# Patient Record
Sex: Female | Born: 1980
Health system: Southern US, Community
[De-identification: ages and names within clinical notes are randomized; demographics above are authoritative.]

## PROBLEM LIST (undated history)

## (undated) DIAGNOSIS — T7840XA Allergy, unspecified, initial encounter: Secondary | ICD-10-CM

## (undated) DIAGNOSIS — M4800 Spinal stenosis, site unspecified: Secondary | ICD-10-CM

## (undated) DIAGNOSIS — K2970 Gastritis, unspecified, without bleeding: Secondary | ICD-10-CM

## (undated) DIAGNOSIS — E039 Hypothyroidism, unspecified: Secondary | ICD-10-CM

## (undated) DIAGNOSIS — M109 Gout, unspecified: Secondary | ICD-10-CM

## (undated) DIAGNOSIS — E049 Nontoxic goiter, unspecified: Secondary | ICD-10-CM

## (undated) DIAGNOSIS — F32A Depression, unspecified: Secondary | ICD-10-CM

## (undated) DIAGNOSIS — G43909 Migraine, unspecified, not intractable, without status migrainosus: Secondary | ICD-10-CM

## (undated) DIAGNOSIS — F329 Major depressive disorder, single episode, unspecified: Secondary | ICD-10-CM

## (undated) DIAGNOSIS — F324 Major depressive disorder, single episode, in partial remission: Secondary | ICD-10-CM

## (undated) DIAGNOSIS — F419 Anxiety disorder, unspecified: Secondary | ICD-10-CM

## (undated) DIAGNOSIS — E66811 Obesity, class 1: Secondary | ICD-10-CM

## (undated) DIAGNOSIS — M797 Fibromyalgia: Secondary | ICD-10-CM

## (undated) DIAGNOSIS — E669 Obesity, unspecified: Secondary | ICD-10-CM

## (undated) DIAGNOSIS — M1990 Unspecified osteoarthritis, unspecified site: Secondary | ICD-10-CM

## (undated) DIAGNOSIS — K219 Gastro-esophageal reflux disease without esophagitis: Secondary | ICD-10-CM

## (undated) DIAGNOSIS — I10 Essential (primary) hypertension: Secondary | ICD-10-CM

## (undated) HISTORY — DX: Obesity, class 1: E66.811

## (undated) HISTORY — DX: Obesity, unspecified: E66.9

## (undated) HISTORY — DX: Spinal stenosis, site unspecified: M48.00

## (undated) HISTORY — DX: Depression, unspecified: F32.A

## (undated) HISTORY — DX: Essential (primary) hypertension: I10

## (undated) HISTORY — DX: Gastritis, unspecified, without bleeding: K29.70

## (undated) HISTORY — PX: INCISION AND DRAINAGE ABSCESS: SHX5864

## (undated) HISTORY — DX: Anxiety disorder, unspecified: F41.9

## (undated) HISTORY — DX: Migraine, unspecified, not intractable, without status migrainosus: G43.909

## (undated) HISTORY — DX: Nontoxic goiter, unspecified: E04.9

## (undated) HISTORY — DX: Allergy, unspecified, initial encounter: T78.40XA

## (undated) HISTORY — DX: Major depressive disorder, single episode, unspecified: F32.9

---
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
## 2008-10-16 ENCOUNTER — Telehealth: Payer: Self-pay | Admitting: Family Medicine

## 2008-10-18 ENCOUNTER — Ambulatory Visit: Payer: Self-pay | Admitting: Family Medicine

## 2008-10-18 DIAGNOSIS — F324 Major depressive disorder, single episode, in partial remission: Secondary | ICD-10-CM | POA: Insufficient documentation

## 2008-10-18 HISTORY — DX: Major depressive disorder, single episode, in partial remission: F32.4

## 2008-10-18 LAB — CONVERTED CEMR LAB
Beta hcg, urine, semiquantitative: NEGATIVE
CO2: 22 meq/L (ref 19–32)
Calcium: 9.8 mg/dL (ref 8.4–10.5)
Chloride: 105 meq/L (ref 96–112)
Creatinine, Ser: 0.66 mg/dL (ref 0.40–1.20)
HDL: 53 mg/dL (ref >39)
LDL Cholesterol: 72 mg/dL (ref 0–99)
Lymphs Abs: 2.3 10*3/uL (ref 0.7–4.0)
Monocytes Relative: 8 % (ref 3–12)
Neutro Abs: 3.3 10*3/uL (ref 1.7–7.7)
Neutrophils Relative %: 53 % (ref 43–77)
Platelets: 454 10*3/uL — ABNORMAL HIGH (ref 150–400)
RBC: 4.76 M/uL (ref 3.87–5.11)
Sodium: 141 meq/L (ref 135–145)
TSH: 0.617 microintl units/mL (ref 0.350–4.500)
Total CHOL/HDL Ratio: 2.6
Triglycerides: 77 mg/dL (ref <150)
WBC: 6.1 10*3/uL (ref 4.0–10.5)

## 2008-10-20 ENCOUNTER — Ambulatory Visit (HOSPITAL_COMMUNITY): Admission: RE | Admit: 2008-10-20 | Discharge: 2008-10-20 | Payer: Self-pay | Admitting: Family Medicine

## 2009-01-21 ENCOUNTER — Emergency Department (HOSPITAL_COMMUNITY): Admission: EM | Admit: 2009-01-21 | Discharge: 2009-01-21 | Payer: Self-pay | Admitting: Emergency Medicine

## 2009-03-07 ENCOUNTER — Encounter: Payer: Self-pay | Admitting: Family Medicine

## 2009-03-07 ENCOUNTER — Ambulatory Visit: Payer: Self-pay | Admitting: Family Medicine

## 2009-03-07 ENCOUNTER — Other Ambulatory Visit: Admission: RE | Admit: 2009-03-07 | Discharge: 2009-03-07 | Payer: Self-pay | Admitting: Family Medicine

## 2009-03-07 DIAGNOSIS — R5381 Other malaise: Secondary | ICD-10-CM | POA: Insufficient documentation

## 2009-03-07 DIAGNOSIS — M25569 Pain in unspecified knee: Secondary | ICD-10-CM

## 2009-03-07 DIAGNOSIS — R5383 Other fatigue: Secondary | ICD-10-CM

## 2009-03-07 LAB — CONVERTED CEMR LAB: Chlamydia, DNA Probe: NEGATIVE

## 2009-03-07 LAB — HM PAP SMEAR

## 2009-03-08 ENCOUNTER — Encounter: Payer: Self-pay | Admitting: Family Medicine

## 2009-03-08 ENCOUNTER — Telehealth: Payer: Self-pay | Admitting: Family Medicine

## 2009-03-08 DIAGNOSIS — F17200 Nicotine dependence, unspecified, uncomplicated: Secondary | ICD-10-CM | POA: Insufficient documentation

## 2009-03-08 LAB — CONVERTED CEMR LAB: Gardnerella vaginalis: POSITIVE — AB

## 2009-03-09 ENCOUNTER — Encounter: Payer: Self-pay | Admitting: Family Medicine

## 2009-03-21 ENCOUNTER — Ambulatory Visit (HOSPITAL_COMMUNITY): Payer: Self-pay | Admitting: Psychiatry

## 2009-03-30 ENCOUNTER — Ambulatory Visit: Payer: Self-pay | Admitting: Family Medicine

## 2009-03-30 DIAGNOSIS — T887XXA Unspecified adverse effect of drug or medicament, initial encounter: Secondary | ICD-10-CM | POA: Insufficient documentation

## 2009-04-05 ENCOUNTER — Telehealth: Payer: Self-pay | Admitting: Family Medicine

## 2009-04-23 ENCOUNTER — Ambulatory Visit: Payer: Self-pay | Admitting: Orthopedic Surgery

## 2009-04-23 DIAGNOSIS — M24469 Recurrent dislocation, unspecified knee: Secondary | ICD-10-CM

## 2009-04-25 ENCOUNTER — Telehealth: Payer: Self-pay | Admitting: Orthopedic Surgery

## 2009-04-30 ENCOUNTER — Encounter: Payer: Self-pay | Admitting: Orthopedic Surgery

## 2009-04-30 ENCOUNTER — Encounter (HOSPITAL_COMMUNITY): Admission: RE | Admit: 2009-04-30 | Discharge: 2009-05-30 | Payer: Self-pay | Admitting: Orthopedic Surgery

## 2009-06-01 ENCOUNTER — Encounter (HOSPITAL_COMMUNITY): Admission: RE | Admit: 2009-06-01 | Discharge: 2009-07-01 | Payer: Self-pay | Admitting: Orthopedic Surgery

## 2009-06-07 ENCOUNTER — Ambulatory Visit: Payer: Self-pay | Admitting: Family Medicine

## 2009-06-16 DIAGNOSIS — J309 Allergic rhinitis, unspecified: Secondary | ICD-10-CM | POA: Insufficient documentation

## 2009-07-28 HISTORY — PX: UPPER GI ENDOSCOPY: SHX6162

## 2009-07-31 ENCOUNTER — Ambulatory Visit: Payer: Self-pay | Admitting: Family Medicine

## 2009-08-01 ENCOUNTER — Encounter: Payer: Self-pay | Admitting: Orthopedic Surgery

## 2009-08-16 ENCOUNTER — Telehealth: Payer: Self-pay | Admitting: Family Medicine

## 2009-09-12 ENCOUNTER — Ambulatory Visit: Payer: Self-pay | Admitting: Family Medicine

## 2009-09-12 ENCOUNTER — Telehealth: Payer: Self-pay | Admitting: Family Medicine

## 2009-09-12 DIAGNOSIS — K5900 Constipation, unspecified: Secondary | ICD-10-CM | POA: Insufficient documentation

## 2009-09-23 ENCOUNTER — Emergency Department (HOSPITAL_COMMUNITY): Admission: EM | Admit: 2009-09-23 | Discharge: 2009-09-23 | Payer: Self-pay | Admitting: Emergency Medicine

## 2009-10-19 ENCOUNTER — Telehealth: Payer: Self-pay | Admitting: Family Medicine

## 2009-10-22 ENCOUNTER — Ambulatory Visit: Payer: Self-pay | Admitting: Family Medicine

## 2009-10-22 ENCOUNTER — Encounter (INDEPENDENT_AMBULATORY_CARE_PROVIDER_SITE_OTHER): Payer: Self-pay

## 2009-10-22 DIAGNOSIS — R519 Headache, unspecified: Secondary | ICD-10-CM | POA: Insufficient documentation

## 2009-10-22 DIAGNOSIS — R51 Headache: Secondary | ICD-10-CM

## 2009-10-29 ENCOUNTER — Telehealth: Payer: Self-pay | Admitting: Family Medicine

## 2009-10-30 ENCOUNTER — Encounter (INDEPENDENT_AMBULATORY_CARE_PROVIDER_SITE_OTHER): Payer: Self-pay

## 2009-10-30 ENCOUNTER — Ambulatory Visit: Payer: Self-pay | Admitting: Family Medicine

## 2009-10-31 ENCOUNTER — Telehealth: Payer: Self-pay | Admitting: Family Medicine

## 2009-10-31 ENCOUNTER — Ambulatory Visit: Payer: Self-pay | Admitting: Family Medicine

## 2009-10-31 LAB — CONVERTED CEMR LAB: Rapid Strep: NEGATIVE

## 2009-11-07 ENCOUNTER — Telehealth: Payer: Self-pay | Admitting: Family Medicine

## 2009-11-14 ENCOUNTER — Telehealth: Payer: Self-pay | Admitting: Family Medicine

## 2009-11-14 ENCOUNTER — Encounter (INDEPENDENT_AMBULATORY_CARE_PROVIDER_SITE_OTHER): Payer: Self-pay

## 2009-11-14 ENCOUNTER — Ambulatory Visit: Payer: Self-pay | Admitting: Family Medicine

## 2009-11-15 ENCOUNTER — Encounter: Payer: Self-pay | Admitting: Family Medicine

## 2009-11-20 ENCOUNTER — Encounter: Payer: Self-pay | Admitting: Family Medicine

## 2009-11-21 ENCOUNTER — Encounter: Payer: Self-pay | Admitting: Orthopedic Surgery

## 2009-11-23 ENCOUNTER — Telehealth (INDEPENDENT_AMBULATORY_CARE_PROVIDER_SITE_OTHER): Payer: Self-pay | Admitting: *Deleted

## 2009-11-26 ENCOUNTER — Telehealth: Payer: Self-pay | Admitting: Family Medicine

## 2009-12-03 ENCOUNTER — Ambulatory Visit: Payer: Self-pay | Admitting: Family Medicine

## 2009-12-12 ENCOUNTER — Ambulatory Visit: Payer: Self-pay | Admitting: Family Medicine

## 2009-12-13 ENCOUNTER — Encounter: Payer: Self-pay | Admitting: Family Medicine

## 2009-12-14 LAB — CONVERTED CEMR LAB
CO2: 21 meq/L (ref 19–32)
Chloride: 107 meq/L (ref 96–112)
Eosinophils Absolute: 0.1 10*3/uL (ref 0.0–0.7)
HDL: 53 mg/dL (ref >39)
LDL Cholesterol: 101 mg/dL — ABNORMAL HIGH (ref 0–99)
Lymphocytes Relative: 48 % — ABNORMAL HIGH (ref 12–46)
Lymphs Abs: 3 10*3/uL (ref 0.7–4.0)
MCV: 90.7 fL (ref 78.0–100.0)
Monocytes Relative: 4 % (ref 3–12)
Neutrophils Relative %: 47 % (ref 43–77)
Platelets: 367 10*3/uL (ref 150–400)
Potassium: 3.9 meq/L (ref 3.5–5.3)
RBC: 4.53 M/uL (ref 3.87–5.11)
Sodium: 140 meq/L (ref 135–145)
Total CHOL/HDL Ratio: 3.1
Triglycerides: 49 mg/dL (ref <150)
VLDL: 10 mg/dL (ref 0–40)
Vit D, 25-Hydroxy: 19 ng/mL — ABNORMAL LOW (ref 30–89)
WBC: 6.3 10*3/uL (ref 4.0–10.5)

## 2009-12-17 ENCOUNTER — Ambulatory Visit (HOSPITAL_COMMUNITY): Admission: RE | Admit: 2009-12-17 | Discharge: 2009-12-17 | Payer: Self-pay | Admitting: Family Medicine

## 2010-01-09 ENCOUNTER — Encounter: Payer: Self-pay | Admitting: Family Medicine

## 2010-01-24 ENCOUNTER — Telehealth: Payer: Self-pay | Admitting: Family Medicine

## 2010-01-25 ENCOUNTER — Telehealth: Payer: Self-pay | Admitting: Family Medicine

## 2010-02-22 ENCOUNTER — Telehealth: Payer: Self-pay | Admitting: Family Medicine

## 2010-03-04 ENCOUNTER — Ambulatory Visit: Payer: Self-pay | Admitting: Family Medicine

## 2010-03-28 ENCOUNTER — Other Ambulatory Visit: Admission: RE | Admit: 2010-03-28 | Discharge: 2010-03-28 | Payer: Self-pay | Admitting: Family Medicine

## 2010-03-28 ENCOUNTER — Ambulatory Visit: Payer: Self-pay | Admitting: Family Medicine

## 2010-03-28 DIAGNOSIS — K589 Irritable bowel syndrome without diarrhea: Secondary | ICD-10-CM

## 2010-03-29 ENCOUNTER — Encounter: Payer: Self-pay | Admitting: Family Medicine

## 2010-03-29 LAB — CONVERTED CEMR LAB: Gardnerella vaginalis: NEGATIVE

## 2010-04-02 ENCOUNTER — Ambulatory Visit: Payer: Self-pay | Admitting: Family Medicine

## 2010-04-02 DIAGNOSIS — G43909 Migraine, unspecified, not intractable, without status migrainosus: Secondary | ICD-10-CM

## 2010-04-03 ENCOUNTER — Encounter: Payer: Self-pay | Admitting: Family Medicine

## 2010-04-03 LAB — CONVERTED CEMR LAB: Pap Smear: NEGATIVE

## 2010-04-05 LAB — CONVERTED CEMR LAB: Helicobacter Pylori Antibody-IgG: <0.4

## 2010-04-10 ENCOUNTER — Telehealth (INDEPENDENT_AMBULATORY_CARE_PROVIDER_SITE_OTHER): Payer: Self-pay | Admitting: *Deleted

## 2010-04-12 ENCOUNTER — Encounter: Payer: Self-pay | Admitting: Family Medicine

## 2010-04-16 ENCOUNTER — Ambulatory Visit (HOSPITAL_COMMUNITY): Admission: RE | Admit: 2010-04-16 | Discharge: 2010-04-16 | Payer: Self-pay | Admitting: Family Medicine

## 2010-04-16 ENCOUNTER — Telehealth: Payer: Self-pay | Admitting: Family Medicine

## 2010-05-01 ENCOUNTER — Telehealth: Payer: Self-pay | Admitting: Family Medicine

## 2010-05-02 ENCOUNTER — Ambulatory Visit (HOSPITAL_COMMUNITY): Admission: RE | Admit: 2010-05-02 | Discharge: 2010-05-02 | Payer: Self-pay | Admitting: Family Medicine

## 2010-05-02 ENCOUNTER — Telehealth: Payer: Self-pay | Admitting: Family Medicine

## 2010-05-16 ENCOUNTER — Ambulatory Visit: Payer: Self-pay | Admitting: Gastroenterology

## 2010-05-27 ENCOUNTER — Ambulatory Visit: Payer: Self-pay | Admitting: Family Medicine

## 2010-05-28 DIAGNOSIS — K2970 Gastritis, unspecified, without bleeding: Secondary | ICD-10-CM

## 2010-05-28 HISTORY — DX: Gastritis, unspecified, without bleeding: K29.70

## 2010-05-29 ENCOUNTER — Telehealth: Payer: Self-pay | Admitting: Family Medicine

## 2010-06-03 ENCOUNTER — Ambulatory Visit (HOSPITAL_COMMUNITY): Admission: RE | Admit: 2010-06-03 | Discharge: 2010-06-03 | Payer: Self-pay | Admitting: Gastroenterology

## 2010-06-26 ENCOUNTER — Encounter: Payer: Self-pay | Admitting: Family Medicine

## 2010-06-28 ENCOUNTER — Ambulatory Visit: Payer: Self-pay | Admitting: Family Medicine

## 2010-06-28 DIAGNOSIS — B369 Superficial mycosis, unspecified: Secondary | ICD-10-CM | POA: Insufficient documentation

## 2010-07-01 ENCOUNTER — Encounter: Payer: Self-pay | Admitting: Family Medicine

## 2010-07-02 ENCOUNTER — Ambulatory Visit: Payer: Self-pay | Admitting: Family Medicine

## 2010-07-02 DIAGNOSIS — J029 Acute pharyngitis, unspecified: Secondary | ICD-10-CM | POA: Insufficient documentation

## 2010-07-16 ENCOUNTER — Encounter: Payer: Self-pay | Admitting: Family Medicine

## 2010-07-17 ENCOUNTER — Ambulatory Visit: Payer: Self-pay | Admitting: Family Medicine

## 2010-07-31 ENCOUNTER — Encounter (INDEPENDENT_AMBULATORY_CARE_PROVIDER_SITE_OTHER): Payer: Self-pay | Admitting: *Deleted

## 2010-08-12 ENCOUNTER — Ambulatory Visit
Admission: AD | Admit: 2010-08-12 | Discharge: 2010-08-12 | Payer: Self-pay | Source: Home / Self Care | Attending: Neurology | Admitting: Neurology

## 2010-08-18 ENCOUNTER — Encounter: Payer: Self-pay | Admitting: Family Medicine

## 2010-08-19 ENCOUNTER — Telehealth: Payer: Self-pay | Admitting: Family Medicine

## 2010-08-19 ENCOUNTER — Ambulatory Visit
Admission: RE | Admit: 2010-08-19 | Discharge: 2010-08-19 | Payer: Self-pay | Source: Home / Self Care | Attending: Family Medicine | Admitting: Family Medicine

## 2010-08-20 ENCOUNTER — Encounter: Payer: Self-pay | Admitting: Family Medicine

## 2010-08-22 ENCOUNTER — Ambulatory Visit
Admission: RE | Admit: 2010-08-22 | Discharge: 2010-08-22 | Payer: Self-pay | Source: Home / Self Care | Attending: Internal Medicine | Admitting: Internal Medicine

## 2010-08-22 DIAGNOSIS — K2990 Gastroduodenitis, unspecified, without bleeding: Secondary | ICD-10-CM

## 2010-08-22 DIAGNOSIS — K2970 Gastritis, unspecified, without bleeding: Secondary | ICD-10-CM | POA: Insufficient documentation

## 2010-08-27 NOTE — Progress Notes (Signed)
Summary: headache  Phone Note Call from Patient   Summary of Call: PATIENT called in and states she has a really bad headache wanted to know if something could be called in for it, [facility] patient on phone.  Initial call taken by: Curtis Sites,  February 22, 2010 11:00 AM  Follow-up for Phone Call        states she is having a migraine and she is at work and is outof her meds. Her vision is blurry and that happens when she gets a bad one. I sent in one fill of her meds and told her to make app for next week  Follow-up by: Everitt Amber LPN,  February 22, 2010 11:02 AM

## 2010-08-27 NOTE — Assessment & Plan Note (Signed)
Summary: depo  Nurse Visit   Allergies: 1)  ! Codeine 2)  ! * Soy  Medication Administration  Injection # 1:    Medication: Depo-Provera 150mg     Diagnosis: CONTRACEPTIVE MANAGEMENT (ICD-V25.09)    Route: IM    Site: LUOQ gluteus    Exp Date: 01/14    Lot #: [id number]    Mfr: GREENSTONE    Patient tolerated injection without complications    Given by: Adella Hare LPN (May 27, 2010 11:18 AM)  Orders Added: 1)  Depo-Provera 150mg  [J1055] 2)  Admin of Therapeutic Inj  intramuscular or subcutaneous [96372]   Medication Administration  Injection # 1:    Medication: Depo-Provera 150mg     Diagnosis: CONTRACEPTIVE MANAGEMENT (ICD-V25.09)    Route: IM    Site: LUOQ gluteus    Exp Date: 01/14    Lot #: [id number]    Mfr: GREENSTONE    Patient tolerated injection without complications    Given by: Adella Hare LPN (May 27, 2010 11:18 AM)  Orders Added: 1)  Depo-Provera 150mg  [J1055] 2)  Admin of Therapeutic Inj  intramuscular or subcutaneous [96372]  Appended Document: depo pt received depo with no complications

## 2010-08-27 NOTE — Letter (Signed)
Summary: Out of Work  [facility]  [address]   [place], [place] [address]   Phone: [phone]  Fax: [phone]    October 22, 2009   Employee:  AKIRE RENNERT AHMAD    To Whom It May Concern:   For Medical reasons, please excuse the above named employee from work for the following dates:  Start:   10/22/2009  End:   10/23/2009 Without Restrictions  If you need additional information, please feel free to contact our office.         Sincerely,   Milus Mallick. Lodema Hong, M.D.

## 2010-08-27 NOTE — Letter (Signed)
Summary: comprehensive clinical assessment  comprehensive clinical assessment   Imported By: Lind Guest 04/09/2010 08:49:38  _____________________________________________________________________  External Attachment:    Type:   Image     Comment:   External Document

## 2010-08-27 NOTE — Letter (Signed)
Summary: Medical record request Disability Determin  Medical record request Disability Determin   Imported By: Cammie Sickle 11/22/2009 19:08:53  _____________________________________________________________________  External Attachment:    Type:   Image     Comment:   External Document

## 2010-08-27 NOTE — Progress Notes (Signed)
Summary: [facility]  [facility]   Imported By: Lind Guest 11/02/2009 15:54:20  _____________________________________________________________________  External Attachment:    Type:   Image     Comment:   External Document

## 2010-08-27 NOTE — Progress Notes (Signed)
  Phone Note Call from Patient   Summary of Call: Butalbital/Comp-Codeine #3 not covered. Want to change or PA? [facility] Initial call taken by: Everitt Amber LPN,  January 25, 4165 4:15 PM  Follow-up for Phone Call        pls pa Follow-up by: Syliva Overman MD,  January 29, 2010 8:46 AM  Additional Follow-up for Phone Call Additional follow up Details #1::        medication was filled and picked up  Additional Follow-up by: Adella Hare LPN,  February 06, 2010 11:55 AM

## 2010-08-27 NOTE — Progress Notes (Signed)
Summary: headache  Phone Note Call from Patient   Summary of Call: needs her medicine for migraine headache sent to [facility]  on [address] in [place]   also [facility] med call back on cell [phone] has appt with [place] next  tuesday Initial call taken by: Lind Guest,  May 29, 2010 10:59 AM  Follow-up for Phone Call        pls refill any med we have been giving her for heqdache x 1 only Follow-up by: Syliva Overman MD,  May 29, 2010 7:53 PM    New/Updated Medications: FIORINAL/CODEINE #3 50-325-40-30 MG CAPS (BUTALBITAL-ASA-CAFF-CODEINE) one tab for severe migraine only max 2 caps per episode Prescriptions: FIORINAL/CODEINE #3 50-325-40-30 MG CAPS (BUTALBITAL-ASA-CAFF-CODEINE) one tab for severe migraine only max 2 caps per episode  #15 x 0   Entered by:   Adella Hare LPN   Authorized by:   Syliva Overman MD   Signed by:   Adella Hare LPN on 45/40/9811   Method used:   Printed then faxed to ...       [facility] (retail)       [address]       [place], [place]  [address]       Ph: [phone]       Fax: [phone]   RxID:   [id number]  med sent and left patient detailed message Adella Hare LPN  May 30, 2010 3:40 PM

## 2010-08-27 NOTE — Assessment & Plan Note (Signed)
Summary: f up   Vital Signs:  Patient profile:   30 year old female Menstrual status:  on Depo Height:      62 inches Weight:      148.50 pounds BMI:     27.26 O2 Sat:      98 % on Room air Pulse rate:   63 / minute Pulse rhythm:   regular Resp:     16 per minute BP sitting:   130 / 90  (left arm)  Vitals Entered By: Adella Hare LPN (June 28, 2010 10:19 AM)  Nutrition Counseling: Patient's BMI is greater than 25 and therefore counseled on weight management options.  O2 Flow:  Room air CC: follow-up visit Is Patient Diabetic? No Pain Assessment Patient in pain? no      Comments did not bring meds but did review med list   Primary Care Provider:  Lodema Hong, M.D.   CC:  follow-up visit.  History of Present Illness:  Reports  that she is doing fairly well. She married since her last visit, and reports excellent support from her spouse. She still has alot of fatigue and uncontrolled symptoms of depression however. Denies recent fever or chills. Denies sinus pressure, nasal congestion , ear pain or sore throat. Denies chest congestion, or cough productive of sputum. Denies chest pain, palpitations, PND, orthopnea or leg swelling. Denies abdominal pain, nausea, vomitting, diarrhea or constipation. Denies change in bowel movements or bloody stool. Denies dysuria , frequency, incontinence or hesitancy. Denies  joint pain, swelling, or reduced mobility. Denies headaches, vertigo, seizures.   Deshanna is still smoking 1 pPD and is unwilling to commiting to cutting back at this time     Current Medications (verified): 1)  Metoprolol Succinate 50 Mg Xr24h-Tab (Metoprolol Succinate) .... Take 1 Tablet By Mouth Once A Day 2)  Celexa 20 Mg Tabs (Citalopram Hydrobromide) .... Once Daily 3)  Ativan 0.5 Mg Tabs (Lorazepam) .... As Needed 4)  Prilosec 20 Mg Cpdr (Omeprazole) .Marland Kitchen.. 1 By Mouth 30 Minutes Prior To First Meal. 5)  Fiorinal/codeine #3 50-325-40-30 Mg Caps  (Butalbital-Asa-Caff-Codeine) .... One Tab For Severe Migraine Only Max 2 Caps Per Episode  Allergies (verified): 1)  ! Codeine 2)  ! * Soy  Past History:  Social history (including risk factors) reviewed for relevance to current acute and chronic problems.  Social History: Reviewed history from 05/16/2010 and no changes required. Disability: PENDING, WAS WORKING CUSTOMER SERVICE. Pt is currently on Medicaid.  From [place] and came to [facility] in mid 2010. Single, ONE CHILD: age 70 Current Smoker: < 1pk/day Drug use-no Regular exercise-yes Alcohol use-rare: 1x/mo Soft drinks: 2 sodas/week Sees [facility] once a month for past 6 mos. Pt has prior "trauma". Married  Review of Systems      See HPI General:  Complains of fatigue. Eyes:  Denies blurring and discharge. Derm:  Complains of itching, lesion(s), and rash; c/o itching lesion and rash on the sole of her right foot. Psych:  Complains of anxiety, depression, and mental problems; denies suicidal thoughts/plans, thoughts of violence, and unusual visions or sounds; has been treated by telepsych, has upcoming appt with therapist, which she is looking forward to. Endo:  Denies cold intolerance, excessive hunger, excessive thirst, excessive urination, and heat intolerance. Heme:  Denies abnormal bruising, bleeding, enlarge lymph nodes, and pallor. Allergy:  Denies hives or rash, itching eyes, persistent infections, and seasonal allergies.  Physical Exam  General:  Well-developed,obese,in no acute distress; alert,appropriate and cooperative throughout examination HEENT:  No facial asymmetry,  EOMI, No sinus tenderness, TM's Clear, oropharynx  pink and moist.   Chest: Clear to auscultation bilaterally.  CVS: S1, S2, No murmurs, No S3.   Abd: Soft, Nontender.  MS: Adequate ROM spine, hips, shoulders and knees.  Ext: No edema.   CNS: CN 2-12 intact, power tone and sensation normal throughout.   Skin: Iscaling of sole of right foot  Psych: Good eye  contact, normal affect.  Memory intact, not anxious or depressed appearing.    Impression & Recommendations:  Problem # 1:  DERMATOMYCOSIS (ICD-111.9) Assessment Comment Only  Orders: Dermatology Referral (Derma)  Problem # 2:  MIGRAINE WITH AURA (ICD-346.00) Assessment: Improved  Her updated medication list for this problem includes:    Metoprolol Succinate 50 Mg Xr24h-tab (Metoprolol succinate) .Marland Kitchen... Take 1 tablet by mouth once a day    Fiorinal/codeine #3 50-325-40-30 Mg Caps (Butalbital-asa-caff-codeine) ..... One tab for severe migraine only max 2 caps per episode currently seeing neurology  Problem # 3:  PHYSICAL EXAMINATION (ICD-V70.0) Assessment: Comment Only being followed by gI , has recently started a probiotic  Problem # 4:  DEPRESSION, SEVERE (ICD-311) Assessment: Improved  Her updated medication list for this problem includes:    Celexa 20 Mg Tabs (Citalopram hydrobromide) ..... Once daily    Ativan 0.5 Mg Tabs (Lorazepam) .Marland Kitchen... As needed  Problem # 5:  NICOTINE ADDICTION (ICD-305.1) Assessment: Unchanged  Encouraged smoking cessation and discussed different methods for smoking cessation.   Complete Medication List: 1)  Metoprolol Succinate 50 Mg Xr24h-tab (Metoprolol succinate) .... Take 1 tablet by mouth once a day 2)  Celexa 20 Mg Tabs (Citalopram hydrobromide) .... Once daily 3)  Ativan 0.5 Mg Tabs (Lorazepam) .... As needed 4)  Prilosec 20 Mg Cpdr (Omeprazole) .Marland Kitchen.. 1 by mouth 30 minutes prior to first meal. 5)  Fiorinal/codeine #3 50-325-40-30 Mg Caps (Butalbital-asa-caff-codeine) .... One tab for severe migraine only max 2 caps per episode  Patient Instructions: 1)  Please schedule a follow-up appointment in 3 months. 2)  It is important that you exercise regularly at least 30 minutes 5 times a week. If you develop chest pain, have severe difficulty breathing, or feel very tired , stop exercising immediately and seek medical attention. 3)  You need to  lose weight. Consider a lower calorie diet and regular exercise.  4)  you will be referred to dermatology 5)  Pls continue the meds you are on  Prescriptions: FIORINAL/CODEINE #3 50-325-40-30 MG CAPS (BUTALBITAL-ASA-CAFF-CODEINE) one tab for severe migraine only max 2 caps per episode  #15 x 0   Entered by:   Adella Hare LPN   Authorized by:   Syliva Overman MD   Signed by:   Adella Hare LPN on 09/81/1914   Method used:   Printed then faxed to ...       [facility] [address] (retail)       [address]       [place], [place]  [address]       Ph: [phone]       Fax: [phone]   RxID:   [id number] PRILOSEC 20 MG CPDR (OMEPRAZOLE) 1 by mouth 30 minutes prior to first meal.  #30 x 3   Entered by:   Adella Hare LPN   Authorized by:   Syliva Overman MD   Signed by:   Adella Hare LPN on 40/34/7425   Method used:   Electronically to  [facility]  [address] [address] (retail)       [address]       [place], [place]  [address]       Ph: [phone]       Fax: [phone]   RxID:   [id number] CELEXA 20 MG TABS (CITALOPRAM HYDROBROMIDE) once daily  #30 x 3   Entered by:   Adella Hare LPN   Authorized by:   Syliva Overman MD   Signed by:   Adella Hare LPN on 84/13/2440   Method used:   Electronically to        [facility] (retail)       [address]       [place], [place]  [address]       Ph: [phone]       Fax: [phone]   RxID:   [id number] METOPROLOL SUCCINATE 50 MG XR24H-TAB (METOPROLOL SUCCINATE) Take 1 tablet by mouth once a day  #30 x 3   Entered by:   Adella Hare LPN   Authorized by:   Syliva Overman MD   Signed by:   Adella Hare LPN on 30/16/0109   Method used:   Electronically to        [facility] (retail)       [address]       [place], [place]  [address]       Ph: [phone]       Fax: [phone]   RxID:    [id number]    Orders Added: 1)  Est. Patient Level IV [48546] 2)  Dermatology Referral [Derma]

## 2010-08-27 NOTE — Letter (Signed)
Summary: medical release  medical release   Imported By: Lind Guest 07/05/2010 10:31:49  _____________________________________________________________________  External Attachment:    Type:   Image     Comment:   External Document

## 2010-08-27 NOTE — Progress Notes (Signed)
Summary: faxed ins. papers  Phone Note Other Incoming   Caller: dr simpson Summary of Call: pls fill in #2 and 9 of  the form, then fax both sheets off to [employer] and scan in for permanene record, I have requested an appt for psych again today which i think you completed for [facility] in familiesthis info inc date and time needs to be inserted in 9, plss also enter in herchart. any ques pls ask  Initial call taken by: Syliva Overman MD,  November 14, 2009 5:43 PM  Follow-up for Phone Call        her papers where filled out and  faxed and sent to ins. company. also her appt is for 11/28/2009 3:00.  Follow-up by: Rudene Anda,  November 15, 2009 8:55 AM

## 2010-08-27 NOTE — Assessment & Plan Note (Signed)
Summary: INJECTIONS  Nurse Visit   Vital Signs:  Patient profile:   30 year old female Menstrual status:  on Depo Height:      62 inches Weight:      145.50 pounds BMI:     26.71 O2 Sat:      98 % Pulse rate:   77 / minute Resp:     16 per minute  Vitals Entered By: Everitt Amber LPN (October 30, 1608 11:14 AM) CC: Patine tin for injections for migraine pain and nausea. Sallie to refer her to [facility]   Allergies: 1)  ! Codeine  Medication Administration  Injection # 1:    Medication: Depo- Medrol 80mg     Diagnosis: MIGRAINE, CLASSICAL (ICD-346.00)    Route: IM    Site: R deltoid    Exp Date: 05/2010    Lot #: [id number]    Mfr: Pharmacia    Comments: 80mg  given     Patient tolerated injection without complications    Given by: Everitt Amber LPN (October 31, 9602 12:51 PM)  Injection # 2:    Medication: Ketorolac-Toradol 15mg     Diagnosis: MIGRAINE, CLASSICAL (ICD-346.00)    Route: IM    Site: LUOQ gluteus    Exp Date: 02/2011    Lot #: [id number]    Mfr: [email or web address]    Comments: 60mg  given     Patient tolerated injection without complications    Given by: Everitt Amber LPN (October 31, 5407 12:53 PM)  Injection # 3:    Medication: Zofran 1mg . injection    Diagnosis: NAUSEA (ICD-787.02)    Route: IM    Site: LUOQ gluteus    Exp Date: 08/2010    Lot #: [id number]    Mfr: [email or web address]    Comments: 4mg  given     Patient tolerated injection without complications    Given by: Everitt Amber LPN (October 31, 7827 12:55 PM)  Orders Added: 1)  Depo- Medrol 80mg  [J1040] 2)  Ketorolac-Toradol 15mg  [J1885] 3)  Zofran 1mg . injection [J2405] 4)  Admin of Therapeutic Inj  intramuscular or subcutaneous [96372]   Medication Administration  Injection # 1:    Medication: Depo- Medrol 80mg     Diagnosis: MIGRAINE, CLASSICAL (ICD-346.00)    Route: IM    Site: R deltoid    Exp Date: 05/2010    Lot #: [id number]    Mfr: Pharmacia    Comments: 80mg  given     Patient tolerated injection without  complications    Given by: Everitt Amber LPN (October 31, 5619 12:51 PM)  Injection # 2:    Medication: Ketorolac-Toradol 15mg     Diagnosis: MIGRAINE, CLASSICAL (ICD-346.00)    Route: IM    Site: LUOQ gluteus    Exp Date: 02/2011    Lot #: [id number]    Mfr: novaplus    Comments: 60mg  given     Patient tolerated injection without complications    Given by: Everitt Amber LPN (October 30, 3084 12:53 PM)  Injection # 3:    Medication: Zofran 1mg . injection    Diagnosis: NAUSEA (ICD-787.02)    Route: IM    Site: LUOQ gluteus    Exp Date: 08/2010    Lot #: [id number]    Mfr: novaplus    Comments: 4mg  given     Patient tolerated injection without complications    Given by: Everitt Amber LPN (October 30, 6293 12:55 PM)  Orders Added: 1)  Depo- Medrol 80mg  [J1040] 2)  Ketorolac-Toradol 15mg  [J1885] 3)  Zofran 1mg . injection [J2405] 4)  Admin of Therapeutic Inj  intramuscular or subcutaneous [[address]]

## 2010-08-27 NOTE — Assessment & Plan Note (Signed)
Summary: left thigh pain- room 3   Vital Signs:  Patient profile:   30 year old female Menstrual status:  on Depo Height:      62 inches Weight:      146.75 pounds BMI:     26.94 O2 Sat:      98 % on Room air Pulse rate:   119 / minute Resp:     16 per minute BP sitting:   142 / 83  (left arm)  Vitals Entered By: Adella Hare LPN (September 12, 2009 3:51 PM)  O2 Flow:  Room air CC: pain and bruising on left thigh Is Patient Diabetic? No Pain Assessment Patient in pain? yes     Location: left thigh Intensity: 9 Type: aching Onset of pain  Intermittent   Primary Care Provider:  Syliva Overman MD  CC:  pain and bruising on left thigh.  History of Present Illness: Pt reports sore thrt x 2-3 days.  "Doesn't feel like strep."  Also some nasal drainage - clear phlegm.  Little cough but due to PND.  Chest does not feel congested.  Has felt feverish off & on. Used an otc cold medication today with little help.  Also noticed a bruise on her Lt thigh yesterday.  Does not remember any trauma to the area.  Today when sitting noticed sharp pains above the bruise & towards her buttock.  No prev hx of unexplained bruising.  Pt also requests another Rx of Fiorinal with Codeine.  She received a Rx 09/07/09 but states that she has been using/needing more of this due to an increase in her migraine frequency.  Also c/o abd bloating x approx 1 mos.  Hx of constipation probs.   Current Medications (verified): 1)  Metoprolol Succinate 50 Mg Xr24h-Tab (Metoprolol Succinate) .... Take 1 Tablet By Mouth Once A Day 2)  Fiorinal/codeine #3 50-325-40-30 Mg Caps (Butalbital-Asa-Caff-Codeine) .... Take 1 Tablet For Seevre Migraine Headache Onl;y, Max 2 Tabs Per Episode 3)  Seroquel Xr 50 Mg Xr24h-Tab (Quetiapine Fumarate) .... Take 1 Tablet By Mouth Once A Day  Allergies (verified): 1)  ! Codeine PMH-FH-SH reviewed for relevance  Review of Systems General:  Complains of chills; denies  fever. ENT:  Complains of nasal congestion, postnasal drainage, and sore throat; denies earache and sinus pressure. CV:  Denies chest pain or discomfort and palpitations. Resp:  Complains of cough; denies shortness of breath and sputum productive. GI:  Complains of constipation; denies abdominal pain and bloody stools. GU:  Denies abnormal vaginal bleeding. MS:  Denies joint pain, joint redness, joint swelling, low back pain, cramps, muscle weakness, and stiffness. Neuro:  Complains of headaches; denies numbness and tingling. Heme:  Complains of enlarge lymph nodes; noticed 1 swollen lymph node in Lt neck. Allergy:  Denies hives or rash, itching eyes, persistent infections, seasonal allergies, and sneezing.  Physical Exam  General:  Well-developed,well-nourished,in no acute distress; alert,appropriate and cooperative throughout examination Head:  Normocephalic and atraumatic without obvious abnormalities. No apparent alopecia or balding. Ears:  External ear exam shows no significant lesions or deformities.  Otoscopic examination reveals clear canals, tympanic membranes are intact bilaterally without bulging, retraction, inflammation or discharge. Hearing is grossly normal bilaterally. Nose:  External nasal examination shows no deformity or inflammation. Nasal mucosa are pink and moist without lesions or exudates. Mouth:  good dentition, no postnasal drip, no aphthous ulcers, no tongue abnormalities, no petechiae, and pharyngeal erythema.  Tonsils 1+ with pustules Neck:  No deformities, masses, or tenderness  noted. Lungs:  Normal respiratory effort, chest expands symmetrically. Lungs are clear to auscultation, no crackles or wheezes. Heart:  Normal rate and regular rhythm. S1 and S2 normal without gallop, murmur, click, rub or other extra sounds. Pulses:  L femoral normal, L posterior tibial normal, and L dorsalis pedis normal.   Extremities:  No clubbing, cyanosis, edema, or deformity noted  with normal full range of motion of all joints.   Neurologic:  sensation intact to light touch and gait normal.   Skin:  Lt lateral mid thigh eccymosis noted approx 2 cm diameter, +TTP  Cervical Nodes:  1 Lt anterior cervical node noted, smooth & mobile. + TTP Psych:  Cognition and judgment appear intact. Alert and cooperative with normal attention span and concentration. No apparent delusions, illusions, hallucinations   Impression & Recommendations:  Problem # 1:  ACUTE TONSILLITIS (ICD-463)  Orders: Rapid Strep (25366)  Problem # 2:  CONTUSION, LEG (ICD-924.5) Reassurance not a blood clot.  Use heat to area.  May use ibuprofen as needed for discomfort.   Problem # 3:  MIGRAINE, CLASSICAL (ICD-346.00)  Her updated medication list for this problem includes:    Metoprolol Succinate 50 Mg Xr24h-tab (Metoprolol succinate) .Marland Kitchen... Take 1 tablet by mouth once a day    Fiorinal/codeine #3 50-325-40-30 Mg Caps (Butalbital-asa-caff-codeine) .Marland Kitchen... Take 1 tablet for severe migraine headache onl;y, max 2 tabs per episode   Discussed with pt since she just received a Rx refill of the Fiorinal with Codeine I would not be able to refill this for her today. Also since she is using more than the allowed amount of 4/mos, she will need to discuss her increased use & future refills with Dr. Lodema Hong.   Problem # 4:  CONSTIPATION (ICD-564.00)  Discussed dietary fiber measures and increased water intake.   Complete Medication List: 1)  Metoprolol Succinate 50 Mg Xr24h-tab (Metoprolol succinate) .... Take 1 tablet by mouth once a day 2)  Fiorinal/codeine #3 50-325-40-30 Mg Caps (Butalbital-asa-caff-codeine) .... Take 1 tablet for severe migraine headache onl;y, max 2 tabs per episode 3)  Seroquel Xr 50 Mg Xr24h-tab (Quetiapine fumarate) .... Take 1 tablet by mouth once a day 4)  Amoxicillin 875 Mg Tabs (Amoxicillin) .Marland Kitchen.. 1 bid  Other Orders: Urine Pregnancy Test  (44034) Depo-Provera 150mg   (J1055) Admin of Therapeutic Inj  intramuscular or subcutaneous (74259)  Patient Instructions: 1)  Please schedule a follow-up appointment as needed. 2)  Get plenty of rest, drink lots of clear liquids, and use Tylenol or Ibuprofen for fever and comfort. Return in 7-10 days if you're not better:sooner if you're feeling worse. 3)  Take 400-600mg  of Ibuprofen (Advil, Motrin) with food every 4-6 hours as needed for relief of pain or comfort of fever. 4)  You will need to discuss your Fiorinal usage with Dr Lodema Hong in the future.   5)  Use warm compresses to the bruised area on your thigh.  This should gradually improve over time.  Prescriptions: AMOXICILLIN 875 MG TABS (AMOXICILLIN) 1 bid  #20 x 0   Entered and Authorized by:   Esperanza Sheets PA   Signed by:   Adella Hare LPN on 56/38/7564   Method used:   Electronically to        [facility] (retail)       [address]       [place], [place]  [address]       Ph: [phone]  Fax: [phone]   RxID:   [id number]    Medication Administration  Injection # 1:    Medication: Depo-Provera 150mg     Diagnosis: CONTRACEPTIVE MANAGEMENT (ICD-V25.09)    Route: IM    Site: RUOQ gluteus    Exp Date: 8/13    Lot #: [id number]    Mfr: [email or web address]    Patient tolerated injection without complications    Given by: Adella Hare LPN (September 12, 2009 4:40 PM)  Orders Added: 1)  Rapid Strep [30865] 2)  Urine Pregnancy Test  [81025] 3)  Est. Patient Level IV [78469] 4)  Depo-Provera 150mg  [J1055] 5)  Admin of Therapeutic Inj  intramuscular or subcutaneous [62952]

## 2010-08-27 NOTE — Progress Notes (Signed)
Summary: waive copay  Phone Note Call from Patient   Summary of Call: wants to see if doc can get her copay waived [phone] Initial call taken by: Rudene Anda,  November 23, 2009 8:40 AM  Follow-up for Phone Call        pls lety her know the office policy on copays  Follow-up by: Syliva Overman MD,  Nov 25, 2009 7:37 PM

## 2010-08-27 NOTE — Assessment & Plan Note (Signed)
Summary: HEADACHES   Vital Signs:  Patient profile:   30 year old female Menstrual status:  on Depo Height:      62 inches Weight:      145.25 pounds BMI:     26.66 O2 Sat:      97 % Pulse rate:   91 / minute Pulse rhythm:   regular Resp:     16 per minute BP sitting:   122 / 80  (left arm) Cuff size:   regular  Vitals Entered By: Everitt Amber LPN (October 22, 2009 11:51 AM)  Nutrition Counseling: Patient's BMI is greater than 25 and therefore counseled on weight management options. CC: increase in frequency of headaches, having a slight one today    Primary Care Provider:  Syliva Overman MD  CC:  increase in frequency of headaches and having a slight one today .  History of Present Illness: pt has noted increased headache frequency since end January, she has recently started working in January. She states the job is stressful , she has to drive 1 hr to wk and also to take her baby  to her mOm, she is also in classes.She deniesvision disturbance, localised weakness or numbness. She deneis uncontrolledallergysymptoms, head or chest congestion. She denies dysuria or frequency. She reports uncontrolled depression and wantsto see therapist. She is not suicidal orhomicidal.She notes inceased forgwetfullness. She ios still trying to quit smoking.  Current Medications (verified): 1)  Metoprolol Succinate 50 Mg Xr24h-Tab (Metoprolol Succinate) .... Take 1 Tablet By Mouth Once A Day 2)  Fiorinal/codeine #3 50-325-40-30 Mg Caps (Butalbital-Asa-Caff-Codeine) .... Take 1 Tablet For Severe Migraine Headache Onl;y, Max 2 Tabs Per Episode  Allergies (verified): 1)  ! Codeine  Physical Exam  General:  Well-developed,well-nourished,in no acute distress; alert,appropriate and cooperative throughout examinationPt in pain HEENT: No facial asymmetry,  EOMI, No sinus tenderness, TM's Clear, oropharynx  pink and moist.   Chest: Clear to auscultation bilaterally.  CVS: S1, S2, No murmurs, No  S3.   Abd: Soft, Nontender.  MS: Adequate ROM spine, hips, shoulders and knees.  Ext: No edema.   CNS: CN 2-12 intact, power tone and sensation normal throughout.   Skin: Intact, no visible lesions or rashes.  Psych: Good eye contact, normal affect.  Memory intact, not anxious or depressed appearing.    Impression & Recommendations:  Problem # 1:  HEADACHE (ICD-784.0) Assessment Deteriorated  Her updated medication list for this problem includes:    Metoprolol Succinate 50 Mg Xr24h-tab (Metoprolol succinate) .Marland Kitchen... Take 1 tablet by mouth once a day    Fiorinal/codeine #3 50-325-40-30 Mg Caps (Butalbital-asa-caff-codeine) .Marland Kitchen... Take 1 tablet for severe migraine headache onl;y, max 2 tabs per episode  Orders: Depo- Medrol 80mg  (J1040) Ketorolac-Toradol 15mg  (V5643) Admin of Therapeutic Inj  intramuscular or subcutaneous (32951) Neurology Referral (Neuro)  Problem # 2:  NICOTINE ADDICTION (ICD-305.1) Assessment: Unchanged  Encouraged smoking cessation and discussed different methods for smoking cessation.   Problem # 3:  DEPRESSION, SEVERE (ICD-311) Assessment: Unchanged  Her updated medication list for this problem includes:    Citalopram Hydrobromide 20 Mg Tabs (Citalopram hydrobromide) .Marland Kitchen... Take 1 tablet by mouth once a day  Orders: Psychiatric Referral (Psych)  Complete Medication List: 1)  Metoprolol Succinate 50 Mg Xr24h-tab (Metoprolol succinate) .... Take 1 tablet by mouth once a day 2)  Fiorinal/codeine #3 50-325-40-30 Mg Caps (Butalbital-asa-caff-codeine) .... Take 1 tablet for severe migraine headache onl;y, max 2 tabs per episode 3)  Citalopram Hydrobromide 20 Mg Tabs (Citalopram  hydrobromide) .... Take 1 tablet by mouth once a day  Patient Instructions: 1)  f/u in 4 to 6 weeks. 2)  You are getting injections for headache and a prednisone dose pack is sent in. 3)  pls consider ways to deal with stress relief.  Prescriptions: CITALOPRAM HYDROBROMIDE 20 MG TABS  (CITALOPRAM HYDROBROMIDE) Take 1 tablet by mouth once a day  #30 x 2   Entered and Authorized by:   Syliva Overman MD   Signed by:   Syliva Overman MD on 10/22/2009   Method used:   Electronically to        [facility] (retail)       [address]       [place], [place]  [address]       Ph: [phone]       Fax: [phone]   RxID:   [id number] PREDNISONE (PAK) 5 MG TABS (PREDNISONE) Use as directed  #21 x 0   Entered and Authorized by:   Syliva Overman MD   Signed by:   Syliva Overman MD on 10/22/2009   Method used:   Electronically to        [facility] (retail)       [address]       [place], [place]  [address]       Ph: [phone]       Fax: [phone]   RxID:   [id number]    Medication Administration  Injection # 1:    Medication: Depo- Medrol 80mg     Diagnosis: HEADACHE (ICD-784.0)    Route: IM    Site: RUOQ gluteus    Exp Date: 05/2010    Lot #: [id number]    Mfr: Pharmacia    Comments: 80mg  given     Patient tolerated injection without complications    Given by: Everitt Amber LPN (October 22, 2009 1:33 PM)  Injection # 2:    Medication: Ketorolac-Toradol 15mg     Diagnosis: HEADACHE (ICD-784.0)    Route: IM    Site: LUOQ gluteus    Exp Date: 02/2011    Lot #: [id number]    Mfr: [email or web address]    Comments: 60mg  given     Patient tolerated injection without complications    Given by: Everitt Amber LPN (October 22, 2009 1:34 PM)  Orders Added: 1)  Est. Patient Level IV [[address]] 2)  Depo- Medrol 80mg  [J1040] 3)  Ketorolac-Toradol 15mg  [J1885] 4)  Admin of Therapeutic Inj  intramuscular or subcutaneous [96372] 5)  Neurology Referral [Neuro] 6)  Psychiatric Referral [Psych]

## 2010-08-27 NOTE — Progress Notes (Signed)
Summary: refills  Phone Note Call from Patient   Summary of Call: needs a refill on mirgraine meds. [phone]   [facility] [place]  Initial call taken by: Rudene Anda,  January 24, 2010 8:11 AM  Follow-up for Phone Call        pls refill meds if due Follow-up by: Syliva Overman MD,  January 24, 2010 11:48 AM    Prescriptions: FIORINAL/CODEINE #3 50-325-40-30 MG CAPS (BUTALBITAL-ASA-CAFF-CODEINE) take 1 tablet for severe migraine headache onl;y, max 2 tabs per episode  #15 x 1   Entered by:   Adella Hare LPN   Authorized by:   Syliva Overman MD   Signed by:   Adella Hare LPN on 55/73/2202   Method used:   Printed then faxed to ...       [facility] (retail)       [address]       [place], [place]  [address]       Ph: [phone]       Fax: [phone]   RxID:   [id number]

## 2010-08-27 NOTE — Progress Notes (Signed)
Summary: TONSILS  Phone Note Call from Patient   Summary of Call: SHE TALKED TO DR. LAST NIGHT AND JUST REALIZED THAT SHE FORGOT TO TELL HER THAT HER TONSILS HAVE BEEN INFLAMMED FOR 6 WEEKS AND HAVE GOTTEN [facility] CALL BACK AT [phone] Initial call taken by: Lind Guest,  October 31, 2009 8:08 AM  Follow-up for Phone Call        appt today at 10 with dawn Follow-up by: Adella Hare LPN,  October 31, 2009 8:48 AM

## 2010-08-27 NOTE — Assessment & Plan Note (Signed)
Summary: office visit   Vital Signs:  Patient profile:   30 year old female Menstrual status:  on Depo Height:      62 inches Weight:      141.75 pounds BMI:     26.02 O2 Sat:      97 % Pulse rate:   73 / minute Pulse rhythm:   regular Resp:     16 per minute BP sitting:   120 / 84  (left arm) Cuff size:   regular  Vitals Entered By: Everitt Amber LPN (Dec 12, 2009 9:43 AM)  Nutrition Counseling: Patient's BMI is greater than 25 and therefore counseled on weight management options. CC: Follow up visit and having sinus pressure around eyes, right gland in neck swollen, some low grade fever   Primary Care Provider:  Syliva Overman MD  CC:  Follow up visit and having sinus pressure around eyes, right gland in neck swollen, and some low grade fever.  History of Present Illness: Pt has a 3 day h/o maxillary sinus pressure, sore neck glands and fever, otc meds not helping and symptoms have worsened. She reports continued headache sand depression. She is still unemployerde, and tryiong to get medicaid, unfortunately though she recently attempted to [facility] I think that her mental health precludes this. She is not quite as depressed as before ,a nd her niece no longerlives with her, family instability is still a big problem  Her smoking has worsened.   Preventive Screening-Counseling & Management  Alcohol-Tobacco     Smoking Cessation Counseling: yes  Current Medications (verified): 1)  Metoprolol Succinate 50 Mg Xr24h-Tab (Metoprolol Succinate) .... Take 1 Tablet By Mouth Once A Day 2)  Fiorinal/codeine #3 50-325-40-30 Mg Caps (Butalbital-Asa-Caff-Codeine) .... Take 1 Tablet For Severe Migraine Headache Onl;y, Max 2 Tabs Per Episode  Allergies (verified): 1)  ! Codeine  Review of Systems      See HPI General:  Complains of chills, fatigue, fever, loss of appetite, and malaise. Eyes:  Denies blurring and discharge. ENT:  Complains of postnasal drainage and sinus pressure;  denies sore throat. CV:  Denies chest pain or discomfort, palpitations, and swelling of feet. Resp:  Denies cough and sputum productive. GI:  Denies abdominal pain, constipation, diarrhea, nausea, and vomiting. GU:  Denies dysuria and urinary frequency. MS:  Denies joint pain and stiffness. Neuro:  Complains of headaches; denies seizures and sensation of room spinning. Psych:  Complains of anxiety, depression, easily tearful, and mental problems; denies suicidal thoughts/plans, thoughts of violence, and unusual visions or sounds. Endo:  Denies cold intolerance, excessive thirst, excessive urination, and heat intolerance. Heme:  Denies abnormal bruising and bleeding. Allergy:  Complains of seasonal allergies.  Physical Exam  General:  alert and well-nourished.  , in no C/p distress. HEENT: No facial asymmetry,  EOMI,frontal and left maxillary sinus tenderness, TM's Clear, oropharynx  pink and moist. goiter  Chest: Clear to auscultation bilaterally.  CVS: S1, S2, No murmurs, No S3.   Abd: Soft, Nontender.  MS: Adequate ROM spine, hips, shoulders and knees.  Ext: No edema.   CNS: CN 2-12 intact, power tone and sensation normal throughout.   Skin: Intact, no visible lesions or rashes.  Psych: good eye contact , not depressed appearing butanxious   Impression & Recommendations:  Problem # 1:  NICOTINE ADDICTION (ICD-305.1) Assessment Unchanged  Encouraged smoking cessation and discussed different methods for smoking cessation.   Problem # 2:  OTHER ACUTE SINUSITIS (ICD-461.8) Assessment: Comment Only  Her updated  medication list for this problem includes:    Penicillin V Potassium 500 Mg Tabs (Penicillin v potassium) .Marland Kitchen... Take 1 tablet by mouth three times a day  Problem # 3:  HEADACHE (ICD-784.0) Assessment: Unchanged  Her updated medication list for this problem includes:    Metoprolol Succinate 50 Mg Xr24h-tab (Metoprolol succinate) .Marland Kitchen... Take 1 tablet by mouth once a day     Fiorinal/codeine #3 50-325-40-30 Mg Caps (Butalbital-asa-caff-codeine) .Marland Kitchen... Take 1 tablet for severe migraine headache onl;y, max 2 tabs per episode  Problem # 4:  DEPRESSION, SEVERE (ICD-311) Assessment: Unchanged  The following medications were removed from the medication list:    Citalopram Hydrobromide 20 Mg Tabs (Citalopram hydrobromide) .Marland Kitchen... Take 1 tablet by mouth once a day  Problem # 5:  GOITER, UNSPECIFIED (ICD-240.9) Assessment: Comment Only  Orders: Radiology Referral (Radiology)  Complete Medication List: 1)  Metoprolol Succinate 50 Mg Xr24h-tab (Metoprolol succinate) .... Take 1 tablet by mouth once a day 2)  Fiorinal/codeine #3 50-325-40-30 Mg Caps (Butalbital-asa-caff-codeine) .... Take 1 tablet for severe migraine headache onl;y, max 2 tabs per episode 3)  Penicillin V Potassium 500 Mg Tabs (Penicillin v potassium) .... Take 1 tablet by mouth three times a day 4)  Fluconazole 150 Mg Tabs (Fluconazole) .... Take 1 tablet by mouth once a day 5)  Vitamin D (ergocalciferol) 50000 Unit Caps (Ergocalciferol) .... One cap by mouth every week  Other Orders: T-Basic Metabolic Panel [id number]) T-Lipid Profile [id number]) T-CBC w/Diff [id number]) T-Vitamin D (25-Hydroxy) [phone])  Patient Instructions: 1)  F/U in 3.5 months 2)  You are being  treated for sinusitis, meds are beng sent. 3)  BMP prior to visit, ICD-9: 4)  Lipid Panel prior to visit, ICD-9: 5)  CBC w/ Diff prior to visit, ICD-9:   fasting today 6)  Vit d 7)  Tobacco is very bad for your health and your loved ones! You Should stop smoking!. 8)  Stop Smoking Tips: Choose a Quit date. Cut down before the Quit date. decide what you will do as a substitute when you feel the urge to smoke(gum,toothpick,exercise). 9)  It is important that you exercise regularly at least 20 minutes 5 times a week. If you develop chest pain, have severe difficulty breathing, or feel very tired , stop exercising immediately  and seek medical attention. 10)  You need to lose weight. Consider a lower calorie diet and regular exercise.  11)  Pls call [facility] about local counselling Prescriptions: VITAMIN D (ERGOCALCIFEROL) 50000 UNIT CAPS (ERGOCALCIFEROL) one cap by mouth every week  #4 x 4   Entered by:   Adella Hare LPN   Authorized by:   Syliva Overman MD   Signed by:   Adella Hare LPN on 01/60/1093   Method used:   Electronically to        [facility] (retail)       [address]       [place], [place]  [address]       Ph: [phone]       Fax: [phone]   RxID:   [id number] FLUCONAZOLE 150 MG TABS (FLUCONAZOLE) Take 1 tablet by mouth once a day  #1 x 0   Entered by:   Everitt Amber LPN   Authorized by:   Syliva Overman MD   Signed by:   Everitt Amber LPN on 85/46/2703   Method used:   Electronically to  [facility] (retail)       [address]       [place], [place]  [address]       Ph: [phone]       Fax: [phone]   RxID:   [id number] PENICILLIN V POTASSIUM 500 MG TABS (PENICILLIN V POTASSIUM) Take 1 tablet by mouth three times a day  #30 x 0   Entered and Authorized by:   Syliva Overman MD   Signed by:   Syliva Overman MD on 12/12/2009   Method used:   Electronically to        [facility] (retail)       [address]       [place], [place]  [address]       Ph: [phone]       Fax: [phone]   RxID:   [id number]

## 2010-08-27 NOTE — Letter (Signed)
Summary: Letter  Letter   Imported By: Lind Guest 04/09/2010 08:49:58  _____________________________________________________________________  External Attachment:    Type:   Image     Comment:   External Document

## 2010-08-27 NOTE — Progress Notes (Signed)
Summary: refill on migraine  Phone Note Call from Patient   Summary of Call: would like to get [facility]. [phone]   [phone] Initial call taken by: Rudene Anda,  May 01, 2010 3:03 PM  Follow-up for Phone Call        can she have refill of this med? Follow-up by: Everitt Amber LPN,  May 01, 2010 3:12 PM  Additional Follow-up for Phone Call Additional follow up Details #1::        pls refill x 1 , she should be seeing a neurologist about her migraine, let her know neurology will erx for migraine meds moving fwd, ensure she has/is aware of appt Additional Follow-up by: Syliva Overman MD,  May 01, 2010 3:59 PM    Additional Follow-up for Phone Call Additional follow up Details #2::    She is seeing neurology but she owes a small fee with him and can't go back until its paid off but she is working on it. Aware of refill sent in Follow-up by: Everitt Amber LPN,  May 01, 2010 4:18 PM  Prescriptions: FIORINAL/CODEINE #3 50-325-40-30 MG CAPS (BUTALBITAL-ASA-CAFF-CODEINE) take 1 tablet for severe migraine headache onl;y, max 2 tabs per episode  #15 x 0   Entered by:   Everitt Amber LPN   Authorized by:   Syliva Overman MD   Signed by:   Everitt Amber LPN on 29/56/2130   Method used:   Printed then faxed to ...       [facility] (retail)       [address]       [place], [place]  [address]       Ph: [phone]       Fax: [phone]   RxID:   [id number]

## 2010-08-27 NOTE — Miscellaneous (Signed)
Summary: PT Clinical evaluation  PT Clinical evaluation   Imported By: Jacklynn Ganong 08/08/2009 09:25:47  _____________________________________________________________________  External Attachment:    Type:   Image     Comment:   External Document

## 2010-08-27 NOTE — Progress Notes (Signed)
Summary: speak with nurse  Phone Note Call from Patient   Summary of Call: having pain in her buttocks and down, has a bruise on the left leg. its really big,. but doesn't remember running into anything.  [phone] Initial call taken by: Rudene Anda,  September 12, 2009 2:39 PM  Follow-up for Phone Call        advised patient to come in today at 4 pm Follow-up by: Adella Hare LPN,  September 12, 2009 2:54 PM

## 2010-08-27 NOTE — Assessment & Plan Note (Signed)
Summary: swollen tonsils- room 1   Vital Signs:  Patient profile:   30 year old female Menstrual status:  on Depo Height:      62 inches Weight:      144.25 pounds BMI:     26.48 O2 Sat:      98 % on Room air Pulse rate:   80 / minute Resp:     16 per minute BP sitting:   110 / 80  (left arm)  Vitals Entered By: Adella Hare LPN (October 31, 1189 10:15 AM) CC: swollen tonsils Is Patient Diabetic? No Pain Assessment Patient in pain? no        Referring Provider:  Dr. Syliva Overman Primary Provider:  Syliva Overman MD  CC:  swollen tonsils.  History of Present Illness: Pt presents today with c/o another infection in her tonsils. She states that the redness & pain improved after the last course of antibiotics but the swelling never went away. She noticed increased pain again last night & saw white pus pockets on her tonsils. Her Lt lymph node feels swollen too. No fever. Little nasal congestion.  Pt recently had a migraine.  Came in yesterday for injections for this.  States her migraine is better today. Is feeling a little run-down/tired today.  Vision seems not quite right.  Like she has to work to focus.  Not blurred, dble or flashing lights.    Current Medications (verified): 1)  Metoprolol Succinate 50 Mg Xr24h-Tab (Metoprolol Succinate) .... Take 1 Tablet By Mouth Once A Day 2)  Fiorinal/codeine #3 50-325-40-30 Mg Caps (Butalbital-Asa-Caff-Codeine) .... Take 1 Tablet For Severe Migraine Headache Onl;y, Max 2 Tabs Per Episode 3)  Citalopram Hydrobromide 20 Mg Tabs (Citalopram Hydrobromide) .... Take 1 Tablet By Mouth Once A Day  Allergies (verified): 1)  ! Codeine  Past History:  Past medical history reviewed for relevance to current acute and chronic problems.  Past Medical History: Reviewed history from 04/23/2009 and no changes required. Hypertension x 6 years Anxiety disorder x 6 years Depression Migrainesx 6 years thyroid/goiter  Review of  Systems General:  Denies chills and fever. ENT:  Complains of nasal congestion and sore throat; denies earache, postnasal drainage, and sinus pressure. Resp:  Denies cough. Heme:  Complains of enlarge lymph nodes.  Physical Exam  General:  Well-developed,well-nourished,in no acute distress; alert,appropriate and cooperative throughout examination Head:  Normocephalic and atraumatic without obvious abnormalities. No apparent alopecia or balding. Ears:  External ear exam shows no significant lesions or deformities.  Otoscopic examination reveals clear canals, tympanic membranes are intact bilaterally without bulging, retraction, inflammation or discharge. Hearing is grossly normal bilaterally. Nose:  External nasal examination shows no deformity or inflammation. Nasal mucosa are pink and moist without lesions or exudates. Mouth:  good dentition, no posterior lymphoid hypertrophy, no postnasal drip, no leukoplakia, and tonsil hypertropied 1+ bilat, with erythema. Neck:  No deformities, masses, or tenderness noted. Lungs:  Normal respiratory effort, chest expands symmetrically. Lungs are clear to auscultation, no crackles or wheezes. Heart:  Normal rate and regular rhythm. S1 and S2 normal without gallop, murmur, click, rub or other extra sounds. Cervical Nodes:  one 1+ tender Lt tonsilar node. remainder of cervical nodes neg Psych:  Cognition and judgment appear intact. Alert and cooperative with normal attention span and concentration. No apparent delusions, illusions, hallucinations   Impression & Recommendations:  Problem # 1:  ACUTE TONSILLITIS (ICD-463) Assessment New  Orders: Rapid Strep (478[address]) ENT Referral (ENT)  Problem #  2:  HEADACHE (ICD-784.0) Assessment: Improved  Her updated medication list for this problem includes:    Metoprolol Succinate 50 Mg Xr24h-tab (Metoprolol succinate) .Marland Kitchen... Take 1 tablet by mouth once a day    Fiorinal/codeine #3 50-325-40-30 Mg Caps  (Butalbital-asa-caff-codeine) .Marland Kitchen... Take 1 tablet for severe migraine headache onl;y, max 2 tabs per episode  Complete Medication List: 1)  Metoprolol Succinate 50 Mg Xr24h-tab (Metoprolol succinate) .... Take 1 tablet by mouth once a day 2)  Fiorinal/codeine #3 50-325-40-30 Mg Caps (Butalbital-asa-caff-codeine) .... Take 1 tablet for severe migraine headache onl;y, max 2 tabs per episode 3)  Citalopram Hydrobromide 20 Mg Tabs (Citalopram hydrobromide) .... Take 1 tablet by mouth once a day 4)  Cefprozil 250 Mg Tabs (Cefprozil) .... Take 1 two times a day x 10 days  Patient Instructions: 1)  Please schedule a follow-up appointment as needed. 2)  Use Tylenol or ibuprofen as needed for discomfort. 3)  I have prescribed an antibiotics for you to take twice a day. 4)  I have referred you to an ENT Dr for your tonsils. Prescriptions: CEFPROZIL 250 MG TABS (CEFPROZIL) take 1 two times a day x 10 days  #20 x 0   Entered and Authorized by:   Esperanza Sheets PA   Signed by:   Esperanza Sheets PA on 10/31/2009   Method used:   Electronically to        [facility] (retail)       [address]       [place], [place]  [address]       Ph: [phone]       Fax: [phone]   RxID:   [id number]   Laboratory Results    Other Tests  Rapid Strep: negative

## 2010-08-27 NOTE — Assessment & Plan Note (Signed)
Summary: depo  Nurse Visit   Allergies: 1)  ! Codeine  Medication Administration  Injection # 1:    Medication: Depo-Provera 150mg     Diagnosis: CONTRACEPTIVE MANAGEMENT (ICD-V25.09)    Route: IM    Site: LUOQ gluteus    Exp Date: 8/13    Lot #: [id number]    Mfr: [email or web address]    Patient tolerated injection without complications    Given by: Adella Hare LPN (Dec 03, 1608 11:24 AM)  Orders Added: 1)  Depo-Provera 150mg  [J1055] 2)  Admin of Therapeutic Inj  intramuscular or subcutaneous [96372]   Medication Administration  Injection # 1:    Medication: Depo-Provera 150mg     Diagnosis: CONTRACEPTIVE MANAGEMENT (ICD-V25.09)    Route: IM    Site: LUOQ gluteus    Exp Date: 8/13    Lot #: [id number]    Mfr: [email or web address]    Patient tolerated injection without complications    Given by: Adella Hare LPN (Dec 04, 9602 11:24 AM)  Orders Added: 1)  Depo-Provera 150mg  [J1055] 2)  Admin of Therapeutic Inj  intramuscular or subcutaneous [54098]

## 2010-08-27 NOTE — Progress Notes (Signed)
Summary: MEDICINE  Phone Note Call from Patient   Summary of Call: DOES NOT SEE THE PHY. UNTIL THE 26 OF JAN. AND NEEDS SAMPLES OF SERQ. OR A RX SENT TO [facility]  CALL LET HER KNOW AT [phone] Initial call taken by: Lind Guest,  August 16, 2009 1:59 PM  Follow-up for Phone Call        rx sent, called patient, left message Follow-up by: Worthy Keeler LPN,  August 16, 2009 2:09 PM  Additional Follow-up for Phone Call Additional follow up Details #1::        patient aware Additional Follow-up by: Worthy Keeler LPN,  August 16, 2009 2:13 PM    Prescriptions: SEROQUEL XR 50 MG XR24H-TAB (QUETIAPINE FUMARATE) Take 1 tablet by mouth once a day  #30 x 2   Entered by:   Worthy Keeler LPN   Authorized by:   Syliva Overman MD   Signed by:   Worthy Keeler LPN on 16/04/9603   Method used:   Electronically to        [facility] (retail)       [address]       [place], [place]  [address]       Ph: [phone]       Fax: [phone]   RxID:   [id number]

## 2010-08-27 NOTE — Assessment & Plan Note (Signed)
Summary: physical   Vital Signs:  Patient profile:   30 year old female Menstrual status:  on Depo Height:      62 inches Weight:      145.75 pounds BMI:     26.75 O2 Sat:      96 % Pulse rate:   66 / minute Pulse rhythm:   regular Resp:     16 per minute BP sitting:   122 / 90 Cuff size:   regular  Vitals Entered By: Everitt Amber LPN  Nutrition Counseling: Patient's BMI is greater than 25 and therefore counseled on weight management options. CC: CPE, headaches are not as frequent but when they come they are just as bad  Vision Screening:Left eye w/o correction: 20 / 20 Right Eye w/o correction: 20 / 20 Both eyes w/o correction:  20/ 20  Color vision testing: normal      Vision Entered By: Syliva Overman MD (March 28, 2010 10:19 AM)   Primary Care Provider:  Syliva Overman MD  CC:  CPE and headaches are not as frequent but when they come they are just as bad.  History of Present Illness: Reports  that she has been doing fairly well. Denies recent fever or chills. Denies sinus pressure, nasal congestion , ear pain or sore throat. Denies chest congestion, or cough productive of sputum. Denies chest pain, palpitations, PND, orthopnea or leg swelling. Denies  nausea, vomitting,  Denies change in bowel movements or bloody stool. Denies dysuria , frequency, incontinence or hesitancy. Denies  joint pain, swelling, or reduced mobility.   Denies  rash, lesions, or itch. Pt is still smoking , but is trying to cut back with a plan to quit     Current Medications (verified): 1)  Metoprolol Succinate 50 Mg Xr24h-Tab (Metoprolol Succinate) .... Take 1 Tablet By Mouth Once A Day 2)  Fiorinal/codeine #3 50-325-40-30 Mg Caps (Butalbital-Asa-Caff-Codeine) .... Take 1 Tablet For Severe Migraine Headache Onl;y, Max 2 Tabs Per Episode  Allergies (verified): 1)  ! Codeine  Review of Systems      See HPI Eyes:  Complains of blurring and light sensitivity; pooor vision   with blurring in sunlightesp. GI:  Complains of abdominal pain, constipation, diarrhea, and nausea; longstanding h/o constipation alternating with  loose stool with mucus, allso belching bloating and excessive nausea at times. GU:  Complains of discharge; pelvic pain and vag d/c. Neuro:  Complains of headaches; denies seizures; less frequent headaches. Psych:  Complains of anxiety and depression; denies panic attacks, suicidal thoughts/plans, thoughts of violence, and unusual visions or sounds; imoproved depression, seeing therapist. Endo:  Denies cold intolerance, excessive thirst, excessive urination, and polyuria. Heme:  Denies abnormal bruising and bleeding. Allergy:  Complains of seasonal allergies.  Physical Exam  General:  Well-developed,well-nourished,in no acute distress; alert,appropriate and cooperative throughout examination Head:  Normocephalic and atraumatic without obvious abnormalities. No apparent alopecia or balding. Eyes:  No corneal or conjunctival inflammation noted. EOMI. Perrla. Funduscopic exam benign, without hemorrhages, exudates or papilledema. Vision grossly normal. Ears:  External ear exam shows no significant lesions or deformities.  Otoscopic examination reveals clear canals, tympanic membranes are intact bilaterally without bulging, retraction, inflammation or discharge. Hearing is grossly normal bilaterally. Nose:  External nasal examination shows no deformity or inflammation. Nasal mucosa are pink and moist without lesions or exudates. Mouth:  Oral mucosa and oropharynx without lesions or exudates.  Teeth in good repair. Neck:  No deformities, masses, or tenderness noted. Chest Wall:  No  deformities, masses, or tenderness noted. Breasts:  No mass, nodules, thickening, tenderness, bulging, retraction, inflamation, nipple discharge or skin changes noted.   Lungs:  Normal respiratory effort, chest expands symmetrically. Lungs are clear to auscultation, no crackles  or wheezes. Heart:  Normal rate and regular rhythm. S1 and S2 normal without gallop, murmur, click, rub or other extra sounds. Abdomen:  Bowel sounds positive,abdomen soft and non-tender without masses, organomegaly or hernias noted. Genitalia:  Normal introitus for age, no external lesions, no vaginal discharge, mucosa pink and moist,white vaginal or cervical lesions, no vaginal atrophy, no friaility or hemorrhage, normal uterus size and position, no adnexal masses or tenderness Msk:  No deformity or scoliosis noted of thoracic or lumbar spine.   Pulses:  R and L carotid,radial,femoral,dorsalis pedis and posterior tibial pulses are full and equal bilaterally Extremities:  No clubbing, cyanosis, edema, or deformity noted with normal full range of motion of all joints.   Neurologic:  No cranial nerve deficits noted. Station and gait are normal. Plantar reflexes are down-going bilaterally. DTRs are symmetrical throughout. Sensory, motor and coordinative functions appear intact.   Impression & Recommendations:  Problem # 1:  VAGINITIS (ICD-616.10) Assessment Comment Only  The following medications were removed from the medication list:    Penicillin V Potassium 500 Mg Tabs (Penicillin v potassium) .Marland Kitchen... Take 1 tablet by mouth three times a day  Orders: T-Wet Prep by Molecular Probe [phone]) T-Chlamydia & GC Probe, Genital ([id number])  Problem # 2:  SCREENING FOR MALIGNANT NEOPLASM OF THE CERVIX (ICD-V76.2) Assessment: Comment Only  Orders: Pap Smear (09811)  Problem # 3:  EXAMINATION OF EYES AND VISION (ICD-V72.0) Assessment: Comment Only  Orders: Ophthalmology Referral (Ophthalmology), reports blurry vision  Problem # 4:  IBS (ICD-564.1) Assessment: Deteriorated  Orders: Gastroenterology Referral (GI)  Problem # 5:  HEADACHE (ICD-784.0) Assessment: Unchanged  Her updated medication list for this problem includes:    Metoprolol Succinate 50 Mg Xr24h-tab  (Metoprolol succinate) .Marland Kitchen... Take 1 tablet by mouth once a day    Fiorinal/codeine #3 50-325-40-30 Mg Caps (Butalbital-asa-caff-codeine) .Marland Kitchen... Take 1 tablet for severe migraine headache onl;y, max 2 tabs per episode pt to call and sched neurologu appt , she has already been referred in the past  Problem # 6:  NICOTINE ADDICTION (ICD-305.1) Assessment: Unchanged  Encouraged smoking cessation and discussed different methods for smoking cessation.   Problem # 7:  DEPRESSION, SEVERE (ICD-311) Assessment: Improved pt to copntinue with mental health services  Complete Medication List: 1)  Metoprolol Succinate 50 Mg Xr24h-tab (Metoprolol succinate) .... Take 1 tablet by mouth once a day 2)  Fiorinal/codeine #3 50-325-40-30 Mg Caps (Butalbital-asa-caff-codeine) .... Take 1 tablet for severe migraine headache onl;y, max 2 tabs per episode  Other Orders: Radiology Referral (Radiology) TLB-H. Pylori Abs(Helicobacter Pylori) ([address]-[id number]) T-TSH ([id number]) Influenza Vaccine NON MCR (13086)  Patient Instructions: 1)  Please schedule a follow-up appointment in32.5 months. 2)  you will be r referred top [facility] , opthalmology, and you need to call the neurologist for an appt , we have already referred you there. 3)  you will be referred for an [place] of your gallbladder. 4)  labs today tSH and h pylori 5)  Flu vac today     Influenza Vaccine    Vaccine Type: Fluvax Non-MCR    Site: right deltoid    Mfr: novartis    Dose: 0.5 ml    Route: IM    Given by: Everitt Amber LPN    Exp. Date: 11/2010  Lot #: [id number]

## 2010-08-27 NOTE — Letter (Signed)
Summary: Out of Work  [facility]  [address]   [place], [place] [address]   Phone: [phone]  Fax: [phone]    October 30, 2009   Employee:  Martha Montgomery    To Whom It May Concern:   For Medical reasons, please excuse the above named employee from work for the following dates:  Start:   10/29/2009  End:   10/31/2009 Without restrictions  If you need additional information, please feel free to contact our office.         Sincerely,    Milus Mallick. Lodema Hong, M.D.

## 2010-08-27 NOTE — Assessment & Plan Note (Signed)
Summary: office visit   Vital Signs:  Patient profile:   30 year old female Menstrual status:  on Depo Height:      62 inches Weight:      136.75 pounds BMI:     25.10 O2 Sat:      97 % Pulse rate:   70 / minute Pulse rhythm:   regular Resp:     16 per minute BP sitting:   130 / 90 Cuff size:   regular  Vitals Entered By: Everitt Amber (July 31, 2009 10:23 AM)  Nutrition Counseling: Patient's BMI is greater than 25 and therefore counseled on weight management options. CC: Follow up, had a migraine 2 days ago and was nauseated. Not nauseated today but would like a script for it   Primary Care Provider:  Syliva Overman MD  CC:  Follow up and had a migraine 2 days ago and was nauseated. Not nauseated today but would like a script for it.  History of Present Illness: pt reportas that she is having increased mood instability and depression. She moved out of her parent's home in the fall and is living with a gentleman 12 yrs her senior . she states her relationship with hermother has improved since she moved out. She is still unemployed. She would like to go for professional counselling for her mental health needs, but states tha tnow the co-pay has increaseed and it is not possible at this time. She will call around to other facilities to seeif she can get in at a lower rate. She is not suicidal or homicidal and denies hallucinations, but she does report functioning at a substandard level and increased forgetfullness and mood instability. She is exercising on avg 3 days per week, and she is also trying to quit smking, she recently invested in nicotine free cigarettes. Her headaches are essentially unchanged, and respond to the regular medication which she uses.  Preventive Screening-Counseling & Management  Alcohol-Tobacco     Smoking Status: current  Comments: 2 a day  Current Medications (verified): 1)  Metoprolol Succinate 50 Mg Xr24h-Tab (Metoprolol Succinate) .... Take 1  Tablet By Mouth Once A Day 2)  Fiorinal/codeine #3 50-325-40-30 Mg Caps (Butalbital-Asa-Caff-Codeine) .... Take 1 Tablet For Seevre Migraine Headache Onl;y, Max 2 Tabs Per Episode  Allergies (verified): 1)  ! Codeine  Review of Systems      See HPI Eyes:  Denies blurring, discharge, and red eye. Neuro:  Complains of headaches; denies seizures and sensation of room spinning. Psych:  Complains of anxiety, depression, easily angered, easily tearful, irritability, mental problems, and panic attacks; denies suicidal thoughts/plans, thoughts of violence, unusual visions or sounds, and thoughts /plans of harming others; poor concentration and memory loss. Endo:  Denies cold intolerance, excessive hunger, excessive thirst, excessive urination, heat intolerance, polyuria, and weight change. Heme:  Denies abnormal bruising and bleeding. Allergy:  Complains of seasonal allergies; denies hives or rash.  Physical Exam  General:  Well-developed,well-nourished,in no acute distress; alert,appropriate and cooperative throughout examination HEENT: No facial asymmetry,  EOMI, No sinus tenderness, TM's Clear, oropharynx  pink and moist.   Chest: Clear to auscultation bilaterally.  CVS: S1, S2, No murmurs, No S3.   Abd: Soft, Nontender.  MS: Adequate ROM spine, hips, shoulders and knees.  Ext: No edema.   CNS: CN 2-12 intact, power tone and sensation normal throughout.   Skin: Intact, no visible lesions or rashes.  Psych: Good eye contact,at times , her eye contact is poor, espescially  when discussing her new living arrangements. normal affect.  Memory intact, not anxious or depressed appearing.    Impression & Recommendations:  Problem # 1:  ALLERGIC RHINITIS CAUSE UNSPECIFIED (ICD-477.9) Assessment Improved  The following medications were removed from the medication list:    Flonase 50 Mcg/act Susp (Fluticasone propionate) .Marland Kitchen... 2 puffs in each nostril daily  Problem # 2:  HX, FAMILY, ASTHMA  (ICD-V17.5) Assessment: Unchanged pt has had ortho eval for the problem  Problem # 3:  NICOTINE ADDICTION (ICD-305.1) Assessment: Improved  The following medications were removed from the medication list:    Chantix Starting Month Pak 0.5 Mg X 11 & 1 Mg X 42 Tabs (Varenicline tartrate) ..... Use as directed    Chantix 1 Mg Tabs (Varenicline tartrate) .Marland Kitchen... Take 1 tablet by mouth two times a day  Encouraged smoking cessation and discussed different methods for smoking cessation.   Problem # 4:  MIGRAINE, CLASSICAL (ICD-346.00) Assessment: Unchanged  The following medications were removed from the medication list:    Naprosyn 500 Mg Tabs (Naproxen) ..... One b.i.d. Her updated medication list for this problem includes:    Metoprolol Succinate 50 Mg Xr24h-tab (Metoprolol succinate) .Marland Kitchen... Take 1 tablet by mouth once a day    Fiorinal/codeine #3 50-325-40-30 Mg Caps (Butalbital-asa-caff-codeine) .Marland Kitchen... Take 1 tablet for seevre migraine headache onl;y, max 2 tabs per episode  Problem # 5:  DEPRESSION, SEVERE (ICD-311) Assessment: Deteriorated seroquel samples were given during the office visit to start 50mg  around 7 pm  Complete Medication List: 1)  Metoprolol Succinate 50 Mg Xr24h-tab (Metoprolol succinate) .... Take 1 tablet by mouth once a day 2)  Fiorinal/codeine #3 50-325-40-30 Mg Caps (Butalbital-asa-caff-codeine) .... Take 1 tablet for seevre migraine headache onl;y, max 2 tabs per episode 3)  Seroquel Xr 50 Mg Xr24h-tab (Quetiapine fumarate) .... Take 1 tablet by mouth once a day  Patient Instructions: 1)  F/U in 6 weeks 2)  Pls call around to see where you can get psych treatment at the most affordable cost. 3)  It is important that you exercise regularly at least 40 minutes 6 times a week. If you develop chest pain, have severe difficulty breathing, or feel very tired , stop exercising immediately and seek medical attention. 4)  You need to lose weight. Consider a lower calorie  diet and regular exercise.  5)  Congrats on your decision to stop smoking , quit date is April 1 6)  New med  for depression, take one every evening   around 8pm   Prescriptions: FIORINAL/CODEINE #3 50-325-40-30 MG CAPS (BUTALBITAL-ASA-CAFF-CODEINE) take 1 tablet for seevre migraine headache onl;y, max 2 tabs per episode  #15 x 0   Entered by:   Worthy Keeler LPN   Authorized by:   Syliva Overman MD   Signed by:   Worthy Keeler LPN on 11/91/4782   Method used:   Printed then faxed to ...       [facility] (retail)       [address]       [place], [place]  [address]       Ph: [phone]       Fax: [phone]   RxID:   [id number] METOPROLOL SUCCINATE 50 MG XR24H-TAB (METOPROLOL SUCCINATE) Take 1 tablet by mouth once a day  #30 x 4   Entered by:   Worthy Keeler LPN   Authorized by:   Syliva Overman MD   Signed  by:   Worthy Keeler LPN on 81/19/1478   Method used:   Printed then faxed to ...       [facility]       [address]       [place], [place]  [address]       Ph: [phone]       Fax: [phone]   RxID:   [id number] SEROQUEL XR 50 MG XR24H-TAB (QUETIAPINE FUMARATE) Take 1 tablet by mouth once a day  #16 x 0   Entered and Authorized by:   Syliva Overman MD   Signed by:   Syliva Overman MD on 07/31/2009   Method used:   Samples Given   RxID:   [id number]

## 2010-08-27 NOTE — Letter (Signed)
Summary: disability claim  disability claim   Imported By: Rudene Anda 11/15/2009 08:40:14  _____________________________________________________________________  External Attachment:    Type:   Image     Comment:   External Document

## 2010-08-27 NOTE — Assessment & Plan Note (Signed)
Summary: shot  Nurse Visit   Allergies: 1)  ! Codeine Laboratory Results   Urine Tests  Date/Time Received: March 04, 2010 11:22 AM  Date/Time Reported: March 04, 2010 11:22 AM     Urine HCG: negative    Medication Administration  Injection # 1:    Medication: Depo-Provera 150mg     Diagnosis: CONTRACEPTIVE MANAGEMENT (ICD-V25.09)    Route: IM    Site: RUOQ gluteus    Exp Date: 1/14    Lot #: [id number]    Mfr: [email or web address]    Patient tolerated injection without complications    Given by: Adella Hare LPN (March 04, 2010 11:09 AM)  Orders Added: 1)  Urine Pregnancy Test  [81025] 2)  Depo-Provera 150mg  [J1055] 3)  Admin of Therapeutic Inj  intramuscular or subcutaneous [96372]   Medication Administration  Injection # 1:    Medication: Depo-Provera 150mg     Diagnosis: CONTRACEPTIVE MANAGEMENT (ICD-V25.09)    Route: IM    Site: RUOQ gluteus    Exp Date: 1/14    Lot #: [id number] Pao    Mfr: [email or web address]    Patient tolerated injection without complications    Given by: Adella Hare LPN (March 04, 2010 11:09 AM)  Orders Added: 1)  Urine Pregnancy Test  [81025] 2)  Depo-Provera 150mg  [J1055] 3)  Admin of Therapeutic Inj  intramuscular or subcutaneous [96372]  Laboratory Results   Urine Tests      Urine HCG: negative

## 2010-08-27 NOTE — Progress Notes (Signed)
Summary: MIGRAINE AND NAU  Phone Note Call from Patient   Summary of Call: NAUSEA AND MIGRAINE  [facility]  [place]  CALL BACK AT [phone] TO LET HER IF YOU CAN  CALL SOMETHING IN OR WHAT TO DO Initial call taken by: Lind Guest,  October 29, 2009 11:24 AM  Follow-up for Phone Call        pls erx and advise phenergan 25mg  every 6 to 8 hrs as needed for nausea.  Nurse visit on Tuesday if she wants for toradol 60mg  iM , depomedrol 80mg  IM and zofran 4mg  iM. \par offer work excuse for 04/ and 04/05 if she comes in, I also recommend neurology eval for uncontrolled migraine, if she agrees  appt with Dr Gerilyn Pilgrim asap pl. I will fw referral appt to scheduling Follow-up by: Syliva Overman MD,  October 29, 2009 11:52 PM  Additional Follow-up for Phone Call Additional follow up Details #1::        pls refer pt to dr Gerilyn Pilgrim uncontrolled migraones asap Additional Follow-up by: Syliva Overman MD,  October 29, 2009 11:53 PM    Additional Follow-up for Phone Call Additional follow up Details #2::    sent over information to dr. Harriett Sine office. they will call pt with appt. pt notified  Follow-up by: Rudene Anda,  October 30, 2009 3:12 PM

## 2010-08-27 NOTE — Assessment & Plan Note (Signed)
Summary: ov   Vital Signs:  Patient profile:   30 year old female Menstrual status:  on Depo Height:      62 inches Weight:      145.50 pounds BMI:     26.71 O2 Sat:      97 % Pulse rate:   85 / minute Pulse rhythm:   regular Resp:     16 per minute BP sitting:   120 / 90  (left arm) Cuff size:   regular  Vitals Entered By: Everitt Amber LPN (November 14, 2009 10:52 AM)  Nutrition Counseling: Patient's BMI is greater than 25 and therefore counseled on weight management options. CC: having depression and anxiety, wants to request a temporary leave from work   Primary Care Provider:  Syliva Overman MD  CC:  having depression and anxiety and wants to request a temporary leave from work.  History of Present Illness: Pt comes in today stating that her stress is worse, she is currently unable to work and requires 1 month at least to improve. Crying all the time, inc anxiety and panic attacks, poor memoy and concentration, overwhelmed, less pleasure in life, denies being suicidal or homicida lor hallucination . Cries even in her sleep and feels sad all the time. States her partener is very supportive. the job stress is worsening, keep changing job description, now new family strss,she is currently hosting a cousin who has been put out by her Mom and [place], . She ahs not worked since 11/12/2009, and is wanting to be involved with mental health counselling and care asap  Preventive Screening-Counseling & Management  Alcohol-Tobacco     Smoking Cessation Counseling: yes  Current Medications (verified): 1)  Metoprolol Succinate 50 Mg Xr24h-Tab (Metoprolol Succinate) .... Take 1 Tablet By Mouth Once A Day 2)  Fiorinal/codeine #3 50-325-40-30 Mg Caps (Butalbital-Asa-Caff-Codeine) .... Take 1 Tablet For Severe Migraine Headache Onl;y, Max 2 Tabs Per Episode 3)  Citalopram Hydrobromide 20 Mg Tabs (Citalopram Hydrobromide) .... Take 1 Tablet By Mouth Once A Day 4)  Fluconazole 150 Mg Tabs  (Fluconazole) .... One Tab By Mouth  Allergies (verified): 1)  ! Codeine  Review of Systems      See HPI General:  Complains of fatigue, malaise, and sleep disorder; denies chills and fever. Eyes:  Denies blurring and discharge. ENT:  Denies hoarseness, nasal congestion, sinus pressure, and sore throat. CV:  Denies chest pain or discomfort and palpitations. Resp:  Denies cough and sputum productive. GI:  Denies abdominal pain, constipation, diarrhea, nausea, and vomiting. GU:  Denies dysuria and urinary frequency. MS:  Denies joint pain and stiffness. Neuro:  Complains of headaches; improved. Psych:  Complains of anxiety, depression, easily tearful, irritability, and mental problems; denies suicidal thoughts/plans, thoughts of violence, and unusual visions or sounds. Allergy:  Complains of seasonal allergies.  Physical Exam  General:  alert and well-nourished.  Tearful, flat affect, poor eye contact, in no C/p distress. HEENT: No facial asymmetry,  EOMI, No sinus tenderness, TM's Clear, oropharynx  pink and moist.   Chest: Clear to auscultation bilaterally.  CVS: S1, S2, No murmurs, No S3.   Abd: Soft, Nontender.  MS: Adequate ROM spine, hips, shoulders and knees.  Ext: No edema.   CNS: CN 2-12 intact, power tone and sensation normal throughout.   Skin: Intact, no visible lesions or rashes.     Impression & Recommendations:  Problem # 1:  HEADACHE (ICD-784.0) Assessment Unchanged  Her updated medication list for this problem includes:  Metoprolol Succinate 50 Mg Xr24h-tab (Metoprolol succinate) .Marland Kitchen... Take 1 tablet by mouth once a day    Fiorinal/codeine #3 50-325-40-30 Mg Caps (Butalbital-asa-caff-codeine) .Marland Kitchen... Take 1 tablet for severe migraine headache onl;y, max 2 tabs per episode  followed by neurlogy for this  Problem # 2:  DEPRESSION, SEVERE (ICD-311) Assessment: Deteriorated  Her updated medication list for this problem includes:    Citalopram Hydrobromide 20  Mg Tabs (Citalopram hydrobromide) .Marland Kitchen... Take 1 tablet by mouth once a day pt to get appt with mental health prior to leaving office today, she is taken out of work for 1 month due to deterioration in her mental ehalth  Complete Medication List: 1)  Metoprolol Succinate 50 Mg Xr24h-tab (Metoprolol succinate) .... Take 1 tablet by mouth once a day 2)  Fiorinal/codeine #3 50-325-40-30 Mg Caps (Butalbital-asa-caff-codeine) .... Take 1 tablet for severe migraine headache onl;y, max 2 tabs per episode 3)  Citalopram Hydrobromide 20 Mg Tabs (Citalopram hydrobromide) .... Take 1 tablet by mouth once a day 4)  Fluconazole 150 Mg Tabs (Fluconazole) .... One tab by mouth Followed by neurology fopr this  Patient Instructions: 1)  Please schedule a follow-up appointment in 1 month. 2)  Tobacco is very bad for your health and your loved ones! You Should stop smoking!. 3)  Stop Smoking Tips: Choose a Quit date. Cut down before the Quit date. decide what you will do as a substitute when you feel the urge to smoke(gum,toothpick,exercise). 4)  It is important that you exercise regularly at least 20 minutes 5 times a week. If you develop chest pain, have severe difficulty breathing, or feel very tired , stop exercising immediately and seek medical attention. 5)  You need to lose weight. Consider a lower calorie diet and regular exercise.  6)  You will be referred urgently to counselling for depresssion and you will be taken out of work for 4 weeks

## 2010-08-27 NOTE — Letter (Signed)
Summary: Out of Work  [facility]  [address]   [place], [place] [address]   Phone: [phone]  Fax: [phone]    November 14, 2009   Employee:  [employer]ENCE [employer]INTON [employer]HMAD    To Whom It May Concern:   For Medical reasons, please excuse the above named employee from work for the following dates:  Start:   11/12/2009  End:   12/10/2009 Without Restrictions  If you need additional information, please feel free to contact our office.         Sincerely,    Milus Mallick. Lodema Hong, M.D.

## 2010-08-27 NOTE — Progress Notes (Signed)
Summary: RESULTS  Phone Note Call from Patient   Summary of Call: WANTS RESULTS FROM [facility] CALL HER BACK AT  [phone] Initial call taken by: Lind Guest,  May 02, 2010 3:09 PM  Follow-up for Phone Call        results are back but they are still unsigned, will call when doc comments  Follow-up by: Everitt Amber LPN,  May 02, 2010 3:13 PM  Additional Follow-up for Phone Call Additional follow up Details #1::        still having problems with bloating and bad aching in her abdomen. Made app with [facility] for today  Additional Follow-up by: Everitt Amber LPN,  May 03, 2010 1:08 PM     Appended Document: RESULTS actually patient was referred to [facility] and pt never recieved app. Called [facility] and gave her cell number and they were going to call her now

## 2010-08-27 NOTE — Progress Notes (Signed)
Summary: rx  Phone Note Call from Patient   Summary of Call: need a diflocan  she is almost done with  [facility]  call back to let her know [phone] Initial call taken by: Lind Guest,  November 07, 2009 9:11 AM  Follow-up for Phone Call        pls advise and erx fluconazole 150mg  #1 only Follow-up by: Syliva Overman MD,  November 07, 2009 11:15 AM  Additional Follow-up for Phone Call Additional follow up Details #1::        left detailed message Additional Follow-up by: Adella Hare LPN,  November 07, 2009 3:45 PM    New/Updated Medications: FLUCONAZOLE 150 MG TABS (FLUCONAZOLE) one tab by mouth Prescriptions: FLUCONAZOLE 150 MG TABS (FLUCONAZOLE) one tab by mouth  #1 x 0   Entered by:   Adella Hare LPN   Authorized by:   Syliva Overman MD   Signed by:   Adella Hare LPN on 30/86/5784   Method used:   Electronically to        [facility] (retail)       [address]       [place], [place]  [address]       Ph: [phone]       Fax: [phone]   RxID:   [id number]

## 2010-08-27 NOTE — Letter (Signed)
Summary: DISABILITY FORM  DISABILITY FORM   Imported By: Lind Guest 11/20/2009 08:21:11  _____________________________________________________________________  External Attachment:    Type:   Image     Comment:   External Document

## 2010-08-27 NOTE — Progress Notes (Signed)
Summary: [facility] PAPERS  Phone Note Call from Patient   Summary of Call: [facility] CALLED AND WANTS TO KNOW DID YOU RECEIVE THE PAPER ON HER DISABILITY PAPER FROM [facility]  CALL BACK AT [phone]  CLAIM # [id number] Initial call taken by: Lind Guest,  November 14, 2009 1:29 PM  Follow-up for Phone Call        let them know yes , i saw pt today , papers will  be ready next week Follow-up by: Syliva Overman MD,  November 14, 2009 4:30 PM  Additional Follow-up for Phone Call Additional follow up Details #1::        advised Additional Follow-up by: Adella Hare LPN,  November 14, 2009 4:35 PM

## 2010-08-27 NOTE — Letter (Signed)
Summary: Medical record request Disab Determination  Medical record request Disab Determination   Imported By: Cammie Sickle 08/11/2009 21:05:13  _____________________________________________________________________  External Attachment:    Type:   Image     Comment:   External Document

## 2010-08-27 NOTE — Progress Notes (Signed)
Summary: RESULTS  Phone Note Call from Patient   Summary of Call: WANTS RWSULTS  CALL BACK Initial call taken by: Lind Guest,  April 16, 2010 2:47 PM  Follow-up for Phone Call        patient aware of [place] results Follow-up by: Adella Hare LPN,  April 16, 2010 3:11 PM

## 2010-08-27 NOTE — Progress Notes (Signed)
Summary: RESULTS  Phone Note Call from Patient   Summary of Call: RESULTS OF LAB WORK Initial call taken by: Lind Guest,  April 10, 2010 2:40 PM  Follow-up for Phone Call        returned call, left message Follow-up by: Adella Hare LPN,  April 10, 2010 3:22 PM  Additional Follow-up for Phone Call Additional follow up Details #1::        Patient aware labs are normal Additional Follow-up by: Everitt Amber LPN,  April 10, 2010 3:25 PM

## 2010-08-27 NOTE — Letter (Signed)
Summary: MEDICAL RELEASE  MEDICAL RELEASE   Imported By: Lind Guest 04/12/2010 09:01:41  _____________________________________________________________________  External Attachment:    Type:   Image     Comment:   External Document

## 2010-08-27 NOTE — Progress Notes (Signed)
Summary: headache medicine  Phone Note Call from Patient   Summary of Call: having mirgaine headaches and [facility] call back at [phone] Initial call taken by: Lind Guest,  October 19, 2009 1:19 PM  Follow-up for Phone Call        Med was filled the 4th of this month already. It said that patient expects to use a max of 4 per month. I asked her if her headaches had been coming more frequently since she was out of meds already and she said yes. I told her that before we refilled them that you may want to see her for evaluation since her headaches have worsened. Refill or OV? Follow-up by: Everitt Amber LPN,  October 19, 2009 1:21 PM  Additional Follow-up for Phone Call Additional follow up Details #1::        pt needs ov with me next week re headaches, pls call her and schedule one Additional Follow-up by: Syliva Overman MD,  October 19, 2009 4:11 PM    Additional Follow-up for Phone Call Additional follow up Details #2::    COMING IN TODAY Follow-up by: Lind Guest,  October 22, 2009 9:25 AM  Additional Follow-up for Phone Call Additional follow up Details #3:: Details for Additional Follow-up Action Taken: noted Additional Follow-up by: Syliva Overman MD,  October 22, 2009 3:58 PM

## 2010-08-27 NOTE — Progress Notes (Signed)
Summary: MIGRAINE HEADACHE  Phone Note Call from Patient   Summary of Call: SHE HAS MIGRAINE  DR. DOONQUAH HE GAVE HER SOMETHING AND IT DID NOT WORK  THEY TOLD SHE WOULD HAVE TO WAIT ON HER NEXT APPT TO LET HIM KNOW THE MEDICINE DOES NOT WORK  SHE WANTS A REFILL ON SOME MEDICINE SHE LEFT A MESSAGE AND PLEASE CALL HER BACK AT [phone] Initial call taken by: Lind Guest,  Nov 26, 2009 1:47 PM  Follow-up for Phone Call        [place] gave her maxalt and she said it does nothing for her headaches and they told her if it didn't work then she would have to wait until her next OV to let him know and get it changed and she can't wait that long. WANTS REFILL ON FIORCET.  [facility] [place] Follow-up by: Everitt Amber LPN,  Nov 26, 1189 2:14 PM  Additional Follow-up for Phone Call Additional follow up Details #1::        pls refill x1 only Additional Follow-up by: Syliva Overman MD,  Nov 26, 2009 3:53 PM    Additional Follow-up for Phone Call Additional follow up Details #2::    pt aware Follow-up by: Everitt Amber LPN,  Nov 26, 4780 3:58 PM  Prescriptions: FIORINAL/CODEINE #3 50-325-40-30 MG CAPS (BUTALBITAL-ASA-CAFF-CODEINE) take 1 tablet for severe migraine headache onl;y, max 2 tabs per episode  #15 x 0   Entered by:   Everitt Amber LPN   Authorized by:   Syliva Overman MD   Signed by:   Everitt Amber LPN on 95/62/1308   Method used:   Printed then faxed to ...       [facility] (retail)       [address]       [place], [place]  [address]       Ph: [phone]       Fax: [phone]   RxID:   [id number]

## 2010-08-27 NOTE — Assessment & Plan Note (Signed)
Summary: headache- room 1   Vital Signs:  Patient profile:   30 year old female Menstrual status:  on Depo Height:      62 inches Weight:      147 pounds BMI:     26.98 O2 Sat:      97 % on Room air Pulse rate:   78 / minute Resp:     16 per minute BP sitting:   120 / 70  (left arm)  Vitals Entered By: Adella Hare LPN (April 02, 2010 9:46 AM) CC: headache Is Patient Diabetic? No Pain Assessment Patient in pain? yes     Location: head Intensity: 9 Type: aching Onset of pain  Constant   Referring Provider:  Dr. Syliva Overman Primary Provider:  Syliva Overman MD  CC:  headache.  History of Present Illness: Pt states HA started yesterday.  Had appt with Dr Gerilyn Pilgrim this morning  but was unable to be seen because unable to pay previous balance today.  She is making a payment arrangement with his office and will be able to be seen in the future.  Typical HA. Lt frontal, sharp pain.  Blurred vision as aura.  +photo and phonophobia.  +nausea , no vomiting.  No numbness or tingling. Triptans no help, has tried different ones in the past. Tried Topamax for prevention & no help. Gets migraines couple times a mos.      Allergies (verified): 1)  ! Codeine  Past History:  Past medical history reviewed for relevance to current acute and chronic problems.  Past Medical History: Reviewed history from 04/23/2009 and no changes required. Hypertension x 6 years Anxiety disorder x 6 years Depression Migrainesx 6 years thyroid/goiter  Review of Systems General:  Denies chills and fever. Eyes:  Denies double vision. ENT:  Denies earache, nasal congestion, and sore throat. CV:  Denies chest pain or discomfort. Resp:  Denies shortness of breath. GI:  Complains of nausea; denies vomiting. Neuro:  Denies numbness and tingling.  Physical Exam  General:  Well-developed,well-nourished,in no acute distress; alert,appropriate and cooperative throughout examination.  Sitting in darkened exam room. Head:  Normocephalic and atraumatic without obvious abnormalities. No apparent alopecia or balding. Eyes:  pupils equal, pupils round, pupils reactive to light, and no injection.  EOMI Ears:  External ear exam shows no significant lesions or deformities.  Otoscopic examination reveals clear canals, tympanic membranes are intact bilaterally without bulging, retraction, inflammation or discharge. Hearing is grossly normal bilaterally. Nose:  External nasal examination shows no deformity or inflammation. Nasal mucosa are pink and moist without lesions or exudates. Mouth:  Oral mucosa and oropharynx without lesions or exudates.  Teeth in good repair. Neck:  No deformities, masses, or tenderness noted. Lungs:  Normal respiratory effort, chest expands symmetrically. Lungs are clear to auscultation, no crackles or wheezes. Heart:  Normal rate and regular rhythm. S1 and S2 normal without gallop, murmur, click, rub or other extra sounds. Neurologic:  alert & oriented X3, cranial nerves II-XII intact, strength normal in all extremities, gait normal, and DTRs symmetrical and normal.   Cervical Nodes:  No lymphadenopathy noted Psych:  Cognition and judgment appear intact. Alert and cooperative with normal attention span and concentration. No apparent delusions, illusions, hallucinations   Impression & Recommendations:  Problem # 1:  MIGRAINE WITH AURA (ICD-346.00) Assessment Deteriorated  Her updated medication list for this problem includes:    Metoprolol Succinate 50 Mg Xr24h-tab (Metoprolol succinate) .Marland Kitchen... Take 1 tablet by mouth once a day  Fiorinal/codeine #3 50-325-40-30 Mg Caps (Butalbital-asa-caff-codeine) .Marland Kitchen... Take 1 tablet for severe migraine headache onl;y, max 2 tabs per episode  Complete Medication List: 1)  Metoprolol Succinate 50 Mg Xr24h-tab (Metoprolol succinate) .... Take 1 tablet by mouth once a day 2)  Fiorinal/codeine #3 50-325-40-30 Mg Caps  (Butalbital-asa-caff-codeine) .... Take 1 tablet for severe migraine headache onl;y, max 2 tabs per episode  Patient Instructions: 1)  Please schedule a follow-up appointment as needed for headache. 2)  If you do not improve go to the ER for futher pain management. 3)  I have refilled your Fiorinal with Codeine. Prescriptions: FIORINAL/CODEINE #3 50-325-40-30 MG CAPS (BUTALBITAL-ASA-CAFF-CODEINE) take 1 tablet for severe migraine headache onl;y, max 2 tabs per episode  #15 x 0   Entered and Authorized by:   Esperanza Sheets PA   Signed by:   Esperanza Sheets PA on 04/02/2010   Method used:   Printed then faxed to ...       [facility] (retail)       [address]       [place], [place]  [address]       Ph: [phone]       Fax: [phone]   RxID:   [id number]   Appended Document: headache- room 1   Medication Administration  Injection # 1:    Medication: Zofran 1mg . injection    Diagnosis: NAUSEA (ICD-787.02)    Route: IM    Site: RUOQ gluteus    Exp Date: 10/12    Lot #: [id number]    Mfr: [email or web address]    Comments: zofran 4mg  given    Patient tolerated injection without complications    Given by: Adella Hare LPN (April 02, 2010 10:17 AM)  Orders Added: 1)  Zofran 1mg . injection [J2405] 2)  Admin of Therapeutic Inj  intramuscular or subcutaneous [40102]

## 2010-08-27 NOTE — Assessment & Plan Note (Signed)
Summary: DYSPEPSIA, IBS   Visit Type:  Initial Consult Primary Care Provider:  Lodema Hong, M.D.   Chief Complaint:  feels like something in throat.  History of Present Illness: Bloating. Postprandial nausea: spicy foods, heavy BBQ sauces. Nausea: waves of nause if hasn't had anything. Stomach makes noise. 3 mos sounds more active-no pain. Retaining fluid. Constipation for over a week. Dx: ? IBS. GI tract problems since age 30 or 73. May have diarrhea and may have constipation. Nl fomred stool: 1x/week. No problems swallowing but when she is asleep she feels like something is obstructing airway-hard time breathing. Weight loss: no. Appetite: varies. Sleep: at least 9 hours. No BRBPR or black tarry. May have pain in abd-lower, 2-3x/week. Meds tried: Fiber, Dulcolax Tried increasing fiber. Doesn't use anything for diarrhea. Tried probiotics: [employer], took for a couple of weeks. No milk. "Lactose intolerant" but eat a lot of dairy: cheese, ice cream. Reflux 3-4 times a week, Rx: no meds. ON CELEXA FOR AT LEAST 3 MOS. Constipation bothers her more than diarrhea.   Current Medications (verified): 1)  Metoprolol Succinate 50 Mg Xr24h-Tab (Metoprolol Succinate) .... Take 1 Tablet By Mouth Once A Day 2)  Celexa 20 Mg Tabs (Citalopram Hydrobromide) .... Once Daily 3)  Ativan 0.5 Mg Tabs (Lorazepam) .... As Needed  Allergies (verified): 1)  ! Codeine 2)  ! * Soy  Past History:  Past Surgical History: C-section-2008 Abscesses removed from R buttock/L arm  Family History: Mother-living-hypertension, arthritis, migraines Father-unknown 1 brother-healthy 1 sister-healthy Family History of Arthritis Hx, family, asthma MOTHER HAS NON-ULCER DYSPEPSIA. No FH of Colon Cancer OR POLYPS  Social History: Disability: PENDING, WAS WORKING CUSTOMER SERVICE. Pt is currently on Medicaid.  From [place] and came to [facility] in mid 2010. Single, ONE CHILD: age 26 Current Smoker: < 1pk/day Drug use-no Regular  exercise-yes Alcohol use-rare: 1x/mo Soft drinks: 2 sodas/week Sees [facility] once a month for past 6 mos. Pt has prior "trauma".  Vital Signs:  Patient profile:   30 year old female Menstrual status:  on Depo Height:      62 inches Weight:      145 pounds BMI:     26.62 Temp:     98.9 degrees F oral Pulse rate:   72 / minute BP sitting:   128 / 90  (left arm) Cuff size:   regular  Vitals Entered By: Hendricks Limes LPN (May 16, 2010 2:17 PM)  Impression & Recommendations:  Problem # 1:  DYSPEPSIA (ICD-536.8) Assessment Unchanged Likley 2o to non-ulcer dyspepsia +/- uncontrolled GERD. Many psychosocial stressors. EGD WITH BIOPSY OF GASTRIC AND DUODENAL MUCOSA. Take OMP 30 minutes prior to first meal. OPV in 2 mos.  Problem # 2:  IBS (ICD-564.1) Assessment: Unchanged Mixed pattern. Pt seeing mental health and on SSRI. Sx exacerbated by lactose intake. USE LACTASE PILLS WHEN CONSUMING DAIRY. A lactose free diet would be better. SEE HO. TAKE A PROBIOTIC DAILY ([employer] or [facility]). CONTINUE FIBER SUPPLEMENTS. DRINK 6 TO 8 CUPS OF WATER DAILY. Declined Rx for Levsin. Consider max dose of Celexa.  CC: PCP, & Dr. Gerilyn Pilgrim  Patient Instructions: 1)  USE LACTASE PILLS WHEN CONSUMING DAIRY. 2)  A lactose free diet would be better. SEE HO. 3)  TAKE A PROBIOTIC DAILY ([employer] or [facility]). 4)  CONTINUE FIBER SUPPLEMENTS. 5)  DRINK 6 TO 8 CUPS OF WATER DAILY. 6)  Upper endoscopy NOV 7. 7)  Follow up in 2 mos. 8)  The  medication list was reviewed and reconciled.  All changed / newly prescribed medications were explained.  A complete medication list was provided to the patient / caregiver. Prescriptions: PRILOSEC 20 MG CPDR (OMEPRAZOLE) 1 by mouth 30 minutes prior to first meal.  #30 x 5   Entered and Authorized by:   West Bali MD   Signed by:   West Bali MD on 05/16/2010   Method used:   Electronically to        [facility] (retail)       [address]       [place], [place]  [address]       Ph: [phone]       Fax: [phone]   RxID:   [id number]   Appended Document: DYSPEPSIA, IBS REMINDER IS IN THE COMPUTER  Appended Document: Orders Update    Clinical Lists Changes  Orders: Added new Service order of Consultation Level IV [phone]) - Signed

## 2010-08-29 NOTE — Progress Notes (Signed)
  Phone Note Call from Patient   Summary of Call: Has been very fatigued and doesn't have energy at all. Wants to know if she can start getting vitamin b12 injections in the office.  Initial call taken by: Everitt Amber LPN,  August 19, 2010 9:28 AM  Follow-up for Phone Call        she is not anemic , but pls order Vit B level with a dx of fatigue, let herr know getting injections will depend on the result of the blood test pls, also order cbc and tsh Follow-up by: Syliva Overman MD,  August 19, 2010 12:37 PM  Additional Follow-up for Phone Call Additional follow up Details #1::        orders sent to lab. Stepdad will get her to call me back  Additional Follow-up by: Everitt Amber LPN,  August 20, 2010 9:24 AM  New Problems: FATIGUE (ICD-780.79)   Additional Follow-up for Phone Call Additional follow up Details #2::    patient aware Follow-up by: Adella Hare LPN,  August 20, 2010 9:41 AM  New Problems: FATIGUE (ICD-780.79)

## 2010-08-29 NOTE — Assessment & Plan Note (Addendum)
Summary: STOMACH PAINS WITH VOMITING/SS   Visit Type:  Follow-up Visit Primary Care Provider:  Lodema Hong, M.D.   CC:  nausea and vomiting after eating [place].  History of Present Illness: Martha Montgomery is here for f/u. had EGD 05/2010 with Dr. Darrick Penna, secondary to dyspepsia. Showed gastritis. Has hx of IBS as well; this is being controlled pretty well with daily probiotic, fiber, and pt denies any exacerbation at this time.   Suffers from migraine headaches. Feels food may be part of this. one month ago, eggplant parmesan, got migraine +nausea/vomiting. day ago, ate baked ziti and oranges prior to it. +n/v. took prilosec before eating. No abdominal pain. tries to take prilosec first thing in am. doesn't have "solid" appetite. sometimes not too hungry in morning. usually first meal around noon. has tried zantac prior to omeprazole but nothing else. No dysphagia/odynophagia.    Jun 03, 2010: UEA:[id number]: 1. Normal esophagus without evidence of Barrett mass, erosion,     ulceration, or stricture. 2. Multiple linear erosions in the gastric mucosa associated with     erythema and edema.  Biopsies obtained via cold forceps to evaluate     for H. pylori gastritis. 3. Normal-appearing duodenal bulb, ampulla, and second portion of the     duodenum.  Biopsies obtained via cold forceps to evaluate for     celiac sprue as an etiology for intermittent diarrhea.   Current Medications (verified): 1)  Metoprolol Succinate 50 Mg Xr24h-Tab (Metoprolol Succinate) .... Take 1 Tablet By Mouth Once A Day 2)  Celexa 20 Mg Tabs (Citalopram Hydrobromide) .... Once Daily 3)  Ativan 0.5 Mg Tabs (Lorazepam) .... As Needed 4)  Prilosec 20 Mg Cpdr (Omeprazole) .Marland Kitchen.. 1 By Mouth 30 Minutes Prior To First Meal. 5)  Fiorinal/codeine #3 50-325-40-30 Mg Caps (Butalbital-Asa-Caff-Codeine) .... One Tab For Severe Migraine Only Max 2 Caps Per Episode 6)  Ibuprofen 800 Mg Tabs (Ibuprofen) .... As Needed 7)  Topamax 50 Mg Tabs  (Topiramate) .... Two Times A Day  Allergies (verified): 1)  ! Codeine 2)  ! * Soy  Past History:  Past Medical History: Hypertension x 6 years Anxiety disorder x 6 years Depression Migrainesx 6 years thyroid/goiter EGD Nov 2011: gastritis  Review of Systems General:  Denies fever, chills, and anorexia. Eyes:  Denies blurring, irritation, and discharge. ENT:  Denies sore throat, hoarseness, and difficulty swallowing. CV:  Denies chest pains and syncope. Resp:  Denies dyspnea at rest and wheezing. GI:  Complains of indigestion/heartburn; denies difficulty swallowing, pain on swallowing, nausea, constipation, change in bowel habits, bloody BM's, and black BMs. GU:  Denies urinary burning and urinary frequency. MS:  Denies joint pain / LOM, joint swelling, and joint stiffness. Derm:  Denies rash, itching, and dry skin. Neuro:  Denies weakness and syncope. Psych:  Denies depression and anxiety. Endo:  Denies cold intolerance and heat intolerance.  Vital Signs:  Patient profile:   30 year old female Menstrual status:  on Depo Height:      62 inches Weight:      139 pounds BMI:     25.52 Temp:     98.6 degrees F oral Pulse rate:   68 / minute BP sitting:   122 / 84  (left arm) Cuff size:   regular  Vitals Entered By: Hendricks Limes LPN (August 22, 2010 11:26 AM)  Physical Exam  General:  Well developed, well nourished, no acute distress. Ears:  sclera without icterus Lungs:  Clear throughout to auscultation. Heart:  Regular rate and rhythm; no murmurs, rubs,  or bruits. Abdomen:  +BS, non-tender, non-distended, no rebound or guarding. no HSM.  Psych:  Alert and cooperative. Normal mood and affect.  Impression & Recommendations:  Problem # 1:  GASTRITIS (ICD-535.50)  Gastritis on EGD from Nov 2011. Reflux, N/V exacerbated by tomato-based foods. 2 occurrences since EGD performed. On prilosec daily, but doesn't feel this is helping. No dysphagia/odynophagia.   Trial  of Dexilant. Samples for 2 weeks given, as well as savings card.  Call with PR in 10 days. Will fax rx if pt feels this option is better Return in 3 mos  Orders: Est. Patient Level II (60454)  Problem # 2:  IBS (ICD-564.1)  stable at this point. No melena/hematochezia. Taking probiotic, fiber daily.   Continue current plan. F/U in 3 mos.   Orders: Est. Patient Level II (09811)  Appended Document: STOMACH PAINS WITH VOMITING/SS F/U OVP IS IN THE COMPUTER

## 2010-08-29 NOTE — Letter (Signed)
Summary: [facility]   Imported By: Lind Guest 07/10/2010 10:11:44  _____________________________________________________________________  External Attachment:    Type:   Image     Comment:   External Document

## 2010-08-29 NOTE — Assessment & Plan Note (Signed)
Summary: NURSE VISIT/DEPO/SLJ  Nurse Visit   Vital Signs:  Patient profile:   30 year old female Menstrual status:  on Depo Height:      62 inches Weight:      140.50 pounds BMI:     25.79 BP sitting:   120 / 78  (left arm) Cuff size:   regular  Vitals Entered By: Everitt Amber LPN (August 19, 2010 9:25 AM) CC: Nurse visit for Depo provera contraception  Comments Next shot due around April 20    Allergies: 1)  ! Codeine 2)  ! * Soy  Medication Administration  Injection # 1:    Medication: Depo-Provera 150mg     Diagnosis: CONTRACEPTIVE MANAGEMENT (ICD-V25.09)    Route: IM    Site: LUOQ gluteus    Exp Date: 07/2012    Lot #: [id number]    Mfr: [email or web address]     Comments: 150mg  given     Patient tolerated injection without complications    Given by: Everitt Amber LPN (August 19, 2010 9:26 AM)  Orders Added: 1)  Depo-Provera 150mg  [J1055] 2)  Admin of Therapeutic Inj  intramuscular or subcutaneous [96372]   Medication Administration  Injection # 1:    Medication: Depo-Provera 150mg     Diagnosis: CONTRACEPTIVE MANAGEMENT (ICD-V25.09)    Route: IM    Site: LUOQ gluteus    Exp Date: 07/2012    Lot #: [id number]    Mfr: [email or web address]     Comments: 150mg  given     Patient tolerated injection without complications    Given by: Everitt Amber LPN (August 19, 2010 9:26 AM)  Orders Added: 1)  Depo-Provera 150mg  [J1055] 2)  Admin of Therapeutic Inj  intramuscular or subcutaneous [96372] no complications noted

## 2010-08-29 NOTE — Assessment & Plan Note (Signed)
Summary: sinus and knees sev pain   Vital Signs:  Patient profile:   30 year old female Menstrual status:  on Depo Height:      62 inches Weight:      146.75 pounds BMI:     26.94 O2 Sat:      97 % on Room air Pulse rate:   76 / minute Pulse rhythm:   regular Resp:     16 per minute BP sitting:   118 / 80  (left arm)  Vitals Entered By: Adella Hare LPN (July 02, 2010 4:00 PM)  Nutrition Counseling: Patient's BMI is greater than 25 and therefore counseled on weight management options.  O2 Flow:  Room air CC: sinus pressure, colored sputum, sore throat, low back pain, bilateral knee pain Is Patient Diabetic? No   Primary Care Keiron Iodice:  Lodema Hong, M.D.   CC:  sinus pressure, colored sputum, sore throat, low back pain, and bilateral knee pain.  History of Present Illness: 2 day h/o yellow green nasal d/c maxillary pressure, body aches. She also c/o sore throat and tender neck glands, with fever and chills Increased bilateral knee and back pain x2 days.  Current Medications (verified): 1)  Metoprolol Succinate 50 Mg Xr24h-Tab (Metoprolol Succinate) .... Take 1 Tablet By Mouth Once A Day 2)  Celexa 20 Mg Tabs (Citalopram Hydrobromide) .... Once Daily 3)  Ativan 0.5 Mg Tabs (Lorazepam) .... As Needed 4)  Prilosec 20 Mg Cpdr (Omeprazole) .Marland Kitchen.. 1 By Mouth 30 Minutes Prior To First Meal. 5)  Fiorinal/codeine #3 50-325-40-30 Mg Caps (Butalbital-Asa-Caff-Codeine) .... One Tab For Severe Migraine Only Max 2 Caps Per Episode  Allergies (verified): 1)  ! Codeine 2)  ! * Soy  Review of Systems      See HPI General:  Complains of chills and fever. Eyes:  Denies blurring, discharge, eye pain, and red eye. ENT:  Complains of hoarseness, nasal congestion, postnasal drainage, sinus pressure, and sore throat. CV:  Denies chest pain or discomfort, palpitations, and shortness of breath with exertion. Resp:  Denies cough and sputum productive. GI:  Denies abdominal pain, constipation,  diarrhea, and indigestion. GU:  Denies dysuria and urinary frequency. MS:  Denies joint pain, joint swelling, low back pain, mid back pain, and stiffness. Neuro:  Denies brief paralysis, falling down, headaches, poor balance, and seizures. Psych:  Denies anxiety, depression, mental problems, suicidal thoughts/plans, and thoughts of violence. Endo:  Denies cold intolerance, excessive hunger, excessive thirst, and heat intolerance. Heme:  Denies abnormal bruising, bleeding, enlarge lymph nodes, and pallor. Allergy:  Denies hives or rash and itching eyes.  Physical Exam  General:  Well-developed,well-nourished,in no acute distress; alert,appropriate and cooperative throughout examination HEENT: No facial asymmetry,  EOMI,positive sinus tenderness, TM's Clear, oropharynx erythematous with exudate on right tonsil, bilateral anterior cervical adenitis. chest:clear, no crACKLES or wheezes CVS: S1, S2, No murmurs, No S3.   Abd: Soft, Nontender.  MS: Adequate ROM spine, hips, shoulders and reduced in  knees.  Ext: No edema.   CNS: CN 2-12 intact, power tone and sensation normal throughout.   Skin: Intact, no visible lesions or rashes.  Psych: Good eye contact, normal affect.  Memory intact, not anxious or depressed appearing.    Impression & Recommendations:  Problem # 1:  ACUTE PHARYNGITIS (ICD-462) Assessment Comment Only  Her updated medication list for this problem includes:    Penicillin V Potassium 500 Mg Tabs (Penicillin v potassium) .Marland Kitchen... Take 1 tablet by mouth three times a day  Orders: Rapid Strep ([address]) Rocephin  250mg  (U0454) Depo- Medrol 80mg  (J1040) Ketorolac-Toradol 15mg  (U9811) Admin of Therapeutic Inj  intramuscular or subcutaneous (91478)  Problem # 2:  KNEE PAIN, BILATERAL (ICD-719.46) Assessment: Deteriorated toradol and depomedrol administered fo same  Problem # 3:  DEPRESSION, SEVERE (ICD-311) Assessment: Improved  Her updated medication list for this  problem includes:    Celexa 20 Mg Tabs (Citalopram hydrobromide) ..... Once daily    Ativan 0.5 Mg Tabs (Lorazepam) .Marland Kitchen... As needed pt involved with mental health services  Problem # 4:  HEADACHE (ICD-784.0) Assessment: Improved  Her updated medication list for this problem includes:    Metoprolol Succinate 50 Mg Xr24h-tab (Metoprolol succinate) .Marland Kitchen... Take 1 tablet by mouth once a day    Fiorinal/codeine #3 50-325-40-30 Mg Caps (Butalbital-asa-caff-codeine) ..... One tab for severe migraine only max 2 caps per episode neurology assisting with management  Complete Medication List: 1)  Metoprolol Succinate 50 Mg Xr24h-tab (Metoprolol succinate) .... Take 1 tablet by mouth once a day 2)  Celexa 20 Mg Tabs (Citalopram hydrobromide) .... Once daily 3)  Ativan 0.5 Mg Tabs (Lorazepam) .... As needed 4)  Prilosec 20 Mg Cpdr (Omeprazole) .Marland Kitchen.. 1 by mouth 30 minutes prior to first meal. 5)  Fiorinal/codeine #3 50-325-40-30 Mg Caps (Butalbital-asa-caff-codeine) .... One tab for severe migraine only max 2 caps per episode 6)  Penicillin V Potassium 500 Mg Tabs (Penicillin v potassium) .... Take 1 tablet by mouth three times a day 7)  Prednisone (pak) 5 Mg Tabs (Prednisone) .... Use as directed 8)  Tessalon Perles 100 Mg Caps (Benzonatate) .... Take 1 capsule by mouth three times a day 9)  Fluconazole 150 Mg Tabs (Fluconazole) .... Take 1 tablet by mouth once a day as needed for vaginal itch  Patient Instructions: 1)  F/U as before. 2)  You are being treated for sinusitis and pharyngitis, injections in the office and meds are being sentin also.  Prescriptions: FLUCONAZOLE 150 MG TABS (FLUCONAZOLE) Take 1 tablet by mouth once a day as needed for vaginal itch  #3 x 0   Entered and Authorized by:   Syliva Overman MD   Signed by:   Syliva Overman MD on 07/02/2010   Method used:   Electronically to        [facility] (retail)       [address]       [place], [place]  [address]       Ph: [phone]       Fax: [phone]   RxID:   [id number] TESSALON PERLES 100 MG CAPS (BENZONATATE) Take 1 capsule by mouth three times a day  #20 x 0   Entered and Authorized by:   Syliva Overman MD   Signed by:   Syliva Overman MD on 07/02/2010   Method used:   Electronically to        [facility] (retail)       [address]       [place], [place]  [address]       Ph: [phone]       Fax: [phone]   RxID:   [id number] PREDNISONE (PAK) 5 MG TABS (PREDNISONE) Use as directed  #21 x 0   Entered and Authorized by:   Syliva Overman MD   Signed by:   Syliva Overman MD on 07/02/2010   Method used:  Electronically to        [facility] (retail)       [address]       [place], [place]  [address]       Ph: [phone]       Fax: [phone]   RxID:   [id number] PENICILLIN V POTASSIUM 500 MG TABS (PENICILLIN V POTASSIUM) Take 1 tablet by mouth three times a day  #30 x 0   Entered and Authorized by:   Syliva Overman MD   Signed by:   Syliva Overman MD on 07/02/2010   Method used:   Electronically to        [facility] (retail)       [address]       [place], [place]  [address]       Ph: [phone]       Fax: [phone]   RxID:   [id number]    Medication Administration  Injection # 1:    Medication: Rocephin  250mg     Diagnosis: ACUTE PHARYNGITIS (ICD-462)    Route: IM    Site: RUOQ gluteus    Exp Date: 01/14    Lot #: [id number]    Mfr: [email or web address]    Comments: rocephin 500mg  given    Patient tolerated injection without complications    Given by: Adella Hare LPN (July 02, 2010 4:58 PM)  Injection # 2:    Medication: Depo- Medrol 80mg     Diagnosis: ACUTE PHARYNGITIS (ICD-462)    Route: IM    Site: RUOQ gluteus    Exp Date: 06/12    Lot #: [id number]    Mfr: Pharmacia    Patient tolerated injection  without complications    Given by: Adella Hare LPN (July 02, 2010 4:59 PM)  Injection # 3:    Medication: Ketorolac-Toradol 15mg     Diagnosis: ACUTE PHARYNGITIS (ICD-462)    Route: IM    Site: RUOQ gluteus    Exp Date: 05/29/2011    Lot #: [id number]    Mfr: [email or web address]    Comments: toradol 60mg  given    Patient tolerated injection without complications    Given by: Adella Hare LPN (July 02, 2010 4:59 PM)  Orders Added: 1)  Est. Patient Level IV [10932] 2)  Rapid Strep [35573] 3)  Rocephin  250mg  [J0696] 4)  Depo- Medrol 80mg  [J1040] 5)  Ketorolac-Toradol 15mg  [J1885] 6)  Admin of Therapeutic Inj  intramuscular or subcutaneous [96372]    Laboratory Results  Date/Time Received: July 02, 2010 4:58 PM  Date/Time Reported: July 02, 2010 4:58 PM   Other Tests  Rapid Strep: negative     Medication Administration  Injection # 1:    Medication: Rocephin  250mg     Diagnosis: ACUTE PHARYNGITIS (ICD-462)    Route: IM    Site: RUOQ gluteus    Exp Date: 01/14    Lot #: [id number]    Mfr: [email or web address]    Comments: rocephin 500mg  given    Patient tolerated injection without complications    Given by: Adella Hare LPN (July 02, 2010 4:58 PM)  Injection # 2:    Medication: Depo- Medrol 80mg     Diagnosis: ACUTE PHARYNGITIS (ICD-462)    Route: IM    Site: RUOQ gluteus    Exp Date: 06/12    Lot #: [id number]  Mfr: Pharmacia    Patient tolerated injection without complications    Given by: Adella Hare LPN (July 02, 2010 4:59 PM)  Injection # 3:    Medication: Ketorolac-Toradol 15mg     Diagnosis: ACUTE PHARYNGITIS (ICD-462)    Route: IM    Site: RUOQ gluteus    Exp Date: 05/29/2011    Lot #: [id number]    Mfr: [email or web address]    Comments: toradol 60mg  given    Patient tolerated injection without complications    Given by: Adella Hare LPN (July 02, 2010 4:59 PM)  Orders Added: 1)  Est. Patient Level IV [45409] 2)  Rapid Strep [81191] 3)  Rocephin  250mg   [J0696] 4)  Depo- Medrol 80mg  [J1040] 5)  Ketorolac-Toradol 15mg  [J1885] 6)  Admin of Therapeutic Inj  intramuscular or subcutaneous [47829]

## 2010-08-29 NOTE — Letter (Signed)
Summary: dermatology  dermatology   Imported By: Lind Guest 08/07/2010 09:09:34  _____________________________________________________________________  External Attachment:    Type:   Image     Comment:   External Document

## 2010-08-29 NOTE — Letter (Signed)
Summary: Recall Office Visit  [facility]  [address]   [place], [place] [address]   Phone: [phone]  Fax: [phone]      July 31, 2010   Martha Montgomery [address] [place], [place]  [address] 04/27/1981   Dear Ms. Waguespack,   According to our records, it is time for you to schedule a follow-up office visit with [place].   At your convenience, please call [phone] to schedule an office visit. If you have any questions, concerns, or feel that this letter is in error, we would appreciate your call.   Sincerely,    Diana Eves  [facility] Ph: [phone]   Fax: [phone]

## 2010-09-16 ENCOUNTER — Telehealth: Payer: Self-pay | Admitting: Gastroenterology

## 2010-09-18 ENCOUNTER — Emergency Department (HOSPITAL_COMMUNITY)
Admission: EM | Admit: 2010-09-18 | Discharge: 2010-09-18 | Disposition: A | Discharge status: Home / Self Care | Payer: PRIVATE HEALTH INSURANCE | Attending: Emergency Medicine | Admitting: Emergency Medicine

## 2010-09-18 ENCOUNTER — Telehealth: Payer: Self-pay | Admitting: Family Medicine

## 2010-09-18 DIAGNOSIS — K2970 Gastritis, unspecified, without bleeding: Secondary | ICD-10-CM | POA: Insufficient documentation

## 2010-09-18 DIAGNOSIS — M549 Dorsalgia, unspecified: Secondary | ICD-10-CM | POA: Insufficient documentation

## 2010-09-18 LAB — DIFFERENTIAL
Eosinophils Absolute: 0 10*3/uL (ref 0.0–0.7)
Eosinophils Relative: 0 % (ref 0–5)
Lymphs Abs: 2.2 10*3/uL (ref 0.7–4.0)

## 2010-09-18 LAB — CBC
MCV: 89.2 fL (ref 78.0–100.0)
Platelets: 364 10*3/uL (ref 150–400)
RDW: 12.3 % (ref 11.5–15.5)
WBC: 6.2 10*3/uL (ref 4.0–10.5)

## 2010-09-19 ENCOUNTER — Telehealth (INDEPENDENT_AMBULATORY_CARE_PROVIDER_SITE_OTHER): Payer: Self-pay | Admitting: *Deleted

## 2010-09-24 ENCOUNTER — Encounter: Payer: Self-pay | Admitting: Gastroenterology

## 2010-09-24 ENCOUNTER — Ambulatory Visit (INDEPENDENT_AMBULATORY_CARE_PROVIDER_SITE_OTHER): Payer: PRIVATE HEALTH INSURANCE | Admitting: Gastroenterology

## 2010-09-24 DIAGNOSIS — M549 Dorsalgia, unspecified: Secondary | ICD-10-CM | POA: Insufficient documentation

## 2010-09-24 DIAGNOSIS — K589 Irritable bowel syndrome without diarrhea: Secondary | ICD-10-CM

## 2010-09-24 DIAGNOSIS — K2970 Gastritis, unspecified, without bleeding: Secondary | ICD-10-CM

## 2010-09-24 NOTE — Progress Notes (Signed)
----   Converted from flag ---- ---- 09/19/2010 8:53 AM, Diana Eves wrote: Doristine Church, can I bring this patient in Friday and double book your 1030 spot?  ---- 09/18/2010 3:46 PM, R. Roetta Sessions MD, Caleen Essex wrote: pt being seen in er by Dr. Bebe Shaggy. Pt c/o chest /abd pain; "tastes blood" and has seen blood; cbc pending; Ibuprofen recently added to her regimen;will probably be managed as out pt; I promised Dr. Bebe Shaggy we could see between tomorow and first of next week. ------------------------------  Appended Document:  pt is coming Tuesday 09/24/10 @1130  to see AS

## 2010-09-24 NOTE — Progress Notes (Signed)
Summary: ER  Phone Note Call from Patient   Summary of Call: pt called and stated she was spitting up blood. Said she keep doing it yesterday and stated that today when she swollowed she could taste blood and also when she burped she could. I let Dr. Lodema Hong and Merry Proud know and Dr. Lodema Hong said to go to ER. Patient was told to go to ER.  Initial call taken by: Rudene Anda,  September 18, 2010 12:00 PM

## 2010-09-24 NOTE — Progress Notes (Signed)
  Phone Note Call from Patient   Action Taken: Phone Call Completed Summary of Call: pt somewhat anxious. has hx of IBS. reports small pieces of stool over the past few days. feels like she can't completely evacuate. Denies abdominal pain or bloating. no brbpr. tries to eat diet high in fiber, but no fiber supplements. +probiotic. Hasn't tried miralax or anything of that nature. Pt also has a pain in lower back that radiates up to between shoulder blades. Feels achy, "fiery". Denies any  heavy lifting or anything strenuous.   As pt seems to be constipated without productive BM, take one dose of Miralax now. May repeat if needed.  Add supplemental fiber to diet daily Continue probiotic f/u with PCP regarding lower back discomfort. doubt if this is GI related. Pt to call if any abdominal pain, N/V, etc.

## 2010-09-26 ENCOUNTER — Encounter: Payer: Self-pay | Admitting: Gastroenterology

## 2010-09-26 ENCOUNTER — Encounter (INDEPENDENT_AMBULATORY_CARE_PROVIDER_SITE_OTHER): Payer: Self-pay

## 2010-09-26 DIAGNOSIS — R109 Unspecified abdominal pain: Secondary | ICD-10-CM | POA: Insufficient documentation

## 2010-09-27 ENCOUNTER — Ambulatory Visit: Payer: Self-pay | Admitting: Family Medicine

## 2010-09-27 ENCOUNTER — Encounter: Payer: Self-pay | Admitting: Family Medicine

## 2010-10-03 NOTE — Assessment & Plan Note (Signed)
Summary: abd pain,tastes blood,spitting up blood in mucus/RMR wants pt...   Vital Signs:  Patient profile:   30 year old female Menstrual status:  on Depo Height:      62 inches Weight:      131.50 pounds BMI:     24.14 Temp:     98.9 degrees F oral Pulse rate:   72 / minute BP sitting:   120 / 98  (left arm)  Vitals Entered By: Carolan Clines LPN (September 24, 2010 11:56 AM)  Visit Type:  Follow-up Visit Primary Care Provider:  Lodema Hong, M.D.    History of Present Illness: Pt presents today in f/u, was in ED around 2/22 secondary to blood in sputum. Has hx significant for IBS, gastritis documented by EGD in Nov 2011. Pt reports was given Ibuprofen secondary to back pain; took 2 hours prior to bed, and she immediately tasted "fresh blood" in her mouth. States looked at her tonsils, which were red. 15 minutes later burped and "tasted blood". Next morning woke up, 2/22, still tasting blood. Went to ED for evaluation. Denies abdominal pain, N/V. Avoiding NSAIDs now. No BC or Goodys. Given Dexilant at last visit, noticed improvement, would like full rx now.  States tryign wo avoid red sauces, watching diet. Hx of IBS with intermittent loose stool. Immediately has to have BM upon waking in am. Occasional lower abdominal cramping, consistent with IBS. No melena/hematochezia. Just started incorporating fiber, when she "can remember". Taking daily probiotic.  Would like to try levsin now.  c/o lower back pain, extends up to right beneath both scapular areas. Again, denies abd pain  H/H 14.3/40.4  Current Medications (verified): 1)  Metoprolol Succinate 50 Mg Xr24h-Tab (Metoprolol Succinate) .... Take 1 Tablet By Mouth Once A Day 2)  Celexa 20 Mg Tabs (Citalopram Hydrobromide) .... Once Daily 3)  Ativan 0.5 Mg Tabs (Lorazepam) .... As Needed 4)  Prilosec 20 Mg Cpdr (Omeprazole) .Marland Kitchen.. 1 By Mouth 30 Minutes Prior To First Meal. 5)  Fiorinal/codeine #3 50-325-40-30 Mg Caps (Butalbital-Asa-Caff-Codeine)  .... One Tab For Severe Migraine Only Max 2 Caps Per Episode 6)  Topamax 25 Mg Tabs (Topiramate) .... Tid 7)  Wellbutrin Xl 150 Mg Xr24h-Tab (Bupropion Hcl) .... Take One Once Daily 8)  Carafate 1 Gm/30ml Susp (Sucralfate) .... Qid  Allergies: 1)  ! Codeine 2)  ! * Acidic 3)  ! * Soy  Past History:  Past Medical History: Last updated: 08/22/2010 Hypertension x 6 years Anxiety disorder x 6 years Depression Migrainesx 6 years thyroid/goiter EGD Nov 2011: gastritis  Review of Systems General:  Denies fever, chills, and anorexia. Eyes:  Denies blurring, irritation, and discharge. ENT:  Denies sore throat, hoarseness, and difficulty swallowing. CV:  Denies chest pains and syncope. Resp:  Denies dyspnea at rest and wheezing. GI:  See HPI. GU:  Denies urinary burning and urinary frequency. MS:  Denies joint pain / LOM, joint swelling, and joint stiffness. Derm:  Denies rash, itching, and dry skin. Neuro:  Denies weakness and syncope. Psych:  Denies depression and anxiety. Endo:  Denies cold intolerance and heat intolerance. Heme:  Denies bruising and bleeding.  Physical Exam  General:  Well developed, well nourished, no acute distress. Head:  Normocephalic and atraumatic. Eyes:  PERRLA, no icterus. Abdomen:  +BS, soft, non-tender, non-distended. No HSM. no rebound or guarding Msk:  Symmetrical with no gross deformities. Normal posture. Neurologic:  Alert and  oriented x4;  grossly normal neurologically. Skin:  Intact without significant lesions or  rashes. Psych:  Alert and cooperative. Normal mood and affect.   Impression & Recommendations:  Problem # 1:  GASTRITIS (ICD-33.79)  30 year old African American pleasant female with hx of gastritis as noted on EGD in Nov 2011. Doing well on Dexilant. One recent episode of "tasting blood" in mouth. H/H stable, no anemia noted. Doubt UGI bleed at this time.   Continue Dexilant (rx sent to pharmacy) consider ifobt monitor for  any hematemesis f/u in 3 mos  Orders: Est. Patient Level II (72536)  Problem # 2:  IBS (ICD-564.1) hx of IBS, no melena or hematochezia. has been offered levsin in the past; would like to try now. alternating between constipation/loose stools. no abdominal pain.  Levsin rx sent to pharmacy Continue probiotic Fiber daily   Orders: T-Amylase (64403-47425) T-Lipase (95638-75643) T-CBC w/Diff ([id number]) Est. Patient Level II (60630)  Problem # 3:  BACK PAIN (ICD-33.89)  30 year old female with lower lumbar back discomfort, radiates up bilaterally to underneath scapula. No urinary symptoms. ? musckuloskeletal origin. doubt pancreatic or biliary component due to symptoms. However, will obtain baseline labs.    CBC already in chart, add CMP, amylase, lipase. Need UA F/U with PCP  Orders: Est. Patient Level II (16010) Prescriptions: LEVSIN/SL 0.125 MG SUBL (HYOSCYAMINE SULFATE) take 1 sublingual every 4 hours as needed  #120 x 3   Entered and Authorized by:   Gerrit Halls NP   Signed by:   Gerrit Halls NP on 09/24/2010   Method used:   Faxed to ...       [facility]  [address] (retail)       [address]       [place], [place]  [address]       Ph: [phone]       Fax: [phone]   RxID:   [id number] DEXILANT 60 MG CPDR (DEXLANSOPRAZOLE) take 1 by mouth daily  #31 x 3   Entered and Authorized by:   Gerrit Halls NP   Signed by:   Gerrit Halls NP on 09/24/2010   Method used:   Faxed to ...       [facility]  [address] (retail)       [address]       [place], [place]  [address]       Ph: [phone]       Fax: [phone]   RxID:   [id number]    Orders Added: 1)  T-Amylase [[id number]] 2)  T-Lipase [[id number]] 3)  T-CBC w/Diff [[id number]] 4)  Est. Patient Level II [53614]  Appended Document: abd pain,tastes blood,spitting up blood in mucus/RMR wants pt... can we obtain a UA on this nice lady. She said she had  one recently, but I do not see one on the chart. Thanks  Appended Document: abd pain,tastes blood,spitting up blood in mucus/RMR wants pt... lab order sent to pt  Appended Document: abd pain,tastes blood,spitting up blood in mucus/RMR wants pt... 3 MONTH F/U OPV IS IN THE COMPUTER

## 2010-10-03 NOTE — Letter (Signed)
Summary: Recall, Labs Needed  [facility]  [address]   [place], [place] [address]   Phone: [phone]  Fax: [phone]    September 26, 2010  ZIZA HASTINGS [address] [place], [place]  [address]  [place] [birth year]   Dear Ms. Lingard,   Our records indicate it is time to repeat your blood work.  You can take the enclosed form to the lab on or near the date indicated.  Please make note of the new location of the lab:   [address], 2nd floor   [facility]  Our office will call you within a week to ten business days with the results.  If you do not hear from [place] in 10 business days, you should call the office.  If you have any questions regarding this, call the office at [phone], and ask for the nurse.    Sincerely,    Hendricks Limes LPN  [facility] Ph: [phone]   Fax: [phone]

## 2010-10-03 NOTE — Miscellaneous (Signed)
Summary: Orders Update  Clinical Lists Changes  Problems: Added new problem of ABDOMINAL PAIN (ICD-789.00) Orders: Added new Test order of T-Urinalysis ([id number]) - Signed

## 2010-10-08 NOTE — Letter (Signed)
Summary: medical release  medical release   Imported By: Lind Guest 10/04/2010 11:43:18  _____________________________________________________________________  External Attachment:    Type:   Image     Comment:   External Document

## 2010-10-15 ENCOUNTER — Ambulatory Visit: Payer: PRIVATE HEALTH INSURANCE | Admitting: Family Medicine

## 2010-10-15 VITALS — BP 118/70 | Ht 62.0 in | Wt 130.0 lb

## 2010-10-15 DIAGNOSIS — Z309 Encounter for contraceptive management, unspecified: Secondary | ICD-10-CM

## 2010-10-15 MED ORDER — MEDROXYPROGESTERONE ACETATE 150 MG/ML IM SUSP
150.0000 mg | Freq: Once | INTRAMUSCULAR | Status: AC
  Administered 2010-10-15: 150 mg via INTRAMUSCULAR

## 2010-11-15 ENCOUNTER — Ambulatory Visit: Payer: Medicaid Other

## 2010-11-26 HISTORY — PX: COLONOSCOPY: SHX174

## 2010-12-03 ENCOUNTER — Encounter: Payer: Self-pay | Admitting: Gastroenterology

## 2010-12-07 ENCOUNTER — Emergency Department (HOSPITAL_COMMUNITY)
Admission: EM | Admit: 2010-12-07 | Discharge: 2010-12-07 | Disposition: A | Discharge status: Home / Self Care | Payer: PRIVATE HEALTH INSURANCE | Attending: Emergency Medicine | Admitting: Emergency Medicine

## 2010-12-07 DIAGNOSIS — K625 Hemorrhage of anus and rectum: Secondary | ICD-10-CM | POA: Insufficient documentation

## 2010-12-07 LAB — CBC
HCT: 39.6 % (ref 36.0–46.0)
Platelets: 383 10*3/uL (ref 150–400)
RDW: 12.5 % (ref 11.5–15.5)
WBC: 5.7 10*3/uL (ref 4.0–10.5)

## 2010-12-07 LAB — BASIC METABOLIC PANEL
BUN: 12 mg/dL (ref 6–23)
CO2: 24 mEq/L (ref 19–32)
Calcium: 10.2 mg/dL (ref 8.4–10.5)
Glucose, Bld: 101 mg/dL — ABNORMAL HIGH (ref 70–99)
Potassium: 3.6 mEq/L (ref 3.5–5.1)
Sodium: 140 mEq/L (ref 135–145)

## 2010-12-07 LAB — URINALYSIS, ROUTINE W REFLEX MICROSCOPIC
Leukocytes, UA: NEGATIVE
Nitrite: NEGATIVE
Specific Gravity, Urine: 1.02 (ref 1.005–1.030)
Urobilinogen, UA: 2 mg/dL — ABNORMAL HIGH (ref 0.0–1.0)
pH: 8.5 — ABNORMAL HIGH (ref 5.0–8.0)

## 2010-12-07 LAB — DIFFERENTIAL
Basophils Absolute: 0.1 10*3/uL (ref 0.0–0.1)
Basophils Relative: 1 % (ref 0–1)
Eosinophils Absolute: 0 10*3/uL (ref 0.0–0.7)
Eosinophils Relative: 1 % (ref 0–5)
Lymphocytes Relative: 33 % (ref 12–46)

## 2010-12-07 LAB — URINE MICROSCOPIC-ADD ON

## 2010-12-07 LAB — PREGNANCY, URINE: Preg Test, Ur: NEGATIVE

## 2010-12-10 ENCOUNTER — Ambulatory Visit (INDEPENDENT_AMBULATORY_CARE_PROVIDER_SITE_OTHER): Payer: Medicaid Other | Admitting: Urgent Care

## 2010-12-10 ENCOUNTER — Encounter: Payer: Self-pay | Admitting: Urgent Care

## 2010-12-10 VITALS — BP 115/81 | HR 73 | Temp 98.0°F | Ht 62.0 in | Wt 123.6 lb

## 2010-12-10 DIAGNOSIS — K2970 Gastritis, unspecified, without bleeding: Secondary | ICD-10-CM

## 2010-12-10 DIAGNOSIS — K2990 Gastroduodenitis, unspecified, without bleeding: Secondary | ICD-10-CM

## 2010-12-10 DIAGNOSIS — K625 Hemorrhage of anus and rectum: Secondary | ICD-10-CM | POA: Insufficient documentation

## 2010-12-10 DIAGNOSIS — R634 Abnormal weight loss: Secondary | ICD-10-CM | POA: Insufficient documentation

## 2010-12-10 MED ORDER — PEG 3350-KCL-NA BICARB-NACL 420 G PO SOLR: ORAL | Status: AC

## 2010-12-10 NOTE — Progress Notes (Signed)
Cc to PCP 

## 2010-12-10 NOTE — Assessment & Plan Note (Signed)
Martha Montgomery is a 30 y.o. black female w/ an episode of painless hematochezia in the setting of significant unintentional weight loss.  She Montgomery need colonoscopy to further evaluated & r/o colorectal ca, polyps, diverticular bleed or less likely inflammatory bowel disease.  She gives hx of recall during EGD w/ conscious sedation & being awake throughout procedure.  She has hx of multiple psychoactive medications, and many have been recently discontinued for pain, depression & anxiety.  Therefore, the procedure Montgomery need to be done with deep sedation (propofol) in the OR under the direction of anesthesia services.  I have discussed risks & benefits which include, but are not limited to, bleeding, infection, perforation & drug reaction.  The patient agrees with this plan & written consent Montgomery be obtained.

## 2010-12-10 NOTE — Progress Notes (Signed)
Primary Care Physician:  Syliva Overman, MD, MD Primary Gastroenterologist:  Dr. Darrick Penna  Chief Complaint  Patient presents with  . Rectal Bleeding    ER visit on Sat    HPI:  Martha Montgomery is a 30 y.o. female here as an established patient with a new problem of rectal bleeding.  3 days ago she went to ER at [facility] [place] [facility] for rectal bleeding.  Noticed large amt of bright red blood in stool, in commode & on tissue.  No clots.  CBC normal in ER, Met 7 normal & negative U preg.  BM 3 times per week.  Occ hard stools.  Occ straining.  Denies any abd pain.  Denies nausea or vomiting.  Denies fever or chills.  GERD well-controlled on omeprazole 20mg  daily.  Denies dysphagia or odynophagia,  Denies any hx hemorrhoids.  Denies proctalgia.  Appetite ok.  Weight loss 25# in past 5 months documented in medical record.  Taking midrin prn, maybe once/week for Headaches.  Denies any other NSAIDS.   Past Medical History  Diagnosis Date  . Hypertension x 6 years   . Anxiety disorder x6 years   . Depression   . Migraines x6 years   . Thyroid goiter   . Gastritis nov. 2011    EGD by Dr. Darrick Penna, negative h pylori    Past Surgical History  Procedure Date  . Cesarean section   . Abscesses removed from right buttock / left arm     Current Outpatient Prescriptions  Medication Sig Dispense Refill  . buPROPion (WELLBUTRIN SR) 150 MG 12 hr tablet Take 150 mg by mouth 2 (two) times daily.        . ergocalciferol (VITAMIN D2) 50000 UNITS capsule       . isometheptene-acetaminophen-dichloralphenazone (MIDRIN) 65-325-100 MG per capsule Take 1 capsule by mouth 4 (four) times daily as needed.        . metoprolol (TOPROL-XL) 50 MG 24 hr tablet Take 50 mg by mouth daily. Take one tablet by mouth once daily       . omeprazole (PRILOSEC) 40 MG capsule Take 40 mg by mouth daily. 1 by mouth 30 minutes prior to first meal       . topiramate (TOPAMAX) 50 MG tablet Take 50 mg by mouth 2 (two) times daily. Take one tablet by  mouth two times a day       . traMADol (ULTRAM) 50 MG tablet 50 mg as needed.      . polyethylene glycol-electrolytes (TRILYTE) 420 G solution Use as directed Also buy 1 fleet enema & 4 dulcolax tablets to use as directed  4000 mL  0  . DISCONTD: butalbital-aspirin-caffeine-codeine (FIORINAL/CODEINE #3) 50-325-40-30 MG capsule Take 1 capsule by mouth every 4 (four) hours as needed. One tablet for severe migraines only max 2 capsules per episode       . DISCONTD: citalopram (CELEXA) 20 MG tablet Take 20 mg by mouth daily. Once daily         . DISCONTD: dexlansoprazole (DEXILANT) 60 MG capsule Take 60 mg by mouth daily.        Marland Kitchen DISCONTD: hyoscyamine (LEVSIN, ANASPAZ) 0.125 MG tablet Take 0.125 mg by mouth every 4 (four) hours as needed.        Marland Kitchen DISCONTD: ibuprofen (ADVIL,MOTRIN) 800 MG tablet Take 800 mg by mouth. As needed       . DISCONTD: LORazepam (ATIVAN) 0.5 MG tablet Take 0.5 mg by mouth as needed.        Marland Kitchen  DISCONTD: sucralfate (CARAFATE) 1 GM/10ML suspension Take 1 g by mouth 4 (four) times daily.          Allergies as of 12/10/2010 - Review Complete 12/10/2010  Allergen Reaction Noted  . Codeine    . Soybean-containing drug products  12/10/2010    Family History: There is no known family history of colorectal carcinoma , liver disease, or inflammatory bowel disease.  Problem Relation Age of Onset  . Arthritis Mother   . Migraines Mother   . Hypertension Mother   . Asthma      family history     History   Social History  . Marital Status: Married    Spouse Name: N/A    Number of Children: 1  . Years of Education: N/A   Occupational History  . enrollment coordinator [employer]   .     Social History Main Topics  . Smoking status: Former Smoker -- 1.0 packs/day for 5 years    Types: Cigarettes    Quit date: 09/11/2010  . Smokeless tobacco: Not on file  . Alcohol Use: Yes     couple times per yr  . Drug Use: No  . Sexually Active: Yes --  Female partner(s)    Birth Control/ Protection: Other-see comments     Depo  Review of Systems: Gen: Denies any fever, chills, sweats, anorexia, fatigue, weakness, malaise, and sleep disorder CV: Denies chest pain, angina, palpitations, syncope, orthopnea, PND, peripheral edema, and claudication. Resp: Denies dyspnea at rest, dyspnea with exercise, cough, sputum, wheezing, coughing up blood, and pleurisy. GI: Denies vomiting blood, jaundice, and fecal incontinence.   Denies dysphagia or odynophagia. GU : Denies urinary burning, blood in urine, urinary frequency, urinary hesitancy, nocturnal urination, and urinary incontinence. MS: Denies joint pain, limitation of movement, and swelling, stiffness, low back pain, extremity pain. Denies muscle weakness, cramps, atrophy.  Derm: Denies rash, itching, dry skin, hives, moles, warts, or unhealing ulcers.  Psych: Denies depression, anxiety, memory loss, suicidal ideation, hallucinations, paranoia, and confusion. Heme: Denies bruising, bleeding, and enlarged lymph nodes.  Physical Exam: BP 115/81  Pulse 73  Temp(Src) 98 F (36.7 C) (Tympanic)  Ht 5\' 2"  (1.575 m)  Wt 123 lb 9.6 oz (56.065 kg)  BMI 22.61 kg/m2 General:   Alert,  Well-developed, well-nourished, pleasant and cooperative in NAD Head:  Normocephalic and atraumatic. Eyes:  Sclera clear, no icterus.   Conjunctiva pink. Ears:  Normal auditory acuity. Nose:  No deformity, discharge,  or lesions. Mouth:  No deformity or lesions, dentition normal. Neck:  Supple; no masses or thyromegaly. Lungs:  Clear throughout to auscultation.   No wheezes, crackles, or rhonchi. No acute distress. Heart:  Regular rate and rhythm; no murmurs, clicks, rubs,  or gallops. Abdomen:  Multiple straeie.  Soft, nontender and nondistended. No masses, hepatosplenomegaly or hernias noted. Normal bowel sounds, without guarding, and without rebound.   Rectal:  Deferred until time of colonoscopy.   Msk:  Symmetrical  without gross deformities. Normal posture. Pulses:  Normal pulses noted. Extremities:  Without clubbing or edema. Neurologic:  Alert and  oriented x4;  grossly normal neurologically. Skin:  Intact without significant lesions or rashes. Cervical Nodes:  No significant cervical adenopathy. Psych:  Alert and cooperative. Normal mood and affect.  '

## 2010-12-10 NOTE — Assessment & Plan Note (Signed)
25# weight loss in the past 5 months.  She has changed some medications, but otherwise denies any contributing lifestyle factors.  She states recent TSH normal.  Underlying colorectal malignancy will be ruled out.

## 2010-12-10 NOTE — Assessment & Plan Note (Signed)
Non-h pylori, on PPI.  Doing well.

## 2010-12-17 ENCOUNTER — Other Ambulatory Visit: Payer: Self-pay | Admitting: Gastroenterology

## 2010-12-17 ENCOUNTER — Encounter (HOSPITAL_COMMUNITY): Payer: PRIVATE HEALTH INSURANCE

## 2010-12-20 ENCOUNTER — Ambulatory Visit (HOSPITAL_COMMUNITY)
Admission: RE | Admit: 2010-12-20 | Discharge: 2010-12-20 | Disposition: A | Discharge status: Home / Self Care | Payer: PRIVATE HEALTH INSURANCE | Source: Ambulatory Visit | Attending: Gastroenterology | Admitting: Gastroenterology

## 2010-12-20 ENCOUNTER — Encounter: Payer: PRIVATE HEALTH INSURANCE | Admitting: Gastroenterology

## 2010-12-20 DIAGNOSIS — K648 Other hemorrhoids: Secondary | ICD-10-CM | POA: Insufficient documentation

## 2010-12-20 DIAGNOSIS — R197 Diarrhea, unspecified: Secondary | ICD-10-CM | POA: Insufficient documentation

## 2010-12-20 DIAGNOSIS — K625 Hemorrhage of anus and rectum: Secondary | ICD-10-CM

## 2010-12-20 DIAGNOSIS — I10 Essential (primary) hypertension: Secondary | ICD-10-CM | POA: Insufficient documentation

## 2010-12-20 DIAGNOSIS — Z79899 Other long term (current) drug therapy: Secondary | ICD-10-CM | POA: Insufficient documentation

## 2010-12-20 DIAGNOSIS — R634 Abnormal weight loss: Secondary | ICD-10-CM | POA: Insufficient documentation

## 2010-12-31 ENCOUNTER — Telehealth: Payer: Self-pay | Admitting: Family Medicine

## 2010-12-31 NOTE — Telephone Encounter (Signed)
Advised 01/07/11

## 2011-01-07 ENCOUNTER — Ambulatory Visit (INDEPENDENT_AMBULATORY_CARE_PROVIDER_SITE_OTHER): Payer: PRIVATE HEALTH INSURANCE | Admitting: Family Medicine

## 2011-01-07 VITALS — BP 122/82 | Wt 123.4 lb

## 2011-01-07 DIAGNOSIS — Z309 Encounter for contraceptive management, unspecified: Secondary | ICD-10-CM

## 2011-01-07 MED ORDER — MEDROXYPROGESTERONE ACETATE 150 MG/ML IM SUSP
150.0000 mg | Freq: Once | INTRAMUSCULAR | Status: AC
  Administered 2011-01-07: 150 mg via INTRAMUSCULAR

## 2011-01-07 NOTE — Progress Notes (Signed)
Depo provera given with no complications.  Next due around Sept 3rd.

## 2011-01-22 NOTE — Op Note (Signed)
  [name], [name]:  [id number]  MEDICAL RECORD NO.:  [id number]           PATIENT TYPE:  O  LOCATION:  [facility]                          FACILITY:  [facility]  PHYSICIAN:  Jonette Eva, M.D.     DATE OF BIRTH:  08-08-1980  DATE OF PROCEDURE:  12/20/2010 DATE OF DISCHARGE:                              OPERATIVE REPORT   REFERRING PROVIDER:  Milus Mallick. Lodema Hong, MD  PROCEDURE: ILEOCOLONOSCOPY  INDICATION FOR EXAM:  Martha Montgomery is a 30 year old female who has had unintentional weight loss and rectal bleeding.  She was last seen in November 2011 for an upper endoscopy.  She is complaining of dyspepsia and intermittent diarrhea and constipation.  She has significant depression.  FINDINGS: 1. Normal terminal ileum approximately 10 cm visualized. 2. Tortuous colon.  Otherwise normal colon without evidence of polyps,     masses, inflammatory changes, diverticula or AVMs. 3. Small internal hemorrhoids.  Otherwise normal retroflexed view of     the rectum.  DIAGNOSIS:  Rectal bleeding secondary to internal hemorrhoids.  RECOMMENDATIONS: 1. She should follow a high-fiber diet.  She is given handout on high-     fiber diet and hemorrhoids. 2. Follow up with Dr. Lodema Hong regarding her unintentional weight loss     which is most likely secondary due to depression.  No GI cause for     unexplained weight loss identified.  MEDICATIONS:  Propofol provided by Anesthesia.  PROCEDURE TECHNIQUE:  Physical exam was performed.  Informed consent was obtained from the patient after explaining the benefits, risks and alternatives to the procedure.  The patient was connected to the monitor and placed in left lateral position.  Continuous oxygen was provided by nasal cannula and IV medicine administered through an indwelling cannula.  After administration of sedation and rectal exam, the patient's rectum was intubated and scope was advanced under direct visualization to the  distal terminal ileum. The scope was removed slowly by carefully examining the color, texture, anatomy and integrity of the mucosa on the way out.  The patient was recovered in endoscopy and discharged home in satisfactory condition.     Jonette Eva, M.D.     SF/MEDQ  D:  12/20/2010  T:  12/21/2010  Job:  [id number]  cc:   Milus Mallick. Lodema Hong, M.D. Fax: [phone]  Electronically Signed by Jonette Eva M.D. on 01/22/2011 01:52:34 PM

## 2011-04-02 ENCOUNTER — Ambulatory Visit (INDEPENDENT_AMBULATORY_CARE_PROVIDER_SITE_OTHER): Payer: PRIVATE HEALTH INSURANCE | Admitting: Family Medicine

## 2011-04-02 VITALS — BP 100/74 | Wt 130.0 lb

## 2011-04-02 DIAGNOSIS — Z309 Encounter for contraceptive management, unspecified: Secondary | ICD-10-CM

## 2011-04-02 MED ORDER — MEDROXYPROGESTERONE ACETATE 150 MG/ML IM SUSP
150.0000 mg | Freq: Once | INTRAMUSCULAR | Status: AC
  Administered 2011-04-02: 150 mg via INTRAMUSCULAR

## 2011-04-02 NOTE — Progress Notes (Signed)
Patient received her depo provera injection in [facility]. There were no complications

## 2011-04-06 NOTE — Progress Notes (Signed)
  Subjective:    Patient ID: Martha Montgomery, female    DOB: [birth year], 30 y.o.   MRN: [id number]  HPI    Review of Systems     Objective:   Physical Exam        Assessment & Plan:  Depo provera administered on the 12 week cycle, no adverse reactions

## 2011-06-26 ENCOUNTER — Ambulatory Visit (INDEPENDENT_AMBULATORY_CARE_PROVIDER_SITE_OTHER): Payer: PRIVATE HEALTH INSURANCE

## 2011-06-26 VITALS — BP 134/84 | Wt 131.0 lb

## 2011-06-26 DIAGNOSIS — Z309 Encounter for contraceptive management, unspecified: Secondary | ICD-10-CM

## 2011-06-27 DIAGNOSIS — Z309 Encounter for contraceptive management, unspecified: Secondary | ICD-10-CM

## 2011-06-27 MED ORDER — MEDROXYPROGESTERONE ACETATE 150 MG/ML IM SUSP
150.0000 mg | Freq: Once | INTRAMUSCULAR | Status: AC
  Administered 2011-06-27: 150 mg via INTRAMUSCULAR

## 2011-08-18 DIAGNOSIS — G43719 Chronic migraine without aura, intractable, without status migrainosus: Secondary | ICD-10-CM | POA: Diagnosis not present

## 2011-08-18 DIAGNOSIS — I10 Essential (primary) hypertension: Secondary | ICD-10-CM | POA: Diagnosis not present

## 2011-08-18 DIAGNOSIS — Z79899 Other long term (current) drug therapy: Secondary | ICD-10-CM | POA: Diagnosis not present

## 2011-08-28 ENCOUNTER — Encounter: Payer: Self-pay | Admitting: Family Medicine

## 2011-08-29 ENCOUNTER — Ambulatory Visit (INDEPENDENT_AMBULATORY_CARE_PROVIDER_SITE_OTHER): Payer: Medicare Other | Admitting: Family Medicine

## 2011-08-29 ENCOUNTER — Encounter: Payer: Self-pay | Admitting: Family Medicine

## 2011-08-29 VITALS — BP 138/84 | HR 96 | Resp 16 | Ht 62.0 in | Wt 130.4 lb

## 2011-08-29 DIAGNOSIS — F17200 Nicotine dependence, unspecified, uncomplicated: Secondary | ICD-10-CM

## 2011-08-29 DIAGNOSIS — Z0000 Encounter for general adult medical examination without abnormal findings: Secondary | ICD-10-CM

## 2011-08-29 DIAGNOSIS — Z139 Encounter for screening, unspecified: Secondary | ICD-10-CM

## 2011-08-29 DIAGNOSIS — Z124 Encounter for screening for malignant neoplasm of cervix: Secondary | ICD-10-CM

## 2011-08-29 DIAGNOSIS — K219 Gastro-esophageal reflux disease without esophagitis: Secondary | ICD-10-CM

## 2011-08-29 DIAGNOSIS — K589 Irritable bowel syndrome without diarrhea: Secondary | ICD-10-CM | POA: Insufficient documentation

## 2011-08-29 MED ORDER — METOPROLOL SUCCINATE ER 50 MG PO TB24: 50.0000 mg | ORAL_TABLET | Freq: Every day | ORAL | Status: DC

## 2011-08-29 MED ORDER — DEXLANSOPRAZOLE 30 MG PO CPDR: 30.0000 mg | DELAYED_RELEASE_CAPSULE | Freq: Every day | ORAL | Status: AC

## 2011-08-29 MED ORDER — LUBIPROSTONE 8 MCG PO CAPS: 8.0000 ug | ORAL_CAPSULE | Freq: Two times a day (BID) | ORAL | Status: AC

## 2011-08-29 NOTE — Assessment & Plan Note (Signed)
Still using electric nicotine, hopes to quit by mid year

## 2011-08-29 NOTE — Patient Instructions (Signed)
F/u in 2 month  Please start for your constipation , amitiza, one daily , if this does not work increase to twice daily  New med for reflux, dexilant, one daily  Vitamin D lab today.   pls set a quit date for nicotine.  I recommend you get a flu vaccine at the pharmacy

## 2011-08-30 NOTE — Assessment & Plan Note (Signed)
Worsening symptoms of pain, bloating and constipation, trial of amitiza

## 2011-08-30 NOTE — Assessment & Plan Note (Signed)
Uncontrolled symptoms on omeprazole, which pt has discontinued, trial of dexilant, caffeine use is minimal

## 2011-08-30 NOTE — Progress Notes (Signed)
  Subjective:    Patient ID: Martha Montgomery, female    DOB: Mar 01, 1981, 31 y.o.   MRN: [id number]  HPI The PT is here for annual exam  and re-evaluation of chronic medical conditions, medication management and review of any available recent lab and radiology data.  Preventive health is updated, specifically  Cancer screening and Immunization.   Questions or concerns regarding consultations or procedures which the PT has had in the interim are  addressed. The PT denies any adverse reactions to current medications since the last visit. States that omeprazole provides no relief for her GERD symptoms, also c/o significant constipation and bloating, she does have IBS Needs to re establish with psych, not suicidal or homicidal, but c/o uncontrolled anxiety and panic attack in the past 5 months when EMS had to be called       Review of Systems See HPI Denies recent fever or chills. Denies sinus pressure, nasal congestion, ear pain or sore throat. Denies chest congestion, productive cough or wheezing. Denies chest pains, palpitations and leg swelling    Denies dysuria, frequency, hesitancy or incontinence. Denies joint pain, swelling and limitation in mobility. Denies headaches, seizures, numbness, or tingling.  Denies skin break down or rash.        Objective:   Physical Exam Pleasant well nourished female, alert and oriented x 3, in no cardio-pulmonary distress. Afebrile. HEENT No facial trauma or asymetry. Sinuses non tender.  EOMI, PERTL, fundoscopic exam is normal, no hemorhage or exudate.  External ears normal, tympanic membranes clear. Oropharynx moist, no exudate, good dentition. Neck: supple, no adenopathy,JVD or thyromegaly.No bruits.  Chest: Clear to ascultation bilaterally.No crackles or wheezes. Non tender to palpation  Breast: No asymetry,no masses. No nipple discharge or inversion. No axillary or supraclavicular adenopathy  Cardiovascular system; Heart sounds  normal,  S1 and  S2 ,no S3.  No murmur, or thrill. Apical beat not displaced Peripheral pulses normal.  Abdomen: Soft, mild epigastric tenderness, no organomegaly or masses. No bruits. Bowel sounds normal. No guarding, tenderness or rebound.  GU: External genitalia normal. No lesions. Vaginal canal normal.No discharge. Uterus normal size, no adnexal masses, no cervical motion or adnexal tenderness.  Musculoskeletal exam: Full ROM of spine, hips , shoulders and knees. No deformity ,swelling or crepitus noted. No muscle wasting or atrophy.   Neurologic: Cranial nerves 2 to 12 intact. Power, tone ,sensation and reflexes normal throughout. No disturbance in gait. No tremor.  Skin: Intact, no ulceration, erythema , scaling or rash noted. Pigmentation normal throughout  Psych; Normal mood and affect. Judgement and concentration normal        Assessment & Plan:

## 2011-09-12 ENCOUNTER — Ambulatory Visit (INDEPENDENT_AMBULATORY_CARE_PROVIDER_SITE_OTHER): Payer: Medicare Other

## 2011-09-12 VITALS — BP 122/80 | Wt 131.0 lb

## 2011-09-12 DIAGNOSIS — Z309 Encounter for contraceptive management, unspecified: Secondary | ICD-10-CM

## 2011-09-12 MED ORDER — MEDROXYPROGESTERONE ACETATE 150 MG/ML IM SUSP
150.0000 mg | Freq: Once | INTRAMUSCULAR | Status: AC
  Administered 2011-09-12: 150 mg via INTRAMUSCULAR

## 2011-10-01 DIAGNOSIS — F331 Major depressive disorder, recurrent, moderate: Secondary | ICD-10-CM | POA: Diagnosis not present

## 2011-10-28 ENCOUNTER — Encounter: Payer: Self-pay | Admitting: Family Medicine

## 2011-10-28 ENCOUNTER — Ambulatory Visit (INDEPENDENT_AMBULATORY_CARE_PROVIDER_SITE_OTHER): Payer: Medicare Other | Admitting: Family Medicine

## 2011-10-28 VITALS — BP 122/80 | HR 98 | Resp 18 | Ht 62.0 in | Wt 131.1 lb

## 2011-10-28 DIAGNOSIS — G43109 Migraine with aura, not intractable, without status migrainosus: Secondary | ICD-10-CM

## 2011-10-28 DIAGNOSIS — R5383 Other fatigue: Secondary | ICD-10-CM

## 2011-10-28 DIAGNOSIS — F329 Major depressive disorder, single episode, unspecified: Secondary | ICD-10-CM

## 2011-10-28 DIAGNOSIS — M949 Disorder of cartilage, unspecified: Secondary | ICD-10-CM

## 2011-10-28 DIAGNOSIS — Z13220 Encounter for screening for lipoid disorders: Secondary | ICD-10-CM

## 2011-10-28 DIAGNOSIS — R5381 Other malaise: Secondary | ICD-10-CM | POA: Diagnosis not present

## 2011-10-28 DIAGNOSIS — Z79899 Other long term (current) drug therapy: Secondary | ICD-10-CM

## 2011-10-28 DIAGNOSIS — F17200 Nicotine dependence, unspecified, uncomplicated: Secondary | ICD-10-CM

## 2011-10-28 DIAGNOSIS — K219 Gastro-esophageal reflux disease without esophagitis: Secondary | ICD-10-CM | POA: Diagnosis not present

## 2011-10-28 DIAGNOSIS — M899 Disorder of bone, unspecified: Secondary | ICD-10-CM

## 2011-10-28 NOTE — Patient Instructions (Signed)
F/u in 4 month  Fasting CBc, chem 7, lipid, TSH, vit D in 4 month  Please continue therapy as you are currently doing, You will continue to improve.   It is important that you exercise regularly at least 30 minutes 5 times a week. If you develop chest pain, have severe difficulty breathing, or feel very tired, stop exercising immediately and seek medical attention
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
## 2011-11-05 NOTE — Assessment & Plan Note (Signed)
Improved on topamax , continue same

## 2011-11-05 NOTE — Assessment & Plan Note (Signed)
Improving with therapy and med management through psych

## 2011-11-05 NOTE — Assessment & Plan Note (Signed)
Unchanged cessation counseling done 

## 2011-11-13 DIAGNOSIS — Z79899 Other long term (current) drug therapy: Secondary | ICD-10-CM | POA: Diagnosis not present

## 2011-11-13 DIAGNOSIS — I10 Essential (primary) hypertension: Secondary | ICD-10-CM | POA: Diagnosis not present

## 2011-11-13 DIAGNOSIS — G43719 Chronic migraine without aura, intractable, without status migrainosus: Secondary | ICD-10-CM | POA: Diagnosis not present

## 2011-11-20 DIAGNOSIS — F331 Major depressive disorder, recurrent, moderate: Secondary | ICD-10-CM | POA: Diagnosis not present

## 2011-11-26 ENCOUNTER — Ambulatory Visit (INDEPENDENT_AMBULATORY_CARE_PROVIDER_SITE_OTHER): Payer: Medicare Other

## 2011-11-26 VITALS — BP 124/80 | Wt 133.0 lb

## 2011-11-26 DIAGNOSIS — Z309 Encounter for contraceptive management, unspecified: Secondary | ICD-10-CM

## 2011-11-28 DIAGNOSIS — Z309 Encounter for contraceptive management, unspecified: Secondary | ICD-10-CM | POA: Diagnosis not present

## 2011-11-28 MED ORDER — MEDROXYPROGESTERONE ACETATE 150 MG/ML IM SUSP
150.0000 mg | Freq: Once | INTRAMUSCULAR | Status: AC
  Administered 2011-11-28: 150 mg via INTRAMUSCULAR

## 2011-12-18 DIAGNOSIS — F331 Major depressive disorder, recurrent, moderate: Secondary | ICD-10-CM | POA: Diagnosis not present

## 2011-12-18 DIAGNOSIS — F3189 Other bipolar disorder: Secondary | ICD-10-CM | POA: Diagnosis not present

## 2011-12-18 DIAGNOSIS — F410 Panic disorder [episodic paroxysmal anxiety] without agoraphobia: Secondary | ICD-10-CM | POA: Diagnosis not present

## 2012-02-03 ENCOUNTER — Encounter (HOSPITAL_COMMUNITY): Payer: Self-pay | Admitting: *Deleted

## 2012-02-03 ENCOUNTER — Emergency Department (HOSPITAL_COMMUNITY): Payer: Medicare Other

## 2012-02-03 ENCOUNTER — Emergency Department (HOSPITAL_COMMUNITY)
Admission: EM | Admit: 2012-02-03 | Discharge: 2012-02-03 | Disposition: A | Discharge status: Home / Self Care | Payer: Medicare Other | Attending: Emergency Medicine | Admitting: Emergency Medicine

## 2012-02-03 DIAGNOSIS — I10 Essential (primary) hypertension: Secondary | ICD-10-CM | POA: Insufficient documentation

## 2012-02-03 DIAGNOSIS — Z79899 Other long term (current) drug therapy: Secondary | ICD-10-CM | POA: Insufficient documentation

## 2012-02-03 DIAGNOSIS — F329 Major depressive disorder, single episode, unspecified: Secondary | ICD-10-CM | POA: Insufficient documentation

## 2012-02-03 DIAGNOSIS — M25569 Pain in unspecified knee: Secondary | ICD-10-CM | POA: Insufficient documentation

## 2012-02-03 DIAGNOSIS — T148XXA Other injury of unspecified body region, initial encounter: Secondary | ICD-10-CM | POA: Diagnosis not present

## 2012-02-03 DIAGNOSIS — F3289 Other specified depressive episodes: Secondary | ICD-10-CM | POA: Insufficient documentation

## 2012-02-03 DIAGNOSIS — F411 Generalized anxiety disorder: Secondary | ICD-10-CM | POA: Diagnosis not present

## 2012-02-03 DIAGNOSIS — S99919A Unspecified injury of unspecified ankle, initial encounter: Secondary | ICD-10-CM | POA: Diagnosis not present

## 2012-02-03 MED ORDER — HYDROCODONE-ACETAMINOPHEN 5-325 MG PO TABS: ORAL_TABLET | ORAL | Status: DC

## 2012-02-03 MED ORDER — HYDROCODONE-ACETAMINOPHEN 5-325 MG PO TABS
2.0000 | ORAL_TABLET | Freq: Once | ORAL | Status: AC
  Administered 2012-02-03: 2 via ORAL
  Filled 2012-02-03: qty 2

## 2012-02-03 MED ORDER — DICLOFENAC SODIUM 1 % TD GEL: TRANSDERMAL | Status: DC

## 2012-02-03 MED ORDER — IBUPROFEN 800 MG PO TABS: 800.0000 mg | ORAL_TABLET | Freq: Once | ORAL | Status: DC

## 2012-02-03 MED ORDER — PROMETHAZINE HCL 12.5 MG PO TABS
12.5000 mg | ORAL_TABLET | Freq: Once | ORAL | Status: AC
  Administered 2012-02-03: 12.5 mg via ORAL
  Filled 2012-02-03: qty 1

## 2012-02-03 NOTE — ED Provider Notes (Signed)
History     CSN: [id number]  Arrival date & time 02/03/12  1514   First MD Initiated Contact with Patient 02/03/12 1802      Chief Complaint  Patient presents with  . Knee Pain    (Consider location/radiation/quality/duration/timing/severity/associated sxs/prior treatment) Patient is a 31 y.o. female presenting with knee pain. The history is provided by the patient.  Knee Pain This is a recurrent problem. The current episode started today. The problem has been gradually worsening. Associated symptoms include arthralgias and headaches. Pertinent negatives include no abdominal pain, chest pain, coughing or neck pain. The symptoms are aggravated by standing and walking. She has tried nothing for the symptoms. The treatment provided no relief.    Past Medical History  Diagnosis Date  . Hypertension x 6 years   . Anxiety disorder x6 years   . Depression   . Migraines x6 years   . Thyroid goiter   . Gastritis nov. 2011    EGD by Dr. Darrick Penna, negative h pylori    Past Surgical History  Procedure Date  . Cesarean section   . Abscesses removed from right buttock / left arm     Family History  Problem Relation Age of Onset  . Arthritis Mother   . Migraines Mother   . Hypertension Mother   . Asthma      family history     History  Substance Use Topics  . Smoking status: Former Smoker -- 1.0 packs/day for 5 years    Types: Cigarettes    Quit date: 09/11/2010  . Smokeless tobacco: Not on file  . Alcohol Use: Yes     couple times per yr    OB History    Grav Para Term Preterm Abortions TAB SAB Ect Mult Living                  Review of Systems  Constitutional: Negative for activity change.       All ROS Neg except as noted in HPI  HENT: Negative for nosebleeds and neck pain.   Eyes: Negative for photophobia and discharge.  Respiratory: Negative for cough, shortness of breath and wheezing.   Cardiovascular: Negative for chest pain and palpitations.    Gastrointestinal: Negative for abdominal pain and blood in stool.  Genitourinary: Negative for dysuria, frequency and hematuria.  Musculoskeletal: Positive for arthralgias. Negative for back pain.  Skin: Negative.   Neurological: Positive for headaches. Negative for dizziness, seizures and speech difficulty.  Psychiatric/Behavioral: Negative for hallucinations and confusion. The patient is nervous/anxious.     Allergies  Codeine and Soybean-containing drug products  Home Medications   Current Outpatient Rx  Name Route Sig Dispense Refill  . BUPROPION HCL ER (SR) 150 MG PO TB12 Oral Take 150 mg by mouth 2 (two) times daily.      Marland Kitchen METOPROLOL SUCCINATE ER 50 MG PO TB24 Oral Take 1 tablet (50 mg total) by mouth daily. Take one tablet by mouth once daily 30 tablet 4  . OXYMETAZOLINE HCL 0.05 % NA SOLN Nasal Place 2 sprays into the nose daily as needed.    . TOPIRAMATE 50 MG PO TABS Oral Take 50 mg by mouth 2 (two) times daily. Take one tablet by mouth two times a day     . TRAMADOL HCL 50 MG PO TABS Oral Take 50 mg by mouth daily as needed.       BP 135/77  Pulse 74  Temp 98.6 F (37 C)  Resp 20  Ht 5\' 3"  (1.6 m)  Wt 125 lb (56.7 kg)  BMI 22.14 kg/m2  SpO2 100%  Physical Exam  Nursing note and vitals reviewed. Constitutional: She is oriented to person, place, and time. She appears well-developed and well-nourished.  Non-toxic appearance.  HENT:  Head: Normocephalic.  Right Ear: Tympanic membrane and external ear normal.  Left Ear: Tympanic membrane and external ear normal.  Eyes: EOM and lids are normal. Pupils are equal, round, and reactive to light.  Neck: Normal range of motion. Neck supple. Carotid bruit is not present.  Cardiovascular: Normal rate, regular rhythm, normal heart sounds, intact distal pulses and normal pulses.   Pulmonary/Chest: Breath sounds normal. No respiratory distress.  Abdominal: Soft. Bowel sounds are normal. There is no tenderness. There is no  guarding.  Musculoskeletal: Normal range of motion.       Minimal effusion noted of the left knee. Crepitus noted on attempted movement. No deformity of the quad or tibial tuberosity.  Pain with movement. Distal pulse wnl.   Lymphadenopathy:       Head (right side): No submandibular adenopathy present.       Head (left side): No submandibular adenopathy present.    She has no cervical adenopathy.  Neurological: She is alert and oriented to person, place, and time. She has normal strength. No cranial nerve deficit or sensory deficit.  Skin: Skin is warm and dry.  Psychiatric: She has a normal mood and affect. Her speech is normal.    ED Course  Procedures (including critical care time)  Labs Reviewed - No data to display Dg Knee Complete 4 Views Left  02/03/2012  *RADIOLOGY REPORT*  Clinical Data: Kneecap popped, pain.  LEFT KNEE - COMPLETE 4+ VIEW  Comparison:  None.  Findings:  There is no evidence of fracture, dislocation, or joint effusion.  There is no evidence of arthropathy or other focal bone abnormality.  Soft tissues are unremarkable.  IMPRESSION: Negative.  Original Report Authenticated By: Elsie Stain, M.D.     No diagnosis found.    MDM  I have reviewed nursing notes, vital signs, and all appropriate lab and imaging results for this patient. Pt has hx of knee problems. She is seen by Dr Romeo Apple in the past. No acute changes on xray. Pt fitted with immobilizer and crutches. Rx for voltaren and norco given to the patient.       Kathie Dike, [place] 02/03/12 [phone]

## 2012-02-03 NOTE — ED Notes (Signed)
Pt states that she came out of the bathroom, went to take another step and her left knee "popped " out of place, has had problems with the tendons and ligaments, has been seeing Dr. Romeo Apple in the past. Pt was able to "place" left knee back into place, pt continues to have pain to left knee, pt has splint in place from EMS transport, cms intact distal.

## 2012-02-04 ENCOUNTER — Telehealth: Payer: Self-pay | Admitting: Family Medicine

## 2012-02-04 ENCOUNTER — Other Ambulatory Visit: Payer: Self-pay | Admitting: Family Medicine

## 2012-02-04 DIAGNOSIS — M25569 Pain in unspecified knee: Secondary | ICD-10-CM

## 2012-02-04 NOTE — Telephone Encounter (Signed)
Refer based on complaint,and she has seen him before, i will put in referral, pls

## 2012-02-04 NOTE — ED Provider Notes (Signed)
Medical screening examination/treatment/procedure(s) were performed by non-physician practitioner and as supervising physician I was immediately available for consultation/collaboration.  Orvis Stann, MD 02/04/12 0857 

## 2012-02-09 DIAGNOSIS — Z79899 Other long term (current) drug therapy: Secondary | ICD-10-CM | POA: Diagnosis not present

## 2012-02-09 DIAGNOSIS — M25569 Pain in unspecified knee: Secondary | ICD-10-CM | POA: Diagnosis not present

## 2012-02-09 DIAGNOSIS — G43719 Chronic migraine without aura, intractable, without status migrainosus: Secondary | ICD-10-CM | POA: Diagnosis not present

## 2012-02-16 ENCOUNTER — Ambulatory Visit (INDEPENDENT_AMBULATORY_CARE_PROVIDER_SITE_OTHER): Payer: Medicare Other

## 2012-02-16 VITALS — BP 116/74 | Wt 127.1 lb

## 2012-02-16 DIAGNOSIS — Z309 Encounter for contraceptive management, unspecified: Secondary | ICD-10-CM

## 2012-02-16 MED ORDER — MEDROXYPROGESTERONE ACETATE 150 MG/ML IM SUSP
150.0000 mg | Freq: Once | INTRAMUSCULAR | Status: AC
  Administered 2012-02-16: 150 mg via INTRAMUSCULAR

## 2012-02-16 NOTE — Progress Notes (Signed)
Pt in to receive depo provera 150mg  im.  Injection given in right gluteal.  No signs or symptoms of adverse reaction.  No voiced complaints.  Pt due to return on October 14th for next injection

## 2012-03-01 ENCOUNTER — Ambulatory Visit: Payer: Medicare Other | Admitting: Family Medicine

## 2012-03-09 ENCOUNTER — Other Ambulatory Visit: Payer: Self-pay | Admitting: Orthopedic Surgery

## 2012-03-09 ENCOUNTER — Ambulatory Visit (INDEPENDENT_AMBULATORY_CARE_PROVIDER_SITE_OTHER): Payer: Medicare Other | Admitting: Orthopedic Surgery

## 2012-03-09 ENCOUNTER — Encounter: Payer: Self-pay | Admitting: Orthopedic Surgery

## 2012-03-09 VITALS — BP 122/80 | Ht 63.0 in | Wt 120.0 lb

## 2012-03-09 DIAGNOSIS — M2390 Unspecified internal derangement of unspecified knee: Secondary | ICD-10-CM

## 2012-03-09 MED ORDER — TRAMADOL-ACETAMINOPHEN 37.5-325 MG PO TABS: 1.0000 | ORAL_TABLET | ORAL | Status: DC | PRN

## 2012-03-09 NOTE — Progress Notes (Signed)
  Subjective:    Patient ID: Martha Montgomery, female    DOB: [birth year], 31 y.o.   MRN: [id number]  HPI Comments: Patient presents with:   Knee Pain - left knee pain, last seen for this in 2010  This patient is now 31 years old I saw her in 2010 for anterior knee pain syndrome and subluxation of the patella with hypermobility she presents back with giving way episodes of the knee a recent patellar subluxation manually reduced. She has continued difficulties with sitting continue difficulties with the knee giving way despite wearing her brace. She last had physical therapy 2 years ago.  On system review she is noted to have a history of weight loss no weight gain no chest pain or shortness of breath no nausea or vomiting denies kidney complaints history of migraines history of goiter history of depression mood swings anxiety and panic attacks history of seasonal allergies denies vision problems eczema or lymph node disease    Knee Pain       Review of Systems     Objective:   Physical Exam  Constitutional: She is oriented to person, place, and time. She appears well-developed and well-nourished.  Cardiovascular: Intact distal pulses.   Lymphadenopathy:    She has no cervical adenopathy.  Neurological: She is alert and oriented to person, place, and time. She has normal reflexes.  Skin: Skin is warm and dry.  Psychiatric: She has a normal mood and affect. Her behavior is normal. Judgment and thought content normal.  Right Knee Exam   Tenderness  The patient is experiencing tenderness in the medial retinaculum and patella.  Range of Motion  The patient has normal right knee ROM. Extension: 15 (15 hyperextension,)  Flexion: normal   Muscle Strength   The patient has normal right knee strength.  Tests  McMurray:  Medial - negative Lateral - negative Lachman:  Anterior - positive    Posterior - positive Drawer:       Anterior - negative     Varus: negative Valgus:  negative Pivot Shift: negative Patellar Apprehension: positive  Other  Erythema: absent Scars: absent Sensation: normal Swelling: none   Left Knee Exam   Tenderness  The patient is experiencing tenderness in the medial retinaculum and patella.  Range of Motion  The patient has normal left knee ROM. Extension: 15 (15 hyperextension)  Flexion: normal   Muscle Strength   The patient has normal left knee strength.  Tests  McMurray:  Medial - negative Lateral - negative Lachman:  Anterior - positive    Posterior - positive Drawer:       Anterior - negative      Varus: negative  Pivot Shift: negative Patellar Apprehension: positive  Other  Erythema: absent Scars: absent Sensation: normal Swelling: none            Assessment & Plan:  Radiographs were taken at the hospital 4 views of the knee show no abnormality  She does say has hyperlipidemia laxity she has patella femoral subluxation. She actually has bilateral disease the left is symptomatic the right has not as symptomatic.  Recommend CT scan to assess for patella tilt and femoral rotation  Her Q angle was 22 bilaterally her sulcus angle is approximately 12  The question is whether states tibial tubercle transfer and allograft medial reefing/reconstruction  Start Ultracet for pain continue bracing

## 2012-03-09 NOTE — Patient Instructions (Addendum)
CT SCAN

## 2012-03-10 ENCOUNTER — Ambulatory Visit (HOSPITAL_COMMUNITY): Payer: Medicare Other

## 2012-03-10 ENCOUNTER — Ambulatory Visit: Payer: Medicare Other

## 2012-03-10 ENCOUNTER — Ambulatory Visit (HOSPITAL_COMMUNITY)
Admission: RE | Admit: 2012-03-10 | Discharge: 2012-03-10 | Disposition: A | Discharge status: Home / Self Care | Payer: Medicare Other | Source: Ambulatory Visit | Attending: Orthopedic Surgery | Admitting: Orthopedic Surgery

## 2012-03-10 DIAGNOSIS — M2390 Unspecified internal derangement of unspecified knee: Secondary | ICD-10-CM | POA: Diagnosis not present

## 2012-03-10 DIAGNOSIS — S83006A Unspecified dislocation of unspecified patella, initial encounter: Secondary | ICD-10-CM | POA: Insufficient documentation

## 2012-03-10 DIAGNOSIS — X58XXXA Exposure to other specified factors, initial encounter: Secondary | ICD-10-CM | POA: Insufficient documentation

## 2012-03-17 ENCOUNTER — Ambulatory Visit (INDEPENDENT_AMBULATORY_CARE_PROVIDER_SITE_OTHER): Payer: Medicare Other | Admitting: Orthopedic Surgery

## 2012-03-17 ENCOUNTER — Encounter: Payer: Self-pay | Admitting: Orthopedic Surgery

## 2012-03-17 VITALS — BP 148/90 | Ht 63.0 in | Wt 120.0 lb

## 2012-03-17 DIAGNOSIS — M24469 Recurrent dislocation, unspecified knee: Secondary | ICD-10-CM

## 2012-03-17 NOTE — Patient Instructions (Addendum)
You have been scheduled for OUTPATIENT surgery  Please Go to your preoperative appointment and bring the folder that was given to you today  Please stop all blood thinners ibuprofen Naprosyn aspirin Plavix Coumadin  You have been scheduled for surgery.  All surgeries carry some risk.  Remember you always have the option of continued nonsurgical treatment. However in this situation the risks vs. the benefits favor surgery as the best treatment option. The risks of the surgery includes the following but is not limited to bleeding, infection, pulmonary embolus, death from anesthesia, nerve injury vascular injury or need for further surgery, continued pain.  Specific to this procedure the following risks and complications are rare but possible Stiffness Pain  infection Instability

## 2012-03-17 NOTE — Progress Notes (Signed)
Patient ID: Martha Montgomery, female   DOB: 09/16/1980, 31 y.o.   MRN: [id number] Chief Complaint  Patient presents with  . Results    review bilateral knee ct scan    The patient's CT scan shows tilt and subluxation of the patella bilaterally, LEFT worse than RIGHT.  We have discussed this and she has decided to proceed with reconstructive surgery with arthroscopy of the LEFT knee and proximal realignment with lateral release and medial reefing With. Open technique.  See further details of the history and physical, which is incorporate our reference

## 2012-03-17 NOTE — Addendum Note (Signed)
Addended by: Vickki Hearing on: 03/17/2012 05:32 PM   Modules accepted: Orders

## 2012-03-19 ENCOUNTER — Encounter (HOSPITAL_COMMUNITY): Payer: Self-pay | Admitting: Pharmacy Technician

## 2012-03-23 NOTE — Patient Instructions (Signed)
20 Martha Montgomery  03/23/2012   Your procedure is scheduled on:  04/02/12  Report to [facility] AM.  Call this number if you have problems the morning of surgery: [phone]   Remember:   Do not eat food:After Midnight.  May have clear liquids:until Midnight .  Clear liquids include soda, tea, black coffee, apple or grape juice, broth.  Take these medicines the morning of surgery with A SIP OF WATER: wellbutrin, metoprolol, topamax   Do not wear jewelry, make-up or nail polish.  Do not wear lotions, powders, or perfumes. You may wear deodorant.  Do not shave 48 hours prior to surgery. Men may shave face and neck.  Do not bring valuables to the hospital.  Contacts, dentures or bridgework may not be worn into surgery.  Leave suitcase in the car. After surgery it may be brought to your room.  For patients admitted to the hospital, checkout time is 11:00 AM the day of discharge.   Patients discharged the day of surgery will not be allowed to drive home.  Name and phone number of your driver: family  Special Instructions: CHG Shower Use Special Wash: 1/2 bottle night before surgery and 1/2 bottle morning of surgery.   Please read over the following fact sheets that you were given: Pain Booklet, MRSA Information, Surgical Site Infection Prevention, Anesthesia Post-op Instructions and Care and Recovery After Surgery   PATIENT INSTRUCTIONS POST-ANESTHESIA  IMMEDIATELY FOLLOWING SURGERY:  Do not drive or operate machinery for the first twenty four hours after surgery.  Do not make any important decisions for twenty four hours after surgery or while taking narcotic pain medications or sedatives.  If you develop intractable nausea and vomiting or a severe headache please notify your doctor immediately.  FOLLOW-UP:  Please make an appointment with your surgeon as instructed. You do not need to follow up with anesthesia unless specifically instructed to do so.  WOUND CARE INSTRUCTIONS (if  applicable):  Keep a dry clean dressing on the anesthesia/puncture wound site if there is drainage.  Once the wound has quit draining you may leave it open to air.  Generally you should leave the bandage intact for twenty four hours unless there is drainage.  If the epidural site drains for more than 36-48 hours please call the anesthesia department.  QUESTIONS?:  Please feel free to call your physician or the hospital operator if you have any questions, and they will be happy to assist you.      Arthroscopic Procedure, Knee An arthroscopic procedure can find what is wrong with your knee. PROCEDURE Arthroscopy is a surgical technique that allows your orthopedic surgeon to diagnose and treat your knee injury with accuracy. They will look into your knee through a small instrument. This is almost like a small (pencil sized) telescope. Because arthroscopy affects your knee less than open knee surgery, you can anticipate a more rapid recovery. Taking an active role by following your caregiver's instructions will help with rapid and complete recovery. Use crutches, rest, elevation, ice, and knee exercises as instructed. The length of recovery depends on various factors including type of injury, age, physical condition, medical conditions, and your rehabilitation. Your knee is the joint between the large bones (femur and tibia) in your leg. Cartilage covers these bone ends which are smooth and slippery and allow your knee to bend and move smoothly. Two menisci, thick, semi-lunar shaped pads of cartilage which form a rim inside the joint, help absorb shock and  stabilize your knee. Ligaments bind the bones together and support your knee joint. Muscles move the joint, help support your knee, and take stress off the joint itself. Because of this all programs and physical therapy to rehabilitate an injured or repaired knee require rebuilding and strengthening your muscles. AFTER THE PROCEDURE  After the procedure,  you will be moved to a recovery area until most of the effects of the medication have worn off. Your caregiver will discuss the test results with you.   Only take over-the-counter or prescription medicines for pain, discomfort, or fever as directed by your caregiver.  SEEK MEDICAL CARE IF:   You have increased bleeding from your wounds.   You see redness, swelling, or have increasing pain in your wounds.   You have pus coming from your wound.   You have an oral temperature above 102 F (38.9 C).   You notice a bad smell coming from the wound or dressing.   You have severe pain with any motion of your knee.  SEEK IMMEDIATE MEDICAL CARE IF:   You develop a rash.   You have difficulty breathing.   You have any allergic problems.  Document Released: 07/11/2000 Document Revised: 07/03/2011 Document Reviewed: 02/02/2008 [facility] Patient Information 2012 [place], [place].

## 2012-03-24 ENCOUNTER — Other Ambulatory Visit: Payer: Self-pay

## 2012-03-24 ENCOUNTER — Encounter (HOSPITAL_COMMUNITY)
Admission: RE | Admit: 2012-03-24 | Discharge: 2012-03-24 | Disposition: A | Discharge status: Home / Self Care | Payer: Medicare Other | Source: Ambulatory Visit | Attending: Orthopedic Surgery | Admitting: Orthopedic Surgery

## 2012-03-24 ENCOUNTER — Encounter (HOSPITAL_COMMUNITY): Payer: Self-pay

## 2012-03-24 DIAGNOSIS — M2240 Chondromalacia patellae, unspecified knee: Secondary | ICD-10-CM | POA: Diagnosis not present

## 2012-03-24 DIAGNOSIS — I10 Essential (primary) hypertension: Secondary | ICD-10-CM | POA: Diagnosis not present

## 2012-03-24 DIAGNOSIS — Z01812 Encounter for preprocedural laboratory examination: Secondary | ICD-10-CM | POA: Diagnosis not present

## 2012-03-24 DIAGNOSIS — Z01810 Encounter for preprocedural cardiovascular examination: Secondary | ICD-10-CM | POA: Diagnosis not present

## 2012-03-24 DIAGNOSIS — M238X9 Other internal derangements of unspecified knee: Secondary | ICD-10-CM | POA: Diagnosis not present

## 2012-03-24 LAB — BASIC METABOLIC PANEL
BUN: 12 mg/dL (ref 6–23)
Creatinine, Ser: 0.81 mg/dL (ref 0.50–1.10)
GFR calc Af Amer: >90 mL/min (ref >90)
GFR calc non Af Amer: >90 mL/min (ref >90)

## 2012-03-24 LAB — HEMOGLOBIN AND HEMATOCRIT, BLOOD: Hemoglobin: 13.2 g/dL (ref 12.0–15.0)

## 2012-04-01 NOTE — H&P (Signed)
Subjective:   Patient ID: Martha Montgomery, female DOB: [birth year], 31 y.o. MRN: [id number]  HPI Comments: Patient presents with:  Knee Pain - left knee pain, last seen for this in 2010  This patient is now 31 years old I saw her in 2010 for anterior knee pain syndrome and subluxation of the patella with hypermobility she presents back with giving way episodes of the knee a recent patellar subluxation manually reduced. She has continued difficulties with sitting continue difficulties with the knee giving way despite wearing her brace. She last had physical therapy 2 years ago.  On system review she is noted to have a history of weight loss no weight gain no chest pain or shortness of breath no nausea or vomiting denies kidney complaints history of migraines history of goiter history of depression mood swings anxiety and panic attacks history of seasonal allergies denies vision problems eczema or lymph node disease   Past Medical History  Diagnosis Date  . Hypertension x 6 years   . Anxiety disorder x6 years   . Depression   . Migraines x6 years   . Thyroid goiter   . Gastritis nov. 2011    EGD by Dr. Darrick Penna, negative h pylori    Past Surgical History  Procedure Date  . Cesarean section   . Abscesses removed from right buttock / left arm     History   Social History  . Marital Status: Married    Spouse Name: N/A    Number of Children: 1  . Years of Education: N/A   Occupational History  . enrollment coordinator [employer]   .     Social History Main Topics  . Smoking status: Former Smoker -- 1.0 packs/day for 5 years    Types: Cigarettes    Quit date: 09/11/2010  . Smokeless tobacco: Not on file  . Alcohol Use: Yes     couple times per yr  . Drug Use: No  . Sexually Active: Yes -- Female partner(s)    Birth Control/ Protection: Other-see comments     Depo   Other Topics Concern  . Not on file   Social History Narrative   Pt is currently on medicaid.  From [place] and came to [place] in mid 2010    family history includes Arthritis in her mother; Asthma in an unspecified family member; Hypertension in her mother; and Migraines in her mother.  Knee Pain  Review of Systems  Objective:   Physical Exam  Constitutional: She is oriented to person, place, and time. She appears well-developed and well-nourished.  Cardiovascular: Intact distal pulses.  Lymphadenopathy:  She has no cervical adenopathy.  Neurological: She is alert and oriented to person, place, and time. She has normal reflexes.  Skin: Skin is warm and dry.  Psychiatric: She has a normal mood and affect. Her behavior is normal. Judgment and thought content normal.  Right Knee Exam  Tenderness  The patient is experiencing tenderness in the medial retinaculum and patella.  Range of Motion  The patient has normal right knee ROM.  Extension: 15 (15 hyperextension,)  Flexion: normal  Muscle Strength  The patient has normal right knee strength.  Tests  McMurray: Medial - negative Lateral - negative  Lachman: Anterior - positive Posterior - positive  Drawer: Anterior - negative  Varus: negative  Valgus: negative  Pivot Shift: negative  Patellar Apprehension: positive  Other  Erythema: absent  Scars: absent  Sensation: normal  Swelling: none  Left  Knee Exam  Tenderness  The patient is experiencing tenderness in the medial retinaculum and patella.  Range of Motion  The patient has normal left knee ROM.  Extension: 15 (15 hyperextension)  Flexion: normal  Muscle Strength  The patient has normal left knee strength.  Tests  McMurray: Medial - negative Lateral - negative  Lachman: Anterior - positive Posterior - positive  Drawer: Anterior - negative  Varus: negative  Pivot Shift: negative  Patellar Apprehension: positive  Other  Erythema: absent  Scars: absent  Sensation: normal  Swelling: none  Assessment & Plan:   Radiographs were taken at the hospital 4 views of the  knee show no abnormality  She does hyper laxity she has patella femoral subluxation. She actually has bilateral disease the left is symptomatic the right has not as symptomatic.  CT scan show dysplasia and subluxation  SALK PROXIMAL REALIGNMENT

## 2012-04-02 ENCOUNTER — Encounter (HOSPITAL_COMMUNITY)
Admission: RE | Disposition: A | Discharge status: Home / Self Care | Payer: Self-pay | Source: Ambulatory Visit | Attending: Orthopedic Surgery

## 2012-04-02 ENCOUNTER — Encounter (HOSPITAL_COMMUNITY): Payer: Self-pay | Admitting: Anesthesiology

## 2012-04-02 ENCOUNTER — Ambulatory Visit (HOSPITAL_COMMUNITY): Payer: Medicare Other | Admitting: Anesthesiology

## 2012-04-02 ENCOUNTER — Encounter (HOSPITAL_COMMUNITY): Payer: Self-pay

## 2012-04-02 ENCOUNTER — Ambulatory Visit (HOSPITAL_COMMUNITY)
Admission: RE | Admit: 2012-04-02 | Discharge: 2012-04-02 | Disposition: A | Discharge status: Home / Self Care | Payer: Medicare Other | Source: Ambulatory Visit | Attending: Orthopedic Surgery | Admitting: Orthopedic Surgery

## 2012-04-02 DIAGNOSIS — Z01812 Encounter for preprocedural laboratory examination: Secondary | ICD-10-CM | POA: Diagnosis not present

## 2012-04-02 DIAGNOSIS — M238X9 Other internal derangements of unspecified knee: Secondary | ICD-10-CM | POA: Diagnosis not present

## 2012-04-02 DIAGNOSIS — M2390 Unspecified internal derangement of unspecified knee: Secondary | ICD-10-CM

## 2012-04-02 DIAGNOSIS — I10 Essential (primary) hypertension: Secondary | ICD-10-CM | POA: Insufficient documentation

## 2012-04-02 DIAGNOSIS — M2240 Chondromalacia patellae, unspecified knee: Secondary | ICD-10-CM | POA: Diagnosis not present

## 2012-04-02 DIAGNOSIS — S83006A Unspecified dislocation of unspecified patella, initial encounter: Secondary | ICD-10-CM | POA: Diagnosis not present

## 2012-04-02 DIAGNOSIS — Z01810 Encounter for preprocedural cardiovascular examination: Secondary | ICD-10-CM | POA: Insufficient documentation

## 2012-04-02 HISTORY — PX: CHONDROPLASTY: SHX5177

## 2012-04-02 SURGERY — REPAIR, TENDON, PATELLAR, ARTHROSCOPIC
Anesthesia: General | Site: Knee | Laterality: Left | Wound class: Clean

## 2012-04-02 MED ORDER — ONDANSETRON HCL 4 MG/2ML IJ SOLN
INTRAMUSCULAR | Status: AC
  Filled 2012-04-02: qty 2

## 2012-04-02 MED ORDER — EPINEPHRINE HCL 1 MG/ML IJ SOLN
INTRAMUSCULAR | Status: AC
  Filled 2012-04-02: qty 5

## 2012-04-02 MED ORDER — ONDANSETRON HCL 4 MG/2ML IJ SOLN: 4.0000 mg | Freq: Once | INTRAMUSCULAR | Status: DC

## 2012-04-02 MED ORDER — FENTANYL CITRATE 0.05 MG/ML IJ SOLN
INTRAMUSCULAR | Status: AC
  Filled 2012-04-02: qty 2

## 2012-04-02 MED ORDER — GLYCOPYRROLATE 0.2 MG/ML IJ SOLN
INTRAMUSCULAR | Status: AC
  Filled 2012-04-02: qty 1

## 2012-04-02 MED ORDER — SODIUM CHLORIDE 0.9 % IR SOLN
Status: DC | PRN
  Administered 2012-04-02: 1000 mL

## 2012-04-02 MED ORDER — DIPHENHYDRAMINE HCL 50 MG/ML IJ SOLN
12.5000 mg | Freq: Once | INTRAMUSCULAR | Status: AC
  Administered 2012-04-02: 12.5 mg via INTRAVENOUS

## 2012-04-02 MED ORDER — TRAMADOL HCL 50 MG PO TABS
50.0000 mg | ORAL_TABLET | Freq: Once | ORAL | Status: AC
  Administered 2012-04-02: 50 mg via ORAL

## 2012-04-02 MED ORDER — DIPHENHYDRAMINE HCL 50 MG/ML IJ SOLN
INTRAMUSCULAR | Status: AC
  Filled 2012-04-02: qty 1

## 2012-04-02 MED ORDER — KETOROLAC TROMETHAMINE 30 MG/ML IJ SOLN
30.0000 mg | Freq: Once | INTRAMUSCULAR | Status: AC
  Administered 2012-04-02: 30 mg via INTRAVENOUS

## 2012-04-02 MED ORDER — GLYCOPYRROLATE 0.2 MG/ML IJ SOLN
0.2000 mg | Freq: Once | INTRAMUSCULAR | Status: AC
  Administered 2012-04-02: 0.2 mg via INTRAVENOUS

## 2012-04-02 MED ORDER — FENTANYL CITRATE 0.05 MG/ML IJ SOLN
25.0000 ug | INTRAMUSCULAR | Status: DC | PRN
  Administered 2012-04-02 (×4): 50 ug via INTRAVENOUS

## 2012-04-02 MED ORDER — ACETAMINOPHEN 10 MG/ML IV SOLN
1000.0000 mg | Freq: Once | INTRAVENOUS | Status: AC
  Administered 2012-04-02: 1000 mg via INTRAVENOUS

## 2012-04-02 MED ORDER — HYDROMORPHONE HCL 4 MG PO TABS: 4.0000 mg | ORAL_TABLET | ORAL | Status: AC | PRN

## 2012-04-02 MED ORDER — LIDOCAINE HCL (CARDIAC) 10 MG/ML IV SOLN
INTRAVENOUS | Status: DC | PRN
  Administered 2012-04-02: 10 mg via INTRAVENOUS

## 2012-04-02 MED ORDER — SODIUM CHLORIDE 0.9 % IR SOLN
Status: DC | PRN
  Administered 2012-04-02 (×3)

## 2012-04-02 MED ORDER — BUPIVACAINE-EPINEPHRINE 0.5% -1:200000 IJ SOLN
INTRAMUSCULAR | Status: DC | PRN
  Administered 2012-04-02: 60 mL

## 2012-04-02 MED ORDER — ACETAMINOPHEN 10 MG/ML IV SOLN
INTRAVENOUS | Status: AC
  Filled 2012-04-02: qty 100

## 2012-04-02 MED ORDER — MIDAZOLAM HCL 2 MG/2ML IJ SOLN
1.0000 mg | INTRAMUSCULAR | Status: DC | PRN
  Administered 2012-04-02: 2 mg via INTRAVENOUS

## 2012-04-02 MED ORDER — TRAMADOL HCL 50 MG PO TABS
ORAL_TABLET | ORAL | Status: AC
  Filled 2012-04-02: qty 1

## 2012-04-02 MED ORDER — PROPOFOL 10 MG/ML IV BOLUS
INTRAVENOUS | Status: DC | PRN
  Administered 2012-04-02: 150 mg via INTRAVENOUS
  Administered 2012-04-02: 30 mg via INTRAVENOUS

## 2012-04-02 MED ORDER — BUPIVACAINE-EPINEPHRINE PF 0.5-1:200000 % IJ SOLN
INTRAMUSCULAR | Status: AC
  Filled 2012-04-02: qty 20

## 2012-04-02 MED ORDER — MIDAZOLAM HCL 2 MG/2ML IJ SOLN
INTRAMUSCULAR | Status: AC
  Filled 2012-04-02: qty 2

## 2012-04-02 MED ORDER — LACTATED RINGERS IV SOLN
INTRAVENOUS | Status: DC
  Administered 2012-04-02: 08:00:00 via INTRAVENOUS

## 2012-04-02 MED ORDER — ONDANSETRON HCL 4 MG/2ML IJ SOLN
4.0000 mg | Freq: Once | INTRAMUSCULAR | Status: AC | PRN
  Administered 2012-04-02: 4 mg via INTRAVENOUS

## 2012-04-02 MED ORDER — ONDANSETRON HCL 4 MG/2ML IJ SOLN
4.0000 mg | Freq: Once | INTRAMUSCULAR | Status: AC
  Administered 2012-04-02: 4 mg via INTRAVENOUS

## 2012-04-02 MED ORDER — PROMETHAZINE HCL 12.5 MG PO TABS: 12.5000 mg | ORAL_TABLET | Freq: Four times a day (QID) | ORAL | Status: DC | PRN

## 2012-04-02 MED ORDER — CEFAZOLIN SODIUM-DEXTROSE 2-3 GM-% IV SOLR
INTRAVENOUS | Status: AC
  Filled 2012-04-02: qty 50

## 2012-04-02 MED ORDER — CHLORHEXIDINE GLUCONATE 4 % EX LIQD: 60.0000 mL | Freq: Once | CUTANEOUS | Status: DC

## 2012-04-02 MED ORDER — FENTANYL CITRATE 0.05 MG/ML IJ SOLN
INTRAMUSCULAR | Status: DC | PRN
  Administered 2012-04-02: 25 ug via INTRAVENOUS
  Administered 2012-04-02: 100 ug via INTRAVENOUS
  Administered 2012-04-02: 50 ug via INTRAVENOUS
  Administered 2012-04-02: 25 ug via INTRAVENOUS

## 2012-04-02 MED ORDER — CEFAZOLIN SODIUM-DEXTROSE 2-3 GM-% IV SOLR
2.0000 g | INTRAVENOUS | Status: AC
  Administered 2012-04-02: 2 g via INTRAVENOUS

## 2012-04-02 MED ORDER — KETOROLAC TROMETHAMINE 30 MG/ML IJ SOLN
INTRAMUSCULAR | Status: AC
  Filled 2012-04-02: qty 1

## 2012-04-02 SURGICAL SUPPLY — 94 items
ADH SKN CLS APL DERMABOND .7 (GAUZE/BANDAGES/DRESSINGS) ×1
BANDAGE ELASTIC 6 VELCRO NS (GAUZE/BANDAGES/DRESSINGS) IMPLANT
BANDAGE ESMARK 6X9 LF (GAUZE/BANDAGES/DRESSINGS) ×1 IMPLANT
BIT DRILL 2.4X128 (BIT) IMPLANT
BIT DRILL 2.8X128 (BIT) ×1 IMPLANT
BLADE AGGRESSIVE PLUS 4.0 (BLADE) ×1 IMPLANT
BLADE OSC/SAGITTAL MD 9X18.5 (BLADE) ×1 IMPLANT
BLADE SURG SZ10 CARB STEEL (BLADE) ×4 IMPLANT
BLADE SURG SZ11 CARB STEEL (BLADE) ×2 IMPLANT
BNDG CMPR 9X6 STRL LF SNTH (GAUZE/BANDAGES/DRESSINGS) ×1
BNDG ESMARK 6X9 LF (GAUZE/BANDAGES/DRESSINGS) ×2
BRACE T-SCOPE KNEE POSTOP (MISCELLANEOUS) ×1 IMPLANT
BUR 5.0 BARRELL (BURR) IMPLANT
BUR AGGRESSIVE PLUS 5.0 (BURR) ×1 IMPLANT
BUR BARRELL 4.0 (BURR) IMPLANT
BUR ROUND 5.0 (BURR) IMPLANT
CATH KIT ON Q 2.5IN SLV (PAIN MANAGEMENT) ×1 IMPLANT
CHLORAPREP W/TINT 26ML (MISCELLANEOUS) ×3 IMPLANT
CLOTH BEACON ORANGE TIMEOUT ST (SAFETY) ×2 IMPLANT
COOLER CRYO CUFF IC AND MOTOR (MISCELLANEOUS) ×2 IMPLANT
COVER LIGHT HANDLE STERIS (MISCELLANEOUS) ×4 IMPLANT
COVER PROBE W GEL 5X96 (DRAPES) ×1 IMPLANT
CUFF CRYO KNEE LG 20X31 COOLER (ORTHOPEDIC SUPPLIES) ×1 IMPLANT
CUFF CRYO KNEE18X23 MED (MISCELLANEOUS) ×2 IMPLANT
CUFF TOURNIQUET SINGLE 24IN (TOURNIQUET CUFF) ×1 IMPLANT
CUFF TOURNIQUET SINGLE 34IN LL (TOURNIQUET CUFF) ×1 IMPLANT
DECANTER SPIKE VIAL GLASS SM (MISCELLANEOUS) ×2 IMPLANT
DERMABOND ADVANCED (GAUZE/BANDAGES/DRESSINGS) ×1
DERMABOND ADVANCED .7 DNX12 (GAUZE/BANDAGES/DRESSINGS) IMPLANT
ELECT REM PT RETURN 9FT ADLT (ELECTROSURGICAL) ×2
ELECTRODE REM PT RTRN 9FT ADLT (ELECTROSURGICAL) ×1 IMPLANT
FLOOR PAD 36X40 (MISCELLANEOUS)
GAUZE SPONGE 4X4 16PLY XRAY LF (GAUZE/BANDAGES/DRESSINGS) ×2 IMPLANT
GAUZE XEROFORM 5X9 LF (GAUZE/BANDAGES/DRESSINGS) ×2 IMPLANT
GLOVE BIO SURGEON STRL SZ 6.5 (GLOVE) ×1 IMPLANT
GLOVE BIOGEL PI IND STRL 7.0 (GLOVE) IMPLANT
GLOVE BIOGEL PI INDICATOR 7.0 (GLOVE) ×3
GLOVE ECLIPSE 6.5 STRL STRAW (GLOVE) ×1 IMPLANT
GLOVE EXAM NITRILE MD LF STRL (GLOVE) ×1 IMPLANT
GLOVE SKINSENSE NS SZ8.0 LF (GLOVE) ×1
GLOVE SKINSENSE STRL SZ8.0 LF (GLOVE) ×1 IMPLANT
GLOVE SS N UNI LF 8.5 STRL (GLOVE) ×2 IMPLANT
GOWN STRL REIN XL XLG (GOWN DISPOSABLE) ×7 IMPLANT
HLDR LEG FOAM (MISCELLANEOUS) ×1 IMPLANT
IMMOBILIZER KNEE 19 UNV (ORTHOPEDIC SUPPLIES) ×1 IMPLANT
INST SET MINOR BONE (KITS) ×2 IMPLANT
IV NS IRRIG 3000ML ARTHROMATIC (IV SOLUTION) ×10 IMPLANT
KIT BLADEGUARD II DBL (SET/KITS/TRAYS/PACK) ×2 IMPLANT
KIT ROOM TURNOVER AP CYSTO (KITS) ×2 IMPLANT
KIT TRANSTIBIAL (DISPOSABLE) ×2 IMPLANT
LEG HOLDER FOAM (MISCELLANEOUS)
MANIFOLD NEPTUNE II (INSTRUMENTS) ×2 IMPLANT
MARKER SKIN DUAL TIP RULER LAB (MISCELLANEOUS) ×2 IMPLANT
NDL HYPO 18GX1.5 BLUNT FILL (NEEDLE) IMPLANT
NDL HYPO 21X1.5 SAFETY (NEEDLE) ×1 IMPLANT
NDL MAYO 6 CRC TAPER PT (NEEDLE) IMPLANT
NDL SPNL 18GX3.5 QUINCKE PK (NEEDLE) ×1 IMPLANT
NEEDLE HYPO 18GX1.5 BLUNT FILL (NEEDLE) ×4 IMPLANT
NEEDLE HYPO 21X1.5 SAFETY (NEEDLE) ×2 IMPLANT
NEEDLE MAYO 6 CRC TAPER PT (NEEDLE) ×2 IMPLANT
NEEDLE SPNL 18GX3.5 QUINCKE PK (NEEDLE) ×2 IMPLANT
NS IRRIG 1000ML POUR BTL (IV SOLUTION) ×2 IMPLANT
PACK ARTHRO LIMB DRAPE STRL (MISCELLANEOUS) ×2 IMPLANT
PACK BASIC III (CUSTOM PROCEDURE TRAY) ×2
PACK SRG BSC III STRL LF ECLPS (CUSTOM PROCEDURE TRAY) ×1 IMPLANT
PAD ABD 5X9 TENDERSORB (GAUZE/BANDAGES/DRESSINGS) ×2 IMPLANT
PAD ARMBOARD 7.5X6 YLW CONV (MISCELLANEOUS) ×2 IMPLANT
PAD FLOOR 36X40 (MISCELLANEOUS) ×1 IMPLANT
PADDING CAST COTTON 6X4 STRL (CAST SUPPLIES) ×1 IMPLANT
PENCIL HANDSWITCHING (ELECTRODE) ×2 IMPLANT
SET ARTHROSCOPY INST (INSTRUMENTS) ×2 IMPLANT
SET ARTHROSCOPY PUMP TUBE (IRRIGATION / IRRIGATOR) ×2 IMPLANT
SET BASIN LINEN APH (SET/KITS/TRAYS/PACK) ×2 IMPLANT
SPONGE GAUZE 4X4 12PLY (GAUZE/BANDAGES/DRESSINGS) ×1 IMPLANT
SPONGE LAP 18X18 X RAY DECT (DISPOSABLE) ×2 IMPLANT
STAPLER VISISTAT 35W (STAPLE) ×1 IMPLANT
STRIP CLOSURE SKIN 1/2X4 (GAUZE/BANDAGES/DRESSINGS) ×2 IMPLANT
SUT ETHIBOND 5 LR DA (SUTURE) ×2 IMPLANT
SUT ETHIBOND NAB OS 4 #2 30IN (SUTURE) ×4 IMPLANT
SUT ETHILON 3 0 FSL (SUTURE) ×1 IMPLANT
SUT MON AB 0 CT1 (SUTURE) ×1 IMPLANT
SUT MON AB 2-0 CT1 36 (SUTURE) ×2 IMPLANT
SUT PROLENE 3 0 PS 2 (SUTURE) IMPLANT
SUT TICRON COATED BLUE 2 0 30 (SUTURE) IMPLANT
SUT VIC AB 1 CT1 27 (SUTURE)
SUT VIC AB 1 CT1 27XBRD ANTBC (SUTURE) ×1 IMPLANT
SYR 30ML LL (SYRINGE) ×2 IMPLANT
SYR BULB IRRIGATION 50ML (SYRINGE) ×4 IMPLANT
SYRINGE 10CC LL (SYRINGE) ×1 IMPLANT
TOWEL OR 17X26 4PK STRL BLUE (TOWEL DISPOSABLE) ×2 IMPLANT
WAND 50 DEG COVAC W/CORD (SURGICAL WAND) IMPLANT
WAND 90 DEG TURBOVAC W/CORD (SURGICAL WAND) IMPLANT
YANKAUER SUCT 12FT TUBE ARGYLE (SUCTIONS) ×2 IMPLANT
YANKAUER SUCT BULB TIP 10FT TU (MISCELLANEOUS) ×6 IMPLANT

## 2012-04-02 NOTE — Progress Notes (Signed)
Awake. C/O itching. No rash. Dr Tollie Eth consulted. Order given. Med as noted.

## 2012-04-02 NOTE — Anesthesia Postprocedure Evaluation (Signed)
Anesthesia Post Note  Patient: Martha Montgomery  Procedure(s) Performed: Procedure(s) (LRB): KNEE ARTHROSCOPY WITH MEDIAL PATELLAR FEMORAL LIGAMENT RECONSTRUCTION (Left) CHONDROPLASTY (Left)  Anesthesia type: General  Patient location: PACU  Post pain: Pain level controlled  Post assessment: Post-op Vital signs reviewed, Patient's Cardiovascular Status Stable, Respiratory Function Stable, Patent Airway, No signs of Nausea or vomiting and Pain level controlled  Last Vitals:  Filed Vitals:   04/02/12 1104  BP: 124/82  Pulse: 84  Temp: 36.9 C  Resp: 20    Post vital signs: Reviewed and stable  Level of consciousness: awake and alert   Complications: No apparent anesthesia complications

## 2012-04-02 NOTE — Interval H&P Note (Signed)
History and Physical Interval Note:  04/02/2012 8:57 AM  Martha Montgomery  has presented today for surgery, with the diagnosis of patellofemoral instability left  The various methods of treatment have been discussed with the patient and family. After consideration of risks, benefits and other options for treatment, the patient has consented to  Procedure(s) (LRB) with comments: KNEE ARTHROSCOPY WITH MEDIAL PATELLAR FEMORAL LIGAMENT RECONSTRUCTION (Left) as a surgical intervention .  The patient's history has been reviewed, patient examined, no change in status, stable for surgery.  I have reviewed the patient's chart and labs.  Questions were answered to the patient's satisfaction.     Fuller Canada

## 2012-04-02 NOTE — Interval H&P Note (Signed)
History and Physical Interval Note:  04/02/2012 9:00 AM  Martha Montgomery  has presented today for surgery, with the diagnosis of patellofemoral instability left  The various methods of treatment have been discussed with the patient and family. After consideration of risks, benefits and other options for treatment, the patient has consented to  Procedure(s) (LRB) with comments: KNEE ARTHROSCOPY WITH MEDIAL PATELLAR FEMORAL LIGAMENT RECONSTRUCTION (Left) as a surgical intervention .  The patient's history has been reviewed, patient examined, no change in status, stable for surgery.  I have reviewed the patient's chart and labs.  Questions were answered to the patient's satisfaction.     Fuller Canada

## 2012-04-02 NOTE — Anesthesia Preprocedure Evaluation (Signed)
Anesthesia Evaluation  Patient identified by MRN, date of birth, ID band Patient awake    Reviewed: Allergy & Precautions, H&P , NPO status , Patient's Chart, lab work & pertinent test results, reviewed documented beta blocker date and time   Airway Mallampati: I TM Distance: >3 FB Neck ROM: Full    Dental No notable dental hx. (+) Teeth Intact   Pulmonary    Pulmonary exam normal       Cardiovascular hypertension, Pt. on medications and Pt. on home beta blockers Rhythm:Regular Rate:Normal     Neuro/Psych  Headaches, PSYCHIATRIC DISORDERS Depression    GI/Hepatic Neg liver ROS, GERD-  Medicated and Controlled,  Endo/Other  negative endocrine ROS  Renal/GU negative Renal ROS     Musculoskeletal negative musculoskeletal ROS (+)   Abdominal Normal abdominal exam  (+)   Peds  Hematology negative hematology ROS (+)   Anesthesia Other Findings   Reproductive/Obstetrics                           Anesthesia Physical Anesthesia Plan  ASA: II  Anesthesia Plan: General   Post-op Pain Management:    Induction: Intravenous  Airway Management Planned: LMA  Additional Equipment:   Intra-op Plan:   Post-operative Plan:   Informed Consent: I have reviewed the patients History and Physical, chart, labs and discussed the procedure including the risks, benefits and alternatives for the proposed anesthesia with the patient or authorized representative who has indicated his/her understanding and acceptance.     Plan Discussed with: CRNA  Anesthesia Plan Comments:         Anesthesia Quick Evaluation

## 2012-04-02 NOTE — Op Note (Signed)
Operative report  Date of surgery 04/02/2012  Preop diagnosis patellar instability left knee  Postop diagnosis same, chondromalacia patella  Procedure arthroscopy left knee chondroplasty of the patella and medial patellofemoral ligament reconstruction  Surgeon Romeo Apple  Assisted by [place] Nation  Operative findings grade 2 chondral lesion medial facet patella, subluxation of the patella laterally. Gross laxity of the patella under anesthesia.  Details of procedure: The patient was identified in the preop area and the surgical site was confirmed and marked. The chart was reviewed.  The patient was taken to the operating room given weight appropriate dose of Ancef and had general anesthesia. Exam under anesthesia was then performed. The patella was subluxated double and dislocatable out of the trochlear groove and extension as well as 30 of flexion. The knee was otherwise stable with Genu recurvatum; severe.  The timeout procedure was performed the surgical site was confirmed along with the patient identification. The antibiotics were confirmed as given within one hour skin incision and the equipment for the procedure was present.  Lateral portal was established a diagnostic arthroscopy was performed. A medial portal was then established and a chondroplasty was done of the medial facet. The joint surfaces were normal other than the medial patellar facet and a slight chondral fissure at the lateral edge of the trochlea.  The knee was taken to arrange of motion in the patella never centered into the trochlear groove.  We then exsanguinated the knee with a six-inch Esmarch the tourniquet was inflated to 300 mm of mercury. A medial incision was made between the medial epicondyle and the lateral border of the patella this was taken down to the fascial layer. Meticulous dissection was then performed layer by layer preserving the capsule and the medial patellofemoral ligament was identified. Several  sutures #2 Ethibond were placed in the medial patellofemoral ligament and a reefing was performed. With the sutures held firm the knee was taken through range of motion and and the patella was tested for subluxation. The patella was centered in the trochlear groove at 30 of flexion and was not dislocatable or subluxation.  The sutures are then tied with the knee at 40 flexion.  The scope was placed back into the joint. The patella centered in the trochlear groove with knee flexion. And it was not dislocatable.   The wound is irrigated and then closed with 2 layers of 2-0 Monocryl.  Steri-Strips were applied to the wound  The lateral portal was closed with 3-0 nylon interrupted suture.  The joint was injected with 60 cc of Marcaine.  Sterile dressing was applied. Tourniquet was released. The Cryo/Cuff was activated and started.  The patient was extubated and taken to recovery room in stable condition  The plan is for weightbearing as tolerated 0 to 90 of knee flexion for 6 weeks. Quadriceps isometric exercises can be started next week

## 2012-04-02 NOTE — Transfer of Care (Signed)
Immediate Anesthesia Transfer of Care Note  Patient: Martha Montgomery  Procedure(s) Performed: Procedure(s) (LRB): KNEE ARTHROSCOPY WITH MEDIAL PATELLAR FEMORAL LIGAMENT RECONSTRUCTION (Left) CHONDROPLASTY (Left)  Patient Location: PACU  Anesthesia Type: General  Level of Consciousness: awake  Airway & Oxygen Therapy: Patient Spontanous Breathing and non-rebreather face mask  Post-op Assessment: Report given to PACU RN, Post -op Vital signs reviewed and stable and Patient moving all extremities  Post vital signs: Reviewed and stable  Complications: No apparent anesthesia complications

## 2012-04-02 NOTE — Brief Op Note (Signed)
04/02/2012  11:01 AM  PATIENT:  Martha Montgomery  31 y.o. female  PRE-OPERATIVE DIAGNOSIS:  patellofemoral instability left  POST-OPERATIVE DIAGNOSIS:  patellofemoral instability left, chondromalacia patella  PROCEDURE:  Procedure(s) (LRB) with comments: KNEE ARTHROSCOPY WITH MEDIAL PATELLAR FEMORAL LIGAMENT RECONSTRUCTION (Left) CHONDROPLASTY (Left) - of left patella  SURGEON:  Surgeon(s) and Role:    * Vickki Hearing, MD - Primary  PHYSICIAN ASSISTANT:   ASSISTANTS: BETTY ASHLEY   ANESTHESIA:   general  EBL:  Total I/O In: 900 [I.V.:900] Out: 0   BLOOD ADMINISTERED:none  DRAINS: none   LOCAL MEDICATIONS USED:  MARCAINE   , Amount: 60 ml and OTHER EPI  SPECIMEN:  No Specimen  DISPOSITION OF SPECIMEN:  N/A  COUNTS:  YES  TOURNIQUET:  * Missing tourniquet times found for documented tourniquets in log:  [id number] *  DICTATION: .Dragon Dictation  PLAN OF CARE: Discharge to home after PACU  PATIENT DISPOSITION:  PACU - hemodynamically stable.   Delay start of Pharmacological VTE agent (>24hrs) due to surgical blood loss or risk of bleeding: not applicable

## 2012-04-02 NOTE — Anesthesia Procedure Notes (Signed)
Procedure Name: LMA Insertion Date/Time: 04/02/2012 9:35 AM Performed by: Franco Nones Pre-anesthesia Checklist: Patient identified, Patient being monitored, Emergency Drugs available, Timeout performed and Suction available Patient Re-evaluated:Patient Re-evaluated prior to inductionOxygen Delivery Method: Circle System Utilized Preoxygenation: Pre-oxygenation with 100% oxygen Intubation Type: IV induction Ventilation: Mask ventilation without difficulty LMA: LMA inserted LMA Size: 4.0 Number of attempts: 1 Placement Confirmation: positive ETCO2 and breath sounds checked- equal and bilateral

## 2012-04-05 ENCOUNTER — Ambulatory Visit (INDEPENDENT_AMBULATORY_CARE_PROVIDER_SITE_OTHER): Payer: Medicare Other | Admitting: Orthopedic Surgery

## 2012-04-05 ENCOUNTER — Encounter: Payer: Self-pay | Admitting: Orthopedic Surgery

## 2012-04-05 VITALS — BP 120/82 | Ht 63.0 in | Wt 120.0 lb

## 2012-04-05 DIAGNOSIS — Z9889 Other specified postprocedural states: Secondary | ICD-10-CM

## 2012-04-05 DIAGNOSIS — S83006A Unspecified dislocation of unspecified patella, initial encounter: Secondary | ICD-10-CM

## 2012-04-05 NOTE — Progress Notes (Signed)
Patient ID: STARKEISHA VANWINKLE, female   DOB: [birth year], 31 y.o.   MRN: [id number] Chief Complaint  Patient presents with  . Follow-up    Post op #1, left knee.    Operative report  Date of surgery 04/02/2012  Preop diagnosis patellar instability left knee  Postop diagnosis same, chondromalacia patella  Procedure arthroscopy left knee chondroplasty of the patella and medial patellofemoral ligament reconstruction  Surgeon Romeo Apple  Assisted by [place] Nation  Operative findings grade 2 chondral lesion medial facet patella, subluxation of the patella laterally. Gross laxity of the patella under anesthesia.    one suture was removed. Dressing was changed. Brace reapplied. Physical therapy orders.  Follow up 18th and 19th and the ends of the suture which is absorbable

## 2012-04-05 NOTE — Patient Instructions (Addendum)
WEAR BRACE WHEN WALKING   START PT THIS WEEK

## 2012-04-06 ENCOUNTER — Encounter (HOSPITAL_COMMUNITY): Payer: Self-pay | Admitting: Orthopedic Surgery

## 2012-04-08 ENCOUNTER — Telehealth: Payer: Self-pay | Admitting: Family Medicine

## 2012-04-08 NOTE — Telephone Encounter (Signed)
Would this patient qualify? Is she really homebound?

## 2012-04-09 NOTE — Telephone Encounter (Signed)
TRY AND SEE IF THEY WILL DO IT

## 2012-04-12 ENCOUNTER — Other Ambulatory Visit: Payer: Self-pay | Admitting: *Deleted

## 2012-04-12 ENCOUNTER — Ambulatory Visit (HOSPITAL_COMMUNITY): Payer: Medicare Other | Admitting: Physical Therapy

## 2012-04-12 DIAGNOSIS — S83006A Unspecified dislocation of unspecified patella, initial encounter: Secondary | ICD-10-CM

## 2012-04-12 DIAGNOSIS — Z9889 Other specified postprocedural states: Secondary | ICD-10-CM

## 2012-04-12 NOTE — Telephone Encounter (Signed)
[facility]  

## 2012-04-13 DIAGNOSIS — R269 Unspecified abnormalities of gait and mobility: Secondary | ICD-10-CM | POA: Diagnosis not present

## 2012-04-13 DIAGNOSIS — M6281 Muscle weakness (generalized): Secondary | ICD-10-CM | POA: Diagnosis not present

## 2012-04-13 DIAGNOSIS — Z4789 Encounter for other orthopedic aftercare: Secondary | ICD-10-CM | POA: Diagnosis not present

## 2012-04-13 DIAGNOSIS — IMO0001 Reserved for inherently not codable concepts without codable children: Secondary | ICD-10-CM | POA: Diagnosis not present

## 2012-04-15 ENCOUNTER — Ambulatory Visit (INDEPENDENT_AMBULATORY_CARE_PROVIDER_SITE_OTHER): Payer: Medicare Other | Admitting: Orthopedic Surgery

## 2012-04-15 ENCOUNTER — Encounter: Payer: Self-pay | Admitting: Orthopedic Surgery

## 2012-04-15 VITALS — BP 120/80 | Ht 63.0 in | Wt 120.0 lb

## 2012-04-15 DIAGNOSIS — Z4789 Encounter for other orthopedic aftercare: Secondary | ICD-10-CM | POA: Diagnosis not present

## 2012-04-15 DIAGNOSIS — IMO0001 Reserved for inherently not codable concepts without codable children: Secondary | ICD-10-CM | POA: Diagnosis not present

## 2012-04-15 DIAGNOSIS — M24469 Recurrent dislocation, unspecified knee: Secondary | ICD-10-CM

## 2012-04-15 DIAGNOSIS — M6281 Muscle weakness (generalized): Secondary | ICD-10-CM | POA: Diagnosis not present

## 2012-04-15 DIAGNOSIS — M2390 Unspecified internal derangement of unspecified knee: Secondary | ICD-10-CM

## 2012-04-15 DIAGNOSIS — S83006A Unspecified dislocation of unspecified patella, initial encounter: Secondary | ICD-10-CM

## 2012-04-15 DIAGNOSIS — R269 Unspecified abnormalities of gait and mobility: Secondary | ICD-10-CM | POA: Diagnosis not present

## 2012-04-15 NOTE — Progress Notes (Signed)
Patient ID: Martha Montgomery, female   DOB: [birth year], 31 y.o.   MRN: [id number] Chief Complaint  Patient presents with  . Routine Post Op    post op 2, wound check Left knee, DOS 04/02/12    Left patellofemoral ligament reefing and arthroscopy with chondroplasty left patella  Incision clean. Pain under control. Continue knee immobilizer times one week then come back for J. brace. Continue therapy at home

## 2012-04-15 NOTE — Patient Instructions (Signed)
Brace times one week

## 2012-04-16 DIAGNOSIS — IMO0001 Reserved for inherently not codable concepts without codable children: Secondary | ICD-10-CM | POA: Diagnosis not present

## 2012-04-16 DIAGNOSIS — Z4789 Encounter for other orthopedic aftercare: Secondary | ICD-10-CM | POA: Diagnosis not present

## 2012-04-16 DIAGNOSIS — M6281 Muscle weakness (generalized): Secondary | ICD-10-CM | POA: Diagnosis not present

## 2012-04-16 DIAGNOSIS — R269 Unspecified abnormalities of gait and mobility: Secondary | ICD-10-CM | POA: Diagnosis not present

## 2012-04-17 DIAGNOSIS — R51 Headache: Secondary | ICD-10-CM | POA: Diagnosis not present

## 2012-04-17 DIAGNOSIS — R110 Nausea: Secondary | ICD-10-CM | POA: Diagnosis not present

## 2012-04-19 DIAGNOSIS — IMO0001 Reserved for inherently not codable concepts without codable children: Secondary | ICD-10-CM | POA: Diagnosis not present

## 2012-04-19 DIAGNOSIS — Z4789 Encounter for other orthopedic aftercare: Secondary | ICD-10-CM | POA: Diagnosis not present

## 2012-04-19 DIAGNOSIS — M6281 Muscle weakness (generalized): Secondary | ICD-10-CM | POA: Diagnosis not present

## 2012-04-19 DIAGNOSIS — R269 Unspecified abnormalities of gait and mobility: Secondary | ICD-10-CM | POA: Diagnosis not present

## 2012-04-20 ENCOUNTER — Telehealth: Payer: Self-pay

## 2012-04-20 NOTE — Telephone Encounter (Signed)
States she had surgery on her left knee Sept 6 and in her left foot she now has a bruise and it is puffy looking and red. Advised her to call the surgeon which was Dr Romeo Apple and to call back here if she was unable to reach anyone

## 2012-04-21 ENCOUNTER — Ambulatory Visit (INDEPENDENT_AMBULATORY_CARE_PROVIDER_SITE_OTHER): Payer: Medicare Other | Admitting: Orthopedic Surgery

## 2012-04-21 ENCOUNTER — Encounter: Payer: Self-pay | Admitting: Orthopedic Surgery

## 2012-04-21 VITALS — BP 124/80 | Ht 63.0 in | Wt 120.0 lb

## 2012-04-21 DIAGNOSIS — Z9889 Other specified postprocedural states: Secondary | ICD-10-CM

## 2012-04-21 DIAGNOSIS — R269 Unspecified abnormalities of gait and mobility: Secondary | ICD-10-CM | POA: Diagnosis not present

## 2012-04-21 DIAGNOSIS — M6281 Muscle weakness (generalized): Secondary | ICD-10-CM | POA: Diagnosis not present

## 2012-04-21 DIAGNOSIS — Z4789 Encounter for other orthopedic aftercare: Secondary | ICD-10-CM | POA: Diagnosis not present

## 2012-04-21 DIAGNOSIS — IMO0001 Reserved for inherently not codable concepts without codable children: Secondary | ICD-10-CM | POA: Diagnosis not present

## 2012-04-21 DIAGNOSIS — S83006A Unspecified dislocation of unspecified patella, initial encounter: Secondary | ICD-10-CM

## 2012-04-21 NOTE — Patient Instructions (Signed)
Continue exercises with therapy

## 2012-04-21 NOTE — Progress Notes (Signed)
Patient ID: Martha Montgomery, female   DOB: 10-24-1980, 31 y.o.   MRN: [id number] Chief Complaint  Patient presents with  . Follow-up    1 week recheck on left knee.    C/o ankle swelling   No calf tenderness Negative homans   Temp brace   Return 2 weeks

## 2012-04-22 ENCOUNTER — Other Ambulatory Visit: Payer: Self-pay | Admitting: Orthopedic Surgery

## 2012-04-22 ENCOUNTER — Telehealth: Payer: Self-pay | Admitting: Orthopedic Surgery

## 2012-04-22 ENCOUNTER — Ambulatory Visit: Payer: Medicare Other | Admitting: Orthopedic Surgery

## 2012-04-22 DIAGNOSIS — S83003A Unspecified subluxation of unspecified patella, initial encounter: Secondary | ICD-10-CM

## 2012-04-22 DIAGNOSIS — Z9889 Other specified postprocedural states: Secondary | ICD-10-CM

## 2012-04-22 NOTE — Telephone Encounter (Signed)
OK 

## 2012-04-22 NOTE — Telephone Encounter (Signed)
Advised Jennifer/[facility],  therapy order written,just need to find out where patient wants to have her therapy

## 2012-04-22 NOTE — Telephone Encounter (Signed)
[facility] said Martha Montgomery told her that when she saw you yesterday, you told her it is OK to drive.  If she can drive you will need to order outpatient therapy

## 2012-04-23 DIAGNOSIS — Z4789 Encounter for other orthopedic aftercare: Secondary | ICD-10-CM | POA: Diagnosis not present

## 2012-04-23 DIAGNOSIS — R269 Unspecified abnormalities of gait and mobility: Secondary | ICD-10-CM | POA: Diagnosis not present

## 2012-04-23 DIAGNOSIS — M6281 Muscle weakness (generalized): Secondary | ICD-10-CM | POA: Diagnosis not present

## 2012-04-23 DIAGNOSIS — IMO0001 Reserved for inherently not codable concepts without codable children: Secondary | ICD-10-CM | POA: Diagnosis not present

## 2012-04-29 ENCOUNTER — Telehealth: Payer: Self-pay | Admitting: Family Medicine

## 2012-04-29 NOTE — Telephone Encounter (Signed)
Spoke with patient and she is due to return on October 14th

## 2012-05-05 ENCOUNTER — Ambulatory Visit: Payer: Medicare Other | Admitting: Orthopedic Surgery

## 2012-05-05 ENCOUNTER — Ambulatory Visit (INDEPENDENT_AMBULATORY_CARE_PROVIDER_SITE_OTHER): Payer: Medicare Other | Admitting: Orthopedic Surgery

## 2012-05-05 ENCOUNTER — Ambulatory Visit (HOSPITAL_COMMUNITY): Payer: Medicare Other | Admitting: Physical Therapy

## 2012-05-05 ENCOUNTER — Encounter: Payer: Self-pay | Admitting: Orthopedic Surgery

## 2012-05-05 VITALS — BP 120/84 | Ht 63.0 in | Wt 120.0 lb

## 2012-05-05 DIAGNOSIS — M25569 Pain in unspecified knee: Secondary | ICD-10-CM

## 2012-05-05 MED ORDER — TRAMADOL-ACETAMINOPHEN 37.5-325 MG PO TABS: 1.0000 | ORAL_TABLET | ORAL | Status: DC | PRN

## 2012-05-05 NOTE — Patient Instructions (Addendum)
Therapy

## 2012-05-05 NOTE — Progress Notes (Signed)
Patient ID: Martha Montgomery, female   DOB: [birth year], 31 y.o.   MRN: [id number] Chief Complaint  Patient presents with  . Follow-up    2 week recheck on left knee. 9..6.2013    Doing well waiting for patellar stabilizing brace.  Swelling is gone now. Full range of motion has been restored. She has full extension and good extension, power  Return 4 weeks continue therapy. outpatient

## 2012-05-06 ENCOUNTER — Ambulatory Visit (HOSPITAL_COMMUNITY): Payer: Medicare Other | Admitting: Physical Therapy

## 2012-05-10 ENCOUNTER — Ambulatory Visit (INDEPENDENT_AMBULATORY_CARE_PROVIDER_SITE_OTHER): Payer: Medicare Other

## 2012-05-10 VITALS — BP 112/70 | Wt 131.0 lb

## 2012-05-10 DIAGNOSIS — Z79899 Other long term (current) drug therapy: Secondary | ICD-10-CM | POA: Diagnosis not present

## 2012-05-10 DIAGNOSIS — G479 Sleep disorder, unspecified: Secondary | ICD-10-CM | POA: Diagnosis not present

## 2012-05-10 DIAGNOSIS — Z309 Encounter for contraceptive management, unspecified: Secondary | ICD-10-CM

## 2012-05-10 DIAGNOSIS — G43719 Chronic migraine without aura, intractable, without status migrainosus: Secondary | ICD-10-CM | POA: Diagnosis not present

## 2012-05-10 MED ORDER — MEDROXYPROGESTERONE ACETATE 150 MG/ML IM SUSP
150.0000 mg | Freq: Once | INTRAMUSCULAR | Status: AC
  Administered 2012-05-10: 150 mg via INTRAMUSCULAR

## 2012-05-10 NOTE — Progress Notes (Signed)
Patient in for contraception management.  Depo-provera injection 150mg  given IM in left deltoid.  No voiced complaints.  No sign or symptom of adverse reaction.  Patient aware that she should return on 08-02-2012 for next injection.

## 2012-06-02 ENCOUNTER — Encounter: Payer: Self-pay | Admitting: Orthopedic Surgery

## 2012-06-02 ENCOUNTER — Ambulatory Visit (INDEPENDENT_AMBULATORY_CARE_PROVIDER_SITE_OTHER): Payer: Medicare Other | Admitting: Orthopedic Surgery

## 2012-06-02 VITALS — Ht 63.0 in | Wt 120.0 lb

## 2012-06-02 DIAGNOSIS — S83006A Unspecified dislocation of unspecified patella, initial encounter: Secondary | ICD-10-CM

## 2012-06-02 DIAGNOSIS — Z9889 Other specified postprocedural states: Secondary | ICD-10-CM

## 2012-06-02 DIAGNOSIS — M25569 Pain in unspecified knee: Secondary | ICD-10-CM | POA: Insufficient documentation

## 2012-06-02 MED ORDER — TRAMADOL-ACETAMINOPHEN 37.5-325 MG PO TABS: 1.0000 | ORAL_TABLET | ORAL | Status: DC | PRN

## 2012-06-02 NOTE — Patient Instructions (Addendum)
Continue therapy at [facility] 

## 2012-06-02 NOTE — Progress Notes (Signed)
Patient ID: Martha Montgomery, female   DOB: Jun 28, 1981, 31 y.o.   MRN: [id number] Chief Complaint  Patient presents with  . Follow-up    4 week recheck on left knee.   Current Outpatient Prescriptions on File Prior to Visit  Medication Sig Dispense Refill  . buPROPion (WELLBUTRIN XL) 300 MG 24 hr tablet Take 300 mg by mouth daily.      . medroxyPROGESTERone (DEPO-PROVERA) 150 MG/ML injection Inject 150 mg into the muscle every 3 (three) months.      . metoprolol (LOPRESSOR) 50 MG tablet Take 50 mg by mouth daily.      . metoprolol succinate (TOPROL-XL) 50 MG 24 hr tablet       . omeprazole (PRILOSEC) 20 MG capsule Take 20 mg by mouth daily as needed. Acid Reflux      . oxymetazoline (NASAL SPRAY 12 HOUR) 0.05 % nasal spray Place 2 sprays into the nose daily as needed. Congestion      . promethazine (PHENERGAN) 12.5 MG tablet Take 12.5 mg by mouth every 6 (six) hours as needed.      . topiramate (TOPAMAX) 100 MG tablet Take 100 mg by mouth daily.      . traMADol (ULTRAM) 50 MG tablet Take 50 mg by mouth daily as needed. Pain      . traMADol-acetaminophen (ULTRACET) 37.5-325 MG per tablet Take 1 tablet by mouth every 4 (four) hours as needed for pain.  60 tablet  2  . VOLTAREN 1 % GEL        1. S/P left knee arthroscopy  Ambulatory referral to Physical Therapy  2. Knee pain  traMADol-acetaminophen (ULTRACET) 37.5-325 MG per tablet, Ambulatory referral to Physical Therapy  3. Patellar dislocation  Ambulatory referral to Physical Therapy   She's doing well slight pain slight aching with weather change  Ultracet for pain  Knee stable has not had therapy secondary to difficulty scheduling with physical therapy  Recommend start physical therapy at the hospital  Followup one month continue bracing

## 2012-06-08 ENCOUNTER — Ambulatory Visit (HOSPITAL_COMMUNITY): Payer: Medicare Other | Admitting: Physical Therapy

## 2012-06-09 ENCOUNTER — Telehealth: Payer: Self-pay | Admitting: Orthopedic Surgery

## 2012-06-09 NOTE — Telephone Encounter (Signed)
Called patient and relayed per Dr. Romeo Apple.

## 2012-06-09 NOTE — Telephone Encounter (Signed)
Yes we wrote it  Please call azziza and tell her not to fill Dr Harriett Sine and my prescritptions at the sam time

## 2012-06-09 NOTE — Telephone Encounter (Signed)
Received call from pharmacist, Noralee Space, [place]; said verification needed for Ultracet, prescription dated 06/02/12, as she relates that patient received Tramadol, prescribed by  Dr. Gerilyn Pilgrim on 05/31/12.  Please advise.  Pharmacy ph# is [phone].

## 2012-06-10 DIAGNOSIS — K219 Gastro-esophageal reflux disease without esophagitis: Secondary | ICD-10-CM | POA: Diagnosis not present

## 2012-06-10 DIAGNOSIS — G43719 Chronic migraine without aura, intractable, without status migrainosus: Secondary | ICD-10-CM | POA: Diagnosis not present

## 2012-06-10 DIAGNOSIS — M25569 Pain in unspecified knee: Secondary | ICD-10-CM | POA: Diagnosis not present

## 2012-06-10 DIAGNOSIS — Z79899 Other long term (current) drug therapy: Secondary | ICD-10-CM | POA: Diagnosis not present

## 2012-06-22 ENCOUNTER — Telehealth: Payer: Self-pay | Admitting: Orthopedic Surgery

## 2012-06-22 NOTE — Telephone Encounter (Signed)
No   Sorry we are using that to avoid narcotic

## 2012-06-22 NOTE — Telephone Encounter (Signed)
Richetta called this afternoon asking if you can prescribe another medication other than Ultracet, that will be just as effective.  She said there has been a huge mix-up at the pharmacies about the Ultracet prescriptions    (no fault of either our office or Dr. Ronal Fear),  And she just wants something other than Ultracet.  She said she now uses [facility] in [place], phone # [phone]

## 2012-06-22 NOTE — Telephone Encounter (Signed)
Patient advised of doctor's reply °

## 2012-06-23 ENCOUNTER — Telehealth: Payer: Self-pay | Admitting: Family Medicine

## 2012-06-23 MED ORDER — METOPROLOL TARTRATE 50 MG PO TABS: 50.0000 mg | ORAL_TABLET | Freq: Every day | ORAL | Status: DC

## 2012-06-23 NOTE — Telephone Encounter (Signed)
Med sent in.

## 2012-06-24 ENCOUNTER — Telehealth: Payer: Self-pay | Admitting: Family Medicine

## 2012-06-24 NOTE — Telephone Encounter (Signed)
Pt called her blood pressure has been elevated with headache 150/100 has history of migraines. She has been out of toprol for the past 2 days, some mix up with insurance company but able to get it, asked for home remeidies until she could get her medicine today. No N/V, CP,SOB She also was using ultram for migraine given by neurology but she was flagged in pharmacy system after trying to get it filled at two different places because she received script for [facility] and her orthopedic surgeon, now she is not able to get either??/  Advised we will discuss that in office next week  Okay to stay at home for BP until pharmacy opens in 2 hours Drink water, avoid salt Tylenol prn HA

## 2012-06-28 ENCOUNTER — Ambulatory Visit: Payer: Medicare Other | Admitting: Family Medicine

## 2012-06-30 ENCOUNTER — Ambulatory Visit: Payer: Medicare Other | Admitting: Family Medicine

## 2012-06-30 ENCOUNTER — Encounter: Payer: Self-pay | Admitting: Family Medicine

## 2012-06-30 ENCOUNTER — Ambulatory Visit (INDEPENDENT_AMBULATORY_CARE_PROVIDER_SITE_OTHER): Payer: Medicare Other | Admitting: Family Medicine

## 2012-06-30 VITALS — BP 150/100 | HR 103 | Resp 16 | Ht 63.0 in | Wt 132.4 lb

## 2012-06-30 DIAGNOSIS — I10 Essential (primary) hypertension: Secondary | ICD-10-CM | POA: Insufficient documentation

## 2012-06-30 DIAGNOSIS — J209 Acute bronchitis, unspecified: Secondary | ICD-10-CM | POA: Diagnosis not present

## 2012-06-30 DIAGNOSIS — F329 Major depressive disorder, single episode, unspecified: Secondary | ICD-10-CM

## 2012-06-30 DIAGNOSIS — K219 Gastro-esophageal reflux disease without esophagitis: Secondary | ICD-10-CM

## 2012-06-30 MED ORDER — BENZONATATE 100 MG PO CAPS: 100.0000 mg | ORAL_CAPSULE | Freq: Three times a day (TID) | ORAL | Status: DC | PRN

## 2012-06-30 MED ORDER — HYDROCHLOROTHIAZIDE 12.5 MG PO CAPS: 12.5000 mg | ORAL_CAPSULE | Freq: Every day | ORAL | Status: DC

## 2012-06-30 MED ORDER — POTASSIUM CHLORIDE CRYS ER 20 MEQ PO TBCR: EXTENDED_RELEASE_TABLET | ORAL | Status: DC

## 2012-06-30 MED ORDER — METOPROLOL TARTRATE 50 MG PO TABS: 50.0000 mg | ORAL_TABLET | Freq: Two times a day (BID) | ORAL | Status: DC

## 2012-06-30 MED ORDER — FLUCONAZOLE 150 MG PO TABS: ORAL_TABLET | ORAL | Status: DC

## 2012-06-30 MED ORDER — SULFAMETHOXAZOLE-TRIMETHOPRIM 800-160 MG PO TABS: 1.0000 | ORAL_TABLET | Freq: Two times a day (BID) | ORAL | Status: AC

## 2012-06-30 NOTE — Progress Notes (Signed)
  Subjective:    Patient ID: Martha Montgomery, female    DOB: 1981/07/10, 31 y.o.   MRN: [id number]  HPI Pt states she originally called, last week, due to"mix up in pain med" Martha Montgomery operated on left knee o 04/02/2012, he prescribed dilaudid, she states she only took a few since too strong for her. States she was taking it for about 1 week only , she never finished the tablets . States she had inpt physical therapy and had weekly f/u with Martha Montgomery, up until [place]. states , Martha, Romeo Montgomery started prescribing ultracet approx 1 week after the operation States she did not realize it would be a problem even though Martha Montgomery was prescribing tramadol States she has been filling [facility], and all the other meds at [facility], states in October she transferred "all other scripts " [facility]  [facility], topamax, metoprolol, fioricet, and "all her meds" States that when transfer occurred, ultracet was transferred from Martha Montgomery office it was called in as tramadol instead of ultracet  states she was "treated badly" by [facility]ist, and was mad e to feel as though she was trying have 2 Docs  Prescribe the same pain med . Martha Montgomery has been prescribing medication for her for years for headache management  States her main concern is about her character in her files, no trust by providers since this occured States her attorney, advised her to file a complaint, initial reason was about [facility], and states she was advised  To involve her treating docs.though she does not want to as she states they have cared well for her. States the problem has been going on since October. She does also report increased family stress since her sibling has moved in  to live with her spouse and son. Initially spouse was supportive but in recent times discontent has set in Recently was made aware that her blood pressure was elevated, her head was hurting and when she checked the BP this was high, wants this  addressed 2 day h/o cough and chest congestion with green sputum, intermittent chills , no documented fever   Review of Systems See HPI Denies recent fever or chills. Denies sinus pressure, nasal congestion, ear pain or sore throat.  Denies chest pains, palpitations and leg swelling Denies abdominal pain, nausea, vomiting,diarrhea or constipation.   Denies dysuria, frequency, hesitancy or incontinence.  Denies  seizures, numbness, or tingling. Increased  depression, anxiety or insomnia.feels as though her character is in question and she finds this particularly stressful. Not suicidal or homicidal, but excessive crying, hurt and anger primarily toward the pharmacist  Denies skin break down or rash.        Objective:   Physical Exam  Patient alert and oriented and in no cardiopulmonary distress.Ill appearing, tearful  HEENT: No facial asymmetry, EOMI, no sinus tenderness,  oropharynx pink and moist.  Neck supple no adenopathy.  Chest: adequate air entry scattered crackles, few wheezes  CVS: S1, S2 no murmurs, no S3.  ABD: Soft non tender. Bowel sounds normal.  Ext: No edema  MS: Adequate ROM spine, shoulders, hips and knees.  Skin: Intact, no ulcerations or rash noted.  Psych: Good eye contact,  Memory intact   depressed appearing.  CNS: CN 2-12 intact, power, tone and sensation normal throughout.       Assessment & Plan:

## 2012-06-30 NOTE — Patient Instructions (Addendum)
F/u in 3.5 to 4 weeks, call if you need me before  You a re being treated for acute bronchitis, medications are sent to your pharmacy. Please schedule an appointment with [facility] since you are stressed.  I will discuss concerns re your pain medication as we discussed  Blood pressure is high, increase metoprolol to twice daily, and also start hCTZ and potassium

## 2012-07-01 ENCOUNTER — Encounter: Payer: Self-pay | Admitting: Orthopedic Surgery

## 2012-07-01 ENCOUNTER — Ambulatory Visit: Payer: Medicare Other | Admitting: Orthopedic Surgery

## 2012-07-07 ENCOUNTER — Ambulatory Visit (INDEPENDENT_AMBULATORY_CARE_PROVIDER_SITE_OTHER): Payer: Medicare Other | Admitting: Orthopedic Surgery

## 2012-07-07 ENCOUNTER — Encounter: Payer: Self-pay | Admitting: Orthopedic Surgery

## 2012-07-07 VITALS — BP 118/76 | Ht 63.0 in | Wt 120.0 lb

## 2012-07-07 DIAGNOSIS — M24469 Recurrent dislocation, unspecified knee: Secondary | ICD-10-CM

## 2012-07-07 NOTE — Patient Instructions (Addendum)
Wear brace full time except for sleeping and bathing;  call us when you can do therapy

## 2012-07-07 NOTE — Progress Notes (Signed)
Patient ID: Martha Montgomery, female   DOB: Jun 19, 1981, 31 y.o.   MRN: [id number] Chief Complaint  Patient presents with  . Follow-up    4 week follow up  left knee, 04/02/2012    Patient patellar medial ligament reefing. Did not go to therapy. Several reasons why she didn't go to therapy once she didn't get a phone call she says from the therapist. Now her son has been sick.  She's also had some unexplained difficulties getting her pain medications I do not think that she is pain medication seeking for tramadol. I think is just a mixup between the pharmacist and her.  In terms of her left knee she has some pain with flexion after 90.  Her passive range of motion is 130. She has weakness of her quadricep muscle. Her incision is healed is no tenderness around it no swelling in the knee joint her knee tracks normal in terms of patellofemoral joint. She is a good stable medial patellofemoral ligament  Neurovascular exam is intact  She's walking with a brace on her leg and no pain or crutches  Followup by phone call when she can actually go to therapy and all see her after she's had another 6-8 weeks of therapy.

## 2012-07-18 NOTE — Assessment & Plan Note (Signed)
Antibiotics and decongestants prescribed 

## 2012-07-18 NOTE — Assessment & Plan Note (Signed)
unontrolled symptoms, may need to be re evaluated by GI. avoidance as well as med compliance discussed. Her current increased emotional stress is also a factor in increased flare currently

## 2012-07-18 NOTE — Assessment & Plan Note (Signed)
Uncontrolled , dose increase in medication DASH diet and commitment to daily physical activity for a minimum of 30 minutes discussed and encouraged, as a part of hypertension management. The importance of attaining a healthy weight is also discussed.  

## 2012-07-18 NOTE — Assessment & Plan Note (Signed)
Uncontrolled , though not suicidal or homicidal . Increased personal and family stress. Will re establish with psychiatry

## 2012-08-02 ENCOUNTER — Ambulatory Visit: Payer: Medicare Other

## 2012-08-02 ENCOUNTER — Ambulatory Visit: Payer: Medicare Other | Admitting: Family Medicine

## 2012-08-04 ENCOUNTER — Ambulatory Visit (INDEPENDENT_AMBULATORY_CARE_PROVIDER_SITE_OTHER): Payer: Medicare Other | Admitting: Family Medicine

## 2012-08-04 ENCOUNTER — Ambulatory Visit: Payer: Medicare Other | Admitting: Family Medicine

## 2012-08-04 ENCOUNTER — Encounter: Payer: Self-pay | Admitting: Family Medicine

## 2012-08-04 VITALS — BP 124/82 | HR 92 | Resp 16 | Ht 63.0 in | Wt 136.0 lb

## 2012-08-04 DIAGNOSIS — G43109 Migraine with aura, not intractable, without status migrainosus: Secondary | ICD-10-CM

## 2012-08-04 DIAGNOSIS — F17200 Nicotine dependence, unspecified, uncomplicated: Secondary | ICD-10-CM | POA: Diagnosis not present

## 2012-08-04 DIAGNOSIS — R5381 Other malaise: Secondary | ICD-10-CM

## 2012-08-04 DIAGNOSIS — R51 Headache: Secondary | ICD-10-CM | POA: Diagnosis not present

## 2012-08-04 DIAGNOSIS — R5383 Other fatigue: Secondary | ICD-10-CM | POA: Diagnosis not present

## 2012-08-04 DIAGNOSIS — I10 Essential (primary) hypertension: Secondary | ICD-10-CM

## 2012-08-04 DIAGNOSIS — Z79899 Other long term (current) drug therapy: Secondary | ICD-10-CM | POA: Diagnosis not present

## 2012-08-04 DIAGNOSIS — Z309 Encounter for contraceptive management, unspecified: Secondary | ICD-10-CM | POA: Diagnosis not present

## 2012-08-04 DIAGNOSIS — F329 Major depressive disorder, single episode, unspecified: Secondary | ICD-10-CM

## 2012-08-04 MED ORDER — MEDROXYPROGESTERONE ACETATE 150 MG/ML IM SUSP
150.0000 mg | Freq: Once | INTRAMUSCULAR | Status: AC
  Administered 2012-08-04: 150 mg via INTRAMUSCULAR

## 2012-08-04 MED ORDER — KETOROLAC TROMETHAMINE 60 MG/2ML IJ SOLN
60.0000 mg | Freq: Once | INTRAMUSCULAR | Status: AC
  Administered 2012-08-04: 60 mg via INTRAMUSCULAR

## 2012-08-04 NOTE — Assessment & Plan Note (Addendum)
Needs to re establish with psychiatry, remains extremely anxious and depressed. Not suicidal or homicidal

## 2012-08-04 NOTE — Progress Notes (Signed)
  Subjective:    Patient ID: Martha Montgomery, female    DOB: 11/20/1980, 32 y.o.   MRN: [id number]  HPI Pt in for re evaluation of uncontrolled  hypertension and acute bronchitis as well as chronic illness. She denies adverse reaction to meds and her bronchitis is cleared C/o increased and uncontrolled stress and anxiety due to behavioral problems with her 64 year old son Still needs to make an apt with psych not suicidal or homicidal, has been over eatingTrying to quit using the electric cigarette, no quit date set C/o frontal pounding headache x 1 day, no blurred vision, weakness or numbness    Review of Systems See HPI Denies recent fever or chills. Denies sinus pressure, nasal congestion, ear pain or sore throat. Denies chest congestion, productive cough or wheezing. Denies chest pains, palpitations and leg swelling Denies abdominal pain, nausea, vomiting,diarrhea or constipation.   Denies dysuria, frequency, hesitancy or incontinence.  Denies skin break down or rash.        Objective:   Physical Exam Patient alert and oriented and in no cardiopulmonary distress.  HEENT: No facial asymmetry, EOMI, no sinus tenderness,  oropharynx pink and moist.  Neck supple no adenopathy.  Chest: Clear to auscultation bilaterally.  CVS: S1, S2 no murmurs, no S3.  ABD: Soft non tender. Bowel sounds normal.  Ext: No edema  MS: Adequate ROM spine, shoulders, hips and knees.  Skin: Intact, no ulcerations or rash noted.  Psych: Good eye contact, normal affect. Memory intact  anxious and  depressed appearing.  CNS: CN 2-12 intact, power, tone and sensation normal throughout.        Assessment & Plan:

## 2012-08-04 NOTE — Assessment & Plan Note (Signed)
Uncontrolled, no focal deficits , toradol administered at visit

## 2012-08-04 NOTE — Assessment & Plan Note (Signed)
Controlled, no change in medication DASH diet and commitment to daily physical activity for a minimum of 30 minutes discussed and encouraged, as a part of hypertension management. The importance of attaining a healthy weight is also discussed.  

## 2012-08-04 NOTE — Patient Instructions (Addendum)
Pelvic and breast in 12 weeks, also depo provera  Fasting lipid, chem 7, TSh , and CBC in 12 weeks before visit.  Blood pressure is excellent, continue current meds  Please set a quit date by the time you get back to stop all nicotine including the electronic cigarette , you need to quit. Nicotine increases the risk of all forms of cancer, stroke and heart disease. Congrats on the progress you are making in this regard.  Please schedule and start counseling for yourself, you will benefit.  It is important that you exercise regularly at least 30 minutes 5 times a week. If you develop chest pain, have severe difficulty breathing, or feel very tired, stop exercising immediately and seek medical attention   A healthy diet is rich in fruit, vegetables and whole grains. Poultry fish, nuts and beans are a healthy choice for protein rather then red meat. A low sodium diet and drinking 64 ounces of water daily is generally recommended. Oils and sweet should be limited. Carbohydrates especially for those who are diabetic or overweight, should be limited to 30-45 gram per meal. It is important to eat on a regular schedule, at least 3 times daily. Snacks should be primarily fruits, vegetables or nuts.  Toradol 60mg  iM in office today for headache  Depo provera in office today

## 2012-08-04 NOTE — Assessment & Plan Note (Signed)
Using electric cigarette Needs to quit Patient counseled for approximately 5 minutes regarding the health risks of ongoing nicotine use, specifically all types of cancer, heart disease, stroke and respiratory failure. The options available for help with cessation ,the behavioral changes to assist the process, and the option to either gradully reduce usage  Or abruptly stop.is also discussed. Pt is also encouraged to set specific goals in number of cigarettes used daily, as well as to set a quit date.

## 2012-08-04 NOTE — Assessment & Plan Note (Deleted)
Headache today, prt under excessive stress, having behavioral probs with her 32 y/o, still has not started therapy for herself which she admits to rneeding a lot Toradol administered

## 2012-08-07 ENCOUNTER — Telehealth: Payer: Self-pay | Admitting: Family Medicine

## 2012-08-07 NOTE — Telephone Encounter (Signed)
Pt called on 08/06/2012 stating she needed refills prescribed by her neurologist for wellbutrin and ultracet, she had called his office earlier with no success. I explained she should contact him for the necessary refills, she has recently had problems with confusion as far as duplication of the ultram/ultracet between her neurologist and orthopedic office.I stated I would not refill the ultracet, but if she was unable to contact the neurologist re the welbutrin I would prescribe 1 month supply.Advised her to call back the next day if unable.  I called and had to leave a voice message later the evening of January 10 to see if she was successful in obtaining the refill , and agaain on the morning of 08/07/2012. Both times messages were left , no response/call back made to date and time of note entry

## 2012-08-10 DIAGNOSIS — R0609 Other forms of dyspnea: Secondary | ICD-10-CM | POA: Diagnosis not present

## 2012-08-10 DIAGNOSIS — Z79899 Other long term (current) drug therapy: Secondary | ICD-10-CM | POA: Diagnosis not present

## 2012-08-10 DIAGNOSIS — G43719 Chronic migraine without aura, intractable, without status migrainosus: Secondary | ICD-10-CM | POA: Diagnosis not present

## 2012-09-08 DIAGNOSIS — R0989 Other specified symptoms and signs involving the circulatory and respiratory systems: Secondary | ICD-10-CM | POA: Diagnosis not present

## 2012-09-08 DIAGNOSIS — G43719 Chronic migraine without aura, intractable, without status migrainosus: Secondary | ICD-10-CM | POA: Diagnosis not present

## 2012-09-08 DIAGNOSIS — Z79899 Other long term (current) drug therapy: Secondary | ICD-10-CM | POA: Diagnosis not present

## 2012-09-08 DIAGNOSIS — R0609 Other forms of dyspnea: Secondary | ICD-10-CM | POA: Diagnosis not present

## 2012-09-16 ENCOUNTER — Other Ambulatory Visit (HOSPITAL_COMMUNITY): Payer: Self-pay

## 2012-10-14 ENCOUNTER — Other Ambulatory Visit: Payer: Self-pay | Admitting: Orthopedic Surgery

## 2012-10-14 ENCOUNTER — Telehealth: Payer: Self-pay | Admitting: Radiology

## 2012-10-14 DIAGNOSIS — M24469 Recurrent dislocation, unspecified knee: Secondary | ICD-10-CM

## 2012-10-14 NOTE — Telephone Encounter (Signed)
Ive ordered the therepy have her call to set up appt   Fax order to place of therapy

## 2012-10-14 NOTE — Telephone Encounter (Signed)
She is stating she is now able to go to therapy for her left knee that she had surgery on, she has never been to therapy yet for her knee. She lives in [place]. Please advise. Her surgery was 04-02-12.

## 2012-10-14 NOTE — Telephone Encounter (Signed)
Called patient and she stated she wanted to go to [facility]. I advised her to call them and set it up. I faxed the order to [facility].

## 2012-10-27 ENCOUNTER — Ambulatory Visit (HOSPITAL_COMMUNITY)
Admission: RE | Admit: 2012-10-27 | Discharge: 2012-10-27 | Disposition: A | Discharge status: Home / Self Care | Payer: Medicare Other | Source: Ambulatory Visit | Attending: Orthopedic Surgery | Admitting: Orthopedic Surgery

## 2012-10-27 DIAGNOSIS — M6281 Muscle weakness (generalized): Secondary | ICD-10-CM | POA: Diagnosis not present

## 2012-10-27 DIAGNOSIS — M25569 Pain in unspecified knee: Secondary | ICD-10-CM | POA: Diagnosis not present

## 2012-10-27 DIAGNOSIS — M25669 Stiffness of unspecified knee, not elsewhere classified: Secondary | ICD-10-CM | POA: Diagnosis not present

## 2012-10-27 DIAGNOSIS — IMO0001 Reserved for inherently not codable concepts without codable children: Secondary | ICD-10-CM | POA: Insufficient documentation

## 2012-10-27 DIAGNOSIS — Z9889 Other specified postprocedural states: Secondary | ICD-10-CM

## 2012-10-27 DIAGNOSIS — I10 Essential (primary) hypertension: Secondary | ICD-10-CM | POA: Insufficient documentation

## 2012-10-27 DIAGNOSIS — S83006A Unspecified dislocation of unspecified patella, initial encounter: Secondary | ICD-10-CM

## 2012-10-27 NOTE — Evaluation (Signed)
Physical Therapy Evaluation  Patient Details  Name: Martha Montgomery MRN: [id number] Date of Birth: 05-28-1981  Today's Date: 10/27/2012 Time: 4098-1191 PT Time Calculation (min): 45 min Charges: 1 evaluation, 15' TE             Visit#: 1 of 12  Re-eval: 11/26/12 Assessment Diagnosis: Knee pain Surgical Date: 04/02/13 Next MD Visit: Dr. Romeo Apple  Prior Therapy: 3 years ago  Authorization: medicare    Authorization Time Period:    Authorization Visit#: 1 of 10   Past Medical History:  Past Medical History  Diagnosis Date  . Hypertension x 6 years   . Anxiety disorder x6 years   . Depression   . Migraines x6 years   . Thyroid goiter   . Gastritis nov. 2011    EGD by Dr. Darrick Penna, negative h pylori   Past Surgical History:  Past Surgical History  Procedure Laterality Date  . Cesarean section    . Abscesses removed from right buttock / left arm    . Chondroplasty  04/02/2012    Procedure: CHONDROPLASTY;  Surgeon: Vickki Hearing, MD;  Location: [facility];  Service: Orthopedics;  Laterality: Left;  of left patella    Subjective Symptoms/Limitations Pertinent History: Pt is referred to PT for Lt patellar subluxations since 2010 and was referred to Dr. Romeo Apple who performed  left knee chondroplasty of the patella and medial patellofemoral ligament reconstruction to fix lt patellar subluxations on 04/02/13.  She currently continues to have pain and tenderness to both knees, has not had a reoccurence to of patella subluxations. She has greatest pain in the evening after standing or sitting for long peroids of time.  Also has increased pain with the weather.  How long can you sit comfortably?: 30 minutes How long can you stand comfortably?: 30 minutes How long can you walk comfortably?: 20 minutes Patient Stated Goals: Pt wants to be able to strengthen her knee and have greater flexibility and maintain as much usage of her knee.  Pain Assessment Currently in Pain?: Yes Pain Score:    2 Pain Location: Knee (Pain Range: 2-8/10. ) Pain Orientation: Right;Left Pain Type: Acute pain;Chronic pain Pain Relieving Factors: ultraset, heat (no ice) Effect of Pain on Daily Activities: cooking, cleaning, showering and getting into and out of the bath.   Precautions/Restrictions  Precautions Precaution Comments: knee brace when running Restrictions Weight Bearing Restrictions: No  Balance Screening Balance Screen Has the patient fallen in the past 6 months: No Has the patient had a decrease in activity level because of a fear of falling? : Yes (pain) Is the patient reluctant to leave their home because of a fear of falling? 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   Cognition/Observation Observation/Other Assessments Observations: increased atrophy to bilateral quadriceps  Sensation/Coordination/Flexibility/Functional Tests Functional Tests Functional Tests: Lower Extremity Functional Scale (LEFS): 31/80  Assessment RLE AROM (degrees) Right Knee Extension: 0 Right Knee Flexion: 130 RLE Strength Right Hip Flexion: 3+/5 Right Hip Extension: 4/5 Right Hip ABduction: 4/5 Right Hip ADduction: 4/5 Right Knee Flexion: 3+/5 Right Knee Extension: 3+/5 LLE AROM (degrees) Left Knee Extension: 0 Left Knee Flexion: 130 LLE Strength Left Hip Flexion: 3+/5 Left Hip Extension: 4/5 Left Hip ABduction: 4/5 Left Hip ADduction: 4/5 Left Knee Flexion: 3+/5 Left Knee Extension: 3+/5 Palpation  Palpation: significant pain and tenderness to left medial knee and joint space.  tenderness throughout rt knee  Mobility/Balance  Ambulation/Gait Ambulation/Gait: Yes Gait Pattern: Antalgic Static Standing Balance Static Standing - Comment/# of Minutes: impaired hip and ankle straegy   Single Leg Stance - Right Leg: 5 Single Leg Stance - Left Leg: 5 Tandem Stance - Right Leg: 10 Tandem Stance - Left Leg: 10 Rhomberg - Eyes Opened: 10 Rhomberg - Eyes Closed: 10   Exercise/Treatments Standing Heel Raises: 10 reps;Limitations Heel Raises Limitations: Toe Raises 10 reps  Functional Squat: 10 reps Supine Quad Sets: Both;5 reps Short Arc Quad Sets: Both;5 reps;Limitations Short Arc Quad Sets Limitations: 5 sec holds Bridges: 5 reps;Limitations Bridges Limitations: 5 sec holds Straight Leg Raises: Both;5 reps Sidelying Hip ABduction: Both;5 reps Hip ADduction: Both;5 reps Prone  Hip Extension: Both;5 reps  Physical Therapy Assessment and Plan PT Assessment and Plan Clinical Impression Statement: Pt is a 32 year old female referred to PT s/p left knee chondroplasty of the patella and medial patellofemoral ligament reconstruction to fix lt patellar subluxations on 04/02/13 with following impairments listed below.   Pt will benefit from skilled therapeutic intervention in order to improve on the following deficits: Decreased strength;Pain;Improper body mechanics;Difficulty walking PT Frequency: Min 3X/week PT Duration: 6 weeks PT Treatment/Interventions: Gait training;Stair training;Functional mobility training;Therapeutic activities;Therapeutic exercise;Balance training PT Plan: start with open chain activities including 4 way SLR, bridging,     Goals Home Exercise Program Pt will Perform Home Exercise Program: Independently PT Goal: Perform Home Exercise Program - Progress: Goal set today PT Short Term Goals Time to Complete Short Term Goals: 3 weeks PT Short Term Goal 1: Pt will improve her functional knee strength by 1 muscle grade in order to progress towards walking without knee braces.  PT Short Term Goal 2: Pt will report pain less than 3/10 for 75% of her day.  PT Short Term Goal 3: Pt will improve her knee stability in order to decrease risk of patellar  dislocations.  PT Long Term Goals Time to Complete Long Term Goals:  (6 weeks) PT Long Term Goal 1: Pt will improve her BLE strength in order to squat with proper mechanics to decrease risk of secondary injury.  PT Long Term Goal 2: Pt will improve her LEFS to 60/80 for improved percieved functional abilty.   Problem List Patient Active Problem List  Diagnosis  . DERMATOMYCOSIS  . DEPRESSION, SEVERE  . MIGRAINE WITH AURA  . ALLERGIC RHINITIS CAUSE UNSPECIFIED  . CONSTIPATION  . IBS  . SUBLUXATION PATELLAR (MALALIGNMENT)  . KNEE PAIN, BILATERAL  . FATIGUE  . HEADACHE  . ADVERSE DRUG REACTION  . GASTRITIS  . BACK PAIN  . ABDOMINAL PAIN  . Rectal bleed  . Unintentional weight loss  . IBS (irritable bowel syndrome)  . GERD (gastroesophageal reflux disease)  . Patellar malalignment syndrome  . S/P left knee arthroscopy  . Patellar dislocation  . Knee pain  . HTN (hypertension), benign  . Nicotine dependence  . Stiffness of joint, not elsewhere classified, lower leg    PT Plan of Care PT Home Exercise Plan: see scanned report PT Patient Instructions: importance of HEP, answered questions on diagnosis and POC Consulted and Agree with Plan of Care: Patient  GP Functional Assessment Tool Used: LEFS: 31/80 Functional Limitation: Mobility: Walking and moving around Mobility: Walking and Moving Around Current Status ([id number]): At least 60 percent but less than 80 percent impaired, limited or restricted Mobility: Walking and  Moving Around Goal Status [phone]): At least 1 percent but less than 20 percent impaired, limited or restricted  Nazifa Trinka, PT 10/27/2012, 5:21 PM  Physician Documentation Your signature is required to indicate approval of the treatment plan as stated above.  Please sign and either send electronically or make a copy of this report for your files and return this physician signed original.   Please mark one 1.__approve of plan  2. ___approve of plan with the  following conditions.   ______________________________                                                          _____________________ Physician Signature                                                                                                             Date

## 2012-11-02 ENCOUNTER — Ambulatory Visit (HOSPITAL_COMMUNITY)
Admission: RE | Admit: 2012-11-02 | Discharge: 2012-11-02 | Disposition: A | Discharge status: Home / Self Care | Payer: Medicare Other | Source: Ambulatory Visit | Attending: Orthopedic Surgery | Admitting: Orthopedic Surgery

## 2012-11-02 ENCOUNTER — Encounter: Payer: Medicare Other | Admitting: Family Medicine

## 2012-11-02 DIAGNOSIS — IMO0001 Reserved for inherently not codable concepts without codable children: Secondary | ICD-10-CM | POA: Diagnosis not present

## 2012-11-02 DIAGNOSIS — M6281 Muscle weakness (generalized): Secondary | ICD-10-CM | POA: Diagnosis not present

## 2012-11-02 DIAGNOSIS — M25669 Stiffness of unspecified knee, not elsewhere classified: Secondary | ICD-10-CM | POA: Diagnosis not present

## 2012-11-02 DIAGNOSIS — I10 Essential (primary) hypertension: Secondary | ICD-10-CM | POA: Diagnosis not present

## 2012-11-02 DIAGNOSIS — M25569 Pain in unspecified knee: Secondary | ICD-10-CM | POA: Diagnosis not present

## 2012-11-02 NOTE — Progress Notes (Signed)
Physical Therapy Treatment Patient Details  Name: SHAIRA SOVA MRN: [id number] Date of Birth: 1981-06-07  Today's Date: 11/02/2012 Time: 1520-1555 PT Time Calculation (min): 35 min Charge: therex 35'   Visit#: 2 of 12  Re-eval: 11/26/12    Authorization: medicare  Authorization Time Period:    Authorization Visit#: 2 of 10   Subjective: Symptoms/Limitations Symptoms: Pt reported compliance with HEP, stated no pain reported pain medication taken 4 hours ago Pain Assessment Currently in Pain?: No/denies  Objective:   Exercise/Treatments Standing Heel Raises: 10 reps;Limitations Heel Raises Limitations: Toe Raises 10 reps  Functional Squat: 10 reps Supine Quad Sets: 10 reps;Both Short Arc [employer]: 10 reps;Both Short Arc [employer] Limitations: 5 second holds Terminal Knee Extension: Both;10 reps Bridges: 10 reps;Limitations Bridges Limitations: 5 sec holds Straight Leg Raises: Both;10 reps Sidelying Hip ABduction: Both;10 reps Hip ADduction: Both;10 reps Prone  Hamstring Curl: 10 reps;Limitations Hamstring Curl Limitations: Bil LE  Hip Extension: Both;10 reps      Physical Therapy Assessment and Plan PT Assessment and Plan Clinical Impression Statement: Began POC per PT for LE strengthening, pt able to demonstrate exercises correctly with min cueing required for proper form with functional squats and SAQ/TKE.  Noted visible musculature fatigue with activities.  Pt reported no real increase in pain with activities. PT Plan: Continue with current POC for functional strengthening.     Goals    Problem List Patient Active Problem List  Diagnosis  . DERMATOMYCOSIS  . DEPRESSION, SEVERE  . MIGRAINE WITH AURA  . ALLERGIC RHINITIS CAUSE UNSPECIFIED  . CONSTIPATION  . IBS  . SUBLUXATION PATELLAR (MALALIGNMENT)  . KNEE PAIN, BILATERAL  . FATIGUE  . HEADACHE  . ADVERSE DRUG REACTION  . GASTRITIS  . BACK PAIN  . ABDOMINAL PAIN  . Rectal bleed  .  Unintentional weight loss  . IBS (irritable bowel syndrome)  . GERD (gastroesophageal reflux disease)  . Patellar malalignment syndrome  . S/P left knee arthroscopy  . Patellar dislocation  . Knee pain  . HTN (hypertension), benign  . Nicotine dependence  . Stiffness of joint, not elsewhere classified, lower leg    PT - End of Session Activity Tolerance: Patient tolerated treatment well General Behavior During Session: [facility] for tasks performed Cognition: [facility] for tasks performed  GP    Juel Burrow 11/02/2012, 3:58 PM

## 2012-11-03 ENCOUNTER — Ambulatory Visit (INDEPENDENT_AMBULATORY_CARE_PROVIDER_SITE_OTHER): Payer: Medicare Other

## 2012-11-03 ENCOUNTER — Ambulatory Visit (HOSPITAL_COMMUNITY): Payer: Medicare Other | Admitting: *Deleted

## 2012-11-03 VITALS — BP 122/70 | Wt 141.0 lb

## 2012-11-03 DIAGNOSIS — Z309 Encounter for contraceptive management, unspecified: Secondary | ICD-10-CM | POA: Diagnosis not present

## 2012-11-04 DIAGNOSIS — Z309 Encounter for contraceptive management, unspecified: Secondary | ICD-10-CM | POA: Diagnosis not present

## 2012-11-04 MED ORDER — MEDROXYPROGESTERONE ACETATE 150 MG/ML IM SUSP
150.0000 mg | Freq: Once | INTRAMUSCULAR | Status: AC
  Administered 2012-11-04: 150 mg via INTRAMUSCULAR

## 2012-11-04 NOTE — Progress Notes (Signed)
Patient in for contraception management.  Given depo provera IM in right upper quadrant of gluteal.  No sign or symptom of adverse reaction.  Next injection due 01/26/2013

## 2012-11-05 ENCOUNTER — Ambulatory Visit (HOSPITAL_COMMUNITY)
Admission: RE | Admit: 2012-11-05 | Discharge: 2012-11-05 | Disposition: A | Discharge status: Home / Self Care | Payer: Medicare Other | Source: Ambulatory Visit | Attending: Orthopedic Surgery | Admitting: Orthopedic Surgery

## 2012-11-05 DIAGNOSIS — M25569 Pain in unspecified knee: Secondary | ICD-10-CM | POA: Diagnosis not present

## 2012-11-05 DIAGNOSIS — I10 Essential (primary) hypertension: Secondary | ICD-10-CM | POA: Diagnosis not present

## 2012-11-05 DIAGNOSIS — M25669 Stiffness of unspecified knee, not elsewhere classified: Secondary | ICD-10-CM | POA: Diagnosis not present

## 2012-11-05 DIAGNOSIS — IMO0001 Reserved for inherently not codable concepts without codable children: Secondary | ICD-10-CM | POA: Diagnosis not present

## 2012-11-05 DIAGNOSIS — M6281 Muscle weakness (generalized): Secondary | ICD-10-CM | POA: Diagnosis not present

## 2012-11-05 NOTE — Progress Notes (Signed)
Physical Therapy Treatment Patient Details  Name: ALEJA YEARWOOD MRN: [id number] Date of Birth: 04/27/1981  Today's Date: 11/05/2012 Time: 1191-4782 PT Time Calculation (min): 43 min Charge: therex 56'  Visit#: 3 of 12  Re-eval: 11/26/12    Authorization: medicare  Authorization Time Period:    Authorization Visit#: 3 of 10   Subjective: Symptoms/Limitations Symptoms: Pt reported she was a little sore following last session, no report of pain today. Pain Assessment Currently in Pain?: No/denies  Objective:   Exercise/Treatments Aerobic Stationary Bike: NuStep 10 minutes resistance L2 Standing Heel Raises: 15 reps;Limitations Heel Raises Limitations: Toe Raises 15 reps  Terminal Knee Extension: 10 reps;Theraband Theraband Level (Terminal Knee Extension): Level 4 (Blue) Functional Squat: 15 reps Rocker Board: 2 minutes;Limitations Rocker Board Limitations: R/L  Supine Quad Sets: 15 reps;Limitations Quad Sets Limitations: 5" holds Short Arc [employer] Sets: 15 reps;Limitations Short Arc Quad Sets Limitations: 5 second holds Terminal Knee Extension: Both;15 reps Straight Leg Raises: Both;15 reps Sidelying Hip ABduction: Both;15 reps Hip ADduction: Both;15 reps Prone  Hamstring Curl: 15 reps;Limitations Hamstring Curl Limitations: Bil LE  Hip Extension: Both;15 reps      Physical Therapy Assessment and Plan PT Assessment and Plan Clinical Impression Statement: Began rockerboard to improve weight distribution with gait mechanics, pt required manual assistance to complete new activity due to weakness.  Min cueing required for proper form with mat activities, pt able to complete without difficulty noted visible musculature fatigue.  Added NuStep for strengthening and activity tolerancee. PT Plan: Continue with current POC for functional strengthening.     Goals    Problem List Patient Active Problem List  Diagnosis  . DERMATOMYCOSIS  . DEPRESSION, SEVERE  . MIGRAINE  WITH AURA  . ALLERGIC RHINITIS CAUSE UNSPECIFIED  . CONSTIPATION  . IBS  . SUBLUXATION PATELLAR (MALALIGNMENT)  . KNEE PAIN, BILATERAL  . FATIGUE  . HEADACHE  . ADVERSE DRUG REACTION  . GASTRITIS  . BACK PAIN  . ABDOMINAL PAIN  . Rectal bleed  . Unintentional weight loss  . IBS (irritable bowel syndrome)  . GERD (gastroesophageal reflux disease)  . Patellar malalignment syndrome  . S/P left knee arthroscopy  . Patellar dislocation  . Knee pain  . HTN (hypertension), benign  . Nicotine dependence  . Stiffness of joint, not elsewhere classified, lower leg    PT - End of Session Activity Tolerance: Patient tolerated treatment well General Behavior During Session: [facility] for tasks performed Cognition: [facility] for tasks performed  GP    Juel Burrow 11/05/2012, 5:11 PM

## 2012-11-08 ENCOUNTER — Ambulatory Visit (HOSPITAL_COMMUNITY)
Admission: RE | Admit: 2012-11-08 | Discharge: 2012-11-08 | Disposition: A | Discharge status: Home / Self Care | Payer: Medicare Other | Source: Ambulatory Visit | Attending: Orthopedic Surgery | Admitting: Orthopedic Surgery

## 2012-11-08 DIAGNOSIS — IMO0001 Reserved for inherently not codable concepts without codable children: Secondary | ICD-10-CM | POA: Diagnosis not present

## 2012-11-08 DIAGNOSIS — I10 Essential (primary) hypertension: Secondary | ICD-10-CM | POA: Diagnosis not present

## 2012-11-08 DIAGNOSIS — M25669 Stiffness of unspecified knee, not elsewhere classified: Secondary | ICD-10-CM | POA: Diagnosis not present

## 2012-11-08 DIAGNOSIS — M25569 Pain in unspecified knee: Secondary | ICD-10-CM | POA: Diagnosis not present

## 2012-11-08 DIAGNOSIS — M6281 Muscle weakness (generalized): Secondary | ICD-10-CM | POA: Diagnosis not present

## 2012-11-08 NOTE — Progress Notes (Signed)
Physical Therapy Treatment Patient Details  Name: Martha Montgomery MRN: [id number] Date of Birth: May 09, 1981  Today's Date: 11/08/2012 Time: 2130-8657 PT Time Calculation (min): 38 min Charges: 70' TE Visit#: 4 of 12  Re-eval: 11/26/12    Authorization: medicare  Authorization Time Period:    Authorization Visit#: 4 of 10   Subjective: Symptoms/Limitations Symptoms: "HEP is going well"  Reports knee pain about 4/10.  Pain Assessment Currently in Pain?: Yes Pain Score:   4 Pain Location: Knee Pain Orientation: Right;Left Pain Type: Acute pain;Chronic pain  Precautions/Restrictions     Exercise/Treatments Aerobic Stationary Bike: Bike 7 minutes resistance 3 for ROM.  Machines for Strengthening Cybex Knee Extension: 1 PL 10 reps BLE Cybex Knee Flexion: 2 PL BLE x10 reps Standing Lateral Step Up: Both;Hand Hold: 1;10 reps;Step Height: 4" Forward Step Up: Both;10 reps;Hand Hold: 1;Step Height: 4" Step Down: Both;10 reps;Hand Hold: 1;Step Height: 4" Wall Squat: 10 reps;5 seconds Rocker Board: 2 minutes;Limitations Rocker Board Limitations: R/L  Supine Straight Leg Raises: Both;15 reps Straight Leg Raise with External Rotation: Both;10 reps  Physical Therapy Assessment and Plan PT Assessment and Plan Clinical Impression Statement: Continued with exercises to improve LE strength.  Continues to demonstrate significant weakness with SLR and wall squats and has significant difficulty with SLR w/hip ER due to LE weakness.  Able to complete all exercises without increased pain.  PT Plan: Continue with current POC for functional strengthening.     Goals    Problem List Patient Active Problem List  Diagnosis  . DERMATOMYCOSIS  . DEPRESSION, SEVERE  . MIGRAINE WITH AURA  . ALLERGIC RHINITIS CAUSE UNSPECIFIED  . CONSTIPATION  . IBS  . SUBLUXATION PATELLAR (MALALIGNMENT)  . KNEE PAIN, BILATERAL  . FATIGUE  . HEADACHE  . ADVERSE DRUG REACTION  . GASTRITIS  . BACK PAIN   . ABDOMINAL PAIN  . Rectal bleed  . Unintentional weight loss  . IBS (irritable bowel syndrome)  . GERD (gastroesophageal reflux disease)  . Patellar malalignment syndrome  . S/P left knee arthroscopy  . Patellar dislocation  . Knee pain  . HTN (hypertension), benign  . Nicotine dependence  . Stiffness of joint, not elsewhere classified, lower leg    PT - End of Session Activity Tolerance: Patient tolerated treatment well General Behavior During Session: [facility] for tasks performed Cognition: [facility] for tasks performed  GP    Javae Braaten 11/08/2012, 4:25 PM

## 2012-11-10 ENCOUNTER — Inpatient Hospital Stay (HOSPITAL_COMMUNITY): Admission: RE | Admit: 2012-11-10 | Payer: Medicare Other | Source: Ambulatory Visit

## 2012-11-12 ENCOUNTER — Ambulatory Visit (HOSPITAL_COMMUNITY)
Admission: RE | Admit: 2012-11-12 | Discharge: 2012-11-12 | Disposition: A | Discharge status: Home / Self Care | Payer: Medicare Other | Source: Ambulatory Visit | Attending: Family Medicine | Admitting: Family Medicine

## 2012-11-12 DIAGNOSIS — I10 Essential (primary) hypertension: Secondary | ICD-10-CM | POA: Diagnosis not present

## 2012-11-12 DIAGNOSIS — M6281 Muscle weakness (generalized): Secondary | ICD-10-CM | POA: Diagnosis not present

## 2012-11-12 DIAGNOSIS — M25569 Pain in unspecified knee: Secondary | ICD-10-CM | POA: Diagnosis not present

## 2012-11-12 DIAGNOSIS — IMO0001 Reserved for inherently not codable concepts without codable children: Secondary | ICD-10-CM | POA: Diagnosis not present

## 2012-11-12 DIAGNOSIS — M25669 Stiffness of unspecified knee, not elsewhere classified: Secondary | ICD-10-CM | POA: Diagnosis not present

## 2012-11-12 NOTE — Progress Notes (Signed)
Physical Therapy Treatment Patient Details  Name: Martha Montgomery MRN: [id number] Date of Birth: 1980/11/15  Today's Date: 11/12/2012 Time: 1191-4782 PT Time Calculation (min): 50 min Charge: Therex 38', ice x 10'  Visit#: 5 of 12  Re-eval: 11/26/12    Authorization: medicare  Authorization Time Period:    Authorization Visit#: 5 of 10   Subjective: Symptoms/Limitations Symptoms: Soreness today, pain scale 4/10 Pain Assessment Currently in Pain?: Yes Pain Score:   4 Pain Location: Knee Pain Orientation: Left;Right  Objective:   Exercise/Treatments Aerobic Stationary Bike: Bike 8 minutes resistance 3 for ROM seat  Machines for Strengthening Cybex Knee Extension: 2PL 15x Bil LE Cybex Knee Flexion: 3 PL 15x Bil LE Standing Lateral Step Up: Both;15 reps;Step Height: 4";Hand Hold: 1 Forward Step Up: Both;15 reps;Hand Hold: 1;Step Height: 4" Step Down: Both;15 reps;Hand Hold: 1;Step Height: 4" Wall Squat: 15 reps;5 seconds Rocker Board: 2 minutes;Limitations Rocker Board Limitations: R/L  SLS with Vectors: 5x 5" with intermittent HHA Supine Straight Leg Raises: Both;15 reps;Limitations Straight Leg Raises Limitations: 3# Straight Leg Raise with External Rotation: 15 reps;Limitations Straight Leg Raise with External Rotation Limitations: 3#  Physical Therapy Assessment and Plan PT Assessment and Plan Clinical Impression Statement: Added vector stance to improve hip strengthening and balance, noted visible musculature fatigue.  Able to increase reps with therex without difficulty.  Ice applied end of session for pain control. PT Plan: Continue with current POC for functional strengthening.     Goals    Problem List Patient Active Problem List  Diagnosis  . DERMATOMYCOSIS  . DEPRESSION, SEVERE  . MIGRAINE WITH AURA  . ALLERGIC RHINITIS CAUSE UNSPECIFIED  . CONSTIPATION  . IBS  . SUBLUXATION PATELLAR (MALALIGNMENT)  . KNEE PAIN, BILATERAL  . FATIGUE  . HEADACHE   . ADVERSE DRUG REACTION  . GASTRITIS  . BACK PAIN  . ABDOMINAL PAIN  . Rectal bleed  . Unintentional weight loss  . IBS (irritable bowel syndrome)  . GERD (gastroesophageal reflux disease)  . Patellar malalignment syndrome  . S/P left knee arthroscopy  . Patellar dislocation  . Knee pain  . HTN (hypertension), benign  . Nicotine dependence  . Stiffness of joint, not elsewhere classified, lower leg    PT - End of Session Activity Tolerance: Patient tolerated treatment well General Behavior During Therapy: [facility] for tasks assessed/performed Cognition: [facility] for tasks performed  GP    Juel Burrow 11/12/2012, 4:50 PM

## 2012-11-15 ENCOUNTER — Ambulatory Visit (HOSPITAL_COMMUNITY): Payer: Medicare Other | Admitting: Physical Therapy

## 2012-11-17 ENCOUNTER — Ambulatory Visit (HOSPITAL_COMMUNITY)
Admission: RE | Admit: 2012-11-17 | Discharge: 2012-11-17 | Disposition: A | Discharge status: Home / Self Care | Payer: Medicare Other | Source: Ambulatory Visit | Attending: Orthopedic Surgery | Admitting: Orthopedic Surgery

## 2012-11-17 DIAGNOSIS — IMO0001 Reserved for inherently not codable concepts without codable children: Secondary | ICD-10-CM | POA: Diagnosis not present

## 2012-11-17 DIAGNOSIS — M25569 Pain in unspecified knee: Secondary | ICD-10-CM | POA: Diagnosis not present

## 2012-11-17 DIAGNOSIS — M6281 Muscle weakness (generalized): Secondary | ICD-10-CM | POA: Diagnosis not present

## 2012-11-17 DIAGNOSIS — I10 Essential (primary) hypertension: Secondary | ICD-10-CM | POA: Diagnosis not present

## 2012-11-17 DIAGNOSIS — M25669 Stiffness of unspecified knee, not elsewhere classified: Secondary | ICD-10-CM | POA: Diagnosis not present

## 2012-11-17 NOTE — Progress Notes (Signed)
Physical Therapy Treatment Patient Details  Name: Martha Montgomery MRN: [id number] Date of Birth: 1980/11/09  Today's Date: 11/17/2012 Time: 4098-1191 PT Time Calculation (min): 46 min Charge: Therex 36', Ice x 10'  Visit#: 6 of 12  Re-eval: 11/26/12 Assessment Diagnosis: Knee pain Surgical Date: 04/02/13 Next MD Visit: Dr. Romeo Apple  Prior Therapy: 3 years ago  Authorization: medicare  Authorization Time Period:    Authorization Visit#: 6 of 10   Subjective: Symptoms/Limitations Symptoms: Bil knee pain 4/10 Lt >Rt pain Pain Assessment Currently in Pain?: Yes Pain Score:   4 Pain Location: Knee Pain Orientation: Left;Right  Objective:  Exercise/Treatments Aerobic Stationary Bike: Bike 8 minutes resistance 3 for ROM seat 6 Machines for Strengthening Cybex Knee Extension: 2PL 2x 15 Bil LE Cybex Knee Flexion: 3.5  PL 2x15 Bil LE Standing Lateral Step Up: Both;15 reps;Hand Hold: 1;Step Height: 6" Step Down: Left;15 reps;Hand Hold: 1;Step Height: 6";Right;Step Height: 4" Functional Squat: 5 reps;Limitations Functional Squat Limitations: chair pose 5x 15" holds SLS with Vectors: Bil LE 5x 5" with intermittent HHA   Modalities Modalities: Cryotherapy Cryotherapy Number Minutes Cryotherapy: 10 Minutes Cryotherapy Location: Knee Type of Cryotherapy: Ice pack  Physical Therapy Assessment and Plan PT Assessment and Plan Clinical Impression Statement: Functional strengthening improving, able to increase step height with stair training with minimal difficulty.  Added chair pose for quad strengthening with visible musculature fatigue noted.  Ice applied end of session for pain control. PT Plan: Continue with current POC for functional strengthening. Begin aerobic on nustep or elliptical next session for strengthening and increased activity tolerance.    Goals    Problem List Patient Active Problem List  Diagnosis  . DERMATOMYCOSIS  . DEPRESSION, SEVERE  . MIGRAINE WITH  AURA  . ALLERGIC RHINITIS CAUSE UNSPECIFIED  . CONSTIPATION  . IBS  . SUBLUXATION PATELLAR (MALALIGNMENT)  . KNEE PAIN, BILATERAL  . FATIGUE  . HEADACHE  . ADVERSE DRUG REACTION  . GASTRITIS  . BACK PAIN  . ABDOMINAL PAIN  . Rectal bleed  . Unintentional weight loss  . IBS (irritable bowel syndrome)  . GERD (gastroesophageal reflux disease)  . Patellar malalignment syndrome  . S/P left knee arthroscopy  . Patellar dislocation  . Knee pain  . HTN (hypertension), benign  . Nicotine dependence  . Stiffness of joint, not elsewhere classified, lower leg    PT - End of Session Activity Tolerance: Patient tolerated treatment well;Patient limited by fatigue General Behavior During Therapy: [facility] for tasks assessed/performed Cognition: WFL for tasks performed  GP    Juel Burrow 11/17/2012, 5:51 PM

## 2012-11-19 ENCOUNTER — Ambulatory Visit (HOSPITAL_COMMUNITY)
Admission: RE | Admit: 2012-11-19 | Discharge: 2012-11-19 | Disposition: A | Discharge status: Home / Self Care | Payer: Medicare Other | Source: Ambulatory Visit | Attending: Orthopedic Surgery | Admitting: Orthopedic Surgery

## 2012-11-19 DIAGNOSIS — I10 Essential (primary) hypertension: Secondary | ICD-10-CM | POA: Diagnosis not present

## 2012-11-19 DIAGNOSIS — M25669 Stiffness of unspecified knee, not elsewhere classified: Secondary | ICD-10-CM | POA: Diagnosis not present

## 2012-11-19 DIAGNOSIS — M25569 Pain in unspecified knee: Secondary | ICD-10-CM | POA: Diagnosis not present

## 2012-11-19 DIAGNOSIS — IMO0001 Reserved for inherently not codable concepts without codable children: Secondary | ICD-10-CM | POA: Diagnosis not present

## 2012-11-19 DIAGNOSIS — M6281 Muscle weakness (generalized): Secondary | ICD-10-CM | POA: Diagnosis not present

## 2012-11-19 NOTE — Progress Notes (Signed)
Physical Therapy Treatment Patient Details  Name: Martha Montgomery MRN: [id number] Date of Birth: 06/28/1981  Today's Date: 11/19/2012 Time: 4098-1191 PT Time Calculation (min): 46 min Charge; Therex 36', Ice x 10'  Visit#: 7 of 12  Re-eval: 11/26/12    Authorization: medicare  Authorization Time Period:    Authorization Visit#: 7 of 10   Subjective: Symptoms/Limitations Symptoms: Bil knee pain 5/10 today Pain Assessment Currently in Pain?: Yes Pain Score:   5 Pain Location: Knee Pain Orientation: Right;Left  Objective:  Exercise/Treatments Aerobic Stationary Bike: NuStep x 10 min hill #2 resistance 2 Machines for Strengthening Cybex Knee Extension: 2PL 2x 15 Bil LE Cybex Knee Flexion: 3.5  PL 2x15 Bil LE Standing Forward Lunges: Both;10 reps Lateral Step Up: Both;15 reps;Hand Hold: 0;Step Height: 6" Forward Step Up: Both;15 reps;Hand Hold: 0;Step Height: 6"     Physical Therapy Assessment and Plan PT Assessment and Plan Clinical Impression Statement: Added forward lunges for functional strengthening, pt able to demonstrate appropriate technique following cues for proper form.  Noted visible musculature fatigue with new activity  Ice applied end of session for pain control. PT Plan: Continue with current POC for functional strengthening.     Goals    Problem List Patient Active Problem List  Diagnosis  . DERMATOMYCOSIS  . DEPRESSION, SEVERE  . MIGRAINE WITH AURA  . ALLERGIC RHINITIS CAUSE UNSPECIFIED  . CONSTIPATION  . IBS  . SUBLUXATION PATELLAR (MALALIGNMENT)  . KNEE PAIN, BILATERAL  . FATIGUE  . HEADACHE  . ADVERSE DRUG REACTION  . GASTRITIS  . BACK PAIN  . ABDOMINAL PAIN  . Rectal bleed  . Unintentional weight loss  . IBS (irritable bowel syndrome)  . GERD (gastroesophageal reflux disease)  . Patellar malalignment syndrome  . S/P left knee arthroscopy  . Patellar dislocation  . Knee pain  . HTN (hypertension), benign  . Nicotine dependence   . Stiffness of joint, not elsewhere classified, lower leg    PT - End of Session Activity Tolerance: Patient tolerated treatment well;Patient limited by fatigue General Behavior During Therapy: [facility] for tasks assessed/performed Cognition: WFL for tasks performed  GP    Juel Burrow 11/19/2012, 3:21 PM

## 2012-11-22 ENCOUNTER — Ambulatory Visit (HOSPITAL_COMMUNITY): Payer: Medicare Other | Admitting: Physical Therapy

## 2012-11-24 ENCOUNTER — Inpatient Hospital Stay (HOSPITAL_COMMUNITY): Admission: RE | Admit: 2012-11-24 | Payer: Medicare Other | Source: Ambulatory Visit

## 2013-01-12 DIAGNOSIS — J309 Allergic rhinitis, unspecified: Secondary | ICD-10-CM | POA: Diagnosis not present

## 2013-01-12 DIAGNOSIS — I10 Essential (primary) hypertension: Secondary | ICD-10-CM | POA: Diagnosis not present

## 2013-01-12 DIAGNOSIS — G43719 Chronic migraine without aura, intractable, without status migrainosus: Secondary | ICD-10-CM | POA: Diagnosis not present

## 2013-01-12 DIAGNOSIS — Z79899 Other long term (current) drug therapy: Secondary | ICD-10-CM | POA: Diagnosis not present

## 2013-01-26 ENCOUNTER — Ambulatory Visit: Payer: Medicare Other

## 2013-02-04 ENCOUNTER — Emergency Department (HOSPITAL_COMMUNITY)
Admission: EM | Admit: 2013-02-04 | Discharge: 2013-02-04 | Disposition: A | Discharge status: Home / Self Care | Payer: Medicare Other | Attending: Emergency Medicine | Admitting: Emergency Medicine

## 2013-02-04 ENCOUNTER — Encounter (HOSPITAL_COMMUNITY): Payer: Self-pay

## 2013-02-04 DIAGNOSIS — I10 Essential (primary) hypertension: Secondary | ICD-10-CM | POA: Diagnosis not present

## 2013-02-04 DIAGNOSIS — F329 Major depressive disorder, single episode, unspecified: Secondary | ICD-10-CM | POA: Diagnosis not present

## 2013-02-04 DIAGNOSIS — Z79899 Other long term (current) drug therapy: Secondary | ICD-10-CM | POA: Diagnosis not present

## 2013-02-04 DIAGNOSIS — F17200 Nicotine dependence, unspecified, uncomplicated: Secondary | ICD-10-CM | POA: Diagnosis not present

## 2013-02-04 DIAGNOSIS — Z862 Personal history of diseases of the blood and blood-forming organs and certain disorders involving the immune mechanism: Secondary | ICD-10-CM | POA: Diagnosis not present

## 2013-02-04 DIAGNOSIS — Z8639 Personal history of other endocrine, nutritional and metabolic disease: Secondary | ICD-10-CM | POA: Insufficient documentation

## 2013-02-04 DIAGNOSIS — R6889 Other general symptoms and signs: Secondary | ICD-10-CM | POA: Diagnosis not present

## 2013-02-04 DIAGNOSIS — F3289 Other specified depressive episodes: Secondary | ICD-10-CM | POA: Insufficient documentation

## 2013-02-04 DIAGNOSIS — Z8719 Personal history of other diseases of the digestive system: Secondary | ICD-10-CM | POA: Diagnosis not present

## 2013-02-04 DIAGNOSIS — F411 Generalized anxiety disorder: Secondary | ICD-10-CM | POA: Insufficient documentation

## 2013-02-04 DIAGNOSIS — R51 Headache: Secondary | ICD-10-CM | POA: Insufficient documentation

## 2013-02-04 DIAGNOSIS — R221 Localized swelling, mass and lump, neck: Secondary | ICD-10-CM

## 2013-02-04 DIAGNOSIS — R059 Cough, unspecified: Secondary | ICD-10-CM | POA: Insufficient documentation

## 2013-02-04 DIAGNOSIS — G43909 Migraine, unspecified, not intractable, without status migrainosus: Secondary | ICD-10-CM | POA: Insufficient documentation

## 2013-02-04 DIAGNOSIS — R05 Cough: Secondary | ICD-10-CM | POA: Insufficient documentation

## 2013-02-04 DIAGNOSIS — M5382 Other specified dorsopathies, cervical region: Secondary | ICD-10-CM | POA: Diagnosis not present

## 2013-02-04 NOTE — ED Notes (Signed)
Pt says she feels her thyroid is "inflammed". Had an U/S done last year .  Feel that her swallowing is bothered by this , NAD

## 2013-02-04 NOTE — ED Provider Notes (Signed)
History    CSN: [id number] Arrival date & time 02/04/13  1918  First MD Initiated Contact with Patient 02/04/13 2133     Chief Complaint  Patient presents with  . Thyroid Problem   (Consider location/radiation/quality/duration/timing/severity/associated sxs/prior Treatment) HPI Comments: Patient states that she has been diagnosed with" thyroid issues" for about a year. Patient states that it has been 6 months or more since her last evaluation concerning her thyroid. The patient states that she felt as though her neck  was" inflamed" or swollen on yesterday. Today the swelling sensation has improved, but not resolved. Pt states she can tell her air way is open, but questions if the thyroid swelling may be causing a different sensation. No chest pain. Mild cough related to sinus drip. No sore throat. No difficulty swallowing. She does feel that at times her food gets stuck.  She has not taken any medications for this sensation.  The history is provided by the patient.   Past Medical History  Diagnosis Date  . Hypertension x 6 years   . Anxiety disorder x6 years   . Depression   . Migraines x6 years   . Thyroid goiter   . Gastritis nov. 2011    EGD by Dr. Darrick Penna, negative h pylori   Past Surgical History  Procedure Laterality Date  . Cesarean section    . Abscesses removed from right buttock / left arm    . Chondroplasty  04/02/2012    Procedure: CHONDROPLASTY;  Surgeon: Vickki Hearing, MD;  Location: [facility];  Service: Orthopedics;  Laterality: Left;  of left patella   Family History  Problem Relation Age of Onset  . Arthritis Mother   . Migraines Mother   . Hypertension Mother   . Asthma      family history    History  Substance Use Topics  . Smoking status: Current Every Day Smoker -- 1.00 packs/day for 5 years    Types: Cigarettes    Last Attempt to Quit: 09/11/2010  . Smokeless tobacco: Not on file  . Alcohol Use: Yes     Comment: couple times per yr   OB  History   Grav Para Term Preterm Abortions TAB SAB Ect Mult Living                 Review of Systems  Constitutional: Negative for activity change.       All ROS Neg except as noted in HPI  HENT: Negative for nosebleeds and neck pain.   Eyes: Negative for photophobia and discharge.  Respiratory: Negative for cough, shortness of breath and wheezing.   Cardiovascular: Negative for chest pain and palpitations.  Gastrointestinal: Negative for abdominal pain and blood in stool.  Genitourinary: Negative for dysuria, frequency and hematuria.  Musculoskeletal: Negative for back pain and arthralgias.  Skin: Negative.   Neurological: Positive for headaches. Negative for dizziness, seizures and speech difficulty.  Psychiatric/Behavioral: Negative for hallucinations and confusion. The patient is nervous/anxious.        Depression    Allergies  Codeine and Soybean-containing drug products  Home Medications   Current Outpatient Rx  Name  Route  Sig  Dispense  Refill  . buPROPion (WELLBUTRIN XL) 300 MG 24 hr tablet   Oral   Take 300 mg by mouth daily.         . hydrochlorothiazide (MICROZIDE) 12.5 MG capsule   Oral   Take 1 capsule (12.5 mg total) by mouth daily.   30 capsule  3   . metoprolol (LOPRESSOR) 50 MG tablet   Oral   Take 1 tablet (50 mg total) by mouth 2 (two) times daily.   60 tablet   3     Dose increase effective 06/30/2012   . omeprazole (PRILOSEC) 20 MG capsule   Oral   Take 20 mg by mouth daily as needed. Acid Reflux         . oxymetazoline (NASAL SPRAY 12 HOUR) 0.05 % nasal spray   Nasal   Place 2 sprays into the nose daily as needed. Congestion         . topiramate (TOPAMAX) 100 MG tablet   Oral   Take 100 mg by mouth daily.         . traMADol-acetaminophen (ULTRACET) 37.5-325 MG per tablet   Oral   Take 1 tablet by mouth every 4 (four) hours as needed for pain.   90 tablet   5   . medroxyPROGESTERone (DEPO-PROVERA) 150 MG/ML injection    Intramuscular   Inject 150 mg into the muscle every 3 (three) months.          BP 140/91  Pulse 86  Temp(Src) 99.4 F (37.4 C) (Oral)  Resp 18  Ht 5\' 3"  (1.6 m)  Wt 128 lb (58.06 kg)  BMI 22.68 kg/m2  SpO2 100% Physical Exam  Nursing note and vitals reviewed. Constitutional: She is oriented to person, place, and time. She appears well-developed and well-nourished.  Non-toxic appearance.  HENT:  Head: Normocephalic.  Right Ear: Tympanic membrane and external ear normal.  Left Ear: Tympanic membrane and external ear normal.  Eyes: EOM and lids are normal. Pupils are equal, round, and reactive to light.  Neck: Normal range of motion. Neck supple. Carotid bruit is not present.  Patient is a few small palpable lymph nodes in the submental area and one or 2 of the upper neck. There is some mild prominence of the bile of the thyroid. The trachea is in the midline. There is no hot areas about the thyroid or the neck. The patient has full range of motion of the neck. There no carotid bruits appreciated. There is no stridor on auscultation.  Cardiovascular: Normal rate, regular rhythm, normal heart sounds, intact distal pulses and normal pulses.   Pulmonary/Chest: Breath sounds normal. No respiratory distress.  Abdominal: Soft. Bowel sounds are normal. There is no tenderness. There is no guarding.  Musculoskeletal: Normal range of motion.  Lymphadenopathy:       Head (right side): No submandibular adenopathy present.       Head (left side): No submandibular adenopathy present.    She has no cervical adenopathy.  Neurological: She is alert and oriented to person, place, and time. She has normal strength. No cranial nerve deficit or sensory deficit.  Skin: Skin is warm and dry.  Psychiatric: She has a normal mood and affect. Her speech is normal.    ED Course  Procedures (including critical care time) Labs Reviewed - No data to display No results found. No diagnosis found.  MDM  I  have reviewed nursing notes, vital signs, and all appropriate lab and imaging results for this patient. Temp 99.4, slightly elevated, blood pressure 140/91, slightly elevated; otherwise vital signs stable. Pulse oximetry 100% on room air, within normal limits by my interpretation.  On examination tonight there is no stridor. There is no palpable mass involving the neck. The trachea is in the midline. The patient was reassured with these findings as well  as the pulse oximetry 100%. I have strongly suggested to the patient that she see her primary physician who is more familiar with how her neck feels as well as thyroid related swelling or enlargement. Patient is in agreement with this plan. Patient is encouraged to return to the emergency department if any changes, problems, or concerns.  Kathie Dike, PA-C 02/04/13 2206

## 2013-02-04 NOTE — ED Notes (Signed)
I have had issues with my thyroid being hyper. I noticed that my neck was inflamed yesterday per pt.

## 2013-02-05 NOTE — ED Provider Notes (Signed)
Medical screening examination/treatment/procedure(s) were performed by non-physician practitioner and as supervising physician I was immediately available for consultation/collaboration.  Garmon Dehn, MD 02/05/13 1540 

## 2013-02-07 ENCOUNTER — Other Ambulatory Visit: Payer: Self-pay | Admitting: Family Medicine

## 2013-02-14 ENCOUNTER — Encounter: Payer: Self-pay | Admitting: Family Medicine

## 2013-02-14 ENCOUNTER — Encounter: Payer: Medicare Other | Admitting: Family Medicine

## 2013-02-23 ENCOUNTER — Encounter: Payer: Self-pay | Admitting: Women's Health

## 2013-02-23 ENCOUNTER — Encounter: Payer: Self-pay | Admitting: *Deleted

## 2013-02-24 ENCOUNTER — Other Ambulatory Visit (HOSPITAL_COMMUNITY)
Admission: RE | Admit: 2013-02-24 | Discharge: 2013-02-24 | Disposition: A | Discharge status: Home / Self Care | Payer: Medicare Other | Source: Ambulatory Visit | Attending: Family Medicine | Admitting: Family Medicine

## 2013-02-24 ENCOUNTER — Encounter: Payer: Self-pay | Admitting: Family Medicine

## 2013-02-24 ENCOUNTER — Ambulatory Visit (INDEPENDENT_AMBULATORY_CARE_PROVIDER_SITE_OTHER): Payer: Medicare Other | Admitting: Family Medicine

## 2013-02-24 VITALS — BP 122/80 | HR 75 | Resp 16 | Wt 137.0 lb

## 2013-02-24 DIAGNOSIS — G43109 Migraine with aura, not intractable, without status migrainosus: Secondary | ICD-10-CM

## 2013-02-24 DIAGNOSIS — R5383 Other fatigue: Secondary | ICD-10-CM | POA: Diagnosis not present

## 2013-02-24 DIAGNOSIS — Z113 Encounter for screening for infections with a predominantly sexual mode of transmission: Secondary | ICD-10-CM | POA: Insufficient documentation

## 2013-02-24 DIAGNOSIS — Z79899 Other long term (current) drug therapy: Secondary | ICD-10-CM

## 2013-02-24 DIAGNOSIS — I10 Essential (primary) hypertension: Secondary | ICD-10-CM

## 2013-02-24 DIAGNOSIS — R5381 Other malaise: Secondary | ICD-10-CM | POA: Diagnosis not present

## 2013-02-24 DIAGNOSIS — N760 Acute vaginitis: Secondary | ICD-10-CM

## 2013-02-24 DIAGNOSIS — F3289 Other specified depressive episodes: Secondary | ICD-10-CM

## 2013-02-24 DIAGNOSIS — F329 Major depressive disorder, single episode, unspecified: Secondary | ICD-10-CM

## 2013-02-24 NOTE — Patient Instructions (Addendum)
Annual wellness  in 3.5 month, call if you need me before  Call for flu vaccine in October  Cultures are sent you will get results on "My chart"  Fasting CBC, lipid, chem 7 and HSV 2 titer as soon as possible  Stool is slightly yellow/green currently, likely related to food eaten recently, no cause for concern on exam at thsi time

## 2013-02-24 NOTE — Progress Notes (Signed)
  Subjective:    Patient ID: Martha Montgomery, female    DOB: October 29, 1980, 32 y.o.   MRN: [id number]  HPI  States that last night after a regular BM, she had a green mucoid appearing smelly discharge from her anus, first time ever , no fever or chills. Generally BM's are regular, denies abdominal pain or nausea Has vaginal itching and burning x 2 weeks Still in therapy for depression, she has temporarily seaparated from her spouse, uses her spiritual growth as a reason for the separation. Challenged by behavioral/mental health problems in her son and nephew. Medication for depression is through psychiatry also. Has new joint issues and plans to contact ortho about this. Headaches are controlled, and medication is through neurology   Review of Systems See HPI Denies recent fever or chills. Denies sinus pressure, nasal congestion, ear pain or sore throat. Denies chest congestion, productive cough or wheezing. Denies chest pains, palpitations and leg swelling Denies abdominal pain, nausea, vomiting,diarrhea or constipation.   Denies dysuria, frequency, hesitancy or incontinence. Denies joint pain, swelling and limitation in mobility.  Denies skin break down or rash.        Objective:   Physical Exam Patient alert and oriented and in no cardiopulmonary distress.  HEENT: No facial asymmetry, EOMI, no sinus tenderness,  oropharynx pink and moist.  Neck supple no adenopathy.  Chest: Clear to auscultation bilaterally.  CVS: S1, S2 no murmurs, no S3.  ABD: Soft non tender. Bowel sounds normal. Rectal:no tear, no blood or mucus, stool is yellow green and soft and heme negative Ext: No edema Pelvic: white physiologic discharge, no cervical motion or adnexal tenderness MS: Adequate ROM spine, shoulders, hips and knees.  Skin: Intact, no ulcerations or rash noted.  Psych: Good eye contact, blunted affect. Memory intact not anxious or depressed appearing.  CNS: CN 2-12 intact, power, tone  and sensation normal throughout.        Assessment & Plan:

## 2013-02-25 LAB — BASIC METABOLIC PANEL
CO2: 26 mEq/L (ref 19–32)
Calcium: 9.3 mg/dL (ref 8.4–10.5)
Chloride: 109 mEq/L (ref 96–112)
Creat: 0.74 mg/dL (ref 0.50–1.10)
Sodium: 140 mEq/L (ref 135–145)

## 2013-02-25 LAB — CBC WITH DIFFERENTIAL/PLATELET
Basophils Absolute: 0 10*3/uL (ref 0.0–0.1)
Basophils Relative: 1 % (ref 0–1)
Eosinophils Relative: 1 % (ref 0–5)
HCT: 37 % (ref 36.0–46.0)
Hemoglobin: 12.4 g/dL (ref 12.0–15.0)
MCH: 30.5 pg (ref 26.0–34.0)
MCHC: 33.5 g/dL (ref 30.0–36.0)
MCV: 91.1 fL (ref 78.0–100.0)
Monocytes Absolute: 0.4 10*3/uL (ref 0.1–1.0)
Monocytes Relative: 6 % (ref 3–12)
RDW: 13.7 % (ref 11.5–15.5)

## 2013-02-25 LAB — LIPID PANEL
LDL Cholesterol: 106 mg/dL — ABNORMAL HIGH (ref 0–99)
VLDL: 15 mg/dL (ref 0–40)

## 2013-02-26 DIAGNOSIS — N760 Acute vaginitis: Secondary | ICD-10-CM | POA: Insufficient documentation

## 2013-02-26 NOTE — Assessment & Plan Note (Signed)
Improved, treat by psychiatry and in therapy regularly

## 2013-02-26 NOTE — Assessment & Plan Note (Signed)
Swabs sent, treatment will be based on result

## 2013-02-26 NOTE — Assessment & Plan Note (Signed)
Improved control, treated by neurology

## 2013-02-26 NOTE — Assessment & Plan Note (Signed)
Controlled, no change in medication DASH diet and commitment to daily physical activity for a minimum of 30 minutes discussed and encouraged, as a part of hypertension management. The importance of attaining a healthy weight is also discussed.  

## 2013-03-13 ENCOUNTER — Other Ambulatory Visit: Payer: Self-pay | Admitting: Family Medicine

## 2013-03-14 ENCOUNTER — Encounter: Payer: Self-pay | Admitting: Women's Health

## 2013-03-14 ENCOUNTER — Ambulatory Visit (INDEPENDENT_AMBULATORY_CARE_PROVIDER_SITE_OTHER): Payer: Medicare Other | Admitting: Women's Health

## 2013-03-14 VITALS — BP 120/80 | Ht 63.0 in | Wt 135.5 lb

## 2013-03-14 DIAGNOSIS — Z309 Encounter for contraceptive management, unspecified: Secondary | ICD-10-CM

## 2013-03-14 DIAGNOSIS — Z3009 Encounter for other general counseling and advice on contraception: Secondary | ICD-10-CM

## 2013-03-14 DIAGNOSIS — Z3202 Encounter for pregnancy test, result negative: Secondary | ICD-10-CM | POA: Diagnosis not present

## 2013-03-14 DIAGNOSIS — Z3049 Encounter for surveillance of other contraceptives: Secondary | ICD-10-CM | POA: Diagnosis not present

## 2013-03-14 MED ORDER — MEDROXYPROGESTERONE ACETATE 150 MG/ML IM SUSP: 150.0000 mg | INTRAMUSCULAR | Status: DC

## 2013-03-14 NOTE — Progress Notes (Signed)
Patient ID: Martha Montgomery, female   DOB: 1981/04/25, 32 y.o.   MRN: [id number] Martha Montgomery is a 32 y.o. who was referred to our office from Dr. Lodema Hong. States she has been getting Depo injections at Dr. Anthony Sar for years, and when went for last injection at beginning of July, was told her insurance (Medicare) no longer covers contraception and they would not be able to give it to her. Discussed that it would be no different here, that it was an issue w/ insurance. Talked about option of paying out of pocket. Pt states she would like rx and will see how much it would be out of pocket. Not currently sexually active, married but separated at this time.  Has done well on depo in past, no problems.  Just had pap 2wks ago  Past Medical History  Diagnosis Date  . Hypertension x 6 years   . Anxiety disorder x6 years   . Depression   . Migraines x6 years   . Thyroid goiter   . Gastritis nov. 2011    EGD by Dr. Darrick Penna, negative h pylori    O: BP 120/80  Ht 5\' 3"  (1.6 m)  Wt 135 lb 8 oz (61.462 kg)  BMI 24.01 kg/m2   A: Contraception management discussion  P: Depo rx e-scribed to pharmacy, pt to check on OOP price     Option given to apply for [facility] medicaid      Call office to schedule injection if decides to pay OOP or approved for FP medicaid     Back up form of contraception if sexually active     Cheral Marker, CNM, [facility] 03/14/2013 3:17 PM

## 2013-03-14 NOTE — Patient Instructions (Addendum)

## 2013-03-16 ENCOUNTER — Other Ambulatory Visit: Payer: Medicare Other

## 2013-03-16 ENCOUNTER — Ambulatory Visit: Payer: Medicare Other

## 2013-03-17 ENCOUNTER — Encounter: Payer: Self-pay | Admitting: Obstetrics & Gynecology

## 2013-03-17 ENCOUNTER — Other Ambulatory Visit: Payer: Medicare Other

## 2013-03-17 ENCOUNTER — Ambulatory Visit (INDEPENDENT_AMBULATORY_CARE_PROVIDER_SITE_OTHER): Payer: Medicare Other | Admitting: Obstetrics & Gynecology

## 2013-03-17 ENCOUNTER — Other Ambulatory Visit: Payer: Self-pay | Admitting: Obstetrics & Gynecology

## 2013-03-17 VITALS — BP 110/74 | Ht 63.0 in | Wt 139.0 lb

## 2013-03-17 DIAGNOSIS — Z3049 Encounter for surveillance of other contraceptives: Secondary | ICD-10-CM | POA: Diagnosis not present

## 2013-03-17 DIAGNOSIS — Z3200 Encounter for pregnancy test, result unknown: Secondary | ICD-10-CM | POA: Diagnosis not present

## 2013-03-17 DIAGNOSIS — Z309 Encounter for contraceptive management, unspecified: Secondary | ICD-10-CM

## 2013-03-17 MED ORDER — MEDROXYPROGESTERONE ACETATE 150 MG/ML IM SUSP
150.0000 mg | Freq: Once | INTRAMUSCULAR | Status: AC
  Administered 2013-03-17: 150 mg via INTRAMUSCULAR

## 2013-03-17 NOTE — Progress Notes (Signed)
Patient ID: Martha Montgomery, female   DOB: 01/11/1981, 32 y.o.   MRN: [id number] Pt here for DEPO injection. Pt had QHCG done this morning which was negative.

## 2013-04-06 DIAGNOSIS — Z79899 Other long term (current) drug therapy: Secondary | ICD-10-CM | POA: Diagnosis not present

## 2013-04-06 DIAGNOSIS — G43909 Migraine, unspecified, not intractable, without status migrainosus: Secondary | ICD-10-CM | POA: Diagnosis not present

## 2013-04-06 DIAGNOSIS — G894 Chronic pain syndrome: Secondary | ICD-10-CM | POA: Diagnosis not present

## 2013-04-06 DIAGNOSIS — I10 Essential (primary) hypertension: Secondary | ICD-10-CM | POA: Diagnosis not present

## 2013-05-31 ENCOUNTER — Encounter: Payer: Medicare Other | Admitting: Family Medicine

## 2013-06-02 ENCOUNTER — Other Ambulatory Visit: Payer: Self-pay

## 2013-06-07 ENCOUNTER — Telehealth: Payer: Self-pay | Admitting: Family Medicine

## 2013-06-07 NOTE — Telephone Encounter (Signed)
If pt willing to switch to oCP the erx orthotri cyclen x 1 cycle refill x 3

## 2013-06-07 NOTE — Telephone Encounter (Signed)
Depo injection

## 2013-06-07 NOTE — Telephone Encounter (Signed)
Patient not willing to switch to [facility] pills at this time. Will try to call the health dept to see if they will administer the shot. Will call back if she changes her mind.

## 2013-06-07 NOTE — Telephone Encounter (Signed)
I don't know what else to do except switch to birth control pills?

## 2013-06-09 ENCOUNTER — Ambulatory Visit: Payer: Medicare Other

## 2013-07-04 DIAGNOSIS — R51 Headache: Secondary | ICD-10-CM | POA: Diagnosis not present

## 2013-07-04 DIAGNOSIS — G894 Chronic pain syndrome: Secondary | ICD-10-CM | POA: Diagnosis not present

## 2013-07-04 DIAGNOSIS — Z79899 Other long term (current) drug therapy: Secondary | ICD-10-CM | POA: Diagnosis not present

## 2013-07-12 ENCOUNTER — Ambulatory Visit: Payer: Medicare Other | Admitting: Gastroenterology

## 2013-07-12 ENCOUNTER — Encounter: Payer: Self-pay | Admitting: Gastroenterology

## 2013-07-27 ENCOUNTER — Ambulatory Visit: Payer: Medicare Other | Admitting: Family Medicine

## 2013-08-02 ENCOUNTER — Telehealth: Payer: Self-pay | Admitting: Family Medicine

## 2013-08-02 NOTE — Telephone Encounter (Signed)
I spoke directly with Martha Montgomery on 08/01/2013 concerning her repeated N/S and my concern about her healthj and welfare. She states she "just forgets" appts. I explained it is important to keep f/u so she can be cared for and that reptd ongoing n/s would greatly jepoardize her position to continue to be a pt in this practice as I am unable to care for her if she remains non compliant. She said she understood, and would do better. Went  On to ask about a problem she wanted addressed in the office and I directed her to call back and spk with an appt clerk , schedule an appt and keep it. I let her know date and time of her CPE  In Feb which she wrote down during the conversation

## 2013-08-05 ENCOUNTER — Ambulatory Visit (INDEPENDENT_AMBULATORY_CARE_PROVIDER_SITE_OTHER): Payer: Medicare Other | Admitting: Family Medicine

## 2013-08-05 ENCOUNTER — Encounter: Payer: Self-pay | Admitting: Family Medicine

## 2013-08-05 VITALS — BP 118/82 | HR 99 | Resp 16 | Ht 63.0 in | Wt 136.8 lb

## 2013-08-05 DIAGNOSIS — G43109 Migraine with aura, not intractable, without status migrainosus: Secondary | ICD-10-CM

## 2013-08-05 DIAGNOSIS — E049 Nontoxic goiter, unspecified: Secondary | ICD-10-CM | POA: Diagnosis not present

## 2013-08-05 DIAGNOSIS — R5381 Other malaise: Secondary | ICD-10-CM

## 2013-08-05 DIAGNOSIS — F3289 Other specified depressive episodes: Secondary | ICD-10-CM

## 2013-08-05 DIAGNOSIS — K219 Gastro-esophageal reflux disease without esophagitis: Secondary | ICD-10-CM

## 2013-08-05 DIAGNOSIS — F329 Major depressive disorder, single episode, unspecified: Secondary | ICD-10-CM

## 2013-08-05 DIAGNOSIS — I10 Essential (primary) hypertension: Secondary | ICD-10-CM

## 2013-08-05 DIAGNOSIS — J309 Allergic rhinitis, unspecified: Secondary | ICD-10-CM

## 2013-08-05 DIAGNOSIS — R5383 Other fatigue: Secondary | ICD-10-CM

## 2013-08-05 DIAGNOSIS — R51 Headache: Secondary | ICD-10-CM

## 2013-08-05 LAB — TSH: TSH: 0.997 u[IU]/mL (ref 0.350–4.500)

## 2013-08-05 LAB — BASIC METABOLIC PANEL
BUN: 11 mg/dL (ref 6–23)
CALCIUM: 9.1 mg/dL (ref 8.4–10.5)
CO2: 23 mEq/L (ref 19–32)
Chloride: 105 mEq/L (ref 96–112)
Creat: 0.82 mg/dL (ref 0.50–1.10)
GLUCOSE: 57 mg/dL — AB (ref 70–99)
POTASSIUM: 4.3 meq/L (ref 3.5–5.3)
SODIUM: 136 meq/L (ref 135–145)

## 2013-08-05 NOTE — Patient Instructions (Addendum)
CPE as before  TSH, chem 7 and rheumatoid factor  You are referred  fior an [place] of your thyroid gland

## 2013-08-06 ENCOUNTER — Encounter: Payer: Self-pay | Admitting: Family Medicine

## 2013-08-08 ENCOUNTER — Ambulatory Visit (HOSPITAL_COMMUNITY)
Admission: RE | Admit: 2013-08-08 | Discharge: 2013-08-08 | Disposition: A | Discharge status: Home / Self Care | Payer: Medicare Other | Source: Ambulatory Visit | Attending: Family Medicine | Admitting: Family Medicine

## 2013-08-08 ENCOUNTER — Other Ambulatory Visit: Payer: Self-pay | Admitting: Family Medicine

## 2013-08-08 DIAGNOSIS — E042 Nontoxic multinodular goiter: Secondary | ICD-10-CM

## 2013-08-08 DIAGNOSIS — E041 Nontoxic single thyroid nodule: Secondary | ICD-10-CM | POA: Insufficient documentation

## 2013-08-08 DIAGNOSIS — E049 Nontoxic goiter, unspecified: Secondary | ICD-10-CM

## 2013-08-08 NOTE — Assessment & Plan Note (Signed)
Controlled, no change in medication  

## 2013-08-08 NOTE — Assessment & Plan Note (Signed)
Improved and controlled on current regime , treated by neurology

## 2013-08-08 NOTE — Assessment & Plan Note (Signed)
Needs to re establish with psychiatry. Followed by [facility]. Not suicidal or homicidal

## 2013-08-08 NOTE — Assessment & Plan Note (Signed)
rept imaging study needed to assess size and presence of any nodules

## 2013-08-08 NOTE — Assessment & Plan Note (Signed)
Improved and controlled on current regime Managed by neurology

## 2013-08-08 NOTE — Progress Notes (Signed)
   Subjective:    Patient ID: Martha Montgomery, female    DOB: 1980/08/27, 33 y.o.   MRN: [id number]  HPI Pt in with main stated concern about her "lump in the middle of her neck" which changes in size and is at times tender. Has had concerns re thyroid structure and function for years. Also has her phone with pictures she was taking of rash over the area of concern, difficult to verify presence of a rash from pics on phone ands she states no rash present on day of visit. Anxiety and stress continue, son is mentally/emotionally unhealthy, dx of autism is being looked into, behavioral issues are major. C/o challenges with her marriage and also her relationship with her mother has deteriorated further in past year, also concerned about her grandmother's poor health Not suicidal or homicidal, but overwhelmed and stressed. Poor appetite with weight loss    Review of Systems See HPI Denies recent fever or chills. Denies sinus pressure, nasal congestion, ear pain or sore throat. Denies chest congestion, productive cough or wheezing. Denies chest pains, palpitations and leg swelling .   Denies dysuria, frequency, hesitancy or incontinence. Denies joint pain, swelling and limitation in mobility. Denies uncontrolled  headaches, seizures, numbness, or tingling.        Objective:   Physical Exam  Patient alert and oriented and in no cardiopulmonary distress.Anxious and at times tearful  HEENT: No facial asymmetry, EOMI, no sinus tenderness,  oropharynx pink and moist.  Neck adequate ROM, goiter, non tender, no palpable nodule,no JVD, no adenopathy.  Chest: Clear to auscultation bilaterally.  CVS: S1, S2 no murmurs, no S3.  ABD: Soft non tender. Bowel sounds normal.  Ext: No edema  MS: Adequate ROM spine, shoulders, hips and knees.  Skin: Intact, no ulcerations or rash noted.  Psych: Good eye contact, normal affect. Memory intact   CNS: CN 2-12 intact, power, tone and sensation normal  throughout.       Assessment & Plan:

## 2013-08-29 ENCOUNTER — Encounter: Payer: Medicare Other | Admitting: Family Medicine

## 2013-09-08 ENCOUNTER — Ambulatory Visit (INDEPENDENT_AMBULATORY_CARE_PROVIDER_SITE_OTHER): Payer: Medicare Other | Admitting: Otolaryngology

## 2013-09-08 DIAGNOSIS — D449 Neoplasm of uncertain behavior of unspecified endocrine gland: Secondary | ICD-10-CM | POA: Diagnosis not present

## 2013-09-08 DIAGNOSIS — R1312 Dysphagia, oropharyngeal phase: Secondary | ICD-10-CM | POA: Diagnosis not present

## 2013-09-12 ENCOUNTER — Other Ambulatory Visit: Payer: Self-pay | Admitting: Otolaryngology

## 2013-09-14 ENCOUNTER — Encounter (HOSPITAL_COMMUNITY): Payer: Self-pay | Admitting: Pharmacy Technician

## 2013-09-19 ENCOUNTER — Other Ambulatory Visit: Payer: Self-pay | Admitting: Otolaryngology

## 2013-09-21 DIAGNOSIS — Z79899 Other long term (current) drug therapy: Secondary | ICD-10-CM | POA: Diagnosis not present

## 2013-09-21 DIAGNOSIS — G894 Chronic pain syndrome: Secondary | ICD-10-CM | POA: Diagnosis not present

## 2013-09-21 DIAGNOSIS — M175 Other unilateral secondary osteoarthritis of knee: Secondary | ICD-10-CM | POA: Diagnosis not present

## 2013-09-21 DIAGNOSIS — R51 Headache: Secondary | ICD-10-CM | POA: Diagnosis not present

## 2013-09-22 ENCOUNTER — Encounter (HOSPITAL_COMMUNITY): Payer: Self-pay

## 2013-09-22 ENCOUNTER — Encounter (HOSPITAL_COMMUNITY)
Admission: RE | Admit: 2013-09-22 | Discharge: 2013-09-22 | Disposition: A | Discharge status: Home / Self Care | Payer: Medicare Other | Source: Ambulatory Visit | Attending: Anesthesiology | Admitting: Anesthesiology

## 2013-09-22 ENCOUNTER — Encounter (HOSPITAL_COMMUNITY)
Admission: RE | Admit: 2013-09-22 | Discharge: 2013-09-22 | Disposition: A | Discharge status: Home / Self Care | Payer: Medicare Other | Source: Ambulatory Visit | Attending: Otolaryngology | Admitting: Otolaryngology

## 2013-09-22 DIAGNOSIS — Z01818 Encounter for other preprocedural examination: Secondary | ICD-10-CM | POA: Insufficient documentation

## 2013-09-22 DIAGNOSIS — Z01810 Encounter for preprocedural cardiovascular examination: Secondary | ICD-10-CM | POA: Diagnosis not present

## 2013-09-22 DIAGNOSIS — Z01812 Encounter for preprocedural laboratory examination: Secondary | ICD-10-CM | POA: Insufficient documentation

## 2013-09-22 DIAGNOSIS — I10 Essential (primary) hypertension: Secondary | ICD-10-CM | POA: Diagnosis not present

## 2013-09-22 HISTORY — DX: Gastro-esophageal reflux disease without esophagitis: K21.9

## 2013-09-22 HISTORY — DX: Unspecified osteoarthritis, unspecified site: M19.90

## 2013-09-22 HISTORY — DX: Hypothyroidism, unspecified: E03.9

## 2013-09-22 LAB — CBC
HEMATOCRIT: 40.2 % (ref 36.0–46.0)
HEMOGLOBIN: 14.2 g/dL (ref 12.0–15.0)
MCH: 32 pg (ref 26.0–34.0)
MCHC: 35.3 g/dL (ref 30.0–36.0)
MCV: 90.5 fL (ref 78.0–100.0)
Platelets: 449 10*3/uL — ABNORMAL HIGH (ref 150–400)
RBC: 4.44 MIL/uL (ref 3.87–5.11)
RDW: 12.8 % (ref 11.5–15.5)
WBC: 5.4 10*3/uL (ref 4.0–10.5)

## 2013-09-22 LAB — BASIC METABOLIC PANEL
BUN: 8 mg/dL (ref 6–23)
CHLORIDE: 105 meq/L (ref 96–112)
CO2: 19 meq/L (ref 19–32)
CREATININE: 0.71 mg/dL (ref 0.50–1.10)
Calcium: 9.3 mg/dL (ref 8.4–10.5)
GFR calc Af Amer: >90 mL/min (ref >90)
GFR calc non Af Amer: >90 mL/min (ref >90)
GLUCOSE: 88 mg/dL (ref 70–99)
POTASSIUM: 4 meq/L (ref 3.7–5.3)
Sodium: 138 mEq/L (ref 137–147)

## 2013-09-22 LAB — HCG, SERUM, QUALITATIVE: Preg, Serum: NEGATIVE

## 2013-09-22 NOTE — Pre-Procedure Instructions (Signed)
[name]  09/22/2013   Your procedure is scheduled on:  March 4   Report to [facility]  2 * 3 at [place] AM.  Call this number if you have problems the morning of surgery: [phone]   Remember:   Do not eat food or drink liquids after midnight.   Take these medicines the morning of surgery with A SIP OF WATER: Bupropion (wellbutrin XL), Metoprolol (Lopressor), Prilosec (omeprazole) if needed, Tramadol (Ultram) if needed, topiramate (Topamax)   Stop taking Aspirin, Aleve, Ibuprofen, BC's, Goody's, Herbal medications, and Fish Oil  Do not wear jewelry, make-up or nail polish.  Do not wear lotions, powders, or perfumes. You may wear deodorant.  Do not shave 48 hours prior to surgery. Men may shave face and neck.  Do not bring valuables to the hospital.  [facility] is not responsible                  for any belongings or valuables.               Contacts, dentures or bridgework may not be worn into surgery.  Leave suitcase in the car. After surgery it may be brought to your room.  For patients admitted to the hospital, discharge time is determined by your                treatment team.               Patients discharged the day of surgery will not be allowed to drive  home.    Special Instructions: N/A   Please read over the following fact sheets that you were given: Pain Booklet, Coughing and Deep Breathing and Surgical Site Infection Prevention

## 2013-09-22 NOTE — Progress Notes (Signed)
PCP is Dr Moshe Cipro Denies seeing a cardiologist Pt states that she has had a sleep study in the past at [facility]. Pt states that she will bring a copy on the Day of surgery. Denies having a recent CXR or EKG.

## 2013-09-27 HISTORY — PX: TOTAL THYROIDECTOMY: SHX2547

## 2013-09-28 ENCOUNTER — Encounter (HOSPITAL_COMMUNITY): Payer: Medicare Other | Admitting: Anesthesiology

## 2013-09-28 ENCOUNTER — Ambulatory Visit (HOSPITAL_COMMUNITY)
Admission: RE | Admit: 2013-09-28 | Discharge: 2013-09-30 | Disposition: A | Discharge status: Home / Self Care | Payer: Medicare Other | Source: Ambulatory Visit | Attending: Otolaryngology | Admitting: Otolaryngology

## 2013-09-28 ENCOUNTER — Encounter (HOSPITAL_COMMUNITY)
Admission: RE | Disposition: A | Discharge status: Home / Self Care | Payer: Self-pay | Source: Ambulatory Visit | Attending: Otolaryngology

## 2013-09-28 ENCOUNTER — Ambulatory Visit (HOSPITAL_COMMUNITY): Payer: Medicare Other | Admitting: Anesthesiology

## 2013-09-28 ENCOUNTER — Encounter (HOSPITAL_COMMUNITY): Payer: Self-pay | Admitting: *Deleted

## 2013-09-28 DIAGNOSIS — E042 Nontoxic multinodular goiter: Secondary | ICD-10-CM | POA: Insufficient documentation

## 2013-09-28 DIAGNOSIS — E039 Hypothyroidism, unspecified: Secondary | ICD-10-CM | POA: Diagnosis not present

## 2013-09-28 DIAGNOSIS — K219 Gastro-esophageal reflux disease without esophagitis: Secondary | ICD-10-CM | POA: Diagnosis not present

## 2013-09-28 DIAGNOSIS — F411 Generalized anxiety disorder: Secondary | ICD-10-CM | POA: Insufficient documentation

## 2013-09-28 DIAGNOSIS — I10 Essential (primary) hypertension: Secondary | ICD-10-CM | POA: Diagnosis not present

## 2013-09-28 DIAGNOSIS — Z9089 Acquired absence of other organs: Secondary | ICD-10-CM

## 2013-09-28 DIAGNOSIS — Z87891 Personal history of nicotine dependence: Secondary | ICD-10-CM | POA: Insufficient documentation

## 2013-09-28 DIAGNOSIS — R51 Headache: Secondary | ICD-10-CM | POA: Diagnosis not present

## 2013-09-28 DIAGNOSIS — E890 Postprocedural hypothyroidism: Secondary | ICD-10-CM

## 2013-09-28 DIAGNOSIS — D449 Neoplasm of uncertain behavior of unspecified endocrine gland: Secondary | ICD-10-CM | POA: Diagnosis not present

## 2013-09-28 DIAGNOSIS — Z9889 Other specified postprocedural states: Secondary | ICD-10-CM

## 2013-09-28 DIAGNOSIS — R1314 Dysphagia, pharyngoesophageal phase: Secondary | ICD-10-CM | POA: Diagnosis not present

## 2013-09-28 HISTORY — PX: THYROIDECTOMY: SHX17

## 2013-09-28 HISTORY — DX: Anxiety disorder, unspecified: F41.9

## 2013-09-28 HISTORY — DX: Gout, unspecified: M10.9

## 2013-09-28 LAB — CALCIUM
CALCIUM: 9.1 mg/dL (ref 8.4–10.5)
Calcium: 8.7 mg/dL (ref 8.4–10.5)

## 2013-09-28 SURGERY — THYROIDECTOMY
Anesthesia: General | Site: Neck

## 2013-09-28 MED ORDER — BUPROPION HCL ER (XL) 300 MG PO TB24
300.0000 mg | ORAL_TABLET | Freq: Every day | ORAL | Status: DC
  Administered 2013-09-28 – 2013-09-30 (×3): 300 mg via ORAL
  Filled 2013-09-28 (×3): qty 1

## 2013-09-28 MED ORDER — METOPROLOL TARTRATE 50 MG PO TABS
50.0000 mg | ORAL_TABLET | Freq: Once | ORAL | Status: AC
  Administered 2013-09-28: 50 mg via ORAL
  Filled 2013-09-28: qty 1

## 2013-09-28 MED ORDER — KCL IN DEXTROSE-NACL 20-5-0.45 MEQ/L-%-% IV SOLN
INTRAVENOUS | Status: AC
  Filled 2013-09-28: qty 1000

## 2013-09-28 MED ORDER — OXYMETAZOLINE HCL 0.05 % NA SOLN
2.0000 | Freq: Every day | NASAL | Status: DC | PRN
  Filled 2013-09-28: qty 15

## 2013-09-28 MED ORDER — HYDROCODONE-ACETAMINOPHEN 5-325 MG PO TABS
1.0000 | ORAL_TABLET | ORAL | Status: DC | PRN
  Administered 2013-09-28 – 2013-09-30 (×5): 2 via ORAL
  Filled 2013-09-28 (×5): qty 2

## 2013-09-28 MED ORDER — SODIUM CHLORIDE 0.9 % IJ SOLN
INTRAMUSCULAR | Status: AC
  Filled 2013-09-28: qty 10

## 2013-09-28 MED ORDER — ACETAMINOPHEN 160 MG/5ML PO SOLN
325.0000 mg | Freq: Once | ORAL | Status: AC
  Administered 2013-09-28: 320 mg via ORAL

## 2013-09-28 MED ORDER — GLYCOPYRROLATE 0.2 MG/ML IJ SOLN
INTRAMUSCULAR | Status: AC
  Filled 2013-09-28: qty 4

## 2013-09-28 MED ORDER — PANTOPRAZOLE SODIUM 40 MG PO TBEC: 40.0000 mg | DELAYED_RELEASE_TABLET | Freq: Every day | ORAL | Status: DC | PRN

## 2013-09-28 MED ORDER — ONDANSETRON HCL 4 MG/2ML IJ SOLN
INTRAMUSCULAR | Status: AC
  Filled 2013-09-28: qty 2

## 2013-09-28 MED ORDER — OXYCODONE HCL 5 MG/5ML PO SOLN
ORAL | Status: AC
  Administered 2013-09-28: 5 mg via ORAL
  Filled 2013-09-28: qty 5

## 2013-09-28 MED ORDER — FENTANYL CITRATE 0.05 MG/ML IJ SOLN
INTRAMUSCULAR | Status: AC
  Administered 2013-09-28: 50 ug via INTRAVENOUS
  Filled 2013-09-28: qty 2

## 2013-09-28 MED ORDER — LIDOCAINE-EPINEPHRINE 1 %-1:100000 IJ SOLN
INTRAMUSCULAR | Status: AC
  Filled 2013-09-28: qty 1

## 2013-09-28 MED ORDER — LACTATED RINGERS IV SOLN
INTRAVENOUS | Status: DC | PRN
  Administered 2013-09-28 (×2): via INTRAVENOUS

## 2013-09-28 MED ORDER — ACETAMINOPHEN 160 MG/5ML PO SOLN
325.0000 mg | Freq: Once | ORAL | Status: AC
  Administered 2013-09-28: 325 mg via ORAL

## 2013-09-28 MED ORDER — LIDOCAINE-EPINEPHRINE 1 %-1:100000 IJ SOLN
INTRAMUSCULAR | Status: DC | PRN
  Administered 2013-09-28: 10 mL

## 2013-09-28 MED ORDER — OXYCODONE HCL 5 MG/5ML PO SOLN
5.0000 mg | Freq: Once | ORAL | Status: AC
  Administered 2013-09-28: 5 mg via ORAL

## 2013-09-28 MED ORDER — LIDOCAINE HCL (CARDIAC) 20 MG/ML IV SOLN
INTRAVENOUS | Status: DC | PRN
  Administered 2013-09-28: 50 mg via INTRAVENOUS

## 2013-09-28 MED ORDER — NEOSTIGMINE METHYLSULFATE 1 MG/ML IJ SOLN
INTRAMUSCULAR | Status: AC
  Filled 2013-09-28: qty 10

## 2013-09-28 MED ORDER — FENTANYL CITRATE 0.05 MG/ML IJ SOLN
INTRAMUSCULAR | Status: DC | PRN
  Administered 2013-09-28: 50 ug via INTRAVENOUS
  Administered 2013-09-28: 100 ug via INTRAVENOUS
  Administered 2013-09-28 (×2): 50 ug via INTRAVENOUS

## 2013-09-28 MED ORDER — EPHEDRINE SULFATE 50 MG/ML IJ SOLN
INTRAMUSCULAR | Status: AC
  Filled 2013-09-28: qty 1

## 2013-09-28 MED ORDER — ROCURONIUM BROMIDE 100 MG/10ML IV SOLN
INTRAVENOUS | Status: DC | PRN
  Administered 2013-09-28: 30 mg via INTRAVENOUS

## 2013-09-28 MED ORDER — SUCCINYLCHOLINE CHLORIDE 20 MG/ML IJ SOLN
INTRAMUSCULAR | Status: AC
  Filled 2013-09-28: qty 1

## 2013-09-28 MED ORDER — CEFAZOLIN SODIUM-DEXTROSE 2-3 GM-% IV SOLR
INTRAVENOUS | Status: AC
  Administered 2013-09-28: 2 g via INTRAVENOUS
  Filled 2013-09-28: qty 50

## 2013-09-28 MED ORDER — TOPIRAMATE 100 MG PO TABS
100.0000 mg | ORAL_TABLET | Freq: Every day | ORAL | Status: DC
  Administered 2013-09-28 – 2013-09-30 (×3): 100 mg via ORAL
  Filled 2013-09-28 (×3): qty 1

## 2013-09-28 MED ORDER — FENTANYL CITRATE 0.05 MG/ML IJ SOLN
25.0000 ug | INTRAMUSCULAR | Status: DC | PRN
  Administered 2013-09-28 (×3): 50 ug via INTRAVENOUS

## 2013-09-28 MED ORDER — MIDAZOLAM HCL 2 MG/2ML IJ SOLN
INTRAMUSCULAR | Status: AC
  Filled 2013-09-28: qty 2

## 2013-09-28 MED ORDER — DEXAMETHASONE SODIUM PHOSPHATE 10 MG/ML IJ SOLN
INTRAMUSCULAR | Status: AC
  Filled 2013-09-28: qty 1

## 2013-09-28 MED ORDER — GLYCOPYRROLATE 0.2 MG/ML IJ SOLN
INTRAMUSCULAR | Status: DC | PRN
  Administered 2013-09-28: 0.4 mg via INTRAVENOUS

## 2013-09-28 MED ORDER — FENTANYL CITRATE 0.05 MG/ML IJ SOLN
INTRAMUSCULAR | Status: AC
  Filled 2013-09-28: qty 5

## 2013-09-28 MED ORDER — METOPROLOL TARTRATE 50 MG PO TABS
50.0000 mg | ORAL_TABLET | Freq: Two times a day (BID) | ORAL | Status: DC
  Administered 2013-09-28 – 2013-09-30 (×3): 50 mg via ORAL
  Filled 2013-09-28 (×5): qty 1

## 2013-09-28 MED ORDER — CEFAZOLIN SODIUM 1-5 GM-% IV SOLN
1.0000 g | Freq: Three times a day (TID) | INTRAVENOUS | Status: AC
  Administered 2013-09-28 – 2013-09-29 (×4): 1 g via INTRAVENOUS
  Filled 2013-09-28 (×4): qty 50

## 2013-09-28 MED ORDER — MORPHINE SULFATE 2 MG/ML IJ SOLN
2.0000 mg | INTRAMUSCULAR | Status: DC | PRN
  Administered 2013-09-28: 2 mg via INTRAVENOUS
  Filled 2013-09-28: qty 1

## 2013-09-28 MED ORDER — LIDOCAINE HCL (CARDIAC) 20 MG/ML IV SOLN
INTRAVENOUS | Status: AC
  Filled 2013-09-28: qty 5

## 2013-09-28 MED ORDER — ACETAMINOPHEN 160 MG/5ML PO SUSP
ORAL | Status: AC
  Filled 2013-09-28: qty 10

## 2013-09-28 MED ORDER — 0.9 % SODIUM CHLORIDE (POUR BTL) OPTIME
TOPICAL | Status: DC | PRN
  Administered 2013-09-28: 1000 mL

## 2013-09-28 MED ORDER — PROPOFOL 10 MG/ML IV BOLUS
INTRAVENOUS | Status: DC | PRN
  Administered 2013-09-28: 150 mg via INTRAVENOUS

## 2013-09-28 MED ORDER — METOPROLOL TARTRATE 50 MG PO TABS
ORAL_TABLET | ORAL | Status: AC
  Filled 2013-09-28: qty 1

## 2013-09-28 MED ORDER — HYDROCHLOROTHIAZIDE 12.5 MG PO CAPS
12.5000 mg | ORAL_CAPSULE | Freq: Every day | ORAL | Status: DC
  Administered 2013-09-28 – 2013-09-30 (×3): 12.5 mg via ORAL
  Filled 2013-09-28 (×3): qty 1

## 2013-09-28 MED ORDER — NEOSTIGMINE METHYLSULFATE 1 MG/ML IJ SOLN
INTRAMUSCULAR | Status: DC | PRN
  Administered 2013-09-28: 2 mg via INTRAVENOUS

## 2013-09-28 MED ORDER — OXYCODONE HCL 5 MG/5ML PO SOLN
5.0000 mg | Freq: Once | ORAL | Status: AC
Start: 2013-09-28 — End: 2013-09-28
  Administered 2013-09-28: 5 mg via ORAL

## 2013-09-28 MED ORDER — ROCURONIUM BROMIDE 50 MG/5ML IV SOLN
INTRAVENOUS | Status: AC
  Filled 2013-09-28: qty 1

## 2013-09-28 MED ORDER — PROPOFOL 10 MG/ML IV BOLUS
INTRAVENOUS | Status: AC
  Filled 2013-09-28: qty 20

## 2013-09-28 MED ORDER — OXYCODONE-ACETAMINOPHEN 5-325 MG PO TABS
1.0000 | ORAL_TABLET | ORAL | Status: DC | PRN
  Filled 2013-09-28: qty 2

## 2013-09-28 MED ORDER — MIDAZOLAM HCL 5 MG/5ML IJ SOLN
INTRAMUSCULAR | Status: DC | PRN
  Administered 2013-09-28: 2 mg via INTRAVENOUS

## 2013-09-28 MED ORDER — ARTIFICIAL TEARS OP OINT
TOPICAL_OINTMENT | OPHTHALMIC | Status: AC
  Filled 2013-09-28: qty 3.5

## 2013-09-28 MED ORDER — ONDANSETRON HCL 4 MG/2ML IJ SOLN
INTRAMUSCULAR | Status: DC | PRN
Start: 2013-09-28 — End: 2013-09-28
  Administered 2013-09-28: 4 mg via INTRAVENOUS

## 2013-09-28 MED ORDER — PROMETHAZINE HCL 25 MG PO TABS: 25.0000 mg | ORAL_TABLET | Freq: Four times a day (QID) | ORAL | Status: DC | PRN

## 2013-09-28 MED ORDER — ARTIFICIAL TEARS OP OINT
TOPICAL_OINTMENT | OPHTHALMIC | Status: DC | PRN
  Administered 2013-09-28: 1 via OPHTHALMIC

## 2013-09-28 MED ORDER — ACETAMINOPHEN 160 MG/5ML PO SUSP
ORAL | Status: AC
  Administered 2013-09-28: 320 mg via ORAL
  Filled 2013-09-28: qty 10

## 2013-09-28 MED ORDER — OXYCODONE HCL 5 MG/5ML PO SOLN
ORAL | Status: AC
  Filled 2013-09-28: qty 5

## 2013-09-28 MED ORDER — DEXAMETHASONE SODIUM PHOSPHATE 10 MG/ML IJ SOLN
INTRAMUSCULAR | Status: DC | PRN
  Administered 2013-09-28: 10 mg via INTRAVENOUS

## 2013-09-28 MED ORDER — PROMETHAZINE HCL 25 MG RE SUPP: 25.0000 mg | Freq: Four times a day (QID) | RECTAL | Status: DC | PRN

## 2013-09-28 MED ORDER — KCL IN DEXTROSE-NACL 20-5-0.45 MEQ/L-%-% IV SOLN
INTRAVENOUS | Status: DC
  Administered 2013-09-28: 13:00:00 via INTRAVENOUS
  Filled 2013-09-28 (×3): qty 1000

## 2013-09-28 SURGICAL SUPPLY — 47 items
ADH SKN CLS APL DERMABOND .7 (GAUZE/BANDAGES/DRESSINGS) ×1
ATTRACTOMAT 16X20 MAGNETIC DRP (DRAPES) ×3 IMPLANT
BLADE SURG CLIPPER 3M 9600 (MISCELLANEOUS) IMPLANT
CANISTER SUCTION 2500CC (MISCELLANEOUS) ×3 IMPLANT
CLEANER TIP ELECTROSURG 2X2 (MISCELLANEOUS) ×3 IMPLANT
CLIP TI WIDE RED SMALL 24 (CLIP) ×2 IMPLANT
CONT SPEC 4OZ CLIKSEAL STRL BL (MISCELLANEOUS) ×2 IMPLANT
CORDS BIPOLAR (ELECTRODE) ×3 IMPLANT
COVER SURGICAL LIGHT HANDLE (MISCELLANEOUS) ×3 IMPLANT
CRADLE DONUT ADULT HEAD (MISCELLANEOUS) ×3 IMPLANT
DERMABOND ADVANCED (GAUZE/BANDAGES/DRESSINGS) ×2
DERMABOND ADVANCED .7 DNX12 (GAUZE/BANDAGES/DRESSINGS) IMPLANT
DRAIN CHANNEL 10F 3/8 F FF (DRAIN) ×3 IMPLANT
ELECT COATED BLADE 2.86 ST (ELECTRODE) ×3 IMPLANT
ELECT REM PT RETURN 9FT ADLT (ELECTROSURGICAL) ×3
ELECTRODE REM PT RTRN 9FT ADLT (ELECTROSURGICAL) ×1 IMPLANT
EVACUATOR SILICONE 100CC (DRAIN) ×3 IMPLANT
GAUZE SPONGE 4X4 16PLY XRAY LF (GAUZE/BANDAGES/DRESSINGS) ×4 IMPLANT
GLOVE BIO SURGEON STRL SZ 6.5 (GLOVE) ×2 IMPLANT
GLOVE BIO SURGEONS STRL SZ 6.5 (GLOVE) ×1
GLOVE BIOGEL PI IND STRL 6.5 (GLOVE) ×1 IMPLANT
GLOVE BIOGEL PI IND STRL 7.0 (GLOVE) IMPLANT
GLOVE BIOGEL PI INDICATOR 6.5 (GLOVE) ×2
GLOVE BIOGEL PI INDICATOR 7.0 (GLOVE) ×2
GLOVE ECLIPSE 7.5 STRL STRAW (GLOVE) ×3 IMPLANT
GLOVE SURG SS PI 6.5 STRL IVOR (GLOVE) ×2 IMPLANT
GOWN STRL NON-REIN LRG LVL3 (GOWN DISPOSABLE) ×9 IMPLANT
HEMOSTAT SURGICEL .5X2 ABSORB (HEMOSTASIS) IMPLANT
KIT BASIN OR (CUSTOM PROCEDURE TRAY) ×3 IMPLANT
KIT ROOM TURNOVER OR (KITS) ×3 IMPLANT
LOCATOR NERVE 3 VOLT (DISPOSABLE) ×3 IMPLANT
NS IRRIG 1000ML POUR BTL (IV SOLUTION) ×3 IMPLANT
PAD ARMBOARD 7.5X6 YLW CONV (MISCELLANEOUS) ×3 IMPLANT
PENCIL BUTTON HOLSTER BLD 10FT (ELECTRODE) ×3 IMPLANT
PIN SAFETY STERILE (MISCELLANEOUS) ×2 IMPLANT
SHEARS HARMONIC 9CM CVD (BLADE) ×3 IMPLANT
SPONGE INTESTINAL PEANUT (DISPOSABLE) ×3 IMPLANT
SUT ETHILON 2 0 FS 18 (SUTURE) ×3 IMPLANT
SUT SILK 2 0 FS (SUTURE) ×3 IMPLANT
SUT SILK 3 0 REEL (SUTURE) ×3 IMPLANT
SUT VICRYL 4-0 PS2 18IN ABS (SUTURE) ×5 IMPLANT
TOWEL OR 17X24 6PK STRL BLUE (TOWEL DISPOSABLE) ×2 IMPLANT
TOWEL OR 17X26 10 PK STRL BLUE (TOWEL DISPOSABLE) ×3 IMPLANT
TRAY ENT MC OR (CUSTOM PROCEDURE TRAY) ×3 IMPLANT
TRAY FOLEY CATH 14FRSI W/METER (CATHETERS) IMPLANT
TUBE CONNECTING 12'X1/4 (SUCTIONS) ×1
TUBE CONNECTING 12X1/4 (SUCTIONS) ×1 IMPLANT

## 2013-09-28 NOTE — Anesthesia Procedure Notes (Signed)
Procedure Name: Intubation Date/Time: 09/28/2013 8:34 AM Performed by: Neldon Newport Pre-anesthesia Checklist: Patient identified, Timeout performed, Emergency Drugs available, Suction available and Patient being monitored Patient Re-evaluated:Patient Re-evaluated prior to inductionOxygen Delivery Method: Circle system utilized Preoxygenation: Pre-oxygenation with 100% oxygen Intubation Type: IV induction Ventilation: Mask ventilation without difficulty Grade View: Grade I Tube type: Oral Tube size: 7.0 mm Number of attempts: 1 Airway Equipment and Method: Video-laryngoscopy Placement Confirmation: positive ETCO2,  ETT inserted through vocal cords under direct vision and breath sounds checked- equal and bilateral Secured at: 22 cm Tube secured with: Tape Dental Injury: Teeth and Oropharynx as per pre-operative assessment

## 2013-09-28 NOTE — Anesthesia Preprocedure Evaluation (Addendum)
Anesthesia Evaluation  Patient identified by MRN, date of birth, ID band Patient awake    Reviewed: Allergy & Precautions, H&P , NPO status , Patient's Chart, lab work & pertinent test results  History of Anesthesia Complications (+) PONV  Airway Mallampati: I TM Distance: >3 FB     Dental  (+) Teeth Intact, Dental Advidsory Given   Pulmonary former smoker,  breath sounds clear to auscultation        Cardiovascular hypertension, Rhythm:regular     Neuro/Psych  Headaches, Anxiety    GI/Hepatic Neg liver ROS, GERD-  ,  Endo/Other  Hypothyroidism   Renal/GU      Musculoskeletal   Abdominal   Peds  Hematology   Anesthesia Other Findings Braces on teeth.  Dental advisory thoroughly explained by Dr. Oletta Lamas.  Reproductive/Obstetrics                       Anesthesia Physical Anesthesia Plan  ASA: III  Anesthesia Plan: General   Post-op Pain Management:    Induction: Intravenous  Airway Management Planned: Oral ETT  Additional Equipment:   Intra-op Plan:   Post-operative Plan: Possible Post-op intubation/ventilation  Informed Consent:   Dental advisory given and Dental Advisory Given  Plan Discussed with: CRNA, Anesthesiologist and Surgeon  Anesthesia Plan Comments:       Anesthesia Quick Evaluation

## 2013-09-28 NOTE — Brief Op Note (Signed)
09/28/2013  10:57 AM  PATIENT:  [name]  33 y.o. female  PRE-OPERATIVE DIAGNOSIS:  Multinodular goiter  POST-OPERATIVE DIAGNOSIS:  Multinodular goiter  PROCEDURE:  Procedure(s): TOTAL THYROIDECTOMY (N/A)  SURGEON:  Surgeon(s) and Role:    * Ascencion Dike, MD - Primary  PHYSICIAN ASSISTANT:   ASSISTANTS: Rometta Emery, PA-C   ANESTHESIA:   general  EBL:  Total I/O In: 1400 [I.V.:1400] Out: 100 [Blood:100]  BLOOD ADMINISTERED:none  DRAINS: (#10) Jackson-Pratt drain(s) with closed bulb suction in the neck   LOCAL MEDICATIONS USED:  LIDOCAINE   SPECIMEN:  Source of Specimen:  Total thyroid  DISPOSITION OF SPECIMEN:  PATHOLOGY  COUNTS:  YES  TOURNIQUET:  * No tourniquets in log *  DICTATION: .Other Dictation: Dictation Number [id number]  PLAN OF CARE: Discharge to home after PACU  PATIENT DISPOSITION:  PACU - hemodynamically stable.   Delay start of Pharmacological VTE agent (>24hrs) due to surgical blood loss or risk of bleeding: not applicable

## 2013-09-28 NOTE — H&P (Signed)
  H&P Update  Pt's original H&P dated 09/08/13 reviewed and placed in chart (to be scanned).  I personally examined the patient today.  No change in health. Proceed with total thyroidectomy.

## 2013-09-28 NOTE — Transfer of Care (Signed)
Immediate Anesthesia Transfer of Care Note  Patient: Martha Montgomery  Procedure(s) Performed: Procedure(s): TOTAL THYROIDECTOMY (N/A)  Patient Location: PACU  Anesthesia Type:General  Level of Consciousness: awake, alert  and oriented  Airway & Oxygen Therapy: Patient Spontanous Breathing and Patient connected to nasal cannula oxygen  Post-op Assessment: Report given to PACU RN, Post -op Vital signs reviewed and stable and Patient moving all extremities X 4  Post vital signs: Reviewed and stable  Complications: No apparent anesthesia complications

## 2013-09-28 NOTE — Anesthesia Postprocedure Evaluation (Signed)
  Anesthesia Post-op Note  Patient: Martha Montgomery  Procedure(s) Performed: Procedure(s): TOTAL THYROIDECTOMY (N/A)  Patient Location: PACU  Anesthesia Type:General  Level of Consciousness: awake  Airway and Oxygen Therapy: Patient Spontanous Breathing  Post-op Pain: mild  Post-op Assessment: Post-op Vital signs reviewed  Post-op Vital Signs: Reviewed  Complications: No apparent anesthesia complications 

## 2013-09-28 NOTE — Preoperative (Signed)
Beta Blockers   Reason not to administer Beta Blockers:Not Applicable 

## 2013-09-28 NOTE — Anesthesia Postprocedure Evaluation (Signed)
  Anesthesia Post-op Note  Patient: Martha Montgomery  Procedure(s) Performed: Procedure(s): TOTAL THYROIDECTOMY (N/A)  Patient Location: PACU  Anesthesia Type:General  Level of Consciousness: awake  Airway and Oxygen Therapy: Patient Spontanous Breathing  Post-op Pain: mild  Post-op Assessment: Post-op Vital signs reviewed  Post-op Vital Signs: Reviewed  Complications: No apparent anesthesia complications

## 2013-09-29 ENCOUNTER — Encounter (HOSPITAL_COMMUNITY): Payer: Self-pay | Admitting: Otolaryngology

## 2013-09-29 LAB — CALCIUM
CALCIUM: 8.3 mg/dL — AB (ref 8.4–10.5)
CALCIUM: 8.7 mg/dL (ref 8.4–10.5)
Calcium: 9.1 mg/dL (ref 8.4–10.5)

## 2013-09-29 MED ORDER — CALCIUM CARBONATE 1250 (500 CA) MG PO TABS
2.0000 | ORAL_TABLET | Freq: Two times a day (BID) | ORAL | Status: DC
  Administered 2013-09-29 – 2013-09-30 (×3): 1000 mg via ORAL
  Filled 2013-09-29 (×5): qty 2

## 2013-09-29 NOTE — Progress Notes (Signed)
Subjective: No issues overnight.  Tolerated po.  Objective: Vital signs in last 24 hours: Temp:  [98 F (36.7 C)-99 F (37.2 C)] 99 F (37.2 C) (03/05 0642) Pulse Rate:  [71-87] 81 (03/05 0642) Resp:  [10-20] 20 (03/05 0642) BP: (110-131)/(56-90) 114/73 mmHg (03/05 0642) SpO2:  [97 %-100 %] 100 % (03/05 0642) Weight:  [140 lb 10.5 oz (63.8 kg)] 140 lb 10.5 oz (63.8 kg) (03/04 1500)  Incision: C/D/I JP with serosanguinous drainage. No significant edema or erythema  No results found for this basename: WBC, HGB, HCT, PLT,  in the last 72 hours  Recent Labs  09/28/13 2122 09/29/13 0535  CALCIUM 8.7 8.3*    Medications:  I have reviewed the patient's current medications. Scheduled: . buPROPion  300 mg Oral Daily  . calcium carbonate  2 tablet Oral BID WC  .  ceFAZolin (ANCEF) IV  1 g Intravenous Q8H  . hydrochlorothiazide  12.5 mg Oral Daily  . metoprolol  50 mg Oral BID  . topiramate  100 mg Oral Daily   PXT:GGYIRSWNIOE-[id number], morphine injection, oxymetazoline, pantoprazole, promethazine, promethazine  Assessment/Plan: POD #1 s/p total thyroidectomy.  Her Ca level is slightly decreasing.  Will start Os-cal.  Heplock IV.  Anticipate d/c home tomorrow.   LOS: 1 day   Zavannah Deblois,SUI W 09/29/2013, 8:14 AM

## 2013-09-29 NOTE — Progress Notes (Signed)
Nutrition Brief Note  Reason for Assessment: MST  33 y.o. female  Admitting Dx: S/p Thyroidectomy from multinodular goiter  PROCEDURE: Procedure(s): (3/4) TOTAL THYROIDECTOMY (N/A)  Pt with meal completion of 90-100%. Pt reports her appetite after surgery has improved. Pt reports fluctuating weight loss and gain along with common decreased appetite since 2008 when her thyroids started to give her a problem. Pt reports her usual body weight of 170 lbs in 2008. Pt reports eating good at home with 4 meals a day and snacks in between to try to gain back her weight. Pt reports she is constantly eating and continues to lose weight. Pt reports problem is due to her thyroid problem. Pt reports she suspects her appetite and weight gain will improve when she gets discharged. Pt was educated to encourage PO intake or oral supplement intake when/if she has a decreased appetite after discharge at home. Pt to anticipate d/c home tomorrow per MD note.   Pt with no significant finding of fat or muscle mass loss.   No nutrition intervention at this time. If nutrition issues arise, please consult RD.  Height: Ht Readings from Last 1 Encounters:  09/28/13 5\' 3"  (1.6 m)    Weight: Wt Readings from Last 1 Encounters:  09/28/13 140 lb 10.5 oz (63.8 kg)    Wt Readings from Last 10 Encounters:  09/28/13 140 lb 10.5 oz (63.8 kg)  09/28/13 140 lb 10.5 oz (63.8 kg)  09/22/13 140 lb 10.5 oz (63.8 kg)  08/05/13 136 lb 12.8 oz (62.052 kg)  03/17/13 139 lb (63.05 kg)  03/14/13 135 lb 8 oz (61.462 kg)  02/24/13 137 lb (62.143 kg)  02/04/13 128 lb (58.06 kg)  11/04/12 141 lb (63.957 kg)  08/04/12 136 lb (61.689 kg)    BMI:  Body mass index is 24.92 kg/(m^2).  Kallie Locks Dietetic Intern Pager: [phone]

## 2013-09-29 NOTE — Op Note (Signed)
NAMEJAKYAH, Martha [name]:  [id number]  MEDICAL RECORD NO.:  [id number]  LOCATION:  6N04C                        FACILITY:  [facility]  PHYSICIAN:  Leta Baptist, MD            DATE OF BIRTH:  01-23-1981  DATE OF PROCEDURE:  09/28/2013 DATE OF DISCHARGE:                              OPERATIVE REPORT   SURGEON:  Leta Baptist, MD.  ASSISTANT:  Rometta Emery, P.A.  PREOPERATIVE DIAGNOSIS:  Multinodular goiter, causing compressive symptoms.  POSTOPERATIVE DIAGNOSIS:  Multinodular goiter, causing compressive symptoms.  PROCEDURE PERFORMED:  Total thyroidectomy.  ANESTHESIA:  General endotracheal tube anesthesia.  COMPLICATIONS:  None.  ESTIMATED BLOOD LOSS:  Less than 50 mL.  INDICATION FOR PROCEDURE:  The patient is a 33 year old female with a history of multinodular goiter.  As a result of her large goiter, the patient has been complaining of compressive symptoms.  She complains of persistent globus sensation and dysphagia.  On her ultrasound examination, she was noted to have numerous nodules, too numerous to count.  Based on the above findings, the patient would like to have her thyroid gland removed.  The risks, benefits, alternatives, and details of the procedure were discussed with the patient.  Questions were invited and answered.  Informed consent was obtained.  DESCRIPTION:  The patient was taken to the operating room and placed supine on the operating table.  General endotracheal tube anesthesia was administered by the anesthesiologist.  Preop IV antibiotics was given. The patient was positioned and prepped and draped in a standard fashion for thyroidectomy surgery.  1% lidocaine with 1:100, 000 epinephrine was injected at the planned site of incision.  Lower neck transverse incision was made.  The incision was carried down to the level of the platysma muscles. Superior and inferiorly-based subplatysmal flaps were elevated in a standard fashion.  The  strap muscles were divided at midline and retracted laterally.  The thyroid gland was identified.  Numerous thyroid nodules were noted within the thyroid gland bilaterally. Attention was first focused on the right side.  The right thyroid lobe was carefully dissected free from the surrounding soft tissue.  The middle thyroid vein and the superior thyroid vessels were ligated.  The recurrent laryngeal nerve was identified and preserved.  Possible parathyroid glands were also identified and preserved.  The entire right thyroid lobe was then dissected free from its underlying attachments. The same procedure was then repeated on the contralateral side.  The left recurrent laryngeal nerve was also identified and preserved.  The entire gland was removed and sent to the Pathology Department for permanent histologic identification.  The surgical sites were copiously irrigated.  Hemostasis was achieved with bipolar electrocautery.  A #10 JP drain was placed.  The strap muscles were closed with running 4-0 Vicryl sutures.  The incision was then closed in layers with 4-0 Vicryl and Dermabond.  The care of the patient was turned over to the anesthesiologist.  The patient was awakened from anesthesia without difficulty.  She was extubated and transferred to the recovery room in good condition.  OPERATIVE FINDINGS:  Multinodular thyroid goiter.  SPECIMEN:  Total thyroid specimen.  FOLLOWUP CARE:  The patient will be observed overnight in the hospital. Once her calcium level is stabilized, she will be discharged home.     Leta Baptist, MD     ST/MEDQ  D:  09/28/2013  T:  09/29/2013  Job:  [id number]  cc:   Norwood Levo. Moshe Cipro, M.D.

## 2013-09-29 NOTE — Progress Notes (Signed)
Agree with intern assessment. No nutrition interventions needed at this time.  Pryor Ochoa RD, LDN Inpatient Clinical Dietitian Pager: [phone] After Hours Pager: [phone]

## 2013-09-30 LAB — CALCIUM: Calcium: 9 mg/dL (ref 8.4–10.5)

## 2013-09-30 MED ORDER — CALCIUM CARBONATE 1250 (500 CA) MG PO TABS: 1.0000 | ORAL_TABLET | Freq: Two times a day (BID) | ORAL | Status: DC

## 2013-09-30 MED ORDER — LEVOTHYROXINE SODIUM 75 MCG PO TABS: 75.0000 ug | ORAL_TABLET | Freq: Every day | ORAL | Status: DC

## 2013-09-30 MED ORDER — HYDROCODONE-ACETAMINOPHEN 5-325 MG PO TABS: 1.0000 | ORAL_TABLET | ORAL | Status: DC | PRN

## 2013-09-30 NOTE — Discharge Instructions (Signed)
Thyroidectomy Care After Refer to this sheet in the next few weeks. These instructions provide you with general information on caring for yourself after you leave the hospital. Your caregiver also may give you specific instructions. Your treatment has been planned according to the most current medical practices available, but problems sometimes occur. Call your caregiver if you have any problems or questions after your procedure. HOME CARE INSTRUCTIONS   It is normal to be sore for a few weeks following surgery. See your caregiver if your pain seems to be getting worse rather than better.  Only take over-the-counter or prescription medicines for pain, discomfort, or fever as directed by your caregiver. Avoid taking medicines that contain aspirin and ibuprofen because they increase the risk of bleeding.  Shower rather than bathe until instructed otherwise by your caregiver.  You may resume a normal diet and activities as directed by your caregiver.  Avoid lifting weight greater than 20 lb (9 kg) or participating in heavy exercise or contact sports for 10 days or as instructed by your caregiver.  Make an appointment to see your caregiver for stitch (suture) or staple removal. SEEK MEDICAL CARE IF:   You have increased bleeding from your wound.  You have redness, swelling, or increasing pain from your wound or in your neck.  There is pus coming from your wound.  You have an oral temperature above 102 F (38.9 C).  There is a bad smell coming from the wound or dressing.  You develop lightheadedness or feel faint.  You develop numbness, tingling, or muscle spasms in your arms, hands, feet, or face.  You have difficulty swallowing. SEEK IMMEDIATE MEDICAL CARE IF:   You develop a rash.  You have difficulty breathing.  You hear whistling noises that come from your chest.  You develop a cough that becomes increasingly worse.  You develop any reaction or side effects to medicines  given.  There is swelling in your neck.  You develop changes in speech or hoarseness, which is getting worse. MAKE SURE YOU:   Understand these instructions.  Will watch your condition.  Will get help right away if you are not doing well or get worse. Document Released: 01/31/2005 Document Revised: 10/06/2011 Document Reviewed: 09/20/2010 [employer] Patient Information 2014 [place], [place].

## 2013-09-30 NOTE — Discharge Planning (Signed)
Copy of home instructions to pt who verbalizes understanding. Also given rx by MD.  D'cd amb. To private car home with all personal belongings, acomp. By husband.

## 2013-09-30 NOTE — Discharge Summary (Signed)
Physician Discharge Summary  Patient ID: LATEEFA CROSBY MRN: [id number] DOB/AGE: 1981-04-29 33 y.o.  Admit date: 09/28/2013 Discharge date: 09/30/2013  Admission Diagnoses: Thyroid goiter  Discharge Diagnoses: Thyroid goiter Active Problems:   S/P thyroidectomy   Discharged Condition: good  Hospital Course: Pt had an uneventful stay. Pt tolerated po well. No bleeding. No stridor. Calcium level stabilized on POD #2.  Consults: None  Significant Diagnostic Studies: None  Treatments: surgery: Total thyroidectomy  Discharge Exam: Blood pressure 103/70, pulse 83, temperature 98 F (36.7 C), temperature source Oral, resp. rate 14, height 5\' 3"  (1.6 m), weight 140 lb 10.5 oz (63.8 kg), SpO2 100.00%. Incision/Wound: C/D/I Voice is strong.  Disposition: 01-Home or Self Care  Discharge Orders   Future Appointments Provider Department Dept Phone   10/04/2013 3:00 PM Fayrene Helper, MD [facility] [phone]   Patient should bring all necessary paperwork to be completed.  Arrive 15 minutes prior to the appointment.   Future Orders Complete By Expires   Diet - low sodium heart healthy  As directed    Diet general  As directed    Increase activity slowly  As directed        Medication List    STOP taking these medications       traMADol 50 MG tablet  Commonly known as:  ULTRAM      TAKE these medications       buPROPion 300 MG 24 hr tablet  Commonly known as:  WELLBUTRIN XL  Take 300 mg by mouth daily.     calcium carbonate 1250 MG tablet  Commonly known as:  OS-CAL - dosed in mg of elemental calcium  Take 1 tablet (500 mg of elemental calcium total) by mouth 2 (two) times daily with a meal.     GAS-X PO  Take 1 tablet by mouth daily as needed (gas).     hydrochlorothiazide 12.5 MG capsule  Commonly known as:  MICROZIDE  Take 1 capsule (12.5 mg total) by mouth daily.     HYDROcodone-acetaminophen 5-325 MG per tablet  Commonly known as:  NORCO/VICODIN   Take 1-2 tablets by mouth every 4 (four) hours as needed for moderate pain or severe pain.     levothyroxine 75 MCG tablet  Commonly known as:  SYNTHROID  Take 1 tablet (75 mcg total) by mouth daily before breakfast.     medroxyPROGESTERone 150 MG/ML injection  Commonly known as:  DEPO-PROVERA  Inject 1 mL (150 mg total) into the muscle every 3 (three) months.     metoprolol 50 MG tablet  Commonly known as:  LOPRESSOR  Take 50 mg by mouth 2 (two) times daily.     NASAL SPRAY 12 HOUR 0.05 % nasal spray  Generic drug:  oxymetazoline  Place 2 sprays into both nostrils daily as needed for congestion. Congestion     omeprazole 20 MG capsule  Commonly known as:  PRILOSEC  Take 20 mg by mouth daily as needed. Acid Reflux     TOPAMAX 100 MG tablet  Generic drug:  topiramate  Take 100 mg by mouth daily.           Follow-up Information   Follow up with Ascencion Dike, MD On 10/06/2013. (as scheduled)    Specialty:  Otolaryngology   Contact information:   [address] [place] [address] [phone]       Signed: Ascencion Dike 09/30/2013, 10:21 AM

## 2013-10-03 ENCOUNTER — Encounter (HOSPITAL_COMMUNITY): Payer: Self-pay | Admitting: Emergency Medicine

## 2013-10-03 ENCOUNTER — Emergency Department (HOSPITAL_COMMUNITY)
Admission: EM | Admit: 2013-10-03 | Discharge: 2013-10-03 | Disposition: A | Discharge status: Home / Self Care | Payer: Medicare Other | Attending: Emergency Medicine | Admitting: Emergency Medicine

## 2013-10-03 DIAGNOSIS — M2420 Disorder of ligament, unspecified site: Secondary | ICD-10-CM | POA: Insufficient documentation

## 2013-10-03 DIAGNOSIS — R5383 Other fatigue: Principal | ICD-10-CM

## 2013-10-03 DIAGNOSIS — I10 Essential (primary) hypertension: Secondary | ICD-10-CM | POA: Diagnosis not present

## 2013-10-03 DIAGNOSIS — M629 Disorder of muscle, unspecified: Secondary | ICD-10-CM | POA: Diagnosis not present

## 2013-10-03 DIAGNOSIS — Z79899 Other long term (current) drug therapy: Secondary | ICD-10-CM | POA: Diagnosis not present

## 2013-10-03 DIAGNOSIS — K219 Gastro-esophageal reflux disease without esophagitis: Secondary | ICD-10-CM | POA: Insufficient documentation

## 2013-10-03 DIAGNOSIS — Z9889 Other specified postprocedural states: Secondary | ICD-10-CM | POA: Diagnosis not present

## 2013-10-03 DIAGNOSIS — M129 Arthropathy, unspecified: Secondary | ICD-10-CM | POA: Diagnosis not present

## 2013-10-03 DIAGNOSIS — E890 Postprocedural hypothyroidism: Secondary | ICD-10-CM

## 2013-10-03 DIAGNOSIS — R209 Unspecified disturbances of skin sensation: Secondary | ICD-10-CM | POA: Diagnosis not present

## 2013-10-03 DIAGNOSIS — F3289 Other specified depressive episodes: Secondary | ICD-10-CM | POA: Insufficient documentation

## 2013-10-03 DIAGNOSIS — R5381 Other malaise: Secondary | ICD-10-CM | POA: Insufficient documentation

## 2013-10-03 DIAGNOSIS — F411 Generalized anxiety disorder: Secondary | ICD-10-CM | POA: Insufficient documentation

## 2013-10-03 DIAGNOSIS — Z87891 Personal history of nicotine dependence: Secondary | ICD-10-CM | POA: Insufficient documentation

## 2013-10-03 DIAGNOSIS — F329 Major depressive disorder, single episode, unspecified: Secondary | ICD-10-CM | POA: Insufficient documentation

## 2013-10-03 DIAGNOSIS — G43909 Migraine, unspecified, not intractable, without status migrainosus: Secondary | ICD-10-CM | POA: Insufficient documentation

## 2013-10-03 DIAGNOSIS — E039 Hypothyroidism, unspecified: Secondary | ICD-10-CM | POA: Insufficient documentation

## 2013-10-03 DIAGNOSIS — I6789 Other cerebrovascular disease: Secondary | ICD-10-CM | POA: Diagnosis not present

## 2013-10-03 NOTE — ED Provider Notes (Signed)
CSN: [id number]     Arrival date & time 10/03/13  1223 History  This chart was scribed for Orpah Greek, MD,  by Stacy Gardner, ED Scribe. The patient was seen in room APA18/AP[facility]18 and the patient's care was started at 1:23 PM.   First MD Initiated Contact with Patient 10/03/13 1314     Chief Complaint  Patient presents with  . Post-op Problem     (Consider location/radiation/quality/duration/timing/severity/associated sxs/prior Treatment) The history is provided by the patient and medical records. No language interpreter was used.   HPI Comments: Martha Montgomery is a 33 y.o. female who presents to the Emergency Department complaining of post- operation problems. Pt had a total thyroidectomy four days ago and she has been experiencing complications two days after. She has the associated symptoms of weakness, low circulation in her hands/legs, facial muscle tightness and generally feeling "off". She mentions that she has an appointement with Dr. Moshe Cipro tomorrow. She has not eaten anything today. Nothing seems to resolve her symptoms. She reports she feels better since arriving to the ED.  Past Medical History  Diagnosis Date  . Hypertension x 6 years   . Thyroid goiter   . Gastritis nov. 2011    EGD by Dr. Oneida Alar, negative h pylori  . Hypothyroidism   . GERD (gastroesophageal reflux disease)   . Migraines     "15/month average" (09/28/2013)  . Arthritis     "knees, joints, hands, toes" (09/28/2013)  . Gout   . Anxiety   . Anxiety disorder   . Depression    Past Surgical History  Procedure Laterality Date  . Cesarean section  2008  . Chondroplasty  04/02/2012    Procedure: CHONDROPLASTY;  Surgeon: Carole Civil, MD;  Location: [facility];  Service: Orthopedics;  Laterality: Left;  of left patella  . Colonoscopy    . Upper gi endoscopy    . Total thyroidectomy  09/27/2013  . Incision and drainage abscess  1997; 2005    "under right buttocks; under left arm"  .  Thyroidectomy N/A 09/28/2013    Procedure: TOTAL THYROIDECTOMY;  Surgeon: Ascencion Dike, MD;  Location: [facility];  Service: ENT;  Laterality: N/A;   Family History  Problem Relation Age of Onset  . Arthritis Mother   . Migraines Mother   . Hypertension Mother   . Asthma      family history   . Cancer Maternal Grandmother     lung   History  Substance Use Topics  . Smoking status: Former Smoker -- 1.00 packs/day for 5 years    Types: Cigarettes    Quit date: 09/11/2010  . Smokeless tobacco: Never Used  . Alcohol Use: Yes     Comment: 09/28/2013 "have a drink a couple times per yr"   OB History   Grav Para Term Preterm Abortions TAB SAB Ect Mult Living                 Review of Systems  HENT:       Facial tightness   Neurological: Positive for weakness.  All other systems reviewed and are negative.      Allergies  Codeine and Soybean-containing drug products  Home Medications   Current Outpatient Rx  Name  Route  Sig  Dispense  Refill  . buPROPion (WELLBUTRIN XL) 300 MG 24 hr tablet   Oral   Take 300 mg by mouth daily.         . calcium carbonate (  OS-CAL - DOSED IN MG OF ELEMENTAL CALCIUM) 1250 MG tablet   Oral   Take 1 tablet (500 mg of elemental calcium total) by mouth 2 (two) times daily with a meal.   60 tablet   5   . hydrochlorothiazide (MICROZIDE) 12.5 MG capsule   Oral   Take 1 capsule (12.5 mg total) by mouth daily.   30 capsule   3   . levothyroxine (SYNTHROID) 75 MCG tablet   Oral   Take 1 tablet (75 mcg total) by mouth daily before breakfast.   30 tablet   10   . metoprolol (LOPRESSOR) 50 MG tablet   Oral   Take 50 mg by mouth 2 (two) times daily.         Marland Kitchen omeprazole (PRILOSEC) 20 MG capsule   Oral   Take 20 mg by mouth daily as needed. Acid Reflux         . oxymetazoline (NASAL SPRAY 12 HOUR) 0.05 % nasal spray   Each Nare   Place 2 sprays into both nostrils daily as needed for congestion. Congestion         . Simethicone (GAS-X  PO)   Oral   Take 1 tablet by mouth daily as needed (gas).         . topiramate (TOPAMAX) 100 MG tablet   Oral   Take 100 mg by mouth daily.         . traMADol (ULTRAM) 50 MG tablet   Oral   Take 50 mg by mouth every 6 (six) hours as needed for moderate pain.         . medroxyPROGESTERone (DEPO-PROVERA) 150 MG/ML injection   Intramuscular   Inject 1 mL (150 mg total) into the muscle every 3 (three) months.   1 mL   4    BP 123/73  Pulse 88  Temp(Src) 98.8 F (37.1 C) (Oral)  Resp 17  Ht 5\' 3"  (1.6 m)  Wt 140 lb (63.504 kg)  BMI 24.81 kg/m2  SpO2 99% Physical Exam  Constitutional: She is oriented to person, place, and time. She appears well-developed and well-nourished. No distress.  HENT:  Head: Normocephalic and atraumatic.  Right Ear: Hearing normal.  Left Ear: Hearing normal.  Nose: Nose normal.  Mouth/Throat: Oropharynx is clear and moist and mucous membranes are normal.  Eyes: Conjunctivae and EOM are normal. Pupils are equal, round, and reactive to light.  Neck: Normal range of motion. Neck supple.  Cardiovascular: Normal rate, regular rhythm, S1 normal and S2 normal.   Pulmonary/Chest: Effort normal and breath sounds normal. No respiratory distress.  Abdominal: Soft. Normal appearance. There is no hepatosplenomegaly. There is no tenderness at McBurney's point and negative Murphy's sign. No hernia.  Musculoskeletal: Normal range of motion.  Neurological: She is alert and oriented to person, place, and time. She has normal strength. No cranial nerve deficit or sensory deficit. Coordination normal. GCS eye subscore is 4. GCS verbal subscore is 5. GCS motor subscore is 6.  Skin: Skin is warm, dry and intact. No rash noted. No cyanosis or erythema.  Transverse well-healed incision No drainage or redness  Psychiatric: She has a normal mood and affect. Her speech is normal and behavior is normal. Thought content normal.    ED Course  Procedures (including  critical care time) DIAGNOSTIC STUDIES: Oxygen Saturation is 99% on room air, normal by my interpretation.    COORDINATION OF CARE:  1:27 PM Discussed course of care with pt . Pt  understands and agrees.    Labs Review Labs Reviewed - No data to display Imaging Review No results found.   EKG Interpretation None      MDM   Final diagnoses:  None    Patient presented with tingling of her hands and feet, feeling like they are swollen. She some sensation of possible facial tightness and swelling as well. Patient underwent thyroidectomy 5 days ago. She was just started on Synthroid 2 days ago. I suspect symptoms are secondary to the recent thyroidectomy with adjustment to the thyroid replacement. Patient does not wish to have any blood work performed at this time. Her wound is examined, no sign of infection. Examination was otherwise unremarkable. She reports that she has a followup established with her primary doctor for tomorrow. She says she is feeling much better and would like to be discharged. I feel this is reasonable, as she has no concerning features on her exam or history, and has followup tomorrow.    Orpah Greek, MD 10/03/13 [phone]

## 2013-10-03 NOTE — Discharge Instructions (Signed)
Thyroidectomy Thyroidectomy is the removal of part or all of your thyroid gland. Your thyroid gland is a butterfly-shaped gland at the base of your neck. It produces a substance called thyroid hormone, which regulates the physical and chemical processes that keep your body functioning and make energy available to your body (metabolism). The amount of thyroid gland tissue that is removed during a thyroidectomy depends on the reason for the procedure. Typically, if only a part of your gland is removed, enough thyroid gland tissue remains to maintain normal function. If your entire thyroid gland is removed or if the amount of thyroid gland tissue remaining is inadequate to maintain normal function, you will need life-long treatment with thyroid hormone on a daily basis. Thyroidectomy maybe performed when you have the following conditions:  Thyroid nodules. These are small, abnormal collections of tissue that form inside the thyroid gland. If these nodules begin to enlarge at a rapid rate, a sample of tissue from the nodule is taken through a needle and examined (needle biopsy). This is done to determine if the nodules are cancerous. Depending on the outcome of this exam, thyroidectomy may be necessary.  Thyroid cancer.  Goiter, which is an enlarged thyroid gland. All or part of the thyroid gland may be removed if the gland has become so large that it causes difficulty breathing or swallowing.  Hyperthyroidism. This is when the thyroid gland produces too much thyroid hormone. Hypothyroidism can cause symptoms of fluctuating weight, intolerance to heat, irritability, shortness of breath, and chest pain. LET YOUR CAREGIVER KNOW ABOUT:   Allergies to food or medicine.  Medicines that you are taking, including vitamins, herbs, eyedrops, over-the-counter medicines, and creams.  Previous problems you have had with anesthetics or numbing medicines.  History of bleeding problems or blood clots.  Previous  surgeries you have had.  Other health problems, including diabetes and kidney problems, you have had.  Possibility of pregnancy, if this applies. BEFORE THE PROCEDURE   Do not eat or drink anything, including water, for at least 6 hours before the procedure.  Ask your caregiver whether you should stop taking certain medicines before the day of the procedure. PROCEDURE  There are different ways that thyroidectomy is performed. For each type, you will be given a medicine to make you sleep (general anesthetic). The three main types of thyroidectomy are listed as follows:  Conventional thyroidectomy A cut (incision) in the center portion of your lower neck is made with a scalpel. Muscles below your skin are separated to gain access to your thyroid gland. Your thyroid gland is dissected from your windpipe (trachea). Often a drain is placed at the incision site to drain any blood that accumulates under the skin after the procedure. This drain will be removed before you go home. The wound from the incision should heal within 2 weeks.  Endoscopic thyroidectomy Small incisions are made in your lower neck. A small instrument (endoscope) is inserted under your skin at the incision sites. The endoscope used for thyroidectomy consists of 2 flexible tubes. Inside one of the tubes is a video camera that is used to guide the Psychologist, sport and exercise. Tools to remove the thyroid gland, including a tool to cut the gland (dissectors) and a suction device, are inserted through the other tube. The surgeon uses the dissectors to dissect the thyroid gland from the trachea and remove it.  Robotic thyroidectomy This procedure allows your thyroid gland to be removed through incisions in your armpit, your chest, or high in your  neck. Instruments similar to endoscopes provide a 3-dimensional picture of the surgical site. Dissecting instruments are controlled by devices similar to joysticks. These devices allow more accurate manipulation of the  instruments. After the blood supply to the gland is removed, the gland is cut into several pieces and removed through the incisions. RISKS AND COMPLICATIONS Complications associated with thyroidectomy are rare, but they can occur. Possible complications include:  A decrease in parathyroid hormone levels (hypoparathyroidism) Your parathyroid glands are located close behind your thyroid gland. They are responsible for maintaining calcium levels inthe body. If they are damaged or removed, levels of calcium in the blood become low and nerves become irritable, which can cause muscle spasms. Medicines are available to treat this.  Bacterial infection This can often be treated with medicines that kill bacteria (antibiotics).  Damage to your voice box nerves This could cause hoarseness or complete loss of voice.  Bleeding or airway obstruction. AFTER THE PROCEDURE   You will rest in the recovery room as you wake up.  When you first wake up, your throat may feel slightly sore.  You will not be allowed to eat or drink until instructed otherwise.  You will be taken to your hospital room. You will usually stay at the hospital for 1 or 2 nights.  If a drain is placed during the procedure, it usually is removed the next day.  You may have some mild neck pain.  Your voice may be weak. This usually is temporary. Document Released: 01/07/2001 Document Revised: 11/08/2012 Document Reviewed: 10/16/2010 [facility] Patient Information 2014 [place].

## 2013-10-03 NOTE — ED Notes (Signed)
Pt states tingling to hands and feet. Weakness in legs. Pt states her face feels tight. Pt had thyroidectomy 3/4. Symptoms began last night.

## 2013-10-04 ENCOUNTER — Other Ambulatory Visit (HOSPITAL_COMMUNITY)
Admission: RE | Admit: 2013-10-04 | Discharge: 2013-10-04 | Disposition: A | Discharge status: Home / Self Care | Payer: Medicare Other | Source: Ambulatory Visit | Attending: Family Medicine | Admitting: Family Medicine

## 2013-10-04 ENCOUNTER — Ambulatory Visit (INDEPENDENT_AMBULATORY_CARE_PROVIDER_SITE_OTHER): Payer: Medicare Other | Admitting: Family Medicine

## 2013-10-04 ENCOUNTER — Encounter: Payer: Self-pay | Admitting: Family Medicine

## 2013-10-04 VITALS — BP 126/82 | HR 100 | Resp 18 | Ht 63.0 in | Wt 140.1 lb

## 2013-10-04 DIAGNOSIS — I10 Essential (primary) hypertension: Secondary | ICD-10-CM

## 2013-10-04 DIAGNOSIS — Z124 Encounter for screening for malignant neoplasm of cervix: Secondary | ICD-10-CM | POA: Diagnosis not present

## 2013-10-04 DIAGNOSIS — Z113 Encounter for screening for infections with a predominantly sexual mode of transmission: Secondary | ICD-10-CM

## 2013-10-04 DIAGNOSIS — Z1239 Encounter for other screening for malignant neoplasm of breast: Secondary | ICD-10-CM | POA: Diagnosis not present

## 2013-10-04 DIAGNOSIS — N760 Acute vaginitis: Secondary | ICD-10-CM | POA: Diagnosis not present

## 2013-10-04 DIAGNOSIS — M899 Disorder of bone, unspecified: Secondary | ICD-10-CM

## 2013-10-04 DIAGNOSIS — M949 Disorder of cartilage, unspecified: Secondary | ICD-10-CM

## 2013-10-04 DIAGNOSIS — R5381 Other malaise: Secondary | ICD-10-CM

## 2013-10-04 DIAGNOSIS — Z0000 Encounter for general adult medical examination without abnormal findings: Secondary | ICD-10-CM | POA: Diagnosis not present

## 2013-10-04 DIAGNOSIS — Z01419 Encounter for gynecological examination (general) (routine) without abnormal findings: Secondary | ICD-10-CM | POA: Diagnosis not present

## 2013-10-04 DIAGNOSIS — R5383 Other fatigue: Secondary | ICD-10-CM

## 2013-10-04 NOTE — Patient Instructions (Signed)
F/u in August, call if you need me be before  Fasting lipid, chem 7,  , vit D, HIV  in August.  Continue current medication  Pap and cultures today  Fall Prevention and Home Safety Falls cause injuries and can affect all age groups. It is possible to prevent falls.  HOW TO PREVENT FALLS  Wear shoes with rubber soles that do not have an opening for your toes.  Keep the inside and outside of your house well lit.  Use night lights throughout your home.  Remove clutter from floors.  Clean up floor spills.  Remove throw rugs or fasten them to the floor with carpet tape.  Do not place electrical cords across pathways.  Put grab bars by your tub, shower, and toilet. Do not use towel bars as grab bars.  Put handrails on both sides of the stairway. Fix loose handrails.  Do not climb on stools or stepladders, if possible.  Do not wax your floors.  Repair uneven or unsafe sidewalks, walkways, or stairs.  Keep items you use a lot within reach.  Be aware of pets.  Keep emergency numbers next to the telephone.  Put smoke detectors in your home and near bedrooms. Ask your doctor what other things you can do to prevent falls. Document Released: 05/10/2009 Document Revised: 01/13/2012 Document Reviewed: 10/14/2011 [facility] Patient Information 2014 [place], [place].

## 2013-10-06 ENCOUNTER — Ambulatory Visit (INDEPENDENT_AMBULATORY_CARE_PROVIDER_SITE_OTHER): Payer: Medicare Other | Admitting: Otolaryngology

## 2013-10-06 DIAGNOSIS — E0789 Other specified disorders of thyroid: Secondary | ICD-10-CM | POA: Diagnosis not present

## 2013-10-06 DIAGNOSIS — E618 Deficiency of other specified nutrient elements: Secondary | ICD-10-CM | POA: Diagnosis not present

## 2013-10-07 LAB — CERVICOVAGINAL ANCILLARY ONLY
Bacterial vaginitis: POSITIVE — AB
CANDIDA VAGINITIS: NEGATIVE

## 2013-10-10 ENCOUNTER — Other Ambulatory Visit: Payer: Self-pay | Admitting: Family Medicine

## 2013-10-10 MED ORDER — FLUCONAZOLE 150 MG PO TABS: 150.0000 mg | ORAL_TABLET | Freq: Once | ORAL | Status: DC

## 2013-10-10 MED ORDER — METRONIDAZOLE 500 MG PO TABS: 500.0000 mg | ORAL_TABLET | Freq: Two times a day (BID) | ORAL | Status: DC

## 2013-10-13 ENCOUNTER — Telehealth: Payer: Self-pay

## 2013-10-21 DIAGNOSIS — E0789 Other specified disorders of thyroid: Secondary | ICD-10-CM | POA: Diagnosis not present

## 2013-10-21 DIAGNOSIS — E618 Deficiency of other specified nutrient elements: Secondary | ICD-10-CM | POA: Diagnosis not present

## 2013-10-25 NOTE — Telephone Encounter (Signed)
Done

## 2013-10-30 NOTE — Progress Notes (Signed)
   Subjective:    Patient ID: Martha Montgomery, female    DOB: Apr 26, 1981, 33 y.o.   MRN: [id number]  HPI Pt in for annual physical exam to include a pap smear. She has no concerns and is generally doing fairly well    Review of Systems See HPI Denies recent fever or chills. Denies sinus pressure, nasal congestion, ear pain or sore throat. Denies chest congestion, productive cough or wheezing. Denies chest pains, palpitations and leg swelling Denies abdominal pain, nausea, vomiting,diarrhea or constipation.   Denies dysuria, frequency, hesitancy or incontinence. Denies joint pain, swelling and limitation in mobility. Denies headaches, seizures, numbness, or tingling. Denies  Uncontrolled depression, anxiety or insomnia. Denies skin break down or rash.        Objective:   Physical Exam  Pleasant well nourished female, alert and oriented x 3, in no cardio-pulmonary distress. Afebrile. HEENT No facial trauma or asymetry. Sinuses non tender.  EOMI, PERTL, fundoscopic exam is normal, no hemorhage or exudate.  External ears normal, tympanic membranes clear. Oropharynx moist, no exudate, good dentition. Neck: supple, no adenopathy,JVD or thyromegaly.No bruits.  Chest: Clear to ascultation bilaterally.No crackles or wheezes. Non tender to palpation  Breast: No asymetry,no masses. No nipple discharge or inversion. No axillary or supraclavicular adenopathy  Cardiovascular system; Heart sounds normal,  S1 and  S2 ,no S3.  No murmur, or thrill. Apical beat not displaced Peripheral pulses normal.  Abdomen: Soft, non tender, no organomegaly or masses. No bruits. Bowel sounds normal. No guarding, tenderness or rebound.  GU: External genitalia normal. No lesions. Vaginal canal normal.White  discharge. Uterus normal size, no adnexal masses, no cervical motion or adnexal tenderness.  Musculoskeletal exam: Full ROM of spine, hips , shoulders and knees. No deformity ,swelling  or crepitus noted. No muscle wasting or atrophy.   Neurologic: Cranial nerves 2 to 12 intact. Power, tone ,sensation and reflexes normal throughout. No disturbance in gait. No tremor.  Skin: Intact, no ulceration, erythema , scaling or rash noted. Pigmentation normal throughout  Psych; Normal mood and affect. Judgement and concentration normal       Assessment & Plan:  Routine general medical examination at a health care facility Annual exam as documented. Counseling done  re healthy lifestyle involving commitment to 150 minutes exercise per week, heart healthy diet, and attaining healthy weight.The importance of adequate sleep also discussed. Regular seat belt use and safe storage  of firearms if patient has them, is also discussed. Changes in health habits are decided on by the patient with goals and time frames  set for achieving them. Immunization and cancer screening needs are specifically addressed at this visit.

## 2013-10-30 NOTE — Assessment & Plan Note (Signed)
Annual exam as documented. Counseling done  re healthy lifestyle involving commitment to 150 minutes exercise per week, heart healthy diet, and attaining healthy weight.The importance of adequate sleep also discussed. Regular seat belt use and safe storage  of firearms if patient has them, is also discussed. Changes in health habits are decided on by the patient with goals and time frames  set for achieving them. Immunization and cancer screening needs are specifically addressed at this visit.  

## 2013-12-01 ENCOUNTER — Ambulatory Visit (INDEPENDENT_AMBULATORY_CARE_PROVIDER_SITE_OTHER): Payer: Medicare Other | Admitting: Otolaryngology

## 2013-12-01 DIAGNOSIS — E0789 Other specified disorders of thyroid: Secondary | ICD-10-CM | POA: Diagnosis not present

## 2013-12-01 DIAGNOSIS — E618 Deficiency of other specified nutrient elements: Secondary | ICD-10-CM | POA: Diagnosis not present

## 2013-12-15 DIAGNOSIS — Z79899 Other long term (current) drug therapy: Secondary | ICD-10-CM | POA: Diagnosis not present

## 2013-12-15 DIAGNOSIS — M175 Other unilateral secondary osteoarthritis of knee: Secondary | ICD-10-CM | POA: Diagnosis not present

## 2013-12-15 DIAGNOSIS — R51 Headache: Secondary | ICD-10-CM | POA: Diagnosis not present

## 2013-12-15 DIAGNOSIS — G894 Chronic pain syndrome: Secondary | ICD-10-CM | POA: Diagnosis not present

## 2014-01-09 ENCOUNTER — Telehealth: Payer: Self-pay | Admitting: *Deleted

## 2014-01-09 NOTE — Telephone Encounter (Signed)
Pt called stating it has been about a 1-2 years seeing Dr. Oneida Alar, PT has a appointment 02/09/14 with Vicente Males, pt has been having discharge from her rectal area she has been having a lot of gas but nothing comes out unless it is the discharge. Pt is not sure what it is, pt said it has no smell. Please advise [phone]

## 2014-01-09 NOTE — Telephone Encounter (Signed)
I called pt. She is having some thick yellow discharge from her rectum at times.  She does not have Bm's regularly. She has appt with Laban Emperor, NP in July.  Please advise!

## 2014-01-10 NOTE — Telephone Encounter (Signed)
PLEASE CALL PT. She is probably seeing mucous. The colon is on long mucous producing tube. DRINK WATER TO KEEP YOUR URINE LIGHT YELLOW. FOLLOW A HIGH FIBER DIET. SHE MAY PICK UP HO OR GO TO [facility]. AVOID ITEMS THAT CAUSE BLOATING & GAS. Use MIRALAX bid to have a bm 1-2 times a week.

## 2014-01-10 NOTE — Telephone Encounter (Signed)
Tried to call and mail box is full.  

## 2014-01-13 NOTE — Telephone Encounter (Signed)
LmOM for a return call on mobile phone. Could not leave a message on home phone.

## 2014-01-17 NOTE — Telephone Encounter (Signed)
Called and informed pt. Diet mailed to pt.

## 2014-02-09 ENCOUNTER — Ambulatory Visit (INDEPENDENT_AMBULATORY_CARE_PROVIDER_SITE_OTHER): Payer: Medicare Other | Admitting: Gastroenterology

## 2014-02-09 ENCOUNTER — Encounter: Payer: Self-pay | Admitting: Gastroenterology

## 2014-02-09 VITALS — BP 123/86 | HR 81 | Temp 97.4°F | Ht 62.0 in | Wt 149.2 lb

## 2014-02-09 DIAGNOSIS — K589 Irritable bowel syndrome without diarrhea: Secondary | ICD-10-CM

## 2014-02-09 MED ORDER — HYDROCORTISONE 2.5 % RE CREA: 1.0000 "application " | TOPICAL_CREAM | Freq: Two times a day (BID) | RECTAL | Status: DC

## 2014-02-09 MED ORDER — LINACLOTIDE 145 MCG PO CAPS: 145.0000 ug | ORAL_CAPSULE | Freq: Every day | ORAL | Status: DC

## 2014-02-09 NOTE — Patient Instructions (Signed)
For constipation: start taking Linzess 1 capsule each morning on an empty stomach, at least 30 minutes before breakfast. I have provided a voucher and refills.  I have sent a cream to your pharmacy to use twice a day for 7 days per rectum.  We will see you back in 6-8 weeks!  Constipation Constipation is when a person has fewer than three bowel movements a week, has difficulty having a bowel movement, or has stools that are dry, hard, or larger than normal. As people grow older, constipation is more common. If you try to fix constipation with medicines that make you have a bowel movement (laxatives), the problem may get worse. Long-term laxative use may cause the muscles of the colon to become weak. A low-fiber diet, not taking in enough fluids, and taking certain medicines may make constipation worse.  CAUSES   Certain medicines, such as antidepressants, pain medicine, iron supplements, antacids, and water pills.   Certain diseases, such as diabetes, irritable bowel syndrome (IBS), thyroid disease, or depression.   Not drinking enough water.   Not eating enough fiber-rich foods.   Stress or travel.   Lack of physical activity or exercise.   Ignoring the urge to have a bowel movement.   Using laxatives too much.  SIGNS AND SYMPTOMS   Having fewer than three bowel movements a week.   Straining to have a bowel movement.   Having stools that are hard, dry, or larger than normal.   Feeling full or bloated.   Pain in the lower abdomen.   Not feeling relief after having a bowel movement.  DIAGNOSIS  Your health care provider will take a medical history and perform a physical exam. Further testing may be done for severe constipation. Some tests may include:  A barium enema X-ray to examine your rectum, colon, and, sometimes, your small intestine.   A sigmoidoscopy to examine your lower colon.   A colonoscopy to examine your entire colon. TREATMENT  Treatment  will depend on the severity of your constipation and what is causing it. Some dietary treatments include drinking more fluids and eating more fiber-rich foods. Lifestyle treatments may include regular exercise. If these diet and lifestyle recommendations do not help, your health care provider may recommend taking over-the-counter laxative medicines to help you have bowel movements. Prescription medicines may be prescribed if over-the-counter medicines do not work.  HOME CARE INSTRUCTIONS   Eat foods that have a lot of fiber, such as fruits, vegetables, whole grains, and beans.  Limit foods high in fat and processed sugars, such as french fries, hamburgers, cookies, candies, and soda.   A fiber supplement may be added to your diet if you cannot get enough fiber from foods.   Drink enough fluids to keep your urine clear or pale yellow.   Exercise regularly or as directed by your health care provider.   Go to the restroom when you have the urge to go. Do not hold it.   Only take over-the-counter or prescription medicines as directed by your health care provider. Do not take other medicines for constipation without talking to your health care provider first.  [place] IF:   You have bright red blood in your stool.   Your constipation lasts for more than 4 days or gets worse.   You have abdominal or rectal pain.   You have thin, pencil-like stools.   You have unexplained weight loss. MAKE SURE YOU:   Understand these instructions.  Will  watch your condition.  Will get help right away if you are not doing well or get worse. Document Released: 04/11/2004 Document Revised: 07/19/2013 Document Reviewed: 04/25/2013 [facility] Patient Information 2015 [place], [place]. This information is not intended to replace advice given to you by your health care provider. Make sure you discuss any questions you have with your health care provider.

## 2014-02-09 NOTE — Progress Notes (Signed)
cc'd to pcp 

## 2014-02-09 NOTE — Progress Notes (Signed)
Primary Care Physician:  Tula Nakayama, MD Primary Gastroenterologist:  Dr. Oneida Alar   Chief Complaint  Patient presents with  . RECTAL MUCUS    HPI:   Martha Montgomery presents today secondary to concerns regarding rectal mucus. Seeing a large amount of mucus in stool, more than usual. More on a regular basis. Has history of IBS. Has issues with constipation. BM 2-3 times per week. Bristol scale #1 and #2. Usually accompanied with the BM. Has abdominal cramping, large amount of bloating. Rare low-volume hematochezia. Feels pressure in rectum at times. Will try to have a BM but incomplete. Multiple OTC agents tried/failed. No lack of appetite, weight loss, or upper GI symptoms.   Last colonoscopy in 2012 with internal hemorrhoids.   Past Medical History  Diagnosis Date  . Hypertension x 6 years   . Thyroid goiter   . Gastritis nov. 2011    EGD by Dr. Oneida Alar, negative h pylori  . Hypothyroidism   . GERD (gastroesophageal reflux disease)   . Migraines     "15/month average" (09/28/2013)  . Arthritis     "knees, joints, hands, toes" (09/28/2013)  . Gout   . Anxiety   . Anxiety disorder   . Depression     Past Surgical History  Procedure Laterality Date  . Cesarean section  2008  . Chondroplasty  04/02/2012    Procedure: CHONDROPLASTY;  Surgeon: Carole Civil, MD;  Location: [facility];  Service: Orthopedics;  Laterality: Left;  of left patella  . Colonoscopy  May 2012    Dr. Oneida Alar: normal colon, small internal hemorrhoids  . Upper gi endoscopy    . Total thyroidectomy  09/27/2013  . Incision and drainage abscess  1997; 2005    "under right buttocks; under left arm"  . Thyroidectomy N/A 09/28/2013    Procedure: TOTAL THYROIDECTOMY;  Surgeon: Ascencion Dike, MD;  Location: [facility] OR;  Service: ENT;  Laterality: N/A;    Current Outpatient Prescriptions  Medication Sig Dispense Refill  . buPROPion (WELLBUTRIN XL) 300 MG 24 hr tablet Take 300 mg by mouth daily.      Marland Kitchen levothyroxine  (SYNTHROID, LEVOTHROID) 75 MCG tablet Take 100 mcg by mouth daily before breakfast.      . medroxyPROGESTERone (DEPO-PROVERA) 150 MG/ML injection Inject 1 mL (150 mg total) into the muscle every 3 (three) months.  1 mL  4  . metoprolol (LOPRESSOR) 50 MG tablet Take 50 mg by mouth 2 (two) times daily.      Marland Kitchen oxymetazoline (NASAL SPRAY 12 HOUR) 0.05 % nasal spray Place 2 sprays into both nostrils daily as needed for congestion. Congestion      . Simethicone (GAS-X PO) Take 1 tablet by mouth daily as needed (gas).      . topiramate (TOPAMAX) 100 MG tablet Take 100 mg by mouth daily.      . traMADol (ULTRAM) 50 MG tablet Take 50 mg by mouth every 6 (six) hours as needed for moderate pain.       No current facility-administered medications for this visit.    Allergies as of 02/09/2014 - Review Complete 02/09/2014  Allergen Reaction Noted  . Codeine Itching   . Soybean-containing drug products Other (See Comments) 12/10/2010    Family History  Problem Relation Age of Onset  . Arthritis Mother   . Migraines Mother   . Hypertension Mother   . Asthma      family history   . Cancer Maternal Grandmother  lung  . Colon cancer Neg Hx     History   Social History  . Marital Status: Married    Spouse Name: N/A    Number of Children: 1  . Years of Education: N/A   Occupational History  . enrollment coordinator [employer]   .     Social History Main Topics  . Smoking status: Former Smoker -- 1.00 packs/day for 5 years    Types: Cigarettes    Quit date: 09/11/2010  . Smokeless tobacco: Never Used     Comment: QUIT SMOKING X 3 YEARS AGO  . Alcohol Use: Yes     Comment:  "have a drink a couple times per yr"  . Drug Use: No  . Sexual Activity: Not Currently    Partners: Male    Birth Control/ Protection: Other-see comments, Injection     Comment: Depo   Other Topics Concern  . Not on file   Social History Narrative   Pt is currently on medicaid. From [place]  and came to [place] in mid 2010    Review of Systems: As mentioned in HPI  Physical Exam: BP 123/86  Pulse 81  Temp(Src) 97.4 F (36.3 C) (Oral)  Ht 5\' 2"  (1.575 m)  Wt 149 lb 3.2 oz (67.677 kg)  BMI 27.28 kg/m2 General:   Alert and oriented. Pleasant and cooperative. Well-nourished and well-developed.  Head:  Normocephalic and atraumatic. Eyes:  Without icterus, sclera clear and conjunctiva pink.  Ears:  Normal auditory acuity. Nose:  No deformity, discharge,  or lesions. Mouth:  No deformity or lesions, oral mucosa pink.  Lungs:  Clear to auscultation bilaterally. No wheezes, rales, or rhonchi. No distress.  Heart:  S1, S2 present without murmurs appreciated.  Abdomen:  +BS, soft, non-tender and non-distended. No HSM noted. No guarding or rebound. No masses appreciated.  Rectal:  Declined Msk:  Symmetrical without gross deformities. Normal posture. Extremities:  Without clubbing or edema. Neurologic:  Alert and  oriented x4;  grossly normal neurologically. Skin:  Intact without significant lesions or rashes. Psych:  Alert and cooperative. Normal mood and affect.

## 2014-02-09 NOTE — Assessment & Plan Note (Signed)
33 year old female with history of IBS-C, failing multiple OTC agents and with need for more aggressive regimen. Likely mucus benign in this setting of constipation and known IBS. Colonoscopy 2012 with internal hemorrhoids; low-volume hematochezia likely secondary to internal hemorrhoids. Declined rectal exam, as young son was present. Will provide proctosol cream BID, add Linzess 145 mcg daily, and return in 6-8 weeks.

## 2014-03-01 ENCOUNTER — Ambulatory Visit: Payer: Medicare Other | Admitting: Family Medicine

## 2014-03-08 ENCOUNTER — Ambulatory Visit: Payer: Medicare Other | Admitting: Family Medicine

## 2014-03-09 ENCOUNTER — Ambulatory Visit (INDEPENDENT_AMBULATORY_CARE_PROVIDER_SITE_OTHER): Payer: Medicare Other | Admitting: Otolaryngology

## 2014-03-23 ENCOUNTER — Ambulatory Visit: Payer: Medicare Other | Admitting: Gastroenterology

## 2014-03-23 ENCOUNTER — Telehealth: Payer: Self-pay | Admitting: Gastroenterology

## 2014-03-23 ENCOUNTER — Encounter: Payer: Self-pay | Admitting: Gastroenterology

## 2014-03-23 NOTE — Telephone Encounter (Signed)
PATIENT WAS NO SHOW, LETTER MAILED

## 2014-05-08 ENCOUNTER — Telehealth: Payer: Self-pay | Admitting: Family Medicine

## 2014-05-08 NOTE — Telephone Encounter (Signed)
Tried to call patient back to tell her she needs an appt. Not able to leave voicemail so I sent a note to the pharmacy that she needs an appt

## 2014-05-09 ENCOUNTER — Other Ambulatory Visit: Payer: Self-pay

## 2014-05-09 MED ORDER — LEVOTHYROXINE SODIUM 75 MCG PO TABS: 100.0000 ug | ORAL_TABLET | Freq: Every day | ORAL | Status: DC

## 2014-05-09 MED ORDER — LEVOTHYROXINE SODIUM 75 MCG PO TABS: 75.0000 ug | ORAL_TABLET | Freq: Every day | ORAL | Status: DC

## 2014-05-29 DIAGNOSIS — G4459 Other complicated headache syndrome: Secondary | ICD-10-CM | POA: Diagnosis not present

## 2014-05-29 DIAGNOSIS — M1990 Unspecified osteoarthritis, unspecified site: Secondary | ICD-10-CM | POA: Diagnosis not present

## 2014-05-29 DIAGNOSIS — G894 Chronic pain syndrome: Secondary | ICD-10-CM | POA: Diagnosis not present

## 2014-05-29 DIAGNOSIS — Z79899 Other long term (current) drug therapy: Secondary | ICD-10-CM | POA: Diagnosis not present

## 2014-05-30 ENCOUNTER — Encounter: Payer: Self-pay | Admitting: Family Medicine

## 2014-05-30 ENCOUNTER — Ambulatory Visit (INDEPENDENT_AMBULATORY_CARE_PROVIDER_SITE_OTHER): Payer: Medicare Other | Admitting: Family Medicine

## 2014-05-30 ENCOUNTER — Ambulatory Visit (INDEPENDENT_AMBULATORY_CARE_PROVIDER_SITE_OTHER): Payer: Medicare Other

## 2014-05-30 VITALS — BP 122/82 | HR 91 | Resp 16 | Ht 62.0 in | Wt 156.1 lb

## 2014-05-30 DIAGNOSIS — Z308 Encounter for other contraceptive management: Secondary | ICD-10-CM | POA: Diagnosis not present

## 2014-05-30 DIAGNOSIS — J302 Other seasonal allergic rhinitis: Secondary | ICD-10-CM | POA: Diagnosis not present

## 2014-05-30 DIAGNOSIS — Z23 Encounter for immunization: Secondary | ICD-10-CM

## 2014-05-30 DIAGNOSIS — G43109 Migraine with aura, not intractable, without status migrainosus: Secondary | ICD-10-CM

## 2014-05-30 DIAGNOSIS — E890 Postprocedural hypothyroidism: Secondary | ICD-10-CM

## 2014-05-30 DIAGNOSIS — I10 Essential (primary) hypertension: Secondary | ICD-10-CM

## 2014-05-30 DIAGNOSIS — F329 Major depressive disorder, single episode, unspecified: Secondary | ICD-10-CM

## 2014-05-30 DIAGNOSIS — F32A Depression, unspecified: Secondary | ICD-10-CM

## 2014-05-30 DIAGNOSIS — F418 Other specified anxiety disorders: Secondary | ICD-10-CM

## 2014-05-30 DIAGNOSIS — F419 Anxiety disorder, unspecified: Secondary | ICD-10-CM

## 2014-05-30 LAB — POCT URINE PREGNANCY: Preg Test, Ur: NEGATIVE

## 2014-05-30 MED ORDER — MOMETASONE FUROATE 50 MCG/ACT NA SUSP: 2.0000 | Freq: Every day | NASAL | Status: DC

## 2014-05-30 MED ORDER — MEDROXYPROGESTERONE ACETATE 150 MG/ML IM SUSP: 150.0000 mg | INTRAMUSCULAR | Status: DC

## 2014-05-30 MED ORDER — LEVOTHYROXINE SODIUM 100 MCG PO TABS: 100.0000 ug | ORAL_TABLET | Freq: Every day | ORAL | Status: DC

## 2014-05-30 NOTE — Patient Instructions (Addendum)
F/u in 4.5 month, call if you need me before  Thankful that you are doing better  Urine is being tested for pregnancy, if negative , 6 month supply of depo provera is sent in  Flu vaccine today  Labs today, TSH , chem 7 ,

## 2014-05-30 NOTE — Progress Notes (Signed)
   Subjective:    Patient ID: Martha Montgomery, female    DOB: Dec 22, 1980, 33 y.o.   MRN: [id number]  HPI The PT is here for follow up and re-evaluation of chronic medical conditions, medication management and review of any available recent lab and radiology data.  Preventive health is updated, specifically   Immunization.   Recently seen by her neurologist who treats her headaches, which are improved. Needs to re establish with psychiatry for ongoing counseling, states she lives on her own with her son, husband has left the home, but they are still in contact. 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  There are no new concerns.  Increased and uncontrolled allergy symptoms with season change, no fevr or chills , needs flu vaccine      Review of Systems See HPI Denies recent fever or chills. Denies sinus pressure,  ear pain or sore throat. Denies chest congestion, productive cough or wheezing. Denies chest pains, palpitations and leg swelling Denies abdominal pain, nausea, vomiting,diarrhea or constipation.   Denies dysuria, frequency, hesitancy or incontinence. Deni uncontrolled  headaches, seizures, numbness, or tingling. Denies uncontrolled  depression, anxiety or insomnia. Denies skin break down or rash.         Objective:   Physical Exam  BP 122/82 mmHg  Pulse 91  Resp 16  Ht 5\' 2"  (1.575 m)  Wt 156 lb 1.9 oz (70.816 kg)  BMI 28.55 kg/m2  SpO2 99% Patient alert and oriented and in no cardiopulmonary distress.  HEENT: No facial asymmetry, EOMI,   oropharynx pink and moist.  Neck supple no JVD, no mass.  Chest: Clear to auscultation bilaterally.  CVS: S1, S2 no murmurs, no S3.Regular rate.  ABD: Soft non tender.   Ext: No edema  MS: Adequate ROM spine, shoulders, hips and knees.  Skin: Intact, no ulcerations or rash  noted.  Psych: Good eye contact, normal affect. Memory intact not anxious or depressed appearing.  CNS: CN 2-12 intact, power,  normal throughout.no focal deficits noted.       Assessment & Plan:  HTN (hypertension), benign Controlled, no change in medication DASH diet and commitment to daily physical activity for a minimum of 30 minutes discussed and encouraged, as a part of hypertension management. The importance of attaining a healthy weight is also discussed.   Migraine with aura Controlled well on current regime , treated by neurology who she will continue to be treated by  Allergic rhinitis Increased and uncontrolled symptoms, daily nasal spray prescribed, salinee flushes for excess pressure recommended also  Post-surgical hypothyroidism Under corrected, dose adjustment needs to be made, pt to be contacted as specimen obtained after visit, will need rept lab in 6 to 8 weeks  Need for prophylactic vaccination and inoculation against influenza Vaccine administered at visit.   Anxiety and depression Improved, mainly due to change in her social situation and improved behavior in her young child who she is home schooling, pt is to return to therapy, she will c, she is not suicidal or homicidal, continue current medsl  Encounter for other contraceptive management Urine pregnancy test is negative, script for 6 month supply of depo provera sent to pharmacy

## 2014-05-31 LAB — TSH: TSH: 10.016 u[IU]/mL — ABNORMAL HIGH (ref 0.350–4.500)

## 2014-06-02 DIAGNOSIS — Z23 Encounter for immunization: Secondary | ICD-10-CM | POA: Insufficient documentation

## 2014-06-02 DIAGNOSIS — Z308 Encounter for other contraceptive management: Secondary | ICD-10-CM | POA: Insufficient documentation

## 2014-06-02 DIAGNOSIS — E890 Postprocedural hypothyroidism: Secondary | ICD-10-CM | POA: Insufficient documentation

## 2014-06-02 NOTE — Assessment & Plan Note (Signed)
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

## 2014-06-02 NOTE — Assessment & Plan Note (Signed)
Under corrected, dose adjustment needs to be made, pt to be contacted as specimen obtained after visit, will need rept lab in 6 to 8 weeks

## 2014-06-02 NOTE — Assessment & Plan Note (Signed)
Urine pregnancy test is negative, script for 6 month supply of depo provera sent to pharmacy

## 2014-06-02 NOTE — Assessment & Plan Note (Signed)
Increased and uncontrolled symptoms, daily nasal spray prescribed, salinee flushes for excess pressure recommended also

## 2014-06-02 NOTE — Assessment & Plan Note (Signed)
Controlled well on current regime , treated by neurology who she will continue to be treated by

## 2014-06-02 NOTE — Assessment & Plan Note (Signed)
Vaccine administered at visit.  

## 2014-06-02 NOTE — Assessment & Plan Note (Signed)
Controlled, no change in medication DASH diet and commitment to daily physical activity for a minimum of 30 minutes discussed and encouraged, as a part of hypertension management. The importance of attaining a healthy weight is also discussed.  

## 2014-06-12 ENCOUNTER — Other Ambulatory Visit: Payer: Self-pay

## 2014-06-12 DIAGNOSIS — E890 Postprocedural hypothyroidism: Secondary | ICD-10-CM

## 2014-06-12 DIAGNOSIS — R7989 Other specified abnormal findings of blood chemistry: Secondary | ICD-10-CM

## 2014-06-12 MED ORDER — LEVOTHYROXINE SODIUM 150 MCG PO TABS: 150.0000 ug | ORAL_TABLET | Freq: Every day | ORAL | Status: DC

## 2014-07-07 ENCOUNTER — Other Ambulatory Visit: Payer: Self-pay

## 2014-07-07 MED ORDER — LEVOTHYROXINE SODIUM 150 MCG PO TABS: 150.0000 ug | ORAL_TABLET | Freq: Every day | ORAL | Status: DC

## 2014-07-25 DIAGNOSIS — E890 Postprocedural hypothyroidism: Secondary | ICD-10-CM | POA: Diagnosis not present

## 2014-07-26 LAB — TSH: TSH: 0.015 u[IU]/mL — AB (ref 0.350–4.500)

## 2014-08-02 ENCOUNTER — Other Ambulatory Visit: Payer: Self-pay

## 2014-08-02 ENCOUNTER — Telehealth: Payer: Self-pay | Admitting: Family Medicine

## 2014-08-02 MED ORDER — LEVOTHYROXINE SODIUM 150 MCG PO TABS: ORAL_TABLET | ORAL | Status: DC

## 2014-08-02 NOTE — Telephone Encounter (Signed)
Tried contacting patient numerous times and called her back right after she called with no answer so i sent her results on mychart and told her to call with any questions

## 2014-09-18 DIAGNOSIS — Z79899 Other long term (current) drug therapy: Secondary | ICD-10-CM | POA: Diagnosis not present

## 2014-09-18 DIAGNOSIS — G4459 Other complicated headache syndrome: Secondary | ICD-10-CM | POA: Diagnosis not present

## 2014-10-31 ENCOUNTER — Encounter: Payer: Medicare Other | Admitting: Family Medicine

## 2014-11-14 DIAGNOSIS — G4459 Other complicated headache syndrome: Secondary | ICD-10-CM | POA: Diagnosis not present

## 2014-11-14 DIAGNOSIS — G894 Chronic pain syndrome: Secondary | ICD-10-CM | POA: Diagnosis not present

## 2014-11-14 DIAGNOSIS — M1990 Unspecified osteoarthritis, unspecified site: Secondary | ICD-10-CM | POA: Diagnosis not present

## 2014-11-14 DIAGNOSIS — Z79899 Other long term (current) drug therapy: Secondary | ICD-10-CM | POA: Diagnosis not present

## 2014-12-22 ENCOUNTER — Other Ambulatory Visit: Payer: Self-pay | Admitting: Family Medicine

## 2015-02-13 DIAGNOSIS — M1990 Unspecified osteoarthritis, unspecified site: Secondary | ICD-10-CM | POA: Diagnosis not present

## 2015-02-13 DIAGNOSIS — G4459 Other complicated headache syndrome: Secondary | ICD-10-CM | POA: Diagnosis not present

## 2015-02-13 DIAGNOSIS — Z79899 Other long term (current) drug therapy: Secondary | ICD-10-CM | POA: Diagnosis not present

## 2015-02-13 DIAGNOSIS — G894 Chronic pain syndrome: Secondary | ICD-10-CM | POA: Diagnosis not present

## 2015-03-09 DIAGNOSIS — G4459 Other complicated headache syndrome: Secondary | ICD-10-CM | POA: Diagnosis not present

## 2015-03-09 DIAGNOSIS — G43719 Chronic migraine without aura, intractable, without status migrainosus: Secondary | ICD-10-CM | POA: Diagnosis not present

## 2015-04-06 DIAGNOSIS — G4459 Other complicated headache syndrome: Secondary | ICD-10-CM | POA: Diagnosis not present

## 2015-04-06 DIAGNOSIS — G43719 Chronic migraine without aura, intractable, without status migrainosus: Secondary | ICD-10-CM | POA: Diagnosis not present

## 2015-05-09 ENCOUNTER — Other Ambulatory Visit: Payer: Self-pay

## 2015-05-09 MED ORDER — LEVOTHYROXINE SODIUM 150 MCG PO TABS: ORAL_TABLET | ORAL | Status: DC

## 2015-05-16 DIAGNOSIS — Z79899 Other long term (current) drug therapy: Secondary | ICD-10-CM | POA: Diagnosis not present

## 2015-05-16 DIAGNOSIS — M1990 Unspecified osteoarthritis, unspecified site: Secondary | ICD-10-CM | POA: Diagnosis not present

## 2015-05-16 DIAGNOSIS — G4459 Other complicated headache syndrome: Secondary | ICD-10-CM | POA: Diagnosis not present

## 2015-05-16 DIAGNOSIS — G894 Chronic pain syndrome: Secondary | ICD-10-CM | POA: Diagnosis not present

## 2015-05-30 ENCOUNTER — Telehealth: Payer: Self-pay | Admitting: Family Medicine

## 2015-05-30 ENCOUNTER — Other Ambulatory Visit: Payer: Self-pay

## 2015-05-30 MED ORDER — METOPROLOL TARTRATE 50 MG PO TABS: 50.0000 mg | ORAL_TABLET | Freq: Two times a day (BID) | ORAL | Status: DC

## 2015-05-30 NOTE — Telephone Encounter (Signed)
Patient is asking for a refill on BP medication metoprolol (LOPRESSOR) 50 MG tablet please advise?

## 2015-05-30 NOTE — Telephone Encounter (Signed)
Med refilled.

## 2015-05-31 ENCOUNTER — Telehealth: Payer: Self-pay | Admitting: Family Medicine

## 2015-05-31 NOTE — Telephone Encounter (Signed)
Patient is calling asking for refill on medroxyPROGESTERone (DEPO-PROVERA) 150 MG/ML injection andmetoprolol (LOPRESSOR) 50 MG tablet please advise?

## 2015-06-01 NOTE — Telephone Encounter (Signed)
1 refill given since has dec appt

## 2015-06-06 ENCOUNTER — Telehealth: Payer: Self-pay | Admitting: Family Medicine

## 2015-06-06 MED ORDER — METOPROLOL TARTRATE 50 MG PO TABS: 50.0000 mg | ORAL_TABLET | Freq: Two times a day (BID) | ORAL | Status: DC

## 2015-06-06 NOTE — Telephone Encounter (Signed)
1 refill only sent to [facility]

## 2015-06-06 NOTE — Telephone Encounter (Signed)
Patient states that the refill should have went to the [facility] in [place], please advise?

## 2015-06-28 ENCOUNTER — Encounter: Payer: Self-pay | Admitting: Family Medicine

## 2015-06-28 ENCOUNTER — Ambulatory Visit (INDEPENDENT_AMBULATORY_CARE_PROVIDER_SITE_OTHER): Payer: Medicare Other | Admitting: Family Medicine

## 2015-06-28 VITALS — BP 125/80 | HR 88 | Temp 98.8°F | Resp 14 | Ht 62.0 in | Wt 149.0 lb

## 2015-06-28 DIAGNOSIS — Z113 Encounter for screening for infections with a predominantly sexual mode of transmission: Secondary | ICD-10-CM

## 2015-06-28 DIAGNOSIS — I10 Essential (primary) hypertension: Secondary | ICD-10-CM

## 2015-06-28 DIAGNOSIS — Z23 Encounter for immunization: Secondary | ICD-10-CM | POA: Diagnosis not present

## 2015-06-28 DIAGNOSIS — F418 Other specified anxiety disorders: Secondary | ICD-10-CM | POA: Diagnosis not present

## 2015-06-28 DIAGNOSIS — E890 Postprocedural hypothyroidism: Secondary | ICD-10-CM

## 2015-06-28 DIAGNOSIS — E039 Hypothyroidism, unspecified: Secondary | ICD-10-CM

## 2015-06-28 DIAGNOSIS — G43109 Migraine with aura, not intractable, without status migrainosus: Secondary | ICD-10-CM

## 2015-06-28 DIAGNOSIS — Z308 Encounter for other contraceptive management: Secondary | ICD-10-CM

## 2015-06-28 DIAGNOSIS — F32A Depression, unspecified: Secondary | ICD-10-CM

## 2015-06-28 DIAGNOSIS — E559 Vitamin D deficiency, unspecified: Secondary | ICD-10-CM

## 2015-06-28 DIAGNOSIS — E663 Overweight: Secondary | ICD-10-CM

## 2015-06-28 DIAGNOSIS — F329 Major depressive disorder, single episode, unspecified: Secondary | ICD-10-CM

## 2015-06-28 DIAGNOSIS — F419 Anxiety disorder, unspecified: Principal | ICD-10-CM

## 2015-06-28 DIAGNOSIS — J3089 Other allergic rhinitis: Secondary | ICD-10-CM

## 2015-06-28 DIAGNOSIS — Z114 Encounter for screening for human immunodeficiency virus [HIV]: Secondary | ICD-10-CM

## 2015-06-28 MED ORDER — MIRTAZAPINE 7.5 MG PO TABS: 7.5000 mg | ORAL_TABLET | Freq: Every day | ORAL | Status: DC

## 2015-06-28 NOTE — Patient Instructions (Signed)
Annual wellness in 3 month, call if you need me before   Flu vaccine today  Medication sent for depression , and you are referred    Labs today and script for depo provera  Please work on good  health habits so that your health will improve. 1. Commitment to daily physical activity for 30 to 60  minutes, if you are able to do this.  2. Commitment to wise food choices. Aim for half of your  food intake to be vegetable and fruit, one quarter starchy foods, and one quarter protein. Try to eat on a regular schedule  3 meals per day, snacking between meals should be limited to vegetables or fruits or small portions of nuts. 64 ounces of water per day is generally recommended, unless you have specific health conditions, like heart failure or kidney failure where you will need to limit fluid intake.  3. Commitment to sufficient and a  good quality of physical and mental rest daily, generally between 6 to 8 hours per day.  WITH PERSISTANCE AND PERSEVERANCE, THE IMPOSSIBLE , BECOMES THE NORM!  Thanks for choosing [facility], we consider it a privelige to serve you.

## 2015-06-28 NOTE — Progress Notes (Signed)
Subjective:    Patient ID: Martha Montgomery, female    DOB: 04-17-1981, 34 y.o.   MRN: [id number]  HPI   Martha Montgomery     MRN: [id number]      DOB: 06/13/1981   HPI Martha Montgomery is here for follow up and re-evaluation of chronic medical conditions, medication management and review of any available recent lab and radiology data.  Preventive health is updated, specifically  Cancer screening and Immunization.  Needs flu vaccine and depo provera script Questions or concerns regarding consultations or procedures which the PT has had in the interim are  Addressed.She continues to see neurology regulalrly, however due to deterioration in mental health, intends to resume therapy through mental health. Now separated , and has unresolved issues with this The PT denies any adverse reactions to current medications since the last visit.    ROS Denies recent fever or chills. Denies sinus pressure, nasal congestion, ear pain or sore throat. Denies chest congestion, productive cough or wheezing. Denies chest pains, palpitations and leg swelling Denies abdominal pain, nausea, vomiting,diarrhea or constipation.   Denies dysuria, frequency, hesitancy or incontinence. Denies joint pain, swelling and limitation in mobility. Denies uncontrolled  headaches, seizures, numbness, or tingling. . Denies skin break down or rash.   PE  BP 125/80 mmHg  Pulse 88  Temp(Src) 98.8 F (37.1 C)  Resp 14  Wt 149 lb (67.586 kg)  SpO2 98%  Patient alert and oriented and in no cardiopulmonary distress.  HEENT: No facial asymmetry, EOMI,   oropharynx pink and moist.  Neck supple no JVD, no mass.  Chest: Clear to auscultation bilaterally.  CVS: S1, S2 no murmurs, no S3.Regular rate.  ABD: Soft non tender.   Ext: No edema  MS: Adequate ROM spine, shoulders, hips and knees.  Skin: Intact, no ulcerations or rash noted.  Psych: Good eye contact, flat affect. Memory intact not anxious but  depressed  appearing.  CNS: CN 2-12 intact, power,  normal throughout.no focal deficits noted.   Assessment & Plan   Anxiety and depression Uncontrolled, though not suicidal or homicidal. Resume medication and refer for therapy F/u in 6 to 8 weeks  Migraine with aura Controlled on current medication and treated by neurology  HTN (hypertension), benign Controlled, no change in medication DASH diet and commitment to daily physical activity for a minimum of 30 minutes discussed and encouraged, as a part of hypertension management. The importance of attaining a healthy weight is also discussed.  BP/Weight 06/28/2015 05/30/2014 02/09/2014 10/04/2013 10/03/2013 [id number]  Systolic BP 0000000 [id number] -  Diastolic BP 80 82 86 82 73 70 -  Wt. (Lbs) 149 156.12 149.2 140.08 140 - 140.65  BMI 27.25 28.55 27.28 24.82 24.81 - 24.92        Allergic rhinitis No current flare, continue current management  Post-surgical hypothyroidism Controlled, no change in medication   Need for prophylactic vaccination and inoculation against influenza After obtaining informed consent, the vaccine is  administered by CMA.   Encounter for other contraceptive management script provided at visit for depo provera, which she self administers  Overweight (BMI 25.0-29.9) improved. Patient re-educated about  the importance of commitment to a  minimum of 150 minutes of exercise per week.  The importance of healthy food choices with portion control discussed. Encouraged to start a food diary, count calories and to consider  joining a support group. Sample diet sheets offered. Goals set by the patient for the  next several months.   Weight /BMI 06/28/2015 05/30/2014 02/09/2014  WEIGHT 149 lb 156 lb 1.9 oz 149 lb 3.2 oz  HEIGHT 5\' 2"  5\' 2"  5\' 2"   BMI 27.25 kg/m2 28.55 kg/m2 27.28 kg/m2    Current exercise per week 60  minutes.       Review of Systems     Objective:   Physical Exam         Assessment & Plan:

## 2015-06-29 DIAGNOSIS — Z114 Encounter for screening for human immunodeficiency virus [HIV]: Secondary | ICD-10-CM | POA: Diagnosis not present

## 2015-06-29 DIAGNOSIS — E039 Hypothyroidism, unspecified: Secondary | ICD-10-CM | POA: Diagnosis not present

## 2015-06-29 DIAGNOSIS — E559 Vitamin D deficiency, unspecified: Secondary | ICD-10-CM | POA: Diagnosis not present

## 2015-06-29 DIAGNOSIS — Z1159 Encounter for screening for other viral diseases: Secondary | ICD-10-CM | POA: Diagnosis not present

## 2015-06-29 DIAGNOSIS — F341 Dysthymic disorder: Secondary | ICD-10-CM | POA: Diagnosis not present

## 2015-06-29 DIAGNOSIS — F418 Other specified anxiety disorders: Secondary | ICD-10-CM | POA: Diagnosis not present

## 2015-06-29 DIAGNOSIS — Z113 Encounter for screening for infections with a predominantly sexual mode of transmission: Secondary | ICD-10-CM | POA: Diagnosis not present

## 2015-06-29 DIAGNOSIS — I10 Essential (primary) hypertension: Secondary | ICD-10-CM | POA: Diagnosis not present

## 2015-06-29 LAB — CBC
HCT: 40.7 % (ref 36.0–46.0)
Hemoglobin: 13.4 g/dL (ref 12.0–15.0)
MCH: 30.8 pg (ref 26.0–34.0)
MCHC: 32.9 g/dL (ref 30.0–36.0)
MCV: 93.6 fL (ref 78.0–100.0)
MPV: 10.1 fL (ref 8.6–12.4)
PLATELETS: 391 10*3/uL (ref 150–400)
RBC: 4.35 MIL/uL (ref 3.87–5.11)
RDW: 14.3 % (ref 11.5–15.5)
WBC: 5 10*3/uL (ref 4.0–10.5)

## 2015-06-30 ENCOUNTER — Encounter: Payer: Self-pay | Admitting: Family Medicine

## 2015-06-30 DIAGNOSIS — E669 Obesity, unspecified: Secondary | ICD-10-CM | POA: Insufficient documentation

## 2015-06-30 LAB — LIPID PANEL
CHOL/HDL RATIO: 3.4 ratio (ref <=5.0)
Cholesterol: 196 mg/dL (ref 125–200)
HDL: 58 mg/dL (ref >=46)
LDL CALC: 122 mg/dL (ref <130)
TRIGLYCERIDES: 82 mg/dL (ref <150)
VLDL: 16 mg/dL (ref <30)

## 2015-06-30 LAB — COMPREHENSIVE METABOLIC PANEL
ALBUMIN: 4 g/dL (ref 3.6–5.1)
ALT: 11 U/L (ref 6–29)
AST: 16 U/L (ref 10–30)
Alkaline Phosphatase: 39 U/L (ref 33–115)
BILIRUBIN TOTAL: 0.5 mg/dL (ref 0.2–1.2)
BUN: 6 mg/dL — ABNORMAL LOW (ref 7–25)
CALCIUM: 8.6 mg/dL (ref 8.6–10.2)
CHLORIDE: 104 mmol/L (ref 98–110)
CO2: 23 mmol/L (ref 20–31)
Creat: 0.8 mg/dL (ref 0.50–1.10)
GLUCOSE: 78 mg/dL (ref 65–99)
POTASSIUM: 3.7 mmol/L (ref 3.5–5.3)
Sodium: 139 mmol/L (ref 135–146)
Total Protein: 7.1 g/dL (ref 6.1–8.1)

## 2015-06-30 LAB — TSH: TSH: 40.815 u[IU]/mL — ABNORMAL HIGH (ref 0.350–4.500)

## 2015-06-30 LAB — VITAMIN D 25 HYDROXY (VIT D DEFICIENCY, FRACTURES): Vit D, 25-Hydroxy: 13 ng/mL — ABNORMAL LOW (ref 30–100)

## 2015-06-30 NOTE — Assessment & Plan Note (Signed)
improved. Patient re-educated about  the importance of commitment to a  minimum of 150 minutes of exercise per week.  The importance of healthy food choices with portion control discussed. Encouraged to start a food diary, count calories and to consider  joining a support group. Sample diet sheets offered. Goals set by the patient for the next several months.   Weight /BMI 06/28/2015 05/30/2014 02/09/2014  WEIGHT 149 lb 156 lb 1.9 oz 149 lb 3.2 oz  HEIGHT 5\' 2"  5\' 2"  5\' 2"   BMI 27.25 kg/m2 28.55 kg/m2 27.28 kg/m2    Current exercise per week 60  minutes.

## 2015-06-30 NOTE — Assessment & Plan Note (Signed)
Controlled on current medication and treated by neurology

## 2015-06-30 NOTE — Assessment & Plan Note (Signed)
Uncontrolled, though not suicidal or homicidal. Resume medication and refer for therapy F/u in 6 to 8 weeks

## 2015-06-30 NOTE — Assessment & Plan Note (Signed)
Controlled, no change in medication  

## 2015-06-30 NOTE — Assessment & Plan Note (Signed)
script provided at visit for depo provera, which she self administers

## 2015-06-30 NOTE — Assessment & Plan Note (Signed)
After obtaining informed consent, the vaccine is  administered by CMA  

## 2015-06-30 NOTE — Assessment & Plan Note (Signed)
No current flare, continue current management

## 2015-06-30 NOTE — Assessment & Plan Note (Signed)
Controlled, no change in medication DASH diet and commitment to daily physical activity for a minimum of 30 minutes discussed and encouraged, as a part of hypertension management. The importance of attaining a healthy weight is also discussed.  BP/Weight 06/28/2015 05/30/2014 02/09/2014 10/04/2013 10/03/2013 [id number]  Systolic BP 0000000 [id number] -  Diastolic BP 80 82 86 82 73 70 -  Wt. (Lbs) 149 156.12 149.2 140.08 140 - 140.65  BMI 27.25 28.55 27.28 24.82 24.81 - 24.92

## 2015-07-02 ENCOUNTER — Telehealth: Payer: Self-pay | Admitting: Family Medicine

## 2015-07-02 LAB — HIV ANTIBODY (ROUTINE TESTING W REFLEX): HIV: NONREACTIVE

## 2015-07-03 ENCOUNTER — Other Ambulatory Visit: Payer: Self-pay | Admitting: Family Medicine

## 2015-07-03 NOTE — Telephone Encounter (Signed)
Opened in Error.

## 2015-07-06 ENCOUNTER — Other Ambulatory Visit: Payer: Self-pay

## 2015-07-06 ENCOUNTER — Telehealth: Payer: Self-pay | Admitting: Family Medicine

## 2015-07-06 ENCOUNTER — Other Ambulatory Visit: Payer: Self-pay | Admitting: Family Medicine

## 2015-07-06 DIAGNOSIS — E890 Postprocedural hypothyroidism: Secondary | ICD-10-CM

## 2015-07-06 MED ORDER — LEVOTHYROXINE SODIUM 175 MCG PO TABS: 175.0000 ug | ORAL_TABLET | Freq: Every day | ORAL | Status: DC

## 2015-07-06 NOTE — Telephone Encounter (Signed)
Called and left message for patient to return call.  

## 2015-07-06 NOTE — Telephone Encounter (Signed)
I had sent a msg that I would prescribe vit D capsules , but this is flagging as an allergy, so  She needs to be encouraged to increase intake of foods with vit D fortification , plain OTC vitD3 1000 IU OTC daily also can be taken, her allergy flagged is to soybean oil, and the weekly capsule is in an oil prep,so I think that is the problem  PLS see thyroid note in result note, thanks

## 2015-07-06 NOTE — Addendum Note (Signed)
Addended by: Denman George B on: 07/06/2015 10:49 AM   Modules accepted: Orders

## 2015-07-06 NOTE — Telephone Encounter (Signed)
Patient aware.  Will send info on dietary.

## 2015-07-11 ENCOUNTER — Telehealth: Payer: Self-pay | Admitting: Family Medicine

## 2015-07-11 NOTE — Telephone Encounter (Signed)
Patient is calling stating that the medication mirtazapine (REMERON) 7.5 MG tablet  Is causing her to have crazy dreams at night, it is causing her Blood Pressure to go up, she is asking if this medication can be changed, please advise?

## 2015-07-11 NOTE — Telephone Encounter (Signed)
Having some side effects with the remeron causing crazy dreams and making her bp elevated. Is there anything else she can try?

## 2015-07-11 NOTE — Telephone Encounter (Signed)
Advise stop remerron, when is [facility] appt [facility] , needs to keep that, if sleep is her main pronb , in the interim willig to send restoril 15 mg #30 refill 1 as geenrally well tolerated pls make changes if she wants this

## 2015-07-13 NOTE — Telephone Encounter (Signed)
Called patient and left message for them to return call at the office   

## 2015-07-17 NOTE — Telephone Encounter (Signed)
Called patient and left message for them to return call at the office   

## 2015-07-24 NOTE — Telephone Encounter (Signed)
Called patient and left message for them to return call at the office   

## 2015-07-26 DIAGNOSIS — F329 Major depressive disorder, single episode, unspecified: Secondary | ICD-10-CM

## 2015-07-26 DIAGNOSIS — F419 Anxiety disorder, unspecified: Principal | ICD-10-CM

## 2015-07-26 DIAGNOSIS — F32A Depression, unspecified: Secondary | ICD-10-CM

## 2015-07-26 MED ORDER — FLUOXETINE HCL 20 MG PO TABS: 20.0000 mg | ORAL_TABLET | Freq: Every day | ORAL | Status: DC

## 2015-07-26 NOTE — Telephone Encounter (Signed)
Add fluoxetine 20 mg daily x 2 month, She needs to make f/u contact with [facility] also for her appt

## 2015-07-26 NOTE — Telephone Encounter (Signed)
Patient aware and med sent  

## 2015-07-26 NOTE — Addendum Note (Signed)
Addended by: Eual Fines on: 07/26/2015 03:26 PM   Modules accepted: Orders, Medications

## 2015-07-26 NOTE — Telephone Encounter (Signed)
Was just recently able to contact patient- her main problem isn't sleep, its depression. Haven't heard anything from [facility].Hanley Seamen her the number to call and wants something called in for depression in the place of remeron. Please advise

## 2015-07-27 ENCOUNTER — Other Ambulatory Visit: Payer: Self-pay

## 2015-07-27 DIAGNOSIS — F329 Major depressive disorder, single episode, unspecified: Secondary | ICD-10-CM

## 2015-07-27 DIAGNOSIS — F419 Anxiety disorder, unspecified: Principal | ICD-10-CM

## 2015-08-06 ENCOUNTER — Other Ambulatory Visit: Payer: Self-pay

## 2015-08-06 MED ORDER — LEVOTHYROXINE SODIUM 175 MCG PO TABS: 175.0000 ug | ORAL_TABLET | Freq: Every day | ORAL | Status: DC

## 2015-08-06 MED ORDER — FLUOXETINE HCL 20 MG PO TABS: 20.0000 mg | ORAL_TABLET | Freq: Every day | ORAL | Status: DC

## 2015-08-06 MED ORDER — METOPROLOL TARTRATE 50 MG PO TABS: 50.0000 mg | ORAL_TABLET | Freq: Two times a day (BID) | ORAL | Status: DC

## 2015-08-14 ENCOUNTER — Telehealth (HOSPITAL_COMMUNITY): Payer: Self-pay | Admitting: *Deleted

## 2015-08-31 DIAGNOSIS — M1990 Unspecified osteoarthritis, unspecified site: Secondary | ICD-10-CM | POA: Diagnosis not present

## 2015-08-31 DIAGNOSIS — K219 Gastro-esophageal reflux disease without esophagitis: Secondary | ICD-10-CM | POA: Diagnosis not present

## 2015-08-31 DIAGNOSIS — G43719 Chronic migraine without aura, intractable, without status migrainosus: Secondary | ICD-10-CM | POA: Diagnosis not present

## 2015-09-08 NOTE — Progress Notes (Signed)
REVIEWED-NO ADDITIONAL RECOMMENDATIONS. 

## 2015-09-19 ENCOUNTER — Ambulatory Visit (HOSPITAL_COMMUNITY): Payer: Self-pay | Admitting: Psychiatry

## 2015-09-26 ENCOUNTER — Ambulatory Visit (HOSPITAL_COMMUNITY): Payer: Self-pay | Admitting: Psychiatry

## 2015-09-27 ENCOUNTER — Encounter: Payer: Medicare Other | Admitting: Family Medicine

## 2015-10-23 ENCOUNTER — Ambulatory Visit (HOSPITAL_COMMUNITY): Payer: Self-pay | Admitting: Psychiatry

## 2015-10-23 ENCOUNTER — Encounter (HOSPITAL_COMMUNITY): Payer: Self-pay | Admitting: *Deleted

## 2015-10-24 ENCOUNTER — Other Ambulatory Visit: Payer: Self-pay | Admitting: Family Medicine

## 2015-11-16 DIAGNOSIS — G43711 Chronic migraine without aura, intractable, with status migrainosus: Secondary | ICD-10-CM | POA: Diagnosis not present

## 2015-11-16 DIAGNOSIS — M1990 Unspecified osteoarthritis, unspecified site: Secondary | ICD-10-CM | POA: Diagnosis not present

## 2015-11-16 DIAGNOSIS — K219 Gastro-esophageal reflux disease without esophagitis: Secondary | ICD-10-CM | POA: Diagnosis not present

## 2015-12-06 ENCOUNTER — Other Ambulatory Visit: Payer: Self-pay | Admitting: Family Medicine

## 2015-12-19 ENCOUNTER — Ambulatory Visit (INDEPENDENT_AMBULATORY_CARE_PROVIDER_SITE_OTHER): Payer: Medicare Other | Admitting: Family Medicine

## 2015-12-19 ENCOUNTER — Encounter: Payer: Self-pay | Admitting: Family Medicine

## 2015-12-19 VITALS — BP 140/88 | HR 100 | Resp 18 | Ht 62.0 in | Wt 167.0 lb

## 2015-12-19 DIAGNOSIS — L309 Dermatitis, unspecified: Secondary | ICD-10-CM | POA: Diagnosis not present

## 2015-12-19 DIAGNOSIS — Z308 Encounter for other contraceptive management: Secondary | ICD-10-CM | POA: Diagnosis not present

## 2015-12-19 DIAGNOSIS — E890 Postprocedural hypothyroidism: Secondary | ICD-10-CM

## 2015-12-19 DIAGNOSIS — E669 Obesity, unspecified: Secondary | ICD-10-CM

## 2015-12-19 DIAGNOSIS — I10 Essential (primary) hypertension: Secondary | ICD-10-CM | POA: Diagnosis not present

## 2015-12-19 DIAGNOSIS — F329 Major depressive disorder, single episode, unspecified: Secondary | ICD-10-CM

## 2015-12-19 DIAGNOSIS — F418 Other specified anxiety disorders: Secondary | ICD-10-CM

## 2015-12-19 DIAGNOSIS — F419 Anxiety disorder, unspecified: Secondary | ICD-10-CM

## 2015-12-19 DIAGNOSIS — G43109 Migraine with aura, not intractable, without status migrainosus: Secondary | ICD-10-CM

## 2015-12-19 LAB — POCT URINE PREGNANCY: PREG TEST UR: NEGATIVE

## 2015-12-19 MED ORDER — MEDROXYPROGESTERONE ACETATE 150 MG/ML IM SUSP
150.0000 mg | Freq: Once | INTRAMUSCULAR | Status: AC
  Administered 2015-12-19: 150 mg via INTRAMUSCULAR

## 2015-12-19 MED ORDER — METHYLPREDNISOLONE ACETATE 80 MG/ML IJ SUSP
80.0000 mg | Freq: Once | INTRAMUSCULAR | Status: AC
  Administered 2015-12-19: 80 mg via INTRAMUSCULAR

## 2015-12-19 MED ORDER — PREDNISONE 5 MG (21) PO TBPK: 5.0000 mg | ORAL_TABLET | ORAL | Status: DC

## 2015-12-19 MED ORDER — AMLODIPINE BESYLATE 2.5 MG PO TABS: 2.5000 mg | ORAL_TABLET | Freq: Every day | ORAL | Status: DC

## 2015-12-19 MED ORDER — HYDROXYZINE HCL 10 MG PO TABS: 10.0000 mg | ORAL_TABLET | Freq: Three times a day (TID) | ORAL | Status: DC | PRN

## 2015-12-19 NOTE — Progress Notes (Signed)
Subjective:    Patient ID: Martha Montgomery, female    DOB: 09/12/1980, 35 y.o.   MRN: [id number]  HPI    Martha Montgomery     MRN: [id number]      DOB: 1981/04/23   HPI [name] is here for follow up and re-evaluation of chronic medical conditions, medication management and review of any available recent lab and radiology data.  Preventive health is updated, specifically  Cancer screening and Immunization.   Questions or concerns regarding consultations or procedures which the PT has had in the interim are  addressed. The PT denies any adverse reactions to current medications since the last visit.  C/o excess itch and rash over body, no new products or foods, no other family member affected Needs to resume depo medrol  ROS Denies recent fever or chills. Denies sinus pressure, nasal congestion, ear pain or sore throat. Denies chest congestion, productive cough or wheezing. Denies chest pains, palpitations and leg swelling Denies abdominal pain, nausea, vomiting,diarrhea or constipation.   Denies dysuria, frequency, hesitancy or incontinence. Denies joint pain, swelling and limitation in mobility. Denies headaches, seizures, numbness, or tingling. Denies  Uncontrolled  depression, anxiety or insomnia.    PE  BP 140/88 mmHg  Pulse 100  Resp 18  Ht 5\' 2"  (1.575 m)  Wt 167 lb (75.751 kg)  BMI 30.54 kg/m2  SpO2 100%  Patient alert and oriented and in no cardiopulmonary distress.  HEENT: No facial asymmetry, EOMI,   oropharynx pink and moist.  Neck supple no JVD, no mass.  Chest: Clear to auscultation bilaterally.  CVS: S1, S2 no murmurs, no S3.Regular rate.  ABD: Soft non tender.   Ext: No edema  MS: Adequate ROM spine, shoulders, hips and knees.  Skin: Erythematous macular rash on trunk and extremities.  Psych: Good eye contact, normal affect. Memory intact not anxious or depressed appearing.  CNS: CN 2-12 intact, power,  normal throughout.no focal deficits  noted.   Assessment & Plan  Dermatitis Acute onset of generalized  Pruritic rash, depo medrol in office followed by oral prednisone and hydroxyzine. No evidence of bacterial superinfection  Post-surgical hypothyroidism rept lab shows under correction , dose increase and rept in 6 to 8 weeks  Encounter for other contraceptive management Urine pregnancy test negative, depo provera administered at visit  HTN (hypertension), benign Uncontrolled , add amlodipine DASH diet and commitment to daily physical activity for a minimum of 30 minutes discussed and encouraged, as a part of hypertension management. The importance of attaining a healthy weight is also discussed.  BP/Weight 12/19/2015 06/28/2015 05/30/2014 02/09/2014 10/04/2013 10/03/2013 [id number]  Systolic BP [id number] 0000000 [id number]  Diastolic BP 88 80 82 86 82 73 91  Wt. (Lbs) 167 149 156.12 149.2 140.08 140 140.65  BMI 30.54 27.25 28.55 27.28 24.82 24.81 24.92        Obesity (BMI 30.0-34.9) Deteriorated. Patient re-educated about  the importance of commitment to a  minimum of 150 minutes of exercise per week.  The importance of healthy food choices with portion control discussed. Encouraged to start a food diary, count calories and to consider  joining a support group. Sample diet sheets offered. Goals set by the patient for the next several months.   Weight /BMI 12/19/2015 06/28/2015 05/30/2014  WEIGHT 167 lb 149 lb 156 lb 1.9 oz  HEIGHT 5\' 2"  5\' 2"  5\' 2"   BMI 30.54 kg/m2 27.25 kg/m2 28.55 kg/m2    Current exercise per week  60 minutes.   Anxiety and depression Controlled, no change in medication Needs to re establish with psych  Migraine with aura Controlled and managed by neurology     Review of Systems     Objective:   Physical Exam        Assessment & Plan:

## 2015-12-19 NOTE — Patient Instructions (Addendum)
Annual wellness in 6 to 8 weeks, call if you need me sooner  Depo medrol  In office, 6 day course of prednisone and hydroxyzine sent for rash  Depo Provera in office today if not pregnant  Additional medication amlodipine one every day at same tiime for blood pressure  LABS TODAY, tSH, chem 7  Start daily vitD 3 800 IU which is OTC

## 2015-12-20 ENCOUNTER — Telehealth: Payer: Self-pay

## 2015-12-20 ENCOUNTER — Other Ambulatory Visit: Payer: Self-pay | Admitting: Family Medicine

## 2015-12-20 DIAGNOSIS — I10 Essential (primary) hypertension: Secondary | ICD-10-CM

## 2015-12-20 DIAGNOSIS — E890 Postprocedural hypothyroidism: Secondary | ICD-10-CM

## 2015-12-20 LAB — TSH: TSH: 36.77 m[IU]/L — AB

## 2015-12-20 LAB — BASIC METABOLIC PANEL
BUN: 8 mg/dL (ref 7–25)
CALCIUM: 9.2 mg/dL (ref 8.6–10.2)
CO2: 25 mmol/L (ref 20–31)
CREATININE: 0.84 mg/dL (ref 0.50–1.10)
Chloride: 104 mmol/L (ref 98–110)
GLUCOSE: 90 mg/dL (ref 65–99)
Potassium: 4.4 mmol/L (ref 3.5–5.3)
SODIUM: 137 mmol/L (ref 135–146)

## 2015-12-20 MED ORDER — LEVOTHYROXINE SODIUM 200 MCG PO TABS
200.0000 ug | ORAL_TABLET | Freq: Every day | ORAL | Status: DC
Start: 2015-12-20 — End: 2016-03-07

## 2015-12-20 NOTE — Telephone Encounter (Signed)
-----   Message from Fayrene Helper, MD sent at 12/20/2015  7:32 AM EDT ----- Needs inc dose of synthroid, rept TSH in august 2 to 5 days before f/u, pls send in entered med

## 2015-12-20 NOTE — Telephone Encounter (Signed)
Repeat labs ordered and medication change sent to pharmacy

## 2015-12-24 NOTE — Assessment & Plan Note (Signed)
Controlled, no change in medication Needs to re establish with psych

## 2015-12-24 NOTE — Assessment & Plan Note (Signed)
Acute onset of generalized  Pruritic rash, depo medrol in office followed by oral prednisone and hydroxyzine. No evidence of bacterial superinfection

## 2015-12-24 NOTE — Assessment & Plan Note (Signed)
rept lab shows under correction , dose increase and rept in 6 to 8 weeks

## 2015-12-24 NOTE — Assessment & Plan Note (Signed)
Controlled and managed by neurology 

## 2015-12-24 NOTE — Assessment & Plan Note (Signed)
Uncontrolled , add amlodipine DASH diet and commitment to daily physical activity for a minimum of 30 minutes discussed and encouraged, as a part of hypertension management. The importance of attaining a healthy weight is also discussed.  BP/Weight 12/19/2015 06/28/2015 05/30/2014 02/09/2014 10/04/2013 10/03/2013 [id number]  Systolic BP XX123456 0000000 [id number]  Diastolic BP 88 80 82 86 82 73 91  Wt. (Lbs) 167 149 156.12 149.2 140.08 140 140.65  BMI 30.54 27.25 28.55 27.28 24.82 24.81 24.92

## 2015-12-24 NOTE — Assessment & Plan Note (Signed)
Urine pregnancy test negative, depo provera administered at visit

## 2015-12-24 NOTE — Assessment & Plan Note (Signed)
Deteriorated. Patient re-educated about  the importance of commitment to a  minimum of 150 minutes of exercise per week.  The importance of healthy food choices with portion control discussed. Encouraged to start a food diary, count calories and to consider  joining a support group. Sample diet sheets offered. Goals set by the patient for the next several months.   Weight /BMI 12/19/2015 06/28/2015 05/30/2014  WEIGHT 167 lb 149 lb 156 lb 1.9 oz  HEIGHT 5\' 2"  5\' 2"  5\' 2"   BMI 30.54 kg/m2 27.25 kg/m2 28.55 kg/m2    Current exercise per week 60 minutes.

## 2015-12-27 ENCOUNTER — Encounter: Payer: Self-pay | Admitting: Family Medicine

## 2016-01-04 ENCOUNTER — Emergency Department (HOSPITAL_COMMUNITY)
Admission: EM | Admit: 2016-01-04 | Discharge: 2016-01-04 | Disposition: A | Discharge status: Home / Self Care | Payer: Medicare Other | Attending: Emergency Medicine | Admitting: Emergency Medicine

## 2016-01-04 ENCOUNTER — Encounter (HOSPITAL_COMMUNITY): Payer: Self-pay

## 2016-01-04 DIAGNOSIS — L42 Pityriasis rosea: Secondary | ICD-10-CM

## 2016-01-04 DIAGNOSIS — F329 Major depressive disorder, single episode, unspecified: Secondary | ICD-10-CM | POA: Insufficient documentation

## 2016-01-04 DIAGNOSIS — M1990 Unspecified osteoarthritis, unspecified site: Secondary | ICD-10-CM | POA: Diagnosis not present

## 2016-01-04 DIAGNOSIS — Z87891 Personal history of nicotine dependence: Secondary | ICD-10-CM | POA: Diagnosis not present

## 2016-01-04 DIAGNOSIS — E039 Hypothyroidism, unspecified: Secondary | ICD-10-CM | POA: Insufficient documentation

## 2016-01-04 DIAGNOSIS — Z79899 Other long term (current) drug therapy: Secondary | ICD-10-CM | POA: Diagnosis not present

## 2016-01-04 DIAGNOSIS — I10 Essential (primary) hypertension: Secondary | ICD-10-CM | POA: Insufficient documentation

## 2016-01-04 DIAGNOSIS — R21 Rash and other nonspecific skin eruption: Secondary | ICD-10-CM | POA: Diagnosis present

## 2016-01-04 MED ORDER — HYDROXYZINE PAMOATE 25 MG PO CAPS: 25.0000 mg | ORAL_CAPSULE | Freq: Four times a day (QID) | ORAL | Status: DC | PRN

## 2016-01-04 MED ORDER — DEXAMETHASONE SODIUM PHOSPHATE 4 MG/ML IJ SOLN
8.0000 mg | Freq: Once | INTRAMUSCULAR | Status: AC
  Administered 2016-01-04: 8 mg via INTRAMUSCULAR

## 2016-01-04 MED ORDER — DEXAMETHASONE SODIUM PHOSPHATE 4 MG/ML IJ SOLN
INTRAMUSCULAR | Status: AC
  Filled 2016-01-04: qty 2

## 2016-01-04 MED ORDER — DEXAMETHASONE 4 MG PO TABS: 4.0000 mg | ORAL_TABLET | Freq: Two times a day (BID) | ORAL | Status: DC

## 2016-01-04 MED ORDER — TRIAMCINOLONE ACETONIDE 0.1 % EX CREA: 1.0000 "application " | TOPICAL_CREAM | Freq: Two times a day (BID) | CUTANEOUS | Status: DC

## 2016-01-04 NOTE — ED Notes (Signed)
Pt states she has been dealing with a progressive rash for about a month, has been on prednisone x 1, and is taking hydroxyzine for the itching.

## 2016-01-04 NOTE — Discharge Instructions (Signed)
Please use Decadron 2 times daily with a meal on. Use Vistaril every 6 hours as needed for itching. This medication may cause drowsiness, please do not drink alcohol, drive a vehicle, or operate machinery when taking this medication. Please apply triamcinolone on to rash areas 2 times daily on. Please see Dr. Marguerite Olea for dermatology evaluation if not improving. Pityriasis Rosea Pityriasis rosea is a rash that usually appears on the trunk of the body. It may also appear on the upper arms and upper legs. It usually begins as a single patch, and then more patches begin to develop. The rash may cause mild itching, but it normally does not cause other problems. It usually goes away without treatment. However, it may take weeks or months for the rash to go away completely. CAUSES The cause of this condition is not known. The condition does not spread from person to person (is noncontagious). RISK FACTORS This condition is more likely to develop in young adults and children. It is most common in the spring and fall. SYMPTOMS The main symptom of this condition is a rash.  The rash usually begins with a single oval patch that is larger than the ones that follow. This is called a herald patch. It generally appears a week or more before the rest of the rash appears.  When more patches start to develop, they spread quickly on the trunk, back, and arms. These patches are smaller than the first one.  The patches that make up the rash are usually oval-shaped and pink or red in color. They are usually flat, but they may sometimes be raised so that they can be felt with a finger. They may also be finely crinkled and have a scaly ring around the edge.  The rash does not typically appear on areas of the skin that are exposed to the sun. Most people who have this condition do not have other symptoms, but some have mild itching. In a few cases, a mild headache or body aches may occur before the rash appears and then  go away. DIAGNOSIS Your health care provider may diagnose this condition by doing a physical exam and taking your medical history. To rule out other possible causes for the rash, the health care provider may order blood tests or take a skin sample from the rash to be looked at under a microscope. TREATMENT Usually, treatment is not needed for this condition. The rash will probably go away on its own in 4-8 weeks. In some cases, a health care provider may recommend or prescribe medicine to reduce itching. HOME CARE INSTRUCTIONS  Take medicines only as directed by your health care provider.  Avoid scratching the affected areas of skin.  Do not take hot baths or use a sauna. Use only warm water when bathing or showering. Heat can increase itching. SEEK MEDICAL CARE IF:  Your rash does not go away in 8 weeks.  Your rash gets much worse.  You have a fever.  You have swelling or pain in the rash area.  You have fluid, blood, or pus coming from the rash area.   This information is not intended to replace advice given to you by your health care provider. Make sure you discuss any questions you have with your health care provider.   Document Released: 08/20/2001 Document Revised: 11/28/2014 Document Reviewed: 06/21/2014 Elsevier Interactive Patient Education [employer].

## 2016-01-04 NOTE — ED Provider Notes (Signed)
CSN: [id number]     Arrival date & time 01/04/16  0026 History   First MD Initiated Contact with Patient 01/04/16 0041     Chief Complaint  Patient presents with  . Rash     (Consider location/radiation/quality/duration/timing/severity/associated sxs/prior Treatment) Patient is a 35 y.o. female presenting with rash. The history is provided by the patient.  Rash Location: Abdomen, chest, back, thighs, lower extremities, upper extremities. Quality: itchiness and scaling   Severity:  Moderate Onset quality:  Gradual Duration:  1 month Timing:  Intermittent Progression:  Spreading Chronicity:  Recurrent Context: not medications, not plant contact and not sick contacts   Relieved by:  Nothing Worsened by:  Nothing tried Associated symptoms: no fever, no nausea, no sore throat and no tongue swelling     Past Medical History  Diagnosis Date  . Hypertension x 6 years   . Thyroid goiter   . Gastritis nov. 2011    EGD by Dr. Oneida Alar, negative h pylori  . Hypothyroidism   . GERD (gastroesophageal reflux disease)   . Migraines     "15/month average" (09/28/2013)  . Arthritis     "knees, joints, hands, toes" (09/28/2013)  . Gout   . Anxiety   . Anxiety disorder   . Depression    Past Surgical History  Procedure Laterality Date  . Cesarean section  2008  . Chondroplasty  04/02/2012    Procedure: CHONDROPLASTY;  Surgeon: Carole Civil, MD;  Location: [facility];  Service: Orthopedics;  Laterality: Left;  of left patella  . Colonoscopy  May 2012    Dr. Oneida Alar: normal colon, small internal hemorrhoids  . Upper gi endoscopy    . Total thyroidectomy  09/27/2013  . Incision and drainage abscess  1997; 2005    "under right buttocks; under left arm"  . Thyroidectomy N/A 09/28/2013    Procedure: TOTAL THYROIDECTOMY;  Surgeon: Ascencion Dike, MD;  Location: [facility] OR;  Service: ENT;  Laterality: N/A;   Family History  Problem Relation Age of Onset  . Arthritis Mother   . Migraines Mother   .  Hypertension Mother   . Asthma      family history   . Cancer Maternal Grandmother     lung  . Colon cancer Neg Hx    Social History  Substance Use Topics  . Smoking status: Former Smoker -- 1.00 packs/day for 5 years    Types: Cigarettes    Quit date: 09/11/2010  . Smokeless tobacco: Never Used     Comment: QUIT SMOKING X 3 YEARS AGO  . Alcohol Use: No     Comment:  "have a drink a couple times per yr"   OB History    No data available     Review of Systems  Constitutional: Negative for fever.  HENT: Negative for sore throat.   Gastrointestinal: Negative for nausea.  Skin: Positive for rash.  All other systems reviewed and are negative.     Allergies  Codeine and Soybean-containing drug products  Home Medications   Prior to Admission medications   Medication Sig Start Date End Date Taking? Authorizing Provider  amLODipine (NORVASC) 2.5 MG tablet Take 1 tablet (2.5 mg total) by mouth daily. 12/19/15  Yes Fayrene Helper, MD  FLUoxetine (PROZAC) 20 MG tablet Take 1 tablet (20 mg total) by mouth daily. 08/06/15  Yes Fayrene Helper, MD  gabapentin (NEURONTIN) 300 MG capsule Take 600 mg by mouth at bedtime.   Yes Historical Provider, MD  hydrOXYzine (ATARAX/VISTARIL) 10 MG tablet Take 1 tablet (10 mg total) by mouth 3 (three) times daily as needed. 12/19/15  Yes Fayrene Helper, MD  levothyroxine (SYNTHROID) 200 MCG tablet Take 1 tablet (200 mcg total) by mouth daily before breakfast. 12/20/15  Yes Fayrene Helper, MD  metoprolol (LOPRESSOR) 50 MG tablet TAKE ONE TABLET BY MOUTH TWICE DAILY 10/25/15  Yes Fayrene Helper, MD  mometasone (NASONEX) 50 MCG/ACT nasal spray Place 2 sprays into the nose daily. 05/30/14  Yes Fayrene Helper, MD  topiramate (TOPAMAX) 100 MG tablet Take 100 mg by mouth daily.   Yes Historical Provider, MD  traMADol (ULTRAM) 50 MG tablet Take 50 mg by mouth every 6 (six) hours as needed for moderate pain.   Yes Historical Provider, MD   predniSONE (STERAPRED UNI-PAK 21 TAB) 5 MG (21) TBPK tablet Take 1 tablet (5 mg total) by mouth as directed. Use as directed 12/19/15   Fayrene Helper, MD   BP 136/94 mmHg  Pulse 87  Temp(Src) 98.7 F (37.1 C) (Oral)  Resp 16  Ht 5\' 3"  (1.6 m)  Wt 73.936 kg  BMI 28.88 kg/m2  SpO2 100% Physical Exam  Constitutional: She is oriented to person, place, and time. She appears well-developed and well-nourished.  Non-toxic appearance.  HENT:  Head: Normocephalic.  Right Ear: Tympanic membrane and external ear normal.  Left Ear: Tympanic membrane and external ear normal.  Eyes: EOM and lids are normal. Pupils are equal, round, and reactive to light.  Neck: Normal range of motion. Neck supple. Carotid bruit is not present.  Cardiovascular: Normal rate, regular rhythm, normal heart sounds, intact distal pulses and normal pulses.   Pulmonary/Chest: Breath sounds normal. No respiratory distress.  Abdominal: Soft. Bowel sounds are normal. There is no tenderness. There is no guarding.  Musculoskeletal: Normal range of motion.  Lymphadenopathy:       Head (right side): No submandibular adenopathy present.       Head (left side): No submandibular adenopathy present.    She has no cervical adenopathy.  Neurological: She is alert and oriented to person, place, and time. She has normal strength. No cranial nerve deficit or sensory deficit.  Skin: Skin is warm and dry.  There are oval patches present with scaly rash in the center on. They have a angled pattern is consistent with a Christmas tree on the back, on the abdomen and chest, and also there are multiple similar lesions on the arms and legs on. There is also some increased dryness and patching in the antecubital area and the areas behind the knees. There no red streaks appreciated. No hot areas appreciated. No drainage noted.  Psychiatric: She has a normal mood and affect. Her speech is normal.  Nursing note and vitals reviewed.   ED Course   Procedures (including critical care time) Labs Review Labs Reviewed - No data to display  Imaging Review No results found. I have personally reviewed and evaluated these images and lab results as part of my medical decision-making.   EKG Interpretation None      MDM  Vital signs within normal limits. The rash favors pityriasis rosea. Patient will be treated with Decadron, Vistaril, and triamcinolone. The patient is to follow-up with Dr. Marguerite Olea dermatology if not improving. Patient is in agreement with this discharge plan.    Final diagnoses:  Pityriasis rosea    **I have reviewed nursing notes, vital signs, and all appropriate lab and imaging results for  this patient.Lily Kocher, PA-C 01/04/16 Ewa Gentry, DO 01/04/16 [id number]

## 2016-01-15 DIAGNOSIS — K219 Gastro-esophageal reflux disease without esophagitis: Secondary | ICD-10-CM | POA: Diagnosis not present

## 2016-01-15 DIAGNOSIS — F329 Major depressive disorder, single episode, unspecified: Secondary | ICD-10-CM | POA: Diagnosis not present

## 2016-01-15 DIAGNOSIS — M1990 Unspecified osteoarthritis, unspecified site: Secondary | ICD-10-CM | POA: Diagnosis not present

## 2016-01-15 DIAGNOSIS — M79606 Pain in leg, unspecified: Secondary | ICD-10-CM | POA: Diagnosis not present

## 2016-01-15 DIAGNOSIS — Z79899 Other long term (current) drug therapy: Secondary | ICD-10-CM | POA: Diagnosis not present

## 2016-01-16 ENCOUNTER — Encounter: Payer: Medicare Other | Admitting: Family Medicine

## 2016-01-30 ENCOUNTER — Telehealth: Payer: Self-pay

## 2016-01-30 DIAGNOSIS — L282 Other prurigo: Secondary | ICD-10-CM

## 2016-01-30 DIAGNOSIS — L210 Seborrhea capitis: Secondary | ICD-10-CM

## 2016-01-30 MED ORDER — HYDROXYZINE HCL 25 MG PO TABS: 25.0000 mg | ORAL_TABLET | Freq: Three times a day (TID) | ORAL | Status: DC

## 2016-01-30 NOTE — Telephone Encounter (Signed)
She was advised to see dermatology when she went to ED, thought that had happened, I am referring her , she needs to call in am ,  For appt info, and I have sent in higher dose of hydroxyzine, with pityriasis the rash and itch can last for a prolonged time unfortunately

## 2016-01-30 NOTE — Telephone Encounter (Signed)
Called and left message for patient to return call.  

## 2016-02-02 NOTE — Telephone Encounter (Signed)
Left message for pt. Already scheduled for dermatology

## 2016-02-04 DIAGNOSIS — L308 Other specified dermatitis: Secondary | ICD-10-CM | POA: Diagnosis not present

## 2016-02-04 DIAGNOSIS — L42 Pityriasis rosea: Secondary | ICD-10-CM | POA: Diagnosis not present

## 2016-02-13 DIAGNOSIS — G43719 Chronic migraine without aura, intractable, without status migrainosus: Secondary | ICD-10-CM | POA: Diagnosis not present

## 2016-02-13 DIAGNOSIS — K219 Gastro-esophageal reflux disease without esophagitis: Secondary | ICD-10-CM | POA: Diagnosis not present

## 2016-02-13 DIAGNOSIS — F329 Major depressive disorder, single episode, unspecified: Secondary | ICD-10-CM | POA: Diagnosis not present

## 2016-02-13 DIAGNOSIS — L42 Pityriasis rosea: Secondary | ICD-10-CM | POA: Diagnosis not present

## 2016-02-13 DIAGNOSIS — M79606 Pain in leg, unspecified: Secondary | ICD-10-CM | POA: Diagnosis not present

## 2016-02-13 DIAGNOSIS — L308 Other specified dermatitis: Secondary | ICD-10-CM | POA: Diagnosis not present

## 2016-02-13 DIAGNOSIS — M1990 Unspecified osteoarthritis, unspecified site: Secondary | ICD-10-CM | POA: Diagnosis not present

## 2016-02-13 DIAGNOSIS — Z79899 Other long term (current) drug therapy: Secondary | ICD-10-CM | POA: Diagnosis not present

## 2016-03-04 DIAGNOSIS — M79642 Pain in left hand: Secondary | ICD-10-CM | POA: Diagnosis not present

## 2016-03-04 DIAGNOSIS — M79641 Pain in right hand: Secondary | ICD-10-CM | POA: Diagnosis not present

## 2016-03-04 DIAGNOSIS — M19071 Primary osteoarthritis, right ankle and foot: Secondary | ICD-10-CM | POA: Diagnosis not present

## 2016-03-04 DIAGNOSIS — R21 Rash and other nonspecific skin eruption: Secondary | ICD-10-CM | POA: Diagnosis not present

## 2016-03-04 DIAGNOSIS — R768 Other specified abnormal immunological findings in serum: Secondary | ICD-10-CM | POA: Diagnosis not present

## 2016-03-04 DIAGNOSIS — M2550 Pain in unspecified joint: Secondary | ICD-10-CM | POA: Diagnosis not present

## 2016-03-04 DIAGNOSIS — M25562 Pain in left knee: Secondary | ICD-10-CM | POA: Diagnosis not present

## 2016-03-04 DIAGNOSIS — R5383 Other fatigue: Secondary | ICD-10-CM | POA: Diagnosis not present

## 2016-03-04 DIAGNOSIS — M19072 Primary osteoarthritis, left ankle and foot: Secondary | ICD-10-CM | POA: Diagnosis not present

## 2016-03-04 DIAGNOSIS — M25561 Pain in right knee: Secondary | ICD-10-CM | POA: Diagnosis not present

## 2016-03-06 ENCOUNTER — Ambulatory Visit (INDEPENDENT_AMBULATORY_CARE_PROVIDER_SITE_OTHER): Payer: Medicare Other | Admitting: Family Medicine

## 2016-03-06 ENCOUNTER — Ambulatory Visit: Payer: Self-pay

## 2016-03-06 ENCOUNTER — Encounter: Payer: Self-pay | Admitting: Family Medicine

## 2016-03-06 VITALS — BP 122/84 | HR 90 | Resp 16 | Ht 63.0 in | Wt 160.0 lb

## 2016-03-06 DIAGNOSIS — F329 Major depressive disorder, single episode, unspecified: Secondary | ICD-10-CM

## 2016-03-06 DIAGNOSIS — G43109 Migraine with aura, not intractable, without status migrainosus: Secondary | ICD-10-CM

## 2016-03-06 DIAGNOSIS — Z308 Encounter for other contraceptive management: Secondary | ICD-10-CM | POA: Diagnosis not present

## 2016-03-06 DIAGNOSIS — F418 Other specified anxiety disorders: Secondary | ICD-10-CM

## 2016-03-06 DIAGNOSIS — R0789 Other chest pain: Secondary | ICD-10-CM

## 2016-03-06 DIAGNOSIS — F419 Anxiety disorder, unspecified: Secondary | ICD-10-CM

## 2016-03-06 DIAGNOSIS — E663 Overweight: Secondary | ICD-10-CM

## 2016-03-06 DIAGNOSIS — E890 Postprocedural hypothyroidism: Secondary | ICD-10-CM

## 2016-03-06 DIAGNOSIS — G478 Other sleep disorders: Secondary | ICD-10-CM

## 2016-03-06 DIAGNOSIS — L309 Dermatitis, unspecified: Secondary | ICD-10-CM | POA: Diagnosis not present

## 2016-03-06 DIAGNOSIS — I10 Essential (primary) hypertension: Secondary | ICD-10-CM | POA: Diagnosis not present

## 2016-03-06 DIAGNOSIS — G4730 Sleep apnea, unspecified: Secondary | ICD-10-CM

## 2016-03-06 LAB — TSH: TSH: 0.24 m[IU]/L — AB

## 2016-03-06 MED ORDER — MEDROXYPROGESTERONE ACETATE 150 MG/ML IM SUSP
150.0000 mg | Freq: Once | INTRAMUSCULAR | Status: AC
  Administered 2016-03-06: 150 mg via INTRAMUSCULAR

## 2016-03-06 NOTE — Patient Instructions (Signed)
Annual wellness in 3 month, call if you need me before   You are referred to cardiology and for a sleep study  TSH today  Please work on good  health habits so that your health will improve. 1. Commitment to daily physical activity for 30 to 60  minutes, if you are able to do this.  2. Commitment to wise food choices. Aim for half of your  food intake to be vegetable and fruit, one quarter starchy foods, and one quarter protein. Try to eat on a regular schedule  3 meals per day, snacking between meals should be limited to vegetables or fruits or small portions of nuts. 64 ounces of water per day is generally recommended, unless you have specific health conditions, like heart failure or kidney failure where you will need to limit fluid intake.  3. Commitment to sufficient and a  good quality of physical and mental rest daily, generally between 6 to 8 hours per day.  WITH PERSISTANCE AND PERSEVERANCE, THE IMPOSSIBLE , BECOMES THE NORM!   Thank you  for choosing [facility]. We consider it a privelige to serve you.  Delivering excellent health care in a caring and  compassionate way is our goal.  Partnering with you,  so that together we can achieve this goal is our strategy.

## 2016-03-07 ENCOUNTER — Other Ambulatory Visit: Payer: Self-pay | Admitting: Family Medicine

## 2016-03-07 ENCOUNTER — Encounter: Payer: Self-pay | Admitting: Family Medicine

## 2016-03-07 DIAGNOSIS — R7989 Other specified abnormal findings of blood chemistry: Secondary | ICD-10-CM

## 2016-03-07 MED ORDER — LEVOTHYROXINE SODIUM 150 MCG PO TABS: 150.0000 ug | ORAL_TABLET | Freq: Every day | ORAL | 3 refills | Status: DC

## 2016-03-07 NOTE — Progress Notes (Signed)
HTN (hypertension), benign Controlled, no change in medication DASH diet and commitment to daily physical activity for a minimum of 30 minutes discussed and encouraged, as a part of hypertension management. The importance of attaining a healthy weight is also discussed.  BP/Weight 03/06/2016 01/04/2016 12/19/2015 06/28/2015 05/30/2014 02/09/2014 Q000111Q  Systolic BP 123XX123 Q000111Q [id number]  Diastolic BP 84 94 88 80 82 86 82  Wt. (Lbs) 160 163 167 149 156.12 149.2 140.08  BMI 28.34 28.88 30.54 27.25 28.55 27.28 24.82       Overweight (BMI 25.0-29.9) Deteriorated. Patient re-educated about  the importance of commitment to a  minimum of 150 minutes of exercise per week.  The importance of healthy food choices with portion control discussed. Encouraged to start a food diary, count calories and to consider  joining a support group. Sample diet sheets offered. Goals set by the patient for the next several months.   Weight /BMI 03/06/2016 01/04/2016 12/19/2015  WEIGHT 160 lb 163 lb 167 lb  HEIGHT 5\' 3"  5\' 3"  5\' 2"   BMI 28.34 kg/m2 28.88 kg/m2 30.54 kg/m2      Dermatitis ongoing problem, now being evaluated for lupus reportedly  Post-surgical hypothyroidism Overcorrected, based on lab obtained after visit, dose reduction and rept in 5 weeks  Anxiety and depression Managed by psych, adequately controlled  Migraine with aura Managed by neurology , adequately controlled  Sleep disorder breathing Chronic fatigue and snoring refer for sleep study  Chest pain, atypical Non s[pcific intermittent chest discomfort wit marginally abn EKG , non specific t wave abnormality, referral to cardiology per pt request. Counseled on  behavior modification to reduce CV risk  Encounter for other contraceptive management Depo provera administered

## 2016-03-07 NOTE — Telephone Encounter (Signed)
-----   Message from Fayrene Helper, MD sent at 03/07/2016  9:04 AM EDT ----- Pls advise d/c current synthroid of 200 mcg daily,  and start dose of 150 mcg daily She will need rept TSh Sept 19 also, pls order and explain importance of her follow through I have entered the lower dose pls send and advise pharmacy also

## 2016-03-08 ENCOUNTER — Encounter: Payer: Self-pay | Admitting: Family Medicine

## 2016-03-08 DIAGNOSIS — G4730 Sleep apnea, unspecified: Secondary | ICD-10-CM | POA: Insufficient documentation

## 2016-03-08 DIAGNOSIS — R0789 Other chest pain: Secondary | ICD-10-CM | POA: Insufficient documentation

## 2016-03-08 NOTE — Assessment & Plan Note (Signed)
Managed by psych, adequately controlled

## 2016-03-08 NOTE — Assessment & Plan Note (Signed)
Deteriorated. Patient re-educated about  the importance of commitment to a  minimum of 150 minutes of exercise per week.  The importance of healthy food choices with portion control discussed. Encouraged to start a food diary, count calories and to consider  joining a support group. Sample diet sheets offered. Goals set by the patient for the next several months.   Weight /BMI 03/06/2016 01/04/2016 12/19/2015  WEIGHT 160 lb 163 lb 167 lb  HEIGHT 5\' 3"  5\' 3"  5\' 2"   BMI 28.34 kg/m2 28.88 kg/m2 30.54 kg/m2

## 2016-03-08 NOTE — Assessment & Plan Note (Signed)
ongoing problem, now being evaluated for lupus reportedly

## 2016-03-08 NOTE — Progress Notes (Signed)
Martha Montgomery     MRN: [id number]      DOB: 08-Apr-1981   HPI [name] is here for follow up and re-evaluation of chronic medical conditions, medication management and review of any available recent lab and radiology data.  Preventive health is updated, specifically  Cancer screening and Immunization.   Recently referred to dermatologu/ rheumatology ion [place] by local dermatology to be evaluated for lupus, as the rash she has appears to be suspicious for this. States she was reviewing med records, has EKG tracings which she had access to for "the first time" which report non specific t wave abnormalities. Denies exertional fatigue , but reports intermittent non specific chest pain, is anxious and wants furhter eval. No other cardiac symptoms, no regular exercise and obese. I counseled re lifestyle changes needed to reduce CV risk and will refer her for cardiology based on her concern and desireThe PT denies any adverse reactions to current medications since the last visit.  C/o chronic fatigue. Is still undergoing evaluation of rash and now lupus is being entertaine   ROS Denies recent fever or chills. Denies sinus pressure, nasal congestion, ear pain or sore throat. Denies chest congestion, productive cough or wheezing. Denies PND, orthopnea, palpitations and leg swelling Denies abdominal pain, nausea, vomiting,diarrhea or constipation.   Denies dysuria, frequency, hesitancy or incontinence. Denies joint pain, swelling and limitation in mobility. Denies headaches, seizures, numbness, or tingling. Denies depression, does have increased anxiety denies  Insomnia.Has chronic fatigue and snores  PE  BP 122/84   Pulse 90   Resp 16   Ht 5\' 3"  (1.6 m)   Wt 160 lb (72.6 kg)   SpO2 99%   BMI 28.34 kg/m   Patient alert and oriented and in no cardiopulmonary distress.  HEENT: No facial asymmetry, EOMI,   oropharynx pink and moist.  Neck supple no JVD, no mass.  Chest: Clear to  auscultation bilaterally.No reproducible chest tenderness  CVS: S1, S2 no murmurs, no S3.Regular rate.  ABD: Soft non tender.   Ext: No edema  MS: Adequate ROM spine, shoulders, hips and knees.  Skin: Intact,  rash noted.  Psych: Good eye contact, normal affect. Memory intact mildly  anxious not depressed appearing.  CNS: CN 2-12 intact, power,  normal throughout.no focal deficits noted.   Assessment & Plan  HTN (hypertension), benign Controlled, no change in medication DASH diet and commitment to daily physical activity for a minimum of 30 minutes discussed and encouraged, as a part of hypertension management. The importance of attaining a healthy weight is also discussed.  BP/Weight 03/06/2016 01/04/2016 12/19/2015 06/28/2015 05/30/2014 02/09/2014 Q000111Q  Systolic BP 123XX123 Q000111Q [id number]  Diastolic BP 84 94 88 80 82 86 82  Wt. (Lbs) 160 163 167 149 156.12 149.2 140.08  BMI 28.34 28.88 30.54 27.25 28.55 27.28 24.82       Overweight (BMI 25.0-29.9) Deteriorated. Patient re-educated about  the importance of commitment to a  minimum of 150 minutes of exercise per week.  The importance of healthy food choices with portion control discussed. Encouraged to start a food diary, count calories and to consider  joining a support group. Sample diet sheets offered. Goals set by the patient for the next several months.   Weight /BMI 03/06/2016 01/04/2016 12/19/2015  WEIGHT 160 lb 163 lb 167 lb  HEIGHT 5\' 3"  5\' 3"  5\' 2"   BMI 28.34 kg/m2 28.88 kg/m2 30.54 kg/m2      Dermatitis ongoing problem,  now being evaluated for lupus reportedly  Post-surgical hypothyroidism Overcorrected, based on lab obtained after visit, dose reduction and rept in 5 weeks  Anxiety and depression Managed by psych, adequately controlled  Migraine with aura Managed by neurology , adequately controlled  Sleep disorder breathing Chronic fatigue and snoring refer for sleep study  Chest pain,  atypical Non s[pcific intermittent chest discomfort wit marginally abn EKG , non specific t wave abnormality, referral to cardiology per pt request. Counseled on  behavior modification to reduce CV risk  Encounter for other contraceptive management Depo provera administered

## 2016-03-08 NOTE — Assessment & Plan Note (Signed)
Overcorrected, based on lab obtained after visit, dose reduction and rept in 5 weeks

## 2016-03-08 NOTE — Assessment & Plan Note (Signed)
Chronic fatigue and snoring refer for sleep study

## 2016-03-08 NOTE — Assessment & Plan Note (Signed)
Depo provera administered 

## 2016-03-08 NOTE — Assessment & Plan Note (Signed)
Non s[pcific intermittent chest discomfort wit marginally abn EKG , non specific t wave abnormality, referral to cardiology per pt request. Counseled on  behavior modification to reduce CV risk

## 2016-03-08 NOTE — Assessment & Plan Note (Signed)
Managed by neurology , adequately controlled

## 2016-03-08 NOTE — Assessment & Plan Note (Signed)
Controlled, no change in medication DASH diet and commitment to daily physical activity for a minimum of 30 minutes discussed and encouraged, as a part of hypertension management. The importance of attaining a healthy weight is also discussed.  BP/Weight 03/06/2016 01/04/2016 12/19/2015 06/28/2015 05/30/2014 02/09/2014 Q000111Q  Systolic BP 123XX123 Q000111Q [id number] 0000000 [id number]  Diastolic BP 84 94 88 80 82 86 82  Wt. (Lbs) 160 163 167 149 156.12 149.2 140.08  BMI 28.34 28.88 30.54 27.25 28.55 27.28 24.82

## 2016-03-11 DIAGNOSIS — L308 Other specified dermatitis: Secondary | ICD-10-CM | POA: Diagnosis not present

## 2016-03-13 DIAGNOSIS — K219 Gastro-esophageal reflux disease without esophagitis: Secondary | ICD-10-CM | POA: Diagnosis not present

## 2016-03-13 DIAGNOSIS — M1990 Unspecified osteoarthritis, unspecified site: Secondary | ICD-10-CM | POA: Diagnosis not present

## 2016-03-13 DIAGNOSIS — G4733 Obstructive sleep apnea (adult) (pediatric): Secondary | ICD-10-CM | POA: Diagnosis not present

## 2016-03-18 DIAGNOSIS — R21 Rash and other nonspecific skin eruption: Secondary | ICD-10-CM | POA: Diagnosis not present

## 2016-03-18 DIAGNOSIS — R5383 Other fatigue: Secondary | ICD-10-CM | POA: Diagnosis not present

## 2016-03-18 DIAGNOSIS — M2550 Pain in unspecified joint: Secondary | ICD-10-CM | POA: Diagnosis not present

## 2016-03-18 DIAGNOSIS — R768 Other specified abnormal immunological findings in serum: Secondary | ICD-10-CM | POA: Diagnosis not present

## 2016-03-19 DIAGNOSIS — K219 Gastro-esophageal reflux disease without esophagitis: Secondary | ICD-10-CM | POA: Diagnosis not present

## 2016-03-19 DIAGNOSIS — M79606 Pain in leg, unspecified: Secondary | ICD-10-CM | POA: Diagnosis not present

## 2016-03-25 NOTE — Progress Notes (Deleted)
Cardiology Office Note   Date:  03/25/2016   ID:  Martha Montgomery, DOB [birth year], MRN [id number]  PCP:  Martha Nakayama, MD  Cardiologist:   Martha Rouge, MD   No chief complaint on file.     History of Present Illness: Martha Montgomery is a 35 y.o. female who presents for evaluation of chest pain. History of anxiety and HTN.  Also with GERD She is being seen by  [facility]heum to r/o Lupus. Primary notes indicate ECG with non specific ST/T Wave chages and with patient fatigue patient anxious to have heart checked out  She is obese with no regular exercise   On thyroid replacement and dose recently decreased for suppressed TSH    Past Medical History:  Diagnosis Date  . Anxiety   . Anxiety disorder   . Arthritis    "knees, joints, hands, toes" (09/28/2013)  . Depression   . Gastritis nov. 2011   EGD by Dr. Oneida Alar, negative h pylori  . GERD (gastroesophageal reflux disease)   . Gout   . Hypertension x 6 years   . Hypothyroidism   . Migraines    "15/month average" (09/28/2013)  . Thyroid goiter     Past Surgical History:  Procedure Laterality Date  . CESAREAN SECTION  2008  . CHONDROPLASTY  04/02/2012   Procedure: CHONDROPLASTY;  Surgeon: Carole Civil, MD;  Location: [facility];  Service: Orthopedics;  Laterality: Left;  of left patella  . COLONOSCOPY  May 2012   Dr. Oneida Alar: normal colon, small internal hemorrhoids  . INCISION AND DRAINAGE ABSCESS  1997; 2005   "under right buttocks; under left arm"  . THYROIDECTOMY N/A 09/28/2013   Procedure: TOTAL THYROIDECTOMY;  Surgeon: Ascencion Dike, MD;  Location: [place];  Service: ENT;  Laterality: N/A;  . TOTAL THYROIDECTOMY  09/27/2013  . UPPER GI ENDOSCOPY       Current Outpatient Prescriptions  Medication Sig Dispense Refill  . amLODipine (NORVASC) 2.5 MG tablet Take 1 tablet (2.5 mg total) by mouth daily. 30 tablet 4  . FLUoxetine (PROZAC) 20 MG tablet Take 1 tablet (20 mg total) by mouth daily. 90 tablet 1  . gabapentin  (NEURONTIN) 300 MG capsule Take 600 mg by mouth at bedtime.    Marland Kitchen levothyroxine (SYNTHROID, LEVOTHROID) 150 MCG tablet Take 1 tablet (150 mcg total) by mouth daily. 30 tablet 3  . metoprolol (LOPRESSOR) 50 MG tablet TAKE ONE TABLET BY MOUTH TWICE DAILY 60 tablet 2  . mometasone (NASONEX) 50 MCG/ACT nasal spray Place 2 sprays into the nose daily. 17 g 12  . topiramate (TOPAMAX) 100 MG tablet Take 100 mg by mouth daily.    . traMADol (ULTRAM) 50 MG tablet Take 100 mg by mouth 3 (three) times daily.     Marland Kitchen triamcinolone cream (KENALOG) 0.1 % Apply 1 application topically 2 (two) times daily. 453 g 0   No current facility-administered medications for this visit.     Allergies:   Codeine and Soybean-containing drug products    Social History:  The patient  reports that she quit smoking about 5 years ago. Her smoking use included Cigarettes. She has a 5.00 pack-year smoking history. She has never used smokeless tobacco. She reports that she does not drink alcohol or use drugs.   Family History:  The patient's family history includes Arthritis in her mother; Cancer in her maternal grandmother; Hypertension in her mother; Migraines in her mother.    ROS:  Please see the  history of present illness.   Otherwise, review of systems are positive for {NONE DEFAULTED:18576::"none"}.   All other systems are reviewed and negative.    PHYSICAL EXAM: VS:  There were no vitals taken for this visit. , BMI There is no height or weight on file to calculate BMI. Affect appropriate Healthy:  appears stated age 55: normal Neck supple with no adenopathy JVP normal no bruits no thyromegaly Lungs clear with no wheezing and good diaphragmatic motion Heart:  S1/S2 no murmur, no rub, gallop or click PMI normal Abdomen: benighn, BS positve, no tenderness, no AAA no bruit.  No HSM or HJR Distal pulses intact with no bruits No edema Neuro non-focal Skin warm and dry No muscular weakness    EKG:  09/22/13 ST  rate 108 otherwise normal    Recent Labs: 06/29/2015: ALT 11; Hemoglobin 13.4; Platelets 391 12/19/2015: BUN 8; Creat 0.84; Potassium 4.4; Sodium 137 03/06/2016: TSH 0.24    Lipid Panel    Component Value Date/Time   CHOL 196 06/29/2015 1434   TRIG 82 06/29/2015 1434   HDL 58 06/29/2015 1434   CHOLHDL 3.4 06/29/2015 1434   VLDL 16 06/29/2015 1434   LDLCALC 122 06/29/2015 1434      Wt Readings from Last 3 Encounters:  03/06/16 160 lb (72.6 kg)  01/04/16 163 lb (73.9 kg)  12/19/15 167 lb (75.8 kg)      Other studies Reviewed: Additional studies/ records that were reviewed today include: ***.    ASSESSMENT AND PLAN:  1.  ***   Current medicines are reviewed at length with the patient today.  The patient {ACTIONS; HAS/DOES NOT HAVE:19233} concerns regarding medicines.  The following changes have been made:  {PLAN; NO CHANGE:13088:s}  Labs/ tests ordered today include: *** No orders of the defined types were placed in this encounter.    Disposition:   FU with ***     Signed, Martha Rouge, MD  03/25/2016 8:54 PM    [facility] [place], [place], [place]  [address] Phone: [phone]; Fax: [phone]

## 2016-03-26 ENCOUNTER — Encounter: Payer: Self-pay | Admitting: Cardiovascular Disease

## 2016-03-26 ENCOUNTER — Ambulatory Visit: Payer: Medicare Other | Admitting: Cardiovascular Disease

## 2016-03-26 DIAGNOSIS — M1990 Unspecified osteoarthritis, unspecified site: Secondary | ICD-10-CM | POA: Diagnosis not present

## 2016-03-26 DIAGNOSIS — K219 Gastro-esophageal reflux disease without esophagitis: Secondary | ICD-10-CM | POA: Diagnosis not present

## 2016-04-08 ENCOUNTER — Ambulatory Visit: Payer: Medicare Other | Admitting: Cardiology

## 2016-05-27 ENCOUNTER — Encounter: Payer: Self-pay | Admitting: Family Medicine

## 2016-05-27 ENCOUNTER — Ambulatory Visit (INDEPENDENT_AMBULATORY_CARE_PROVIDER_SITE_OTHER): Payer: Medicare Other | Admitting: Family Medicine

## 2016-05-27 VITALS — BP 140/100 | HR 92 | Ht 63.0 in | Wt 180.0 lb

## 2016-05-27 DIAGNOSIS — I10 Essential (primary) hypertension: Secondary | ICD-10-CM

## 2016-05-27 DIAGNOSIS — Z0000 Encounter for general adult medical examination without abnormal findings: Secondary | ICD-10-CM | POA: Diagnosis not present

## 2016-05-27 DIAGNOSIS — E663 Overweight: Secondary | ICD-10-CM

## 2016-05-27 NOTE — Patient Instructions (Addendum)
Nurse BP check in 5 weeks, and annual physical exam end December. Please work on good  health habits so that your health will improve. 1. Commitment to daily physical activity for 30 to 60  minutes, if you are able to do this.  2. Commitment to wise food choices. Aim for half of your  food intake to be vegetable and fruit, one quarter starchy foods, and one quarter protein. Try to eat on a regular schedule  3 meals per day, snacking between meals should be limited to vegetables or fruits or small portions of nuts. 64 ounces of water per day is generally recommended, unless you have specific health conditions, like heart failure or kidney failure where you will need to limit fluid intake.  3. Commitment to sufficient and a  good quality of physical and mental rest daily, generally between 6 to 8 hours per day.  WITH PERSISTANCE AND PERSEVERANCE, THE IMPOSSIBLE , BECOMES THE NORM! It is important that you exercise regularly at least 30 minutes 5 times a week. If you develop chest pain, have severe difficulty breathing, or feel very tired, stop exercising immediately and seek medical attention    Fasting lipid, chem 7 , TSH. CBc and vit D in December  DASH Eating Plan DASH stands for "Dietary Approaches to Stop Hypertension." The DASH eating plan is a healthy eating plan that has been shown to reduce high blood pressure (hypertension). Additional health benefits may include reducing the risk of type 2 diabetes mellitus, heart disease, and stroke. The DASH eating plan may also help with weight loss. WHAT DO I NEED TO KNOW ABOUT THE DASH EATING PLAN? For the DASH eating plan, you will follow these general guidelines:  Choose foods with a percent daily value for sodium of less than 5% (as listed on the food label).  Use salt-free seasonings or herbs instead of table salt or sea salt.  Check with your health care provider or pharmacist before using salt substitutes.  Eat lower-sodium products,  often labeled as "lower sodium" or "no salt added."  Eat fresh foods.  Eat more vegetables, fruits, and low-fat dairy products.  Choose whole grains. Look for the word "whole" as the first word in the ingredient list.  Choose fish and skinless chicken or [place] more often than red meat. Limit fish, poultry, and meat to 6 oz (170 g) each day.  Limit sweets, desserts, sugars, and sugary drinks.  Choose heart-healthy fats.  Limit cheese to 1 oz (28 g) per day.  Eat more home-cooked food and less restaurant, buffet, and fast food.  Limit fried foods.  Cook foods using methods other than frying.  Limit canned vegetables. If you do use them, rinse them well to decrease the sodium.  When eating at a restaurant, ask that your food be prepared with less salt, or no salt if possible. WHAT FOODS CAN I EAT? Seek help from a dietitian for individual calorie needs. Grains Whole grain or whole wheat bread. Brown rice. Whole grain or whole wheat pasta. Quinoa, bulgur, and whole grain cereals. Low-sodium cereals. Corn or whole wheat flour tortillas. Whole grain cornbread. Whole grain crackers. Low-sodium crackers. Vegetables Fresh or frozen vegetables (raw, steamed, roasted, or grilled). Low-sodium or reduced-sodium tomato and vegetable juices. Low-sodium or reduced-sodium tomato sauce and paste. Low-sodium or reduced-sodium canned vegetables.  Fruits All fresh, canned (in natural juice), or frozen fruits. Meat and Other Protein Products Ground beef (85% or leaner), grass-fed beef, or beef trimmed of fat. Skinless chicken  or [place]. Ground chicken or [place]. Pork trimmed of fat. All fish and seafood. Eggs. Dried beans, peas, or lentils. Unsalted nuts and seeds. Unsalted canned beans. Dairy Low-fat dairy products, such as skim or 1% milk, 2% or reduced-fat cheeses, low-fat ricotta or cottage cheese, or plain low-fat yogurt. Low-sodium or reduced-sodium cheeses. Fats and Oils Tub margarines  without trans fats. Light or reduced-fat mayonnaise and salad dressings (reduced sodium). Avocado. Safflower, olive, or canola oils. Natural peanut or almond butter. Other Unsalted popcorn and pretzels. The items listed above may not be a complete list of recommended foods or beverages. Contact your dietitian for more options. WHAT FOODS ARE NOT RECOMMENDED? Grains White bread. White pasta. White rice. Refined cornbread. Bagels and croissants. Crackers that contain trans fat. Vegetables Creamed or fried vegetables. Vegetables in a cheese sauce. Regular canned vegetables. Regular canned tomato sauce and paste. Regular tomato and vegetable juices. Fruits Dried fruits. Canned fruit in light or heavy syrup. Fruit juice. Meat and Other Protein Products Fatty cuts of meat. Ribs, chicken wings, bacon, sausage, bologna, salami, chitterlings, fatback, hot dogs, bratwurst, and packaged luncheon meats. Salted nuts and seeds. Canned beans with salt. Dairy Whole or 2% milk, cream, half-and-half, and cream cheese. Whole-fat or sweetened yogurt. Full-fat cheeses or blue cheese. Nondairy creamers and whipped toppings. Processed cheese, cheese spreads, or cheese curds. Condiments Onion and garlic salt, seasoned salt, table salt, and sea salt. Canned and packaged gravies. Worcestershire sauce. Tartar sauce. Barbecue sauce. Teriyaki sauce. Soy sauce, including reduced sodium. Steak sauce. Fish sauce. Oyster sauce. Cocktail sauce. Horseradish. Ketchup and mustard. Meat flavorings and tenderizers. Bouillon cubes. Hot sauce. Tabasco sauce. Marinades. Taco seasonings. Relishes. Fats and Oils Butter, stick margarine, lard, shortening, ghee, and bacon fat. Coconut, palm kernel, or palm oils. Regular salad dressings. Other Pickles and olives. Salted popcorn and pretzels. The items listed above may not be a complete list of foods and beverages to avoid. Contact your dietitian for more information. WHERE CAN I FIND MORE  INFORMATION? National Heart, Lung, and Blood Institute: travelstabloid.com   This information is not intended to replace advice given to you by your health care provider. Make sure you discuss any questions you have with your health care provider.   Document Released: 07/03/2011 Document Revised: 08/04/2014 Document Reviewed: 05/18/2013 Elsevier Interactive Patient Education [employer].

## 2016-05-27 NOTE — Assessment & Plan Note (Signed)
Deteriorated. Patient re-educated about  the importance of commitment to a  minimum of 150 minutes of exercise per week.  The importance of healthy food choices with portion control discussed. Encouraged to start a food diary, count calories and to consider  joining a support group. Sample diet sheets offered. Goals set by the patient for the next several months.   Weight /BMI 05/27/2016 03/06/2016 01/04/2016  WEIGHT 180 lb 160 lb 163 lb  HEIGHT 5\' 3"  5\' 3"  5\' 3"   BMI 31.89 kg/m2 28.34 kg/m2 28.88 kg/m2

## 2016-05-27 NOTE — Progress Notes (Signed)
Preventive Screening-Counseling & Management   Patient present here today for a Medicare annual wellness visit.   Current Problems (verified)   Medications Prior to Visit Allergies (verified)   PAST HISTORY  Family History  Social History Separated Mom of  One 34 y/o son   Risk Factors  Current exercise habits: 2 days per week for 15  mins  Dietary issues discussed:*Increase green eating , and stop junk/ toxic food   Cardiac risk factors:   Depression Screen  (Note: if answer to either of the following is "Yes", a more complete depression screening is indicated)   Over the past two weeks, have you felt down, depressed or hopeless? yes Over the past two weeks, have you felt little interest or pleasure in doing things? Yes at times  Have you lost interest or pleasure in daily life? No  Do you often feel hopeless? No  Do you cry easily over simple problems? No   Activities of Daily Living  In your present state of health, do you have any difficulty performing the following activities?  Driving?: No Managing money?: No Feeding yourself?:No Getting from bed to chair?:No Climbing a flight of stairs?:No Preparing food and eating?:No Bathing or showering?:No Getting dressed?:No Getting to the toilet?:No Using the toilet?:No Moving around from place to place?: No  Fall Risk Assessment In the past year have you fallen or had a near fall?:one in 12 months Are you currently taking any medications that make you dizzy?:yes   Hearing Difficulties: No Do you often ask people to speak up or repeat themselves?:No Do you experience ringing or noises in your ears?:No Do you have difficulty understanding soft or whispered voices?:No  Cognitive Testing  Alert? Yes Normal Appearance?Yes  Oriented to person? Yes Place? Yes  Time? Yes  Displays appropriate judgment?Yes  Can read the correct time from a watch face? yes Are you having problems remembering things?No  Advanced  Directives have been discussed with the patient?Yes , full code   List the Names of Other Physician/Practitioners you currently use:  Dr Percival Spanish any recent Medical Services you may have received from other than [facility] providers in the past year (date may be approximate).     Medicare Attestation  I have personally reviewed:  The patient's medical and social history  Their use of alcohol, tobacco or illicit drugs  Their current medications and supplements  The patient's functional ability including ADLs,fall risks, home safety risks, cognitive, and hearing and visual impairment  Diet and physical activities  Evidence for depression or mood disorders  The patient's weight, height, BMI, and visual acuity have been recorded in the chart. I have made referrals, counseling, and provided education to the patient based on review of the above and I have provided the patient with a written personalized care plan for preventive services.    Physical Exam BP (!) 140/100   Pulse 92   Ht 5\' 3"  (1.6 m)   Wt 180 lb (81.6 kg)   SpO2 100%   BMI 31.89 kg/m    Assessment & Plan:  Medicare annual wellness visit, initial Annual exam as documented. Counseling done  re healthy lifestyle involving commitment to 150 minutes exercise per week, heart healthy diet, and attaining healthy weight.The importance of adequate sleep also discussed. Regular seat belt use and home safety, is also discussed. Changes in health habits are decided on by the patient with goals and time frames  set for achieving them. Immunization and cancer screening needs are  specifically addressed at this visit.   Overweight (BMI 25.0-29.9) Deteriorated. Patient re-educated about  the importance of commitment to a  minimum of 150 minutes of exercise per week.  The importance of healthy food choices with portion control discussed. Encouraged to start a food diary, count calories and to consider  joining a support  group. Sample diet sheets offered. Goals set by the patient for the next several months.   Weight /BMI 05/27/2016 03/06/2016 01/04/2016  WEIGHT 180 lb 160 lb 163 lb  HEIGHT 5\' 3"  5\' 3"  5\' 3"   BMI 31.89 kg/m2 28.34 kg/m2 28.88 kg/m2      HTN (hypertension), benign Uncontrolled, reports med non compliance, and has  Had significant weight gain, will re eval in next 6 to 8 weeks DASH diet and commitment to daily physical activity for a minimum of 30 minutes discussed and encouraged, as a part of hypertension management. The importance of attaining a healthy weight is also discussed.  BP/Weight 05/27/2016 03/06/2016 01/04/2016 12/19/2015 06/28/2015 05/30/2014 123456  Systolic BP XX123456 [id number]  Diastolic BP 123XX123 84 94 88 80 82 86  Wt. (Lbs) 180 160 163 167 149 156.12 149.2  BMI 31.89 28.34 28.88 30.54 27.25 28.55 27.28

## 2016-05-27 NOTE — Assessment & Plan Note (Signed)

## 2016-05-27 NOTE — Assessment & Plan Note (Signed)
Uncontrolled, reports med non compliance, and has  Had significant weight gain, will re eval in next 6 to 8 weeks DASH diet and commitment to daily physical activity for a minimum of 30 minutes discussed and encouraged, as a part of hypertension management. The importance of attaining a healthy weight is also discussed.  BP/Weight 05/27/2016 03/06/2016 01/04/2016 12/19/2015 06/28/2015 05/30/2014 123456  Systolic BP XX123456 [id number]  Diastolic BP 123XX123 84 94 88 80 82 86  Wt. (Lbs) 180 160 163 167 149 156.12 149.2  BMI 31.89 28.34 28.88 30.54 27.25 28.55 27.28

## 2016-05-28 ENCOUNTER — Ambulatory Visit: Payer: Medicare Other | Admitting: Cardiology

## 2016-05-28 ENCOUNTER — Encounter: Payer: Self-pay | Admitting: Cardiology

## 2016-05-28 NOTE — Progress Notes (Deleted)
Clinical Summary Martha Montgomery is a 35 y.o.female seen as a new patient.   1. Chest pain  - EKG with prior nonspecific changes  2. Lupus?  3. HTN    Past Medical History:  Diagnosis Date  . Anxiety   . Anxiety disorder   . Arthritis    "knees, joints, hands, toes" (09/28/2013)  . Depression   . Gastritis nov. 2011   EGD by Dr. Oneida Alar, negative h pylori  . GERD (gastroesophageal reflux disease)   . Gout   . Hypertension x 6 years   . Hypothyroidism   . Migraines    "15/month average" (09/28/2013)  . Thyroid goiter      Allergies  Allergen Reactions  . Codeine Itching  . Soybean-Containing Drug Products Other (See Comments)    Tongue swells a little and feels "rug burned".     Current Outpatient Prescriptions  Medication Sig Dispense Refill  . amLODipine (NORVASC) 2.5 MG tablet Take 1 tablet (2.5 mg total) by mouth daily. 30 tablet 4  . FLUoxetine (PROZAC) 20 MG tablet Take 1 tablet (20 mg total) by mouth daily. 90 tablet 1  . levothyroxine (SYNTHROID, LEVOTHROID) 150 MCG tablet Take 1 tablet (150 mcg total) by mouth daily. 30 tablet 3  . metoprolol (LOPRESSOR) 50 MG tablet TAKE ONE TABLET BY MOUTH TWICE DAILY 60 tablet 2  . mometasone (NASONEX) 50 MCG/ACT nasal spray Place 2 sprays into the nose daily. 17 g 12  . pregabalin (LYRICA) 75 MG capsule Take 75 mg by mouth 2 (two) times daily.    Marland Kitchen topiramate (TOPAMAX) 100 MG tablet Take 100 mg by mouth daily.    . traMADol (ULTRAM) 50 MG tablet Take 100 mg by mouth 3 (three) times daily.     Marland Kitchen triamcinolone cream (KENALOG) 0.1 % Apply 1 application topically 2 (two) times daily. 453 g 0   No current facility-administered medications for this visit.      Past Surgical History:  Procedure Laterality Date  . CESAREAN SECTION  2008  . CHONDROPLASTY  04/02/2012   Procedure: CHONDROPLASTY;  Surgeon: Carole Civil, MD;  Location: [facility];  Service: Orthopedics;  Laterality: Left;  of left patella  . COLONOSCOPY  May  2012   Dr. Oneida Alar: normal colon, small internal hemorrhoids  . INCISION AND DRAINAGE ABSCESS  1997; 2005   "under right buttocks; under left arm"  . THYROIDECTOMY N/A 09/28/2013   Procedure: TOTAL THYROIDECTOMY;  Surgeon: Ascencion Dike, MD;  Location: [facility];  Service: ENT;  Laterality: N/A;  . TOTAL THYROIDECTOMY  09/27/2013  . UPPER GI ENDOSCOPY       Allergies  Allergen Reactions  . Codeine Itching  . Soybean-Containing Drug Products Other (See Comments)    Tongue swells a little and feels "rug burned".      Family History  Problem Relation Age of Onset  . Arthritis Mother   . Migraines Mother   . Hypertension Mother   . Cancer Maternal Grandmother     lung  . Asthma      family history   . Colon cancer Neg Hx      Social History Martha Montgomery reports that she quit smoking about 5 years ago. Her smoking use included Cigarettes. She has a 5.00 pack-year smoking history. She has never used smokeless tobacco. Martha Montgomery reports that she does not drink alcohol.   Review of Systems CONSTITUTIONAL: No weight loss, fever, chills, weakness or fatigue.  HEENT: Eyes: No  visual loss, blurred vision, double vision or yellow sclerae.No hearing loss, sneezing, congestion, runny nose or sore throat.  SKIN: No rash or itching.  CARDIOVASCULAR:  RESPIRATORY: No shortness of breath, cough or sputum.  GASTROINTESTINAL: No anorexia, nausea, vomiting or diarrhea. No abdominal pain or blood.  GENITOURINARY: No burning on urination, no polyuria NEUROLOGICAL: No headache, dizziness, syncope, paralysis, ataxia, numbness or tingling in the extremities. No change in bowel or bladder control.  MUSCULOSKELETAL: No muscle, back pain, joint pain or stiffness.  LYMPHATICS: No enlarged nodes. No history of splenectomy.  PSYCHIATRIC: No history of depression or anxiety.  ENDOCRINOLOGIC: No reports of sweating, cold or heat intolerance. No polyuria or polydipsia.  Marland Kitchen   Physical Examination There were no  vitals filed for this visit. There were no vitals filed for this visit.  Gen: resting comfortably, no acute distress HEENT: no scleral icterus, pupils equal round and reactive, no palptable cervical adenopathy,  CV Resp: Clear to auscultation bilaterally GI: abdomen is soft, non-tender, non-distended, normal bowel sounds, no hepatosplenomegaly MSK: extremities are warm, no edema.  Skin: warm, no rash Neuro:  no focal deficits Psych: appropriate affect   Diagnostic Studies     Assessment and Plan        Arnoldo Lenis, M.D., F.A.C.C.

## 2016-07-22 ENCOUNTER — Encounter: Payer: Self-pay | Admitting: Family Medicine

## 2016-07-22 ENCOUNTER — Ambulatory Visit (INDEPENDENT_AMBULATORY_CARE_PROVIDER_SITE_OTHER): Payer: Medicare Other | Admitting: Family Medicine

## 2016-07-22 ENCOUNTER — Other Ambulatory Visit (HOSPITAL_COMMUNITY)
Admission: RE | Admit: 2016-07-22 | Discharge: 2016-07-22 | Disposition: A | Discharge status: Home / Self Care | Payer: Medicare Other | Source: Ambulatory Visit | Attending: Family Medicine | Admitting: Family Medicine

## 2016-07-22 VITALS — BP 138/90 | HR 77 | Resp 16 | Ht 63.0 in | Wt 184.0 lb

## 2016-07-22 DIAGNOSIS — M25552 Pain in left hip: Secondary | ICD-10-CM

## 2016-07-22 DIAGNOSIS — I10 Essential (primary) hypertension: Secondary | ICD-10-CM

## 2016-07-22 DIAGNOSIS — Z01419 Encounter for gynecological examination (general) (routine) without abnormal findings: Secondary | ICD-10-CM | POA: Insufficient documentation

## 2016-07-22 DIAGNOSIS — Z308 Encounter for other contraceptive management: Secondary | ICD-10-CM

## 2016-07-22 DIAGNOSIS — Z0000 Encounter for general adult medical examination without abnormal findings: Secondary | ICD-10-CM

## 2016-07-22 DIAGNOSIS — Z124 Encounter for screening for malignant neoplasm of cervix: Secondary | ICD-10-CM

## 2016-07-22 DIAGNOSIS — M25551 Pain in right hip: Secondary | ICD-10-CM | POA: Insufficient documentation

## 2016-07-22 DIAGNOSIS — E559 Vitamin D deficiency, unspecified: Secondary | ICD-10-CM

## 2016-07-22 MED ORDER — POTASSIUM CHLORIDE ER 10 MEQ PO TBCR: 10.0000 meq | EXTENDED_RELEASE_TABLET | Freq: Every day | ORAL | 3 refills | Status: DC

## 2016-07-22 MED ORDER — HYDROCHLOROTHIAZIDE 12.5 MG PO CAPS: 12.5000 mg | ORAL_CAPSULE | Freq: Every day | ORAL | 3 refills | Status: DC

## 2016-07-22 MED ORDER — MEDROXYPROGESTERONE ACETATE 150 MG/ML IM SUSP
150.0000 mg | Freq: Once | INTRAMUSCULAR | Status: AC
  Administered 2016-07-22: 150 mg via INTRAMUSCULAR

## 2016-07-22 NOTE — Assessment & Plan Note (Signed)
Depo provera administered at visit

## 2016-07-22 NOTE — Progress Notes (Signed)
    Martha Montgomery     MRN: [id number]      DOB: 13-Jul-1981  HPI: Patient is in for annual physical exam. C/o bilateral hip pain, wants ortho eval Needs depo injection Blood pressure uncontrolled and this is addressed Immunization is reviewed , and  updated if needed.   PE: Pleasant  female, alert and oriented x 3, in no cardio-pulmonary distress. Afebrile. HEENT No facial trauma or asymetry. Sinuses non tender.  Extra occullar muscles intact,  External ears normal, tympanic membranes clear. Oropharynx moist, no exudate. Neck: supple, no adenopathy,JVD or thyromegaly.No bruits.  Chest: Clear to ascultation bilaterally.No crackles or wheezes. Non tender to palpation  Breast: No asymetry,no masses or lumps. No tenderness. No nipple discharge or inversion. No axillary or supraclavicular adenopathy  Cardiovascular system; Heart sounds normal,  S1 and  S2 ,no S3.  No murmur, or thrill. Apical beat not displaced Peripheral pulses normal.  Abdomen: Soft, non tender, no organomegaly or masses. No bruits. Bowel sounds normal. No guarding, tenderness or rebound.    GU: External genitalia normal female genitalia , normal female distribution of hair. No lesions. Urethral meatus normal in size, no  Prolapse, no lesions visibly  Present. Bladder non tender. Vagina pink and moist , with no visible lesions , discharge present . Adequate pelvic support no  cystocele or rectocele noted Cervix pink and appears healthy, no lesions or ulcerations noted, no discharge noted from os Uterus normal size, no adnexal masses, no cervical motion or adnexal tenderness.   Musculoskeletal exam:  Decreased ROM spine and , hips , normal in  shoulders and knees. No deformity ,swelling or crepitus noted. No muscle wasting or atrophy.   Neurologic: Cranial nerves 2 to 12 intact. Power, tone ,sensation and reflexes normal throughout. No disturbance in gait. No tremor.  Skin: Intact, no  ulceration, erythema , scaling or rash noted. Pigmentation normal throughout  Psych; Normal mood and affect. Judgement and concentration normal   Assessment & Plan:  Annual physical exam Annual exam as documented. Counseling done  re healthy lifestyle involving commitment to 150 minutes exercise per week, heart healthy diet, and attaining healthy weight.The importance of adequate sleep also discussed. Regular seat belt use and home safety, is also discussed. Changes in health habits are decided on by the patient with goals and time frames  set for achieving them. Immunization and cancer screening needs are specifically addressed at this visit.   Encounter for other contraceptive management Depo provera administered at visit  HTN (hypertension), benign Uncontrolled add HCTZ and potassium DASH diet and commitment to daily physical activity for a minimum of 30 minutes discussed and encouraged, as a part of hypertension management. The importance of attaining a healthy weight is also discussed.  BP/Weight 07/22/2016 05/27/2016 03/06/2016 01/04/2016 12/19/2015 06/28/2015 0000000  Systolic BP 0000000 XX123456 123XX123 Q000111Q XX123456 0000000 123XX123  Diastolic BP 90 123XX123 84 94 88 80 82  Wt. (Lbs) 184 180 160 163 167 149 156.12  BMI 32.59 31.89 28.34 28.88 30.54 27.25 28.55       Bilateral hip pain 2 month h/o increased bilateral hip pain and some instability, refer to orthopedics for eval

## 2016-07-22 NOTE — Assessment & Plan Note (Signed)

## 2016-07-22 NOTE — Assessment & Plan Note (Signed)
2 month h/o increased bilateral hip pain and some instability, refer to orthopedics for eval

## 2016-07-22 NOTE — Assessment & Plan Note (Signed)
Uncontrolled add HCTZ and potassium DASH diet and commitment to daily physical activity for a minimum of 30 minutes discussed and encouraged, as a part of hypertension management. The importance of attaining a healthy weight is also discussed.  BP/Weight 07/22/2016 05/27/2016 03/06/2016 01/04/2016 12/19/2015 06/28/2015 0000000  Systolic BP 0000000 XX123456 123XX123 Q000111Q XX123456 0000000 123XX123  Diastolic BP 90 123XX123 84 94 88 80 82  Wt. (Lbs) 184 180 160 163 167 149 156.12  BMI 32.59 31.89 28.34 28.88 30.54 27.25 28.55

## 2016-07-22 NOTE — Patient Instructions (Addendum)
Nurse BP check in 6 weeks  MD follow up in 4 months  Fasting lipid, cmp and EGFr, TSH, vit D, cbc tomorrow  Depo provera in office todAY.  ADDITIONAL MEDICATION FOR UNCONTROLLED BLOOD PRESSURE , HCTZ AND POTASSIUM START TODAY, CONTINUE AMLODIPINE AS BEFORE  You are referred to dr Aline Brochure for bilateral hip pain  Please work on good  health habits so that your health will improve. 1. Commitment to daily physical activity for 30 to 60  minutes, if you are able to do this.  2. Commitment to wise food choices. Aim for half of your  food intake to be vegetable and fruit, one quarter starchy foods, and one quarter protein. Try to eat on a regular schedule  3 meals per day, snacking between meals should be limited to vegetables or fruits or small portions of nuts. 64 ounces of water per day is generally recommended, unless you have specific health conditions, like heart failure or kidney failure where you will need to limit fluid intake.  3. Commitment to sufficient and a  good quality of physical and mental rest daily, generally between 6 to 8 hours per day.  WITH PERSISTANCE AND PERSEVERANCE, THE IMPOSSIBLE , BECOMES THE NORM! Thank you  for choosing [facility]. We consider it a privelige to serve you.  Delivering excellent health care in a caring and  compassionate way is our goal.  Partnering with you,  so that together we can achieve this goal is our strategy.

## 2016-07-23 ENCOUNTER — Other Ambulatory Visit: Payer: Self-pay

## 2016-07-23 LAB — CYTOLOGY - PAP: DIAGNOSIS: NEGATIVE

## 2016-07-23 MED ORDER — LEVOTHYROXINE SODIUM 150 MCG PO TABS: 150.0000 ug | ORAL_TABLET | Freq: Every day | ORAL | 3 refills | Status: DC

## 2016-08-07 ENCOUNTER — Other Ambulatory Visit: Payer: Self-pay | Admitting: Family Medicine

## 2016-08-07 DIAGNOSIS — Z0000 Encounter for general adult medical examination without abnormal findings: Secondary | ICD-10-CM | POA: Diagnosis not present

## 2016-08-07 DIAGNOSIS — E559 Vitamin D deficiency, unspecified: Secondary | ICD-10-CM | POA: Diagnosis not present

## 2016-08-07 DIAGNOSIS — I10 Essential (primary) hypertension: Secondary | ICD-10-CM | POA: Diagnosis not present

## 2016-08-08 ENCOUNTER — Other Ambulatory Visit: Payer: Self-pay

## 2016-08-08 LAB — CBC
HCT: 40.2 % (ref 35.0–45.0)
Hemoglobin: 13.3 g/dL (ref 11.7–15.5)
MCH: 30.4 pg (ref 27.0–33.0)
MCHC: 33.1 g/dL (ref 32.0–36.0)
MCV: 92 fL (ref 80.0–100.0)
MPV: 9.3 fL (ref 7.5–12.5)
PLATELETS: 456 10*3/uL — AB (ref 140–400)
RBC: 4.37 MIL/uL (ref 3.80–5.10)
RDW: 14.1 % (ref 11.0–15.0)
WBC: 6.1 10*3/uL (ref 3.8–10.8)

## 2016-08-08 LAB — LIPID PANEL
Cholesterol: 202 mg/dL — ABNORMAL HIGH (ref <200)
HDL: 55 mg/dL (ref >50)
LDL Cholesterol: 131 mg/dL — ABNORMAL HIGH (ref <100)
Total CHOL/HDL Ratio: 3.7 Ratio (ref <5.0)
Triglycerides: 82 mg/dL (ref <150)
VLDL: 16 mg/dL (ref <30)

## 2016-08-08 LAB — COMPLETE METABOLIC PANEL WITH GFR
ALBUMIN: 4.7 g/dL (ref 3.6–5.1)
ALK PHOS: 51 U/L (ref 33–115)
ALT: 11 U/L (ref 6–29)
AST: 16 U/L (ref 10–30)
BUN: 7 mg/dL (ref 7–25)
CALCIUM: 9.3 mg/dL (ref 8.6–10.2)
CO2: 23 mmol/L (ref 20–31)
Chloride: 101 mmol/L (ref 98–110)
Creat: 0.88 mg/dL (ref 0.50–1.10)
GFR, Est African American: >89 mL/min (ref >=60)
GFR, Est Non African American: 86 mL/min (ref >=60)
Glucose, Bld: 77 mg/dL (ref 65–99)
POTASSIUM: 4 mmol/L (ref 3.5–5.3)
Sodium: 138 mmol/L (ref 135–146)
Total Bilirubin: 0.5 mg/dL (ref 0.2–1.2)
Total Protein: 7.3 g/dL (ref 6.1–8.1)

## 2016-08-08 LAB — TSH: TSH: 4.24 m[IU]/L

## 2016-08-08 LAB — VITAMIN D 25 HYDROXY (VIT D DEFICIENCY, FRACTURES): VIT D 25 HYDROXY: 14 ng/mL — AB (ref 30–100)

## 2016-08-08 MED ORDER — VITAMIN D (ERGOCALCIFEROL) 1.25 MG (50000 UNIT) PO CAPS: 50000.0000 [IU] | ORAL_CAPSULE | ORAL | 5 refills | Status: DC

## 2016-08-26 ENCOUNTER — Ambulatory Visit (INDEPENDENT_AMBULATORY_CARE_PROVIDER_SITE_OTHER): Payer: Medicare Other | Admitting: Orthopedic Surgery

## 2016-08-26 ENCOUNTER — Ambulatory Visit (INDEPENDENT_AMBULATORY_CARE_PROVIDER_SITE_OTHER): Payer: Medicare Other

## 2016-08-26 ENCOUNTER — Encounter: Payer: Self-pay | Admitting: Orthopedic Surgery

## 2016-08-26 ENCOUNTER — Ambulatory Visit: Payer: Medicare Other

## 2016-08-26 VITALS — BP 136/100 | HR 117 | Wt 184.0 lb

## 2016-08-26 DIAGNOSIS — M25552 Pain in left hip: Principal | ICD-10-CM

## 2016-08-26 DIAGNOSIS — M25551 Pain in right hip: Secondary | ICD-10-CM

## 2016-08-26 DIAGNOSIS — M545 Low back pain: Secondary | ICD-10-CM | POA: Diagnosis not present

## 2016-08-26 NOTE — Progress Notes (Signed)
Patient ID: Martha Montgomery, female   DOB: 1981-04-28, 36 y.o.   MRN: [id number]  Chief Complaint  Patient presents with  . Hip Pain    BILATERAL HIP/GROIN PAIN    HPI Martha Montgomery is a 36 y.o. female.  Presents for evaluation of bilateral hip pain  Patient has had bilateral hip pain for several years now she has a diagnosis of fibromyalgia she is currently on Lyrica. And tramadol.  Complains of pain on the side near the iliac crest and trochanter occasionally radiates into the groin and radiates down her legs into her feet occasionally stopping at the knee  She denies numbness or weakness but does have sharp pain which runs down her leg.  Review of Systems Review of Systems  Respiratory: Negative.   Cardiovascular: Negative.   Genitourinary: Negative.     Past Medical History:  Diagnosis Date  . Anxiety   . Anxiety disorder   . Arthritis    "knees, joints, hands, toes" (09/28/2013)  . Depression   . Gastritis nov. 2011   EGD by Dr. Oneida Alar, negative h pylori  . GERD (gastroesophageal reflux disease)   . Gout   . Hypertension x 6 years   . Hypothyroidism   . Migraines    "15/month average" (09/28/2013)  . Thyroid goiter     Past Surgical History:  Procedure Laterality Date  . CESAREAN SECTION  2008  . CHONDROPLASTY  04/02/2012   Procedure: CHONDROPLASTY;  Surgeon: Carole Civil, MD;  Location: [facility];  Service: Orthopedics;  Laterality: Left;  of left patella  . COLONOSCOPY  May 2012   Dr. Oneida Alar: normal colon, small internal hemorrhoids  . INCISION AND DRAINAGE ABSCESS  1997; 2005   "under right buttocks; under left arm"  . THYROIDECTOMY N/A 09/28/2013   Procedure: TOTAL THYROIDECTOMY;  Surgeon: Ascencion Dike, MD;  Location: [facility];  Service: ENT;  Laterality: N/A;  . TOTAL THYROIDECTOMY  09/27/2013  . UPPER GI ENDOSCOPY      Family History  Problem Relation Age of Onset  . Arthritis Mother   . Migraines Mother   . Hypertension Mother   . Cancer Maternal Grandmother      lung  . Asthma      family history   . Colon cancer Neg Hx    was reviewed  Social History Social History  Substance Use Topics  . Smoking status: Former Smoker    Packs/day: 1.00    Years: 5.00    Types: Cigarettes    Quit date: 09/11/2010  . Smokeless tobacco: Never Used     Comment: QUIT SMOKING X 3 YEARS AGO  . Alcohol use No     Comment:  "have a drink a couple times per yr"    Allergies  Allergen Reactions  . Codeine Itching  . Soybean-Containing Drug Products Other (See Comments)    Tongue swells a little and feels "rug burned".    Current Outpatient Prescriptions  Medication Sig Dispense Refill  . amLODipine (NORVASC) 2.5 MG tablet Take 1 tablet (2.5 mg total) by mouth daily. 30 tablet 4  . FLUoxetine (PROZAC) 20 MG tablet Take 1 tablet (20 mg total) by mouth daily. 90 tablet 1  . hydrochlorothiazide (MICROZIDE) 12.5 MG capsule Take 1 capsule (12.5 mg total) by mouth daily. 30 capsule 3  . levothyroxine (SYNTHROID, LEVOTHROID) 150 MCG tablet Take 1 tablet (150 mcg total) by mouth daily. 30 tablet 3  . metoprolol (LOPRESSOR) 50 MG tablet TAKE ONE  TABLET BY MOUTH TWICE DAILY 60 tablet 2  . mometasone (NASONEX) 50 MCG/ACT nasal spray Place 2 sprays into the nose daily. 17 g 12  . potassium chloride (K-DUR) 10 MEQ tablet Take 1 tablet (10 mEq total) by mouth daily. 30 tablet 3  . pregabalin (LYRICA) 75 MG capsule Take 75 mg by mouth 2 (two) times daily.    Marland Kitchen topiramate (TOPAMAX) 100 MG tablet Take 100 mg by mouth daily.    . traMADol (ULTRAM) 50 MG tablet Take 100 mg by mouth 3 (three) times daily.     Marland Kitchen triamcinolone cream (KENALOG) 0.1 % Apply 1 application topically 2 (two) times daily. 453 g 0  . Vitamin D, Ergocalciferol, (DRISDOL) 50000 units CAPS capsule Take 1 capsule (50,000 Units total) by mouth every 7 (seven) days. 4 capsule 5   No current facility-administered medications for this visit.        Physical Exam Physical Exam Gen. appearance  well-groomed oriented 3 mood and affect normal gait and station normal  She has the following orthopedic exam Ortho Exam Normal range of motion in both hips including flexion extension internal/external rotation without pain leg lengths are equal hips are stable muscle strength and tone is normal in each leg. Skin normal in both legs as well  Straight leg raises did produce discomfort in her back this was noted at 40 on the left and 60 on the right  Neurologic examination  Reflexes were 2+ and equal  Sensation was normal in both feet and legs  Babinski's tests were down going   The vascular examination revealed normal dorsalis pedis pulses in both feet and both feet were warm with good capillary refill   Data Reviewed Plain films show mild facet arthritis and slight coronal plane alignment abnormality with normal pelvis for hips. We did see some arthritis in the facet joints of the back  Assessment  Encounter Diagnoses  Name Primary?  . Bilateral hip pain Yes  . Low back pain, unspecified back pain laterality, unspecified chronicity, with sciatica presence unspecified       Plan  Recommend water therapy   Arther Abbott, MD 08/26/2016 4:10 PM

## 2016-08-27 ENCOUNTER — Other Ambulatory Visit: Payer: Self-pay | Admitting: Family Medicine

## 2016-08-27 DIAGNOSIS — I10 Essential (primary) hypertension: Secondary | ICD-10-CM

## 2016-09-02 ENCOUNTER — Encounter: Payer: Self-pay | Admitting: Family Medicine

## 2016-09-02 ENCOUNTER — Ambulatory Visit (INDEPENDENT_AMBULATORY_CARE_PROVIDER_SITE_OTHER): Payer: Medicare Other | Admitting: Family Medicine

## 2016-09-02 VITALS — BP 112/62 | HR 100 | Resp 18 | Ht 63.0 in | Wt 183.0 lb

## 2016-09-02 DIAGNOSIS — I10 Essential (primary) hypertension: Secondary | ICD-10-CM | POA: Diagnosis not present

## 2016-09-02 DIAGNOSIS — E890 Postprocedural hypothyroidism: Secondary | ICD-10-CM

## 2016-09-02 DIAGNOSIS — F419 Anxiety disorder, unspecified: Secondary | ICD-10-CM

## 2016-09-02 DIAGNOSIS — G4730 Sleep apnea, unspecified: Secondary | ICD-10-CM

## 2016-09-02 DIAGNOSIS — M25551 Pain in right hip: Secondary | ICD-10-CM

## 2016-09-02 DIAGNOSIS — E663 Overweight: Secondary | ICD-10-CM

## 2016-09-02 DIAGNOSIS — G43109 Migraine with aura, not intractable, without status migrainosus: Secondary | ICD-10-CM

## 2016-09-02 DIAGNOSIS — F32A Depression, unspecified: Secondary | ICD-10-CM

## 2016-09-02 DIAGNOSIS — M25552 Pain in left hip: Secondary | ICD-10-CM

## 2016-09-02 DIAGNOSIS — F418 Other specified anxiety disorders: Secondary | ICD-10-CM

## 2016-09-02 DIAGNOSIS — J302 Other seasonal allergic rhinitis: Secondary | ICD-10-CM

## 2016-09-02 DIAGNOSIS — F329 Major depressive disorder, single episode, unspecified: Secondary | ICD-10-CM

## 2016-09-02 MED ORDER — PREDNISONE 5 MG PO TABS: 5.0000 mg | ORAL_TABLET | Freq: Two times a day (BID) | ORAL | 0 refills | Status: AC

## 2016-09-02 NOTE — Patient Instructions (Addendum)
F/u as before, call if you need me before   Depression is much better on current fluoxetine dose , need to call [facility] for appt.  Call and ask for appt for sleep study when able  Short course of prednisone sent for hip pain, therapy will help the most and weight loss

## 2016-09-03 ENCOUNTER — Other Ambulatory Visit: Payer: Self-pay | Admitting: Family Medicine

## 2016-09-07 ENCOUNTER — Encounter: Payer: Self-pay | Admitting: Family Medicine

## 2016-09-07 NOTE — Progress Notes (Signed)
Martha Montgomery     MRN: [id number]      DOB: October 09, 1980   HPI Martha Montgomery is here for follow up and re-evaluation of chronic medical conditions, specifically hypertension and depression.Seen by nurse for BP check and she voiced several concerns, so she is seen by me  today .Preventive health is updated, specifically  Cancer screening and Immunization.   Questions or concerns regarding consultations or procedures which the PT has had in the interim are  Addressed.States that althougsonh she saw ortho for hip pain, her symptoms are unchanged, requesting some medication relief and is aware that weight loss and PT will help most. Limited prednisone prescribed Told the nurse that her depression had not responded to fluoxetine, however PHQ 9 score shows marked improvement, she clearly has a lot of anxiety , feels overwhelmed, is the only child caring for a medically challenged mother and also a single parent to a  Son, has little/ no time to care for herself    ROS See HPI  Denies recent fever or chills. Denies sinus pressure, nasal congestion, ear pain or sore throat. Denies chest congestion, productive cough or wheezing. Denies chest pains, palpitations and leg swelling Denies abdominal pain, nausea, vomiting,diarrhea or constipation.   Denies dysuria, frequency, hesitancy or incontinence.  Denies headaches, seizures, numbness, or tingling.  Denies skin break down or rash.   PE  BP 112/62   Pulse 100   Resp 18   Ht 5\' 3"  (1.6 m)   Wt 183 lb (83 kg)   SpO2 100%   BMI 32.42 kg/m   Patient alert and oriented and in no cardiopulmonary distress.  HEENT: No facial asymmetry, EOMI,   oropharynx pink and moist.  Neck supple no JVD, no mass.  Chest: Clear to auscultation bilaterally.  CVS: S1, S2 no murmurs, no S3.Regular rate.  ABD: Soft non tender.   Ext: No edema  MS: Adequate ROM spine, shoulders,decreased in  hips and knees.  Skin: Intact, no ulcerations or rash  noted.  Psych: Good eye contact, normal affect. Memory intact anxious not depressed appearing.  CNS: CN 2-12 intact, power,  normal throughout.no focal deficits noted.   Assessment & Plan  HTN (hypertension), benign Controlled, no change in medication DASH diet and commitment to daily physical activity for a minimum of 30 minutes discussed and encouraged, as a part of hypertension management. The importance of attaining a healthy weight is also discussed.  BP/Weight 09/02/2016 08/26/2016 07/22/2016 05/27/2016 03/06/2016 01/04/2016 [id number]  Systolic BP XX123456 XX123456 0000000 XX123456 123XX123 Q000111Q XX123456  Diastolic BP 62 123XX123 90 123XX123 84 94 88  Wt. (Lbs) 183 184 184 180 160 163 167  BMI 32.42 32.59 32.59 31.89 28.34 28.88 30.54       Overweight (BMI 25.0-29.9) Unchnanged. Patient re-educated about  the importance of commitment to a  minimum of 150 minutes of exercise per week.  The importance of healthy food choices with portion control discussed. Encouraged to start a food diary, count calories and to consider  joining a support group. Sample diet sheets offered. Goals set by the patient for the next several months.   Weight /BMI 09/02/2016 08/26/2016 07/22/2016  WEIGHT 183 lb 184 lb 184 lb  HEIGHT 5\' 3"  - 5\' 3"   BMI 32.42 kg/m2 32.59 kg/m2 32.59 kg/m2      Post-surgical hypothyroidism Controlled, no change in medication   Sleep disorder breathing nees slewep study and definitve treatment , based on history, howeverunable to afford currently  Anxiety  and depression Markedly improved PHQ 9 score, Martha Montgomery has a lot of anxiety and ongoing personal stress, needs therapy, will refer for same  Bilateral hip pain Has seen ortho and reports this is unchanged Short course of oral prednisone prescribed  Migraine with aura Managed by neurology and currently controlled   Allergic rhinitis Controlled, no change in medication

## 2016-09-07 NOTE — Assessment & Plan Note (Signed)
Controlled, no change in medication  

## 2016-09-07 NOTE — Assessment & Plan Note (Addendum)
Unchnanged. Patient re-educated about  the importance of commitment to a  minimum of 150 minutes of exercise per week.  The importance of healthy food choices with portion control discussed. Encouraged to start a food diary, count calories and to consider  joining a support group. Sample diet sheets offered. Goals set by the patient for the next several months.   Weight /BMI 09/02/2016 08/26/2016 07/22/2016  WEIGHT 183 lb 184 lb 184 lb  HEIGHT 5\' 3"  - 5\' 3"   BMI 32.42 kg/m2 32.59 kg/m2 32.59 kg/m2

## 2016-09-07 NOTE — Assessment & Plan Note (Signed)
Markedly improved PHQ 9 score, Martha Montgomery has a lot of anxiety and ongoing personal stress, needs therapy, will refer for same

## 2016-09-07 NOTE — Assessment & Plan Note (Signed)
Controlled, no change in medication DASH diet and commitment to daily physical activity for a minimum of 30 minutes discussed and encouraged, as a part of hypertension management. The importance of attaining a healthy weight is also discussed.  BP/Weight 09/02/2016 08/26/2016 07/22/2016 05/27/2016 03/06/2016 01/04/2016 [id number]  Systolic BP XX123456 XX123456 0000000 XX123456 123XX123 Q000111Q XX123456  Diastolic BP 62 123XX123 90 123XX123 84 94 88  Wt. (Lbs) 183 184 184 180 160 163 167  BMI 32.42 32.59 32.59 31.89 28.34 28.88 30.54

## 2016-09-07 NOTE — Assessment & Plan Note (Signed)
Has seen ortho and reports this is unchanged Short course of oral prednisone prescribed

## 2016-09-07 NOTE — Assessment & Plan Note (Signed)
nees slewep study and definitve treatment , based on history, howeverunable to afford currently

## 2016-09-07 NOTE — Assessment & Plan Note (Signed)
Managed by neurology and currently controlled 

## 2016-09-15 DIAGNOSIS — M1990 Unspecified osteoarthritis, unspecified site: Secondary | ICD-10-CM | POA: Diagnosis not present

## 2016-09-15 DIAGNOSIS — G4459 Other complicated headache syndrome: Secondary | ICD-10-CM | POA: Diagnosis not present

## 2016-09-15 DIAGNOSIS — M79606 Pain in leg, unspecified: Secondary | ICD-10-CM | POA: Diagnosis not present

## 2016-09-15 DIAGNOSIS — Z79891 Long term (current) use of opiate analgesic: Secondary | ICD-10-CM | POA: Diagnosis not present

## 2016-09-15 DIAGNOSIS — M545 Low back pain: Secondary | ICD-10-CM | POA: Diagnosis not present

## 2016-10-14 DIAGNOSIS — M1990 Unspecified osteoarthritis, unspecified site: Secondary | ICD-10-CM | POA: Diagnosis not present

## 2016-10-14 DIAGNOSIS — M545 Low back pain: Secondary | ICD-10-CM | POA: Diagnosis not present

## 2016-10-14 DIAGNOSIS — Z79899 Other long term (current) drug therapy: Secondary | ICD-10-CM | POA: Diagnosis not present

## 2016-10-14 DIAGNOSIS — M79606 Pain in leg, unspecified: Secondary | ICD-10-CM | POA: Diagnosis not present

## 2016-10-14 DIAGNOSIS — G894 Chronic pain syndrome: Secondary | ICD-10-CM | POA: Diagnosis not present

## 2016-10-14 DIAGNOSIS — G4459 Other complicated headache syndrome: Secondary | ICD-10-CM | POA: Diagnosis not present

## 2016-10-14 DIAGNOSIS — Z79891 Long term (current) use of opiate analgesic: Secondary | ICD-10-CM | POA: Diagnosis not present

## 2016-12-09 ENCOUNTER — Ambulatory Visit: Payer: Medicare Other | Admitting: Family Medicine

## 2016-12-11 DIAGNOSIS — M79606 Pain in leg, unspecified: Secondary | ICD-10-CM | POA: Diagnosis not present

## 2016-12-11 DIAGNOSIS — Z79891 Long term (current) use of opiate analgesic: Secondary | ICD-10-CM | POA: Diagnosis not present

## 2016-12-11 DIAGNOSIS — M1990 Unspecified osteoarthritis, unspecified site: Secondary | ICD-10-CM | POA: Diagnosis not present

## 2016-12-11 DIAGNOSIS — G4459 Other complicated headache syndrome: Secondary | ICD-10-CM | POA: Diagnosis not present

## 2016-12-11 DIAGNOSIS — K219 Gastro-esophageal reflux disease without esophagitis: Secondary | ICD-10-CM | POA: Diagnosis not present

## 2016-12-27 ENCOUNTER — Other Ambulatory Visit: Payer: Self-pay | Admitting: Family Medicine

## 2017-01-05 ENCOUNTER — Ambulatory Visit (INDEPENDENT_AMBULATORY_CARE_PROVIDER_SITE_OTHER): Payer: Medicare Other | Admitting: Family Medicine

## 2017-01-05 ENCOUNTER — Encounter: Payer: Self-pay | Admitting: Family Medicine

## 2017-01-05 VITALS — BP 124/78 | HR 88 | Temp 97.5°F | Resp 18 | Ht 63.0 in | Wt 191.1 lb

## 2017-01-05 DIAGNOSIS — E890 Postprocedural hypothyroidism: Secondary | ICD-10-CM | POA: Diagnosis not present

## 2017-01-05 DIAGNOSIS — F329 Major depressive disorder, single episode, unspecified: Secondary | ICD-10-CM

## 2017-01-05 DIAGNOSIS — F419 Anxiety disorder, unspecified: Secondary | ICD-10-CM | POA: Diagnosis not present

## 2017-01-05 DIAGNOSIS — Z309 Encounter for contraceptive management, unspecified: Secondary | ICD-10-CM

## 2017-01-05 DIAGNOSIS — N912 Amenorrhea, unspecified: Secondary | ICD-10-CM | POA: Diagnosis not present

## 2017-01-05 DIAGNOSIS — G8929 Other chronic pain: Secondary | ICD-10-CM

## 2017-01-05 DIAGNOSIS — M25562 Pain in left knee: Secondary | ICD-10-CM

## 2017-01-05 DIAGNOSIS — I10 Essential (primary) hypertension: Secondary | ICD-10-CM

## 2017-01-05 DIAGNOSIS — E663 Overweight: Secondary | ICD-10-CM

## 2017-01-05 DIAGNOSIS — K219 Gastro-esophageal reflux disease without esophagitis: Secondary | ICD-10-CM | POA: Diagnosis not present

## 2017-01-05 DIAGNOSIS — M25561 Pain in right knee: Secondary | ICD-10-CM | POA: Insufficient documentation

## 2017-01-05 MED ORDER — AZELASTINE HCL 0.1 % NA SOLN: 2.0000 | Freq: Two times a day (BID) | NASAL | 12 refills | Status: DC

## 2017-01-05 MED ORDER — MONTELUKAST SODIUM 10 MG PO TABS
10.0000 mg | ORAL_TABLET | Freq: Every day | ORAL | 3 refills | Status: DC
Start: 2017-01-05 — End: 2017-06-28

## 2017-01-05 NOTE — Assessment & Plan Note (Signed)
1 year h/o increased pain, swelling and instability, right worse than left , Dr Aline Brochure to re evaluate

## 2017-01-05 NOTE — Assessment & Plan Note (Signed)
Updated lab needed today 

## 2017-01-05 NOTE — Assessment & Plan Note (Signed)
Stable now taking Wellbutrin from neurologist

## 2017-01-05 NOTE — Progress Notes (Signed)
Martha Montgomery     MRN: [id number]      DOB: 03-22-1981   HPI [name] is here for follow up and re-evaluation of chronic medical conditions, medication management and review of any available recent lab and radiology data.  Preventive health is updated, specifically  Cancer screening and Immunization.   Questions or concerns regarding consultations or procedures which the PT has had in the interim are  Addressed.Has only been following with neurology for seizure and headache management  The PT denies any adverse reactions to current medications since the last visit.  6 month, h/o increased back and knee pain worse when she first gets up in the morning. Bloating with eating and even drinking water and digestive issues sees food particles  In stool, worse in past 1 year, also c/o solid and liquid dysphagia. Needs depo over 2 weeks past due  ROS Denies recent fever or chills. Denies sinus pressure, nasal congestion, ear pain or sore throat. Denies chest congestion, productive cough or wheezing. Denies chest pains, palpitations and leg swelling   Denies dysuria, frequency, hesitancy or incontinence.No menses and depo is 2 weeks late  Denies  uncontrolled headaches, seizures, numbness, or tingling. Denies depression, anxiety or insomnia. Denies skin break down or rash.   PE  BP 124/78 (BP Location: Left Arm, Patient Position: Sitting, Cuff Size: Normal)   Pulse 88   Temp 97.5 F (36.4 C) (Temporal)   Resp 18   Ht 5\' 3"  (1.6 m)   Wt 191 lb 1.3 oz (86.7 kg)   SpO2 100%   BMI 33.85 kg/m   Patient alert and oriented and in no cardiopulmonary distress.  HEENT: No facial asymmetry, EOMI,   oropharynx pink and moist.  Neck supple no JVD, no mass.  Chest: Clear to auscultation bilaterally.  CVS: S1, S2 no murmurs, no S3.Regular rate.  ABD: Soft no localized guarding or rebound, mild epigastric tenderness Ext: No edema  MS: Adequate though reduced ROM lumbar spine, normal in  shoulders and  hips and reduced in  knees.  Skin: Intact, no ulcerations or rash noted.  Psych: Good eye contact, normal affect. Memory intact not anxious or depressed appearing.  CNS: CN 2-12 intact, power,  normal throughout.no focal deficits noted.   Assessment & Plan  Bilateral knee pain 1 year h/o increased pain, swelling and instability, right worse than left , Dr Aline Brochure to re evaluate  GERD (gastroesophageal reflux disease) increased dyspepsia, dysphagia for both solid and liquird and bloating  Refer to GI  HTN (hypertension), benign Controlled, no change in medication DASH diet and commitment to daily physical activity for a minimum of 30 minutes discussed and encouraged, as a part of hypertension management. The importance of attaining a healthy weight is also discussed.  BP/Weight 01/05/2017 09/02/2016 08/26/2016 07/22/2016 05/27/2016 7/84/6962 04/01/2840  Systolic BP 324 401 027 253 664 403 474  Diastolic BP 78 62 259 90 563 84 94  Wt. (Lbs) 191.08 183 184 184 180 160 163  BMI 33.85 32.42 32.59 32.59 31.89 28.34 28.88       Knee pain Worse in past 6 months right worse than left with instability, refer to ortho for re eval, Dr Aline Brochure has operated on the left knee in the past  Post-surgical hypothyroidism Updated lab needed today  Overweight (BMI 25.0-29.9) Deteriorated. Patient re-educated about  the importance of commitment to a  minimum of 150 minutes of exercise per week.  The importance of healthy food choices with portion  control discussed. Encouraged to start a food diary, count calories and to consider  joining a support group. Sample diet sheets offered. Goals set by the patient for the next several months.   Weight /BMI 01/05/2017 09/02/2016 08/26/2016  WEIGHT 191 lb 1.3 oz 183 lb 184 lb  HEIGHT 5\' 3"  5\' 3"  -  BMI 33.85 kg/m2 32.42 kg/m2 32.59 kg/m2      Anxiety and depression Stable now taking Wellbutrin from neurologist  Amenorrhea Urine  pregnancy test is negative, depo provera administered

## 2017-01-05 NOTE — Assessment & Plan Note (Signed)
Worse in past 6 months right worse than left with instability, refer to ortho for re eval, Dr Aline Brochure has operated on the left knee in the past

## 2017-01-05 NOTE — Assessment & Plan Note (Signed)
Controlled, no change in medication DASH diet and commitment to daily physical activity for a minimum of 30 minutes discussed and encouraged, as a part of hypertension management. The importance of attaining a healthy weight is also discussed.  BP/Weight 01/05/2017 09/02/2016 08/26/2016 07/22/2016 05/27/2016 4/92/0100 01/26/2196  Systolic BP 588 325 498 264 158 309 407  Diastolic BP 78 62 680 90 881 84 94  Wt. (Lbs) 191.08 183 184 184 180 160 163  BMI 33.85 32.42 32.59 32.59 31.89 28.34 28.88

## 2017-01-05 NOTE — Assessment & Plan Note (Signed)
increased dyspepsia, dysphagia for both solid and liquird and bloating  Refer to GI

## 2017-01-05 NOTE — Patient Instructions (Signed)
F/u in 5.5 months, call if you need me sooner  Nurse visit for depo provera in 12 weeks   Urine pregnacy test today and depo provera today if negative  Chjem 7 , TSH, magneium and calcium today  Please work on good  health habits so that your health will improve. 1. Commitment to daily physical activity for 30 to 60  minutes, if you are able to do this.  2. Commitment to wise food choices. Aim for half of your  food intake to be vegetable and fruit, one quarter starchy foods, and one quarter protein. Try to eat on a regular schedule  3 meals per day, snacking between meals should be limited to vegetables or fruits or small portions of nuts. 64 ounces of water per day is generally recommended, unless you have specific health conditions, like heart failure or kidney failure where you will need to limit fluid intake.  3. Commitment to sufficient and a  good quality of physical and mental rest daily, generally between 6 to 8 hours per day.  WITH PERSISTANCE AND PERSEVERANCE, THE IMPOSSIBLE , BECOMES THE NORM!  Thank you  for choosing [facility]. We consider it a privelige to serve you.  Delivering excellent health care in a caring and  compassionate way is our goal.  Partnering with you,  so that together we can achieve this goal is our strategy.     You are referred to Dr Aline Brochure and to Dr Oneida Alar  TEN pound weight loss goal, you can do this!

## 2017-01-05 NOTE — Assessment & Plan Note (Signed)
Urine pregnancy test is negative, depo provera administered

## 2017-01-05 NOTE — Assessment & Plan Note (Signed)
Deteriorated. Patient re-educated about  the importance of commitment to a  minimum of 150 minutes of exercise per week.  The importance of healthy food choices with portion control discussed. Encouraged to start a food diary, count calories and to consider  joining a support group. Sample diet sheets offered. Goals set by the patient for the next several months.   Weight /BMI 01/05/2017 09/02/2016 08/26/2016  WEIGHT 191 lb 1.3 oz 183 lb 184 lb  HEIGHT 5\' 3"  5\' 3"  -  BMI 33.85 kg/m2 32.42 kg/m2 32.59 kg/m2

## 2017-01-06 ENCOUNTER — Encounter: Payer: Self-pay | Admitting: Gastroenterology

## 2017-01-06 DIAGNOSIS — M545 Low back pain: Secondary | ICD-10-CM | POA: Diagnosis not present

## 2017-01-06 DIAGNOSIS — K219 Gastro-esophageal reflux disease without esophagitis: Secondary | ICD-10-CM | POA: Diagnosis not present

## 2017-01-06 DIAGNOSIS — Z79891 Long term (current) use of opiate analgesic: Secondary | ICD-10-CM | POA: Diagnosis not present

## 2017-01-06 DIAGNOSIS — Z309 Encounter for contraceptive management, unspecified: Secondary | ICD-10-CM | POA: Diagnosis not present

## 2017-01-06 DIAGNOSIS — M1990 Unspecified osteoarthritis, unspecified site: Secondary | ICD-10-CM | POA: Diagnosis not present

## 2017-01-06 DIAGNOSIS — G4459 Other complicated headache syndrome: Secondary | ICD-10-CM | POA: Diagnosis not present

## 2017-01-06 DIAGNOSIS — M79606 Pain in leg, unspecified: Secondary | ICD-10-CM | POA: Diagnosis not present

## 2017-01-06 LAB — POCT URINE PREGNANCY: Preg Test, Ur: NEGATIVE

## 2017-01-06 MED ORDER — MEDROXYPROGESTERONE ACETATE 150 MG/ML IM SUSP
150.0000 mg | Freq: Once | INTRAMUSCULAR | Status: AC
  Administered 2017-01-06: 150 mg via INTRAMUSCULAR

## 2017-01-06 NOTE — Addendum Note (Signed)
Addended by: Eual Fines on: 01/06/2017 07:57 AM   Modules accepted: Orders

## 2017-02-24 ENCOUNTER — Emergency Department (HOSPITAL_COMMUNITY)
Admission: EM | Admit: 2017-02-24 | Discharge: 2017-02-24 | Disposition: A | Discharge status: Home / Self Care | Payer: Medicare Other | Attending: Emergency Medicine | Admitting: Emergency Medicine

## 2017-02-24 ENCOUNTER — Encounter (HOSPITAL_COMMUNITY): Payer: Self-pay | Admitting: *Deleted

## 2017-02-24 DIAGNOSIS — M2669 Other specified disorders of temporomandibular joint: Secondary | ICD-10-CM | POA: Insufficient documentation

## 2017-02-24 DIAGNOSIS — Z79899 Other long term (current) drug therapy: Secondary | ICD-10-CM | POA: Insufficient documentation

## 2017-02-24 DIAGNOSIS — M542 Cervicalgia: Secondary | ICD-10-CM | POA: Diagnosis not present

## 2017-02-24 DIAGNOSIS — Z87891 Personal history of nicotine dependence: Secondary | ICD-10-CM | POA: Insufficient documentation

## 2017-02-24 DIAGNOSIS — I10 Essential (primary) hypertension: Secondary | ICD-10-CM | POA: Insufficient documentation

## 2017-02-24 DIAGNOSIS — E039 Hypothyroidism, unspecified: Secondary | ICD-10-CM | POA: Diagnosis not present

## 2017-02-24 DIAGNOSIS — M26649 Arthritis of unspecified temporomandibular joint: Secondary | ICD-10-CM

## 2017-02-24 MED ORDER — KETOROLAC TROMETHAMINE 30 MG/ML IJ SOLN
30.0000 mg | Freq: Once | INTRAMUSCULAR | Status: AC
  Administered 2017-02-24: 30 mg via INTRAMUSCULAR
  Filled 2017-02-24: qty 1

## 2017-02-24 NOTE — Discharge Instructions (Signed)
You were seen today for neck and jaw pain. There is no appreciable lymphadenopathy or abnormality. This could be related to your TMJ. See information in this packet for details. Continue pain medication at home. Monitor for further signs and symptoms of infection. If you have difficulty swallowing, fevers, or any new or worsened symptoms you should be reevaluated.

## 2017-02-24 NOTE — ED Triage Notes (Signed)
Pt c/o pain to right side of her neck radiating to her ear and pt states she notices her voice has become horse; pt states it started x 4 days ago

## 2017-02-24 NOTE — ED Provider Notes (Signed)
[facility] DEPT Provider Note   CSN: [id number] Arrival date & time: 02/24/17  0019     History   Chief Complaint Chief Complaint  Patient presents with  . Neck Pain    HPI Martha Montgomery is a 36 y.o. female.  HPI  This is a 36 year old female with a history of migraines, thyroid disease, hypertension who presents with right-sided jaw and neck pain. Patient reports onset of symptoms 4 days ago. She has pain in the right side of her neck that radiates into her right ear. She has taken her hydrocodone at home with some relief. She states the pain is sometimes worse with swallowing. She denies any sore throat, congestion, ear fullness. No upper respiratory symptoms. Denies fevers.  Past Medical History:  Diagnosis Date  . Anxiety   . Anxiety disorder   . Arthritis    "knees, joints, hands, toes" (09/28/2013)  . Depression   . Gastritis nov. 2011   EGD by Dr. Oneida Alar, negative h pylori  . GERD (gastroesophageal reflux disease)   . Gout   . Hypertension x 6 years   . Hypothyroidism   . Migraines    "15/month average" (09/28/2013)  . Thyroid goiter     Patient Active Problem List   Diagnosis Date Noted  . Bilateral knee pain 01/05/2017  . Amenorrhea 01/05/2017  . Bilateral hip pain 07/22/2016  . Sleep disorder breathing 03/08/2016  . Overweight (BMI 25.0-29.9) 06/30/2015  . Post-surgical hypothyroidism 06/02/2014  . HTN (hypertension), benign 06/30/2012  . Knee pain 06/02/2012  . Patellar malalignment syndrome 03/09/2012  . IBS (irritable bowel syndrome) 08/29/2011  . GERD (gastroesophageal reflux disease) 08/29/2011  . BACK PAIN 09/24/2010  . Migraine with aura 04/02/2010  . Allergic rhinitis 06/16/2009  . Anxiety and depression 10/18/2008    Past Surgical History:  Procedure Laterality Date  . CESAREAN SECTION  2008  . CHONDROPLASTY  04/02/2012   Procedure: CHONDROPLASTY;  Surgeon: Carole Civil, MD;  Location: [facility];  Service: Orthopedics;  Laterality:  Left;  of left patella  . COLONOSCOPY  May 2012   Dr. Oneida Alar: normal colon, small internal hemorrhoids  . INCISION AND DRAINAGE ABSCESS  1997; 2005   "under right buttocks; under left arm"  . THYROIDECTOMY N/A 09/28/2013   Procedure: TOTAL THYROIDECTOMY;  Surgeon: Ascencion Dike, MD;  Location: [facility];  Service: ENT;  Laterality: N/A;  . TOTAL THYROIDECTOMY  09/27/2013  . UPPER GI ENDOSCOPY      OB History    No data available       Home Medications    Prior to Admission medications   Medication Sig Start Date End Date Taking? Authorizing Provider  amLODipine (NORVASC) 2.5 MG tablet TAKE ONE TABLET BY MOUTH ONCE DAILY 08/28/16   Fayrene Helper, MD  azelastine (ASTELIN) 0.1 % nasal spray Place 2 sprays into both nostrils 2 (two) times daily. Use in each nostril as directed 01/05/17   Fayrene Helper, MD  hydrochlorothiazide (MICROZIDE) 12.5 MG capsule TAKE ONE CAPSULE BY MOUTH ONCE DAILY 12/29/16   Fayrene Helper, MD  levothyroxine (SYNTHROID, LEVOTHROID) 150 MCG tablet Take 1 tablet (150 mcg total) by mouth daily. 07/23/16   Fayrene Helper, MD  metoprolol (LOPRESSOR) 50 MG tablet TAKE ONE TABLET BY MOUTH TWICE DAILY 08/28/16   Fayrene Helper, MD  montelukast (SINGULAIR) 10 MG tablet Take 1 tablet (10 mg total) by mouth at bedtime. 01/05/17   Fayrene Helper, MD  potassium chloride (K-DUR)  10 MEQ tablet Take 1 tablet (10 mEq total) by mouth daily. 07/22/16   Fayrene Helper, MD  pregabalin (LYRICA) 75 MG capsule Take 300 mg by mouth 2 (two) times daily.     [provider]  triamcinolone cream (KENALOG) 0.1 % Apply 1 application topically 2 (two) times daily. 01/04/16   Lily Kocher, PA-C  Vitamin D, Ergocalciferol, (DRISDOL) 50000 units CAPS capsule Take 1 capsule (50,000 Units total) by mouth every 7 (seven) days. 08/08/16   Fayrene Helper, MD    Family History Family History  Problem Relation Age of Onset  . Arthritis Mother   . Migraines Mother   .  Hypertension Mother   . Cancer Maternal Grandmother        lung  . Asthma Unknown        family history   . Colon cancer Neg Hx     Social History Social History  Substance Use Topics  . Smoking status: Former Smoker    Packs/day: 1.00    Years: 5.00    Types: Cigarettes    Quit date: 09/11/2010  . Smokeless tobacco: Never Used     Comment: QUIT SMOKING X 3 YEARS AGO  . Alcohol use No     Comment:  "have a drink a couple times per yr"     Allergies   Codeine; Ibuprofen; and Soybean-containing drug products   Review of Systems Review of Systems  Constitutional: Negative for fever.  HENT: Positive for ear pain. Negative for congestion, rhinorrhea, sinus pressure and trouble swallowing.   Respiratory: Negative for cough and shortness of breath.   Cardiovascular: Negative for chest pain.  Gastrointestinal: Negative for abdominal pain, nausea and vomiting.  Musculoskeletal: Positive for neck pain.  All other systems reviewed and are negative.    Physical Exam Updated Vital Signs BP (!) 153/103 (BP Location: Right Arm)   Pulse 87   Temp 98.3 F (36.8 C) (Oral)   Resp 18   Ht 5\' 3"  (1.6 m)   Wt 83.9 kg (185 lb)   SpO2 99%   BMI 32.77 kg/m   Physical Exam  Constitutional: She is oriented to person, place, and time. She appears well-developed and well-nourished.  HENT:  Head: Normocephalic and atraumatic.  Mouth/Throat: No oropharyngeal exudate.  Braces noted, otherwise normal dentition, tenderness to palpation right TMJ, mild effusions behind bilateral ears without significant erythema, intact light reflex, uvula midline, no oral pharyngeal swelling or erythema noted  Eyes: Pupils are equal, round, and reactive to light.  Neck: Normal range of motion. Neck supple.  Cardiovascular: Normal rate, regular rhythm and normal heart sounds.   No murmur heard. Pulmonary/Chest: Effort normal and breath sounds normal. No stridor. No respiratory distress. She has no wheezes.    Lymphadenopathy:    She has no cervical adenopathy.  Neurological: She is alert and oriented to person, place, and time.  Skin: Skin is warm and dry.  Psychiatric: She has a normal mood and affect.  Nursing note and vitals reviewed.    ED Treatments / Results  Labs (all labs ordered are listed, but only abnormal results are displayed) Labs Reviewed - No data to display  EKG  EKG Interpretation None       Radiology No results found.  Procedures Procedures (including critical care time)  Medications Ordered in ED Medications  ketorolac (TORADOL) 30 MG/ML injection 30 mg (not administered)     Initial Impression / Assessment and Plan / ED Course  I have  reviewed the triage vital signs and the nursing notes.  Pertinent labs & imaging results that were available during my care of the patient were reviewed by me and considered in my medical decision making (see chart for details).     Patient presents with right-sided neck pain and jaw pain that radiates into the right ear. No appreciable cervical adenopathy or swelling noted. She denies upper respiratory symptoms. She has mild effusions behind bilateral ears. No stridor or oropharyngeal abnormalities noted. She does have some tenderness with bite of the right TMJ. This could cause referred pain to the right ear. Patient reports that she does not tolerate anti-inflammatories. Recommend heat or ice as tolerated. Take her home hydrocodone. Monitor for signs and symptoms of progression or worsening.  After history, exam, and medical workup I feel the patient has been appropriately medically screened and is safe for discharge home. Pertinent diagnoses were discussed with the patient. Patient was given return precautions.   Final Clinical Impressions(s) / ED Diagnoses   Final diagnoses:  TMJ arthritis  Neck pain    New Prescriptions New Prescriptions   No medications on file     Merryl Hacker, MD 02/24/17  [phone]

## 2017-03-05 ENCOUNTER — Ambulatory Visit (INDEPENDENT_AMBULATORY_CARE_PROVIDER_SITE_OTHER): Payer: Medicare Other | Admitting: Gastroenterology

## 2017-03-05 ENCOUNTER — Encounter: Payer: Self-pay | Admitting: Gastroenterology

## 2017-03-05 VITALS — BP 126/88 | HR 80 | Temp 98.2°F | Ht 63.0 in | Wt 196.2 lb

## 2017-03-05 DIAGNOSIS — K5900 Constipation, unspecified: Secondary | ICD-10-CM | POA: Diagnosis not present

## 2017-03-05 DIAGNOSIS — R140 Abdominal distension (gaseous): Secondary | ICD-10-CM | POA: Diagnosis not present

## 2017-03-05 MED ORDER — PANTOPRAZOLE SODIUM 40 MG PO TBEC: 40.0000 mg | DELAYED_RELEASE_TABLET | Freq: Every day | ORAL | 3 refills | Status: DC

## 2017-03-05 NOTE — Progress Notes (Signed)
cc'ed to pcp °

## 2017-03-05 NOTE — Assessment & Plan Note (Signed)
36 year old female with history of chronic constipation likely medication-induced. Needs more aggressive bowel regimen. Reports abdominal bloating and aggravation of reflux with bloating. Likely multifactorial. No abdominal pain or alarm symptoms.   1. Will address constipation: add Linzess 145 mcg once each morning.  2. Start Protonix once each day before breakfast 3. Consider GES if constipation improved but still with vague bloating: with chronic opioids, raises concern for delayed stomach emptying.  4. Follow-up in 6 weeks.

## 2017-03-05 NOTE — Progress Notes (Signed)
Referring Provider: Fayrene Helper, MD Primary Care Physician:  Fayrene Helper, MD Primary GI: Dr. Oneida Alar   Chief Complaint  Patient presents with  . Gastroesophageal Reflux    HPI:   Martha Montgomery is a 36 y.o. female presenting today with a history of IBS-C, last seen in July 2015. She was prescribed Linzess 145 mcg at that appt and advised to follow-up in 6-8 weeks but has been lost to follow-up. Last colonoscopy 2012 with internal hemorrhoids.   Presents today with upper abdominal bloating with eating and drinking. If bloated while eating or drinking, then she will have reflux symptoms. No nausea. Symptoms present for a few years now. No abdominal pain. Gallbladder remains in situ. Feels full off small amounts of food. Norco for about a year, Tramadol for several years. Omeprazole historically.   Still having issues with constipation. BM once every 3 days. Doesn't feel productive with bowel movements. Big pieces of stool, sometimes hard. Feels like she has a slow digestive system. Trying to include more fiber in diet.   Past Medical History:  Diagnosis Date  . Anxiety   . Anxiety disorder   . Arthritis    "knees, joints, hands, toes" (09/28/2013)  . Depression   . Gastritis nov. 2011   EGD by Dr. Oneida Alar, negative h pylori  . GERD (gastroesophageal reflux disease)   . Gout   . Hypertension x 6 years   . Hypothyroidism   . Migraines    "15/month average" (09/28/2013)  . Thyroid goiter     Past Surgical History:  Procedure Laterality Date  . CESAREAN SECTION  2008  . CHONDROPLASTY  04/02/2012   Procedure: CHONDROPLASTY;  Surgeon: Carole Civil, MD;  Location: [facility];  Service: Orthopedics;  Laterality: Left;  of left patella  . COLONOSCOPY  May 2012   Dr. Oneida Alar: normal colon, small internal hemorrhoids  . INCISION AND DRAINAGE ABSCESS  1997; 2005   "under right buttocks; under left arm"  . THYROIDECTOMY N/A 09/28/2013   Procedure: TOTAL THYROIDECTOMY;   Surgeon: Ascencion Dike, MD;  Location: [facility];  Service: ENT;  Laterality: N/A;  . TOTAL THYROIDECTOMY  09/27/2013  . UPPER GI ENDOSCOPY      Current Outpatient Prescriptions  Medication Sig Dispense Refill  . amLODipine (NORVASC) 2.5 MG tablet TAKE ONE TABLET BY MOUTH ONCE DAILY 90 tablet 1  . azelastine (ASTELIN) 0.1 % nasal spray Place 2 sprays into both nostrils 2 (two) times daily. Use in each nostril as directed 30 mL 12  . buPROPion (WELLBUTRIN XL) 150 MG 24 hr tablet     . hydrochlorothiazide (MICROZIDE) 12.5 MG capsule TAKE ONE CAPSULE BY MOUTH ONCE DAILY 90 capsule 1  . HYDROcodone-acetaminophen (NORCO) 7.5-325 MG tablet     . levothyroxine (SYNTHROID, LEVOTHROID) 150 MCG tablet Take 1 tablet (150 mcg total) by mouth daily. 30 tablet 3  . LYRICA 75 MG capsule Take 75 mg by mouth daily.     . metoprolol (LOPRESSOR) 50 MG tablet TAKE ONE TABLET BY MOUTH TWICE DAILY 180 tablet 1  . montelukast (SINGULAIR) 10 MG tablet Take 1 tablet (10 mg total) by mouth at bedtime. 30 tablet 3  . potassium chloride (K-DUR) 10 MEQ tablet Take 1 tablet (10 mEq total) by mouth daily. 30 tablet 3  . traMADol (ULTRAM) 50 MG tablet     . Vitamin D, Ergocalciferol, (DRISDOL) 50000 units CAPS capsule Take 1 capsule (50,000 Units total) by mouth every 7 (seven)  days. 4 capsule 5  . pantoprazole (PROTONIX) 40 MG tablet Take 1 tablet (40 mg total) by mouth daily. Take 30 minutes before breakfast 30 tablet 3   No current facility-administered medications for this visit.     Allergies as of 03/05/2017 - Review Complete 03/05/2017  Allergen Reaction Noted  . Codeine Itching   . Ibuprofen Other (See Comments) 09/02/2016  . Soybean-containing drug products Other (See Comments) 12/10/2010    Family History  Problem Relation Age of Onset  . Arthritis Mother   . Migraines Mother   . Hypertension Mother   . Cancer Maternal Grandmother        lung  . Asthma Unknown        family history   . Colon cancer Neg Hx       Social History   Social History  . Marital status: Married    Spouse name: N/A  . Number of children: 1  . Years of education: N/A   Occupational History  . enrollment coordinator [employer]   .  Unemployed   Social History Main Topics  . Smoking status: Former Smoker    Packs/day: 1.00    Years: 5.00    Types: Cigarettes    Quit date: 09/11/2010  . Smokeless tobacco: Never Used     Comment: QUIT SMOKING X 3 YEARS AGO  . Alcohol use No     Comment:  "have a drink a couple times per yr"  . Drug use: No  . Sexual activity: Not Currently    Partners: Male    Birth control/ protection: Other-see comments, Injection     Comment: Depo   Other Topics Concern  . None   Social History Narrative   Pt is currently on medicaid. From [place] and came to [place] in mid 2010    Review of Systems: As mentioned in HPI   Physical Exam: BP 126/88   Pulse 80   Temp 98.2 F (36.8 C) (Oral)   Ht 5\' 3"  (1.6 m)   Wt 196 lb 3.2 oz (89 kg)   BMI 34.76 kg/m  General:   Alert and oriented. No distress noted. Pleasant and cooperative.  Head:  Normocephalic and atraumatic. Eyes:  Conjuctiva clear without scleral icterus. Mouth:  Oral mucosa pink and moist. Good dentition.  Heart:  S1, S2 present without murmurs, rubs, or gallops. Regular rate and rhythm. Abdomen:  +BS, soft, non-tender and non-distended. No rebound or guarding. No HSM or masses noted. Msk:  Symmetrical without gross deformities. Normal posture. Extremities:  Without edema. Neurologic:  Alert and  oriented x4;  grossly normal neurologically. Psych:  Alert and cooperative. Normal mood and affect.  Lab Results  Component Value Date   ALT 11 08/07/2016   AST 16 08/07/2016   ALKPHOS 51 08/07/2016   BILITOT 0.5 08/07/2016   Lab Results  Component Value Date   WBC 6.1 08/07/2016   HGB 13.3 08/07/2016   HCT 40.2 08/07/2016   MCV 92.0 08/07/2016   PLT 456 (H) 08/07/2016   Lab Results  Component  Value Date   TSH 4.24 08/07/2016

## 2017-03-05 NOTE — Patient Instructions (Addendum)
For constipation: start taking Linzess capsule once each morning, 30 minutes before breakfast. Call me if this needs to be increased or decreased. Sometimes people have loose stool for a few days, but this should get better.  Start taking Protonix once each morning, 30 minutes before breakfast. I sent this to your pharmacy.  Call me in about a week with how you are doing. We may need to do further tests.  I will see you in 6 weeks!

## 2017-03-12 ENCOUNTER — Encounter: Payer: Self-pay | Admitting: Orthopedic Surgery

## 2017-03-18 ENCOUNTER — Telehealth: Payer: Self-pay

## 2017-03-18 NOTE — Telephone Encounter (Signed)
Called pt to schedule Medicare Annual Wellness Visit. -nr  

## 2017-04-30 ENCOUNTER — Ambulatory Visit (INDEPENDENT_AMBULATORY_CARE_PROVIDER_SITE_OTHER): Payer: Medicare Other

## 2017-04-30 DIAGNOSIS — Z309 Encounter for contraceptive management, unspecified: Secondary | ICD-10-CM | POA: Diagnosis not present

## 2017-04-30 LAB — POCT URINE PREGNANCY: Preg Test, Ur: NEGATIVE

## 2017-04-30 MED ORDER — MEDROXYPROGESTERONE ACETATE 150 MG/ML IM SUSP
150.0000 mg | Freq: Once | INTRAMUSCULAR | Status: AC
  Administered 2017-04-30: 150 mg via INTRAMUSCULAR

## 2017-05-01 DIAGNOSIS — Z79899 Other long term (current) drug therapy: Secondary | ICD-10-CM | POA: Diagnosis not present

## 2017-05-01 DIAGNOSIS — M545 Low back pain: Secondary | ICD-10-CM | POA: Diagnosis not present

## 2017-05-01 DIAGNOSIS — M1990 Unspecified osteoarthritis, unspecified site: Secondary | ICD-10-CM | POA: Diagnosis not present

## 2017-05-01 DIAGNOSIS — Z79891 Long term (current) use of opiate analgesic: Secondary | ICD-10-CM | POA: Diagnosis not present

## 2017-05-01 DIAGNOSIS — M79606 Pain in leg, unspecified: Secondary | ICD-10-CM | POA: Diagnosis not present

## 2017-05-07 ENCOUNTER — Ambulatory Visit: Payer: Medicare Other | Admitting: Gastroenterology

## 2017-05-07 ENCOUNTER — Telehealth: Payer: Self-pay | Admitting: Gastroenterology

## 2017-05-07 ENCOUNTER — Encounter: Payer: Self-pay | Admitting: Gastroenterology

## 2017-05-07 NOTE — Telephone Encounter (Signed)
Patient was a no show and letter sent  °

## 2017-05-31 ENCOUNTER — Other Ambulatory Visit: Payer: Self-pay | Admitting: Family Medicine

## 2017-05-31 DIAGNOSIS — I10 Essential (primary) hypertension: Secondary | ICD-10-CM

## 2017-06-10 DIAGNOSIS — M25561 Pain in right knee: Secondary | ICD-10-CM | POA: Diagnosis not present

## 2017-06-10 DIAGNOSIS — M25562 Pain in left knee: Secondary | ICD-10-CM | POA: Diagnosis not present

## 2017-06-10 DIAGNOSIS — I10 Essential (primary) hypertension: Secondary | ICD-10-CM | POA: Diagnosis not present

## 2017-06-10 DIAGNOSIS — G8929 Other chronic pain: Secondary | ICD-10-CM | POA: Diagnosis not present

## 2017-06-10 LAB — COMPLETE METABOLIC PANEL WITH GFR
AG Ratio: 1.5 (calc) (ref 1.0–2.5)
ALKALINE PHOSPHATASE (APISO): 63 U/L (ref 33–115)
ALT: 12 U/L (ref 6–29)
AST: 13 U/L (ref 10–30)
Albumin: 4 g/dL (ref 3.6–5.1)
BUN: 8 mg/dL (ref 7–25)
CO2: 27 mmol/L (ref 20–32)
CREATININE: 0.76 mg/dL (ref 0.50–1.10)
Calcium: 8.3 mg/dL — ABNORMAL LOW (ref 8.6–10.2)
Chloride: 107 mmol/L (ref 98–110)
GFR, Est African American: 118 mL/min/{1.73_m2} (ref >=60)
GFR, Est Non African American: 102 mL/min/{1.73_m2} (ref >=60)
GLOBULIN: 2.7 g/dL (ref 1.9–3.7)
GLUCOSE: 78 mg/dL (ref 65–139)
Potassium: 3.9 mmol/L (ref 3.5–5.3)
SODIUM: 141 mmol/L (ref 135–146)
Total Bilirubin: 0.3 mg/dL (ref 0.2–1.2)
Total Protein: 6.7 g/dL (ref 6.1–8.1)

## 2017-06-10 LAB — TSH: TSH: 9.02 mIU/L — ABNORMAL HIGH

## 2017-06-10 LAB — MAGNESIUM: MAGNESIUM: 2.4 mg/dL (ref 1.5–2.5)

## 2017-06-22 ENCOUNTER — Ambulatory Visit (INDEPENDENT_AMBULATORY_CARE_PROVIDER_SITE_OTHER): Payer: Medicare Other

## 2017-06-22 ENCOUNTER — Ambulatory Visit (INDEPENDENT_AMBULATORY_CARE_PROVIDER_SITE_OTHER): Payer: Medicare Other | Admitting: Family Medicine

## 2017-06-22 ENCOUNTER — Encounter: Payer: Self-pay | Admitting: Family Medicine

## 2017-06-22 VITALS — BP 130/90 | HR 78 | Resp 16 | Ht 63.0 in | Wt 205.0 lb

## 2017-06-22 VITALS — BP 120/80 | HR 87 | Temp 98.8°F | Resp 16 | Ht 63.0 in | Wt 205.2 lb

## 2017-06-22 DIAGNOSIS — Z23 Encounter for immunization: Secondary | ICD-10-CM

## 2017-06-22 DIAGNOSIS — E890 Postprocedural hypothyroidism: Secondary | ICD-10-CM

## 2017-06-22 DIAGNOSIS — G8929 Other chronic pain: Secondary | ICD-10-CM | POA: Diagnosis not present

## 2017-06-22 DIAGNOSIS — I10 Essential (primary) hypertension: Secondary | ICD-10-CM | POA: Diagnosis not present

## 2017-06-22 DIAGNOSIS — Z0000 Encounter for general adult medical examination without abnormal findings: Secondary | ICD-10-CM

## 2017-06-22 DIAGNOSIS — M25561 Pain in right knee: Secondary | ICD-10-CM

## 2017-06-22 DIAGNOSIS — M25562 Pain in left knee: Secondary | ICD-10-CM

## 2017-06-22 MED ORDER — LEVOTHYROXINE SODIUM 200 MCG PO TABS: 200.0000 ug | ORAL_TABLET | Freq: Every day | ORAL | 4 refills | Status: DC

## 2017-06-22 NOTE — Patient Instructions (Addendum)
Martha Montgomery , Thank you for taking time to come for your Medicare Wellness Visit. I appreciate your ongoing commitment to your health goals. Please review the following plan we discussed and let me know if I can assist you in the future.   Screening recommendations/referrals: Colonoscopy: Discuss with PCP- we usually start those at age 35 Mammogram: Discuss with PCP- we usually start those at age 83 Bone Density: Discuss with PCP Recommended yearly ophthalmology/optometry visit for glaucoma screening and checkup Recommended yearly dental visit for hygiene and checkup  Vaccinations: Influenza vaccine: Today Pneumococcal vaccine: Discuss with PCP- we start this at age 52 unless otherwise indicated Tdap vaccine: 2020 Shingles vaccine: Discuss with PCP- we start this at age 4 unless otherwise indicated    Advanced directives: Discuss in office.   Conditions/risks identified: None  Next appointment: As directed by Dr. Moshe Cipro after visit today.  Preventive Care 40-64 Years, Female Preventive care refers to lifestyle choices and visits with your health care provider that can promote health and wellness. What does preventive care include?  A yearly physical exam. This is also called an annual well check.  Dental exams once or twice a year.  Routine eye exams. Ask your health care provider how often you should have your eyes checked.  Personal lifestyle choices, including:  Daily care of your teeth and gums.  Regular physical activity.  Eating a healthy diet.  Avoiding tobacco and drug use.  Limiting alcohol use.  Practicing safe sex.  Taking low-dose aspirin daily starting at age 29.  Taking vitamin and mineral supplements as recommended by your health care provider. What happens during an annual well check? The services and screenings done by your health care provider during your annual well check will depend on your age, overall health, lifestyle risk factors, and family  history of disease. Counseling  Your health care provider may ask you questions about your:  Alcohol use.  Tobacco use.  Drug use.  Emotional well-being.  Home and relationship well-being.  Sexual activity.  Eating habits.  Work and work Statistician.  Method of birth control.  Menstrual cycle.  Pregnancy history. Screening  You may have the following tests or measurements:  Height, weight, and BMI.  Blood pressure.  Lipid and cholesterol levels. These may be checked every 5 years, or more frequently if you are over 13 years old.  Skin check.  Lung cancer screening. You may have this screening every year starting at age 62 if you have a 30-pack-year history of smoking and currently smoke or have quit within the past 15 years.  Fecal occult blood test (FOBT) of the stool. You may have this test every year starting at age 56.  Flexible sigmoidoscopy or colonoscopy. You may have a sigmoidoscopy every 5 years or a colonoscopy every 10 years starting at age 32.  Hepatitis C blood test.  Hepatitis B blood test.  Sexually transmitted disease (STD) testing.  Diabetes screening. This is done by checking your blood sugar (glucose) after you have not eaten for a while (fasting). You may have this done every 1-3 years.  Mammogram. This may be done every 1-2 years. Talk to your health care provider about when you should start having regular mammograms. This may depend on whether you have a family history of breast cancer.  BRCA-related cancer screening. This may be done if you have a family history of breast, ovarian, tubal, or peritoneal cancers.  Pelvic exam and Pap test. This may be done every 3  years starting at age 3. Starting at age 55, this may be done every 5 years if you have a Pap test in combination with an HPV test.  Bone density scan. This is done to screen for osteoporosis. You may have this scan if you are at high risk for osteoporosis. Discuss your test  results, treatment options, and if necessary, the need for more tests with your health care provider. Vaccines  Your health care provider may recommend certain vaccines, such as:  Influenza vaccine. This is recommended every year.  Tetanus, diphtheria, and acellular pertussis (Tdap, Td) vaccine. You may need a Td booster every 10 years.  Zoster vaccine. You may need this after age 59.  Pneumococcal 13-valent conjugate (PCV13) vaccine. You may need this if you have certain conditions and were not previously vaccinated.  Pneumococcal polysaccharide (PPSV23) vaccine. You may need one or two doses if you smoke cigarettes or if you have certain conditions. Talk to your health care provider about which screenings and vaccines you need and how often you need them. This information is not intended to replace advice given to you by your health care provider. Make sure you discuss any questions you have with your health care provider. Document Released: 08/10/2015 Document Revised: 04/02/2016 Document Reviewed: 05/15/2015 Elsevier Interactive Patient Education  2017 [place] Prevention in the Home Falls can cause injuries. They can happen to people of all ages. There are many things you can do to make your home safe and to help prevent falls. What can I do on the outside of my home?  Regularly fix the edges of walkways and driveways and fix any cracks.  Remove anything that might make you trip as you walk through a door, such as a raised step or threshold.  Trim any bushes or trees on the path to your home.  Use bright outdoor lighting.  Clear any walking paths of anything that might make someone trip, such as rocks or tools.  Regularly check to see if handrails are loose or broken. Make sure that both sides of any steps have handrails.  Any raised decks and porches should have guardrails on the edges.  Have any leaves, snow, or ice cleared regularly.  Use sand or salt on  walking paths during winter.  Clean up any spills in your garage right away. This includes oil or grease spills. What can I do in the bathroom?  Use night lights.  Install grab bars by the toilet and in the tub and shower. Do not use towel bars as grab bars.  Use non-skid mats or decals in the tub or shower.  If you need to sit down in the shower, use a plastic, non-slip stool.  Keep the floor dry. Clean up any water that spills on the floor as soon as it happens.  Remove soap buildup in the tub or shower regularly.  Attach bath mats securely with double-sided non-slip rug tape.  Do not have throw rugs and other things on the floor that can make you trip. What can I do in the bedroom?  Use night lights.  Make sure that you have a light by your bed that is easy to reach.  Do not use any sheets or blankets that are too big for your bed. They should not hang down onto the floor.  Have a firm chair that has side arms. You can use this for support while you get dressed.  Do not have throw rugs  and other things on the floor that can make you trip. What can I do in the kitchen?  Clean up any spills right away.  Avoid walking on wet floors.  Keep items that you use a lot in easy-to-reach places.  If you need to reach something above you, use a strong step stool that has a grab bar.  Keep electrical cords out of the way.  Do not use floor polish or wax that makes floors slippery. If you must use wax, use non-skid floor wax.  Do not have throw rugs and other things on the floor that can make you trip. What can I do with my stairs?  Do not leave any items on the stairs.  Make sure that there are handrails on both sides of the stairs and use them. Fix handrails that are broken or loose. Make sure that handrails are as long as the stairways.  Check any carpeting to make sure that it is firmly attached to the stairs. Fix any carpet that is loose or worn.  Avoid having throw  rugs at the top or bottom of the stairs. If you do have throw rugs, attach them to the floor with carpet tape.  Make sure that you have a light switch at the top of the stairs and the bottom of the stairs. If you do not have them, ask someone to add them for you. What else can I do to help prevent falls?  Wear shoes that:  Do not have high heels.  Have rubber bottoms.  Are comfortable and fit you well.  Are closed at the toe. Do not wear sandals.  If you use a stepladder:  Make sure that it is fully opened. Do not climb a closed stepladder.  Make sure that both sides of the stepladder are locked into place.  Ask someone to hold it for you, if possible.  Clearly mark and make sure that you can see:  Any grab bars or handrails.  First and last steps.  Where the edge of each step is.  Use tools that help you move around (mobility aids) if they are needed. These include:  Canes.  Walkers.  Scooters.  Crutches.  Turn on the lights when you go into a dark area. Replace any light bulbs as soon as they burn out.  Set up your furniture so you have a clear path. Avoid moving your furniture around.  If any of your floors are uneven, fix them.  If there are any pets around you, be aware of where they are.  Review your medicines with your doctor. Some medicines can make you feel dizzy. This can increase your chance of falling. Ask your doctor what other things that you can do to help prevent falls. This information is not intended to replace advice given to you by your health care provider. Make sure you discuss any questions you have with your health care provider. Document Released: 05/10/2009 Document Revised: 12/20/2015 Document Reviewed: 08/18/2014 Elsevier Interactive Patient Education  2017 [place] for Adults  A healthy lifestyle and preventive care can promote health and wellness. Preventive health guidelines for adults include the following  key practices.  . A routine yearly physical is a good way to check with your health care provider about your health and preventive screening. It is a chance to share any concerns and updates on your health and to receive a thorough exam.  . Visit your dentist for a routine exam and preventive  care every 6 months. Brush your teeth twice a day and floss once a day. Good oral hygiene prevents tooth decay and gum disease.  . The frequency of eye exams is based on your age, health, family medical history, use  of contact lenses, and other factors. Follow your health care provider's ecommendations for frequency of eye exams.  . Eat a healthy diet. Foods like vegetables, fruits, whole grains, low-fat dairy products, and lean protein foods contain the nutrients you need without too many calories. Decrease your intake of foods high in solid fats, added sugars, and salt. Eat the right amount of calories for you. Get information about a proper diet from your health care provider, if necessary.  . Regular physical exercise is one of the most important things you can do for your health. Most adults should get at least 150 minutes of moderate-intensity exercise (any activity that increases your heart rate and causes you to sweat) each week. In addition, most adults need muscle-strengthening exercises on 2 or more days a week.  Silver Sneakers may be a benefit available to you. To determine eligibility, you may visit the website: www.silversneakers.com or contact program at 646-201-4013 Mon-Fri between 8AM-8PM.   . Maintain a healthy weight. The body mass index (BMI) is a screening tool to identify possible weight problems. It provides an estimate of body fat based on height and weight. Your health care provider can find your BMI and can help you achieve or maintain a healthy weight.   For adults 20 years and older: ? A BMI below 18.5 is considered underweight. ? A BMI of 18.5 to 24.9 is normal. ? A BMI of 25  to 29.9 is considered overweight. ? A BMI of 30 and above is considered obese.   . Maintain normal blood lipids and cholesterol levels by exercising and minimizing your intake of saturated fat. Eat a balanced diet with plenty of fruit and vegetables. Blood tests for lipids and cholesterol should begin at age 19 and be repeated every 5 years. If your lipid or cholesterol levels are high, you are over 50, or you are at high risk for heart disease, you may need your cholesterol levels checked more frequently. Ongoing high lipid and cholesterol levels should be treated with medicines if diet and exercise are not working.  . If you smoke, find out from your health care provider how to quit. If you do not use tobacco, please do not start.  . If you choose to drink alcohol, please do not consume more than 2 drinks per day. One drink is considered to be 12 ounces (355 mL) of beer, 5 ounces (148 mL) of wine, or 1.5 ounces (44 mL) of liquor.  . If you are 42-52 years old, ask your health care provider if you should take aspirin to prevent strokes.  . Use sunscreen. Apply sunscreen liberally and repeatedly throughout the day. You should seek shade when your shadow is shorter than you. Protect yourself by wearing long sleeves, pants, a wide-brimmed hat, and sunglasses year round, whenever you are outdoors.  . Once a month, do a whole body skin exam, using a mirror to look at the skin on your back. Tell your health care provider of new moles, moles that have irregular borders, moles that are larger than a pencil eraser, or moles that have changed in shape or color.  Please bring a copy of your POA (Power of Aragon) and/or Living Will to your next appointment.

## 2017-06-22 NOTE — Patient Instructions (Addendum)
F/u Jan 16,call if you need me sooner  Increase synthroid to 200 mcg daily, new script is sent    Fasting lipid, cmp and eGFr, TSH and vit D and cBC January 12   It is important that you exercise regularly at least 30 minutes 5 times a week. If you develop chest pain, have severe difficulty breathing, or feel very tired, stop exercising immediately and seek medical attention  Please work on good  health habits so that your health will improve. 1. Commitment to daily physical activity for 30 to 60  minutes, if you are able to do this.  2. Commitment to wise food choices. Aim for half of your  food intake to be vegetable and fruit, one quarter starchy foods, and one quarter protein. Try to eat on a regular schedule  3 meals per day, snacking between meals should be limited to vegetables or fruits or small portions of nuts. 64 ounces of water per day is generally recommended, unless you have specific health conditions, like heart failure or kidney failure where you will need to limit fluid intake.  3. Commitment to sufficient and a  good quality of physical and mental rest daily, generally between 6 to 8 hours per day.  WITH PERSISTANCE AND PERSEVERANCE, THE IMPOSSIBLE , BECOMES THE NORM!  Blood pressure is elevated today , this will improve with weight loss

## 2017-06-22 NOTE — Progress Notes (Addendum)
Subjective:   Martha Montgomery is a 36 y.o. female who presents for Medicare Annual (Subsequent) preventive examination.  Review of Systems:   Cardiac Risk Factors include: obesity (BMI >30kg/m2);sedentary lifestyle     Objective:     Vitals: BP 120/80 (BP Location: Left Arm, Patient Position: Sitting, Cuff Size: Normal)   Pulse 87   Temp 98.8 F (37.1 C) (Other (Comment))   Resp 16   Ht 5\' 3"  (1.6 m)   Wt 205 lb 4 oz (93.1 kg)   SpO2 96%   BMI 36.36 kg/m   Body mass index is 36.36 kg/m.   Tobacco Social History   Tobacco Use  Smoking Status Former Smoker  . Packs/day: 1.00  . Years: 5.00  . Pack years: 5.00  . Types: Cigarettes  . Last attempt to quit: 09/11/2010  . Years since quitting: 6.8  Smokeless Tobacco Never Used  Tobacco Comment   QUIT SMOKING X 3 YEARS AGO     Counseling given: Not Answered Comment: QUIT SMOKING X 3 YEARS AGO   Past Medical History:  Diagnosis Date  . Anxiety   . Anxiety disorder   . Arthritis    "knees, joints, hands, toes" (09/28/2013)  . Depression   . Gastritis nov. 2011   EGD by Dr. Oneida Alar, negative h pylori  . GERD (gastroesophageal reflux disease)   . Gout   . Hypertension x 6 years   . Hypothyroidism   . Migraines    "15/month average" (09/28/2013)  . Spinal stenosis   . Thyroid goiter    Past Surgical History:  Procedure Laterality Date  . CESAREAN SECTION  2008  . CHONDROPLASTY  04/02/2012   Procedure: CHONDROPLASTY;  Surgeon: Carole Civil, MD;  Location: [facility];  Service: Orthopedics;  Laterality: Left;  of left patella  . COLONOSCOPY  May 2012   Dr. Oneida Alar: normal colon, small internal hemorrhoids  . INCISION AND DRAINAGE ABSCESS  1997; 2005   "under right buttocks; under left arm"  . THYROIDECTOMY N/A 09/28/2013   Procedure: TOTAL THYROIDECTOMY;  Surgeon: Ascencion Dike, MD;  Location: [facility];  Service: ENT;  Laterality: N/A;  . TOTAL THYROIDECTOMY  09/27/2013  . UPPER GI ENDOSCOPY     Family History  Problem  Relation Age of Onset  . Arthritis Mother   . Migraines Mother   . Hypertension Mother   . Cancer Maternal Grandmother        lung  . Asthma Unknown        family history   . Colon cancer Neg Hx    Social History   Substance and Sexual Activity  Sexual Activity Not Currently  . Partners: Male  . Birth control/protection: Other-see comments, Injection   Comment: Depo    Outpatient Encounter Medications as of 06/22/2017  Medication Sig  . amLODipine (NORVASC) 2.5 MG tablet TAKE ONE TABLET BY MOUTH ONCE DAILY  . azelastine (ASTELIN) 0.1 % nasal spray Place 2 sprays into both nostrils 2 (two) times daily. Use in each nostril as directed  . LYRICA 75 MG capsule Take 75 mg by mouth daily.   . metoprolol tartrate (LOPRESSOR) 50 MG tablet TAKE ONE TABLET BY MOUTH TWICE DAILY  . pantoprazole (PROTONIX) 40 MG tablet Take 1 tablet (40 mg total) by mouth daily. Take 30 minutes before breakfast  . potassium chloride (K-DUR) 10 MEQ tablet Take 1 tablet (10 mEq total) by mouth daily.  . traMADol (ULTRAM) 50 MG tablet   . Vitamin  D, Ergocalciferol, (DRISDOL) 50000 units CAPS capsule Take 1 capsule (50,000 Units total) by mouth every 7 (seven) days.  . [DISCONTINUED] hydrochlorothiazide (MICROZIDE) 12.5 MG capsule TAKE ONE CAPSULE BY MOUTH ONCE DAILY  . [DISCONTINUED] levothyroxine (SYNTHROID, LEVOTHROID) 150 MCG tablet Take 1 tablet (150 mcg total) by mouth daily.  . hydrochlorothiazide (MICROZIDE) 12.5 MG capsule TAKE ONE CAPSULE BY MOUTH ONCE DAILY (Patient not taking: Reported on 06/22/2017)  . [DISCONTINUED] buPROPion (WELLBUTRIN XL) 150 MG 24 hr tablet   . [DISCONTINUED] HYDROcodone-acetaminophen (NORCO) 7.5-325 MG tablet   . [DISCONTINUED] levothyroxine (SYNTHROID, LEVOTHROID) 150 MCG tablet TAKE ONE-HALF TABLET BY MOUTH MON.,WED. AND FRI. AND  1  TABLET  ON  TUES.,  THURS.,  SAT.,  AND  SUN. (Patient not taking: Reported on 06/22/2017)  . [DISCONTINUED] montelukast (SINGULAIR) 10 MG tablet  Take 1 tablet (10 mg total) by mouth at bedtime. (Patient not taking: Reported on 06/22/2017)   No facility-administered encounter medications on file as of 06/22/2017.     Activities of Daily Living In your present state of health, do you have any difficulty performing the following activities: 06/22/2017 01/05/2017  Hearing? N N  Vision? N N  Difficulty concentrating or making decisions? N N  Walking or climbing stairs? Y N  Dressing or bathing? N N  Doing errands, shopping? N N  Preparing Food and eating ? N -  Using the Toilet? N -  In the past six months, have you accidently leaked urine? N -  Do you have problems with loss of bowel control? N -  Managing your Medications? N -  Managing your Finances? N -  Housekeeping or managing your Housekeeping? Y -  Some recent data might be hidden    Patient Care Team: Fayrene Helper, MD as PCP - [facility], Marga Melnick, MD (Gastroenterology)    Assessment:     Exercise Activities and Dietary recommendations Current Exercise Habits: Home exercise routine, Type of exercise: walking, Time (Minutes): 15, Frequency (Times/Week): 2, Weekly Exercise (Minutes/Week): 30, Exercise limited by: orthopedic condition(s)  Goals    None     Fall Risk Fall Risk  06/22/2017 05/27/2016  Falls in the past year? No Yes  Number falls in past yr: - 1   Depression Screen PHQ 2/9 Scores 06/22/2017 01/05/2017 09/02/2016 06/28/2015  PHQ - 2 Score 0 0 1 2  PHQ- 9 Score - - 5 9     Cognitive Function     6CIT Screen 06/22/2017  What Year? 0 points  What month? 0 points  What time? 0 points  Count back from 20 0 points  Months in reverse 0 points  Repeat phrase 0 points  Total Score 0    Immunization History  Administered Date(s) Administered  . Hpv 10/18/2008, 03/07/2009  . Influenza Split 03/26/2016  . Influenza Whole 06/07/2009, 03/28/2010  . Influenza,inj,Quad PF,6+ Mos 05/30/2014, 06/28/2015, 06/22/2017  . Influenza-Unspecified  03/28/2013  . Td 03/07/2009   Screening Tests Health Maintenance  Topic Date Due  . TETANUS/TDAP  03/08/2019  . PAP SMEAR  07/23/2019  . INFLUENZA VACCINE  Completed  . HIV Screening  Completed      Plan:      I have personally reviewed and noted the following in the patient's chart:   . Medical and social history . Use of alcohol, tobacco or illicit drugs  . Current medications and supplements . Functional ability and status . Nutritional status . Physical activity . Advanced directives . List of other  physicians . Hospitalizations, surgeries, and ER visits in previous 12 months . Vitals . Screenings to include cognitive, depression, and falls . Referrals and appointments  In addition, I have reviewed and discussed with patient certain preventive protocols, quality metrics, and best practice recommendations. A written personalized care plan for preventive services as well as general preventive health recommendations were provided to patient.     Merceda Elks, LPN  24/05/4642

## 2017-06-28 ENCOUNTER — Encounter: Payer: Self-pay | Admitting: Family Medicine

## 2017-06-28 NOTE — Assessment & Plan Note (Signed)
Reports that this is worsening and intends to schedule an appointment with  ortho

## 2017-06-28 NOTE — Assessment & Plan Note (Signed)
Uncontrolled, needs to resume HCTZ daily DASH diet and commitment to daily physical activity for a minimum of 30 minutes discussed and encouraged, as a part of hypertension management. The importance of attaining a healthy weight is also discussed.  BP/Weight 06/22/2017 06/22/2017 03/05/2017 02/24/2017 01/05/2017 09/02/2016 03/26/9406  Systolic BP 680 881 103 159 458 592 924  Diastolic BP 80 90 88 98 78 62 100  Wt. (Lbs) 205.25 205 196.2 185 191.08 183 184  BMI 36.36 36.31 34.76 32.77 33.85 32.42 32.59   Work on weight loss also

## 2017-06-28 NOTE — Assessment & Plan Note (Signed)
Deteriorated. Patient re-educated about  the importance of commitment to a  minimum of 150 minutes of exercise per week.  The importance of healthy food choices with portion control discussed. Encouraged to start a food diary, count calories and to consider  joining a support group. Sample diet sheets offered. Goals set by the patient for the next several months.   Weight /BMI 06/22/2017 06/22/2017 03/05/2017  WEIGHT 205 lb 4 oz 205 lb 196 lb 3.2 oz  HEIGHT 5\' 3"  5\' 3"  5\' 3"   BMI 36.36 kg/m2 36.31 kg/m2 34.76 kg/m2   Needs to work on weight loss and low sodium diet

## 2017-06-28 NOTE — Progress Notes (Signed)
   Martha Montgomery     MRN: [id number]      DOB: 1981/02/13   HPI Martha Montgomery is here for follow up and re-evaluation of chronic medical conditions, medication management and review of any available recent lab and radiology data.  Preventive health is updated, specifically  Cancer screening and Immunization.   Questions or concerns regarding consultations or procedures which the PT has had in the interim are  addressed. The PT denies any adverse reactions to current medications since the last visit.  There are no new concerns.  There are no specific complaints   ROS Denies recent fever or chills. Denies sinus pressure, nasal congestion, ear pain or sore throat. Denies chest congestion, productive cough or wheezing. Denies chest pains, palpitations and leg swelling Denies abdominal pain, nausea, vomiting,diarrhea or constipation.   Denies dysuria, frequency, hesitancy or incontinence. Denies joint pain, swelling and limitation in mobility. Denies headaches, seizures, numbness, or tingling. Denies depression, anxiety or insomnia. Denies skin break down or rash.   PE  BP 130/90   Pulse 78   Resp 16   Ht 5\' 3"  (1.6 m)   Wt 205 lb (93 kg)   SpO2 98%   BMI 36.31 kg/m   Patient alert and oriented and in no cardiopulmonary distress.  HEENT: No facial asymmetry, EOMI,   oropharynx pink and moist.  Neck supple no JVD, no mass.  Chest: Clear to auscultation bilaterally.  CVS: S1, S2 no murmurs, no S3.Regular rate.  ABD: Soft non tender.   Ext: No edema  MS: Adequate ROM spine, shoulders, hips and knees.  Skin: Intact, no ulcerations or rash noted.  Psych: Good eye contact, normal affect. Memory intact not anxious or depressed appearing.  CNS: CN 2-12 intact, power,  normal throughout.no focal deficits noted.   Assessment & Plan  HTN (hypertension), benign Uncontrolled, needs to resume HCTZ daily DASH diet and commitment to daily physical activity for a minimum of 30  minutes discussed and encouraged, as a part of hypertension management. The importance of attaining a healthy weight is also discussed.  BP/Weight 06/22/2017 06/22/2017 03/05/2017 02/24/2017 01/05/2017 09/02/2016 11/06/8784  Systolic BP 767 209 470 962 836 629 476  Diastolic BP 80 90 88 98 78 62 100  Wt. (Lbs) 205.25 205 196.2 185 191.08 183 184  BMI 36.36 36.31 34.76 32.77 33.85 32.42 32.59   Work on weight loss also    Post-surgical hypothyroidism Inadequately treated, increase dose of synthroid and repeat lab in 6 weeks  Morbid obesity ([place]) Deteriorated. Patient re-educated about  the importance of commitment to a  minimum of 150 minutes of exercise per week.  The importance of healthy food choices with portion control discussed. Encouraged to start a food diary, count calories and to consider  joining a support group. Sample diet sheets offered. Goals set by the patient for the next several months.   Weight /BMI 06/22/2017 06/22/2017 03/05/2017  WEIGHT 205 lb 4 oz 205 lb 196 lb 3.2 oz  HEIGHT 5\' 3"  5\' 3"  5\' 3"   BMI 36.36 kg/m2 36.31 kg/m2 34.76 kg/m2   Needs to work on weight loss and low sodium diet   Knee pain Reports that this is worsening and intends to schedule an appointment with  ortho

## 2017-06-28 NOTE — Assessment & Plan Note (Addendum)
Inadequately treated, increase dose of synthroid and repeat lab in 6 weeks

## 2017-07-16 ENCOUNTER — Ambulatory Visit (INDEPENDENT_AMBULATORY_CARE_PROVIDER_SITE_OTHER): Payer: Medicare Other

## 2017-07-16 DIAGNOSIS — N912 Amenorrhea, unspecified: Secondary | ICD-10-CM

## 2017-07-16 MED ORDER — MEDROXYPROGESTERONE ACETATE 150 MG/ML IM SUSP
150.0000 mg | Freq: Once | INTRAMUSCULAR | Status: AC
  Administered 2017-07-16: 150 mg via INTRAMUSCULAR

## 2017-07-16 NOTE — Progress Notes (Signed)
Patient came in for contraception management

## 2017-07-30 DIAGNOSIS — M797 Fibromyalgia: Secondary | ICD-10-CM | POA: Diagnosis not present

## 2017-07-30 DIAGNOSIS — M130 Polyarthritis, unspecified: Secondary | ICD-10-CM | POA: Diagnosis not present

## 2017-07-30 DIAGNOSIS — Z79899 Other long term (current) drug therapy: Secondary | ICD-10-CM | POA: Diagnosis not present

## 2017-07-30 DIAGNOSIS — M25569 Pain in unspecified knee: Secondary | ICD-10-CM | POA: Diagnosis not present

## 2017-08-05 ENCOUNTER — Ambulatory Visit (INDEPENDENT_AMBULATORY_CARE_PROVIDER_SITE_OTHER): Payer: Medicare Other

## 2017-08-05 ENCOUNTER — Encounter: Payer: Self-pay | Admitting: Orthopedic Surgery

## 2017-08-05 ENCOUNTER — Ambulatory Visit: Payer: Medicare Other | Admitting: Orthopedic Surgery

## 2017-08-05 VITALS — BP 147/112 | HR 89 | Ht 63.0 in | Wt 208.0 lb

## 2017-08-05 DIAGNOSIS — S83001A Unspecified subluxation of right patella, initial encounter: Secondary | ICD-10-CM

## 2017-08-05 DIAGNOSIS — G8929 Other chronic pain: Secondary | ICD-10-CM | POA: Diagnosis not present

## 2017-08-05 DIAGNOSIS — M25561 Pain in right knee: Secondary | ICD-10-CM | POA: Diagnosis not present

## 2017-08-05 DIAGNOSIS — M25562 Pain in left knee: Secondary | ICD-10-CM

## 2017-08-05 DIAGNOSIS — S83002A Unspecified subluxation of left patella, initial encounter: Secondary | ICD-10-CM | POA: Diagnosis not present

## 2017-08-05 NOTE — Progress Notes (Signed)
Progress Note   Patient ID: Martha Montgomery, female   DOB: 1980/10/30, 37 y.o.   MRN: [id number]  Chief Complaint  Patient presents with  . NEW PROBLEM    Bilateral knee pain left worse than right, previous surgery on left    37 year old status post left proximal reefing patellofemoral dysplasia presents with bilateral anterior knee pain.  Patient complains of bilateral anterior knee pain which is severe.  She denies any trauma.  Pain is dull aching nonradiating and moderate in intensity associated with subluxation of the patellae.  Prior treatment as stated no other treatment to date patient has trouble taking NSAIDs she is on Protonix has irritable bowel syndrome has tried multiple NSAIDs without success and with side effects of GI upset  She has  is already on hydrocodone and Lyrica  Symptoms present for years timing pain constant but intermittently more severe depending on activity       Review of Systems  Musculoskeletal: Positive for joint pain and myalgias.  All other systems reviewed and are negative.  Current Meds  Medication Sig  . amLODipine (NORVASC) 2.5 MG tablet TAKE ONE TABLET BY MOUTH ONCE DAILY  . hydrochlorothiazide (MICROZIDE) 12.5 MG capsule TAKE ONE CAPSULE BY MOUTH ONCE DAILY  . HYDROcodone-acetaminophen (NORCO) 7.5-325 MG tablet Take 1 tablet by mouth every 6 (six) hours as needed for moderate pain.  Marland Kitchen levothyroxine (SYNTHROID) 200 MCG tablet Take 1 tablet (200 mcg total) by mouth daily before breakfast.  . LYRICA 75 MG capsule Take 75 mg by mouth daily.   . metoprolol tartrate (LOPRESSOR) 50 MG tablet TAKE ONE TABLET BY MOUTH TWICE DAILY  . pantoprazole (PROTONIX) 40 MG tablet Take 1 tablet (40 mg total) by mouth daily. Take 30 minutes before breakfast  . potassium chloride (K-DUR) 10 MEQ tablet Take 1 tablet (10 mEq total) by mouth daily.  . traMADol (ULTRAM) 50 MG tablet   . Vitamin D, Ergocalciferol, (DRISDOL) 50000 units CAPS capsule Take 1 capsule  (50,000 Units total) by mouth every 7 (seven) days.  . [DISCONTINUED] HYDROcodone-acetaminophen (NORCO/VICODIN) 5-325 MG tablet Take 1 tablet by mouth every 6 (six) hours as needed for moderate pain.    Past Medical History:  Diagnosis Date  . Anxiety   . Anxiety disorder   . Arthritis    "knees, joints, hands, toes" (09/28/2013)  . Depression   . Gastritis nov. 2011   EGD by Dr. Oneida Alar, negative h pylori  . GERD (gastroesophageal reflux disease)   . Gout   . Hypertension x 6 years   . Hypothyroidism   . Migraines    "15/month average" (09/28/2013)  . Spinal stenosis   . Thyroid goiter      Allergies  Allergen Reactions  . Codeine Itching  . Ibuprofen Other (See Comments)    Stomach pain  . Soybean-Containing Drug Products Other (See Comments)    Tongue swells a little and feels "rug burned".    BP (!) 147/112   Pulse 89   Ht 5\' 3"  (1.6 m)   Wt 208 lb (94.3 kg)   BMI 36.85 kg/m    Physical Exam  Constitutional: She is oriented to person, place, and time. She appears well-developed and well-nourished. No distress.  Musculoskeletal:  Despite flexible bilateral pes planus her gait is otherwise normal  Neurological: She is alert and oriented to person, place, and time.  Skin: She is not diaphoretic.  Psychiatric: She has a normal mood and affect. Her behavior is normal. Thought content normal.  Ortho Exam Left knee medial incision proximal to the patella healed nontender.  Full range of motion left knee.  Stability in the anterior posterior medial lateral plane.  Patella is completely dislocatable even at extension or flexion.  Strength is normal.  Neurovascular exam is intact on the left leg  Right knee exhibit similar symptoms with no incision there is no surgery open that has been done.  Full range of motion stability and strength are normal except for the patella which is dislocatable.  Strength is normal neurovascular exam is intact  The tenderness is diffusely  about both knees primarily in the patellofemoral region  Medical decision-making  Imaging: Bilateral plain films show tilted patellae with normal tibiofemoral joints please see dictated report   Encounter Diagnoses  Name Primary?  . Chronic pain of left knee Yes  . Chronic pain of right knee   . Subluxation of left patella, initial encounter   . Subluxation of right patella, initial encounter     Assessment and plan the patient has obvious patellofemoral dysplasia and subluxation of the patellae bilaterally  She has had a proximal procedure in the past  She is a candidate for possible distal patellofemoral realignment.  We need to find out her tibial tubercle to trochlear groove distance with greater than 20 being abnormal and we also need to assess the patellofemoral cartilage to make sure she does not have medial or superior disease which would be a contraindication to patellofemoral realignment  She is 37 years old she is not a candidate for knee replacement but may be a candidate for realignment with patellofemoral isolated replacement.  Patient cannot take anti-inflammatory medications and is already on opioid therapy as well as gabapentin with therapy.  She would benefit from topical medication.  I also ordered orthotics for the flexible pes planus  She should have physical therapy I will see her in 6 weeks     Arther Abbott, MD 08/05/2017 4:03 PM

## 2017-08-06 ENCOUNTER — Telehealth: Payer: Self-pay | Admitting: Radiology

## 2017-08-06 NOTE — Telephone Encounter (Signed)
Patient is sch for MRI scans on Jan 15th at 4 pm

## 2017-08-11 ENCOUNTER — Ambulatory Visit (HOSPITAL_COMMUNITY)
Admission: RE | Admit: 2017-08-11 | Discharge: 2017-08-11 | Disposition: A | Discharge status: Home / Self Care | Payer: Medicare Other | Source: Ambulatory Visit | Attending: Orthopedic Surgery | Admitting: Orthopedic Surgery

## 2017-08-11 DIAGNOSIS — M94261 Chondromalacia, right knee: Secondary | ICD-10-CM | POA: Diagnosis not present

## 2017-08-11 DIAGNOSIS — M25561 Pain in right knee: Secondary | ICD-10-CM | POA: Diagnosis not present

## 2017-08-11 DIAGNOSIS — S83002A Unspecified subluxation of left patella, initial encounter: Secondary | ICD-10-CM | POA: Diagnosis not present

## 2017-08-11 DIAGNOSIS — M179 Osteoarthritis of knee, unspecified: Secondary | ICD-10-CM | POA: Diagnosis not present

## 2017-08-11 DIAGNOSIS — S83001A Unspecified subluxation of right patella, initial encounter: Secondary | ICD-10-CM

## 2017-08-11 DIAGNOSIS — M222X1 Patellofemoral disorders, right knee: Secondary | ICD-10-CM | POA: Insufficient documentation

## 2017-08-11 DIAGNOSIS — X58XXXA Exposure to other specified factors, initial encounter: Secondary | ICD-10-CM | POA: Diagnosis not present

## 2017-08-12 ENCOUNTER — Ambulatory Visit: Payer: Self-pay | Admitting: Family Medicine

## 2017-08-26 ENCOUNTER — Encounter (HOSPITAL_COMMUNITY): Payer: Self-pay

## 2017-08-26 ENCOUNTER — Emergency Department (HOSPITAL_COMMUNITY)
Admission: EM | Admit: 2017-08-26 | Discharge: 2017-08-27 | Disposition: A | Discharge status: Home / Self Care | Payer: Medicare Other | Attending: Emergency Medicine | Admitting: Emergency Medicine

## 2017-08-26 ENCOUNTER — Other Ambulatory Visit: Payer: Self-pay

## 2017-08-26 DIAGNOSIS — I10 Essential (primary) hypertension: Secondary | ICD-10-CM | POA: Insufficient documentation

## 2017-08-26 DIAGNOSIS — Z87891 Personal history of nicotine dependence: Secondary | ICD-10-CM | POA: Insufficient documentation

## 2017-08-26 DIAGNOSIS — E039 Hypothyroidism, unspecified: Secondary | ICD-10-CM | POA: Insufficient documentation

## 2017-08-26 DIAGNOSIS — Z79899 Other long term (current) drug therapy: Secondary | ICD-10-CM | POA: Diagnosis not present

## 2017-08-26 DIAGNOSIS — R42 Dizziness and giddiness: Secondary | ICD-10-CM | POA: Diagnosis not present

## 2017-08-26 DIAGNOSIS — R51 Headache: Secondary | ICD-10-CM | POA: Diagnosis not present

## 2017-08-26 NOTE — ED Triage Notes (Signed)
Pt states her blood pressure has been up tonight, states she also feels dizzy and starting to get a headache.

## 2017-08-27 ENCOUNTER — Emergency Department (HOSPITAL_COMMUNITY): Payer: Medicare Other

## 2017-08-27 DIAGNOSIS — R51 Headache: Secondary | ICD-10-CM | POA: Diagnosis not present

## 2017-08-27 LAB — URINALYSIS, ROUTINE W REFLEX MICROSCOPIC
Bilirubin Urine: NEGATIVE
Glucose, UA: NEGATIVE mg/dL
HGB URINE DIPSTICK: NEGATIVE
Ketones, ur: NEGATIVE mg/dL
NITRITE: NEGATIVE
PROTEIN: NEGATIVE mg/dL
Specific Gravity, Urine: 1.01 (ref 1.005–1.030)
pH: 6 (ref 5.0–8.0)

## 2017-08-27 LAB — CBC WITH DIFFERENTIAL/PLATELET
BASOS ABS: 0.1 10*3/uL (ref 0.0–0.1)
BASOS PCT: 1 %
Eosinophils Absolute: 0.1 10*3/uL (ref 0.0–0.7)
Eosinophils Relative: 1 %
HEMATOCRIT: 38.9 % (ref 36.0–46.0)
Hemoglobin: 12.6 g/dL (ref 12.0–15.0)
Lymphocytes Relative: 40 %
Lymphs Abs: 3.4 10*3/uL (ref 0.7–4.0)
MCH: 30.1 pg (ref 26.0–34.0)
MCHC: 32.4 g/dL (ref 30.0–36.0)
MCV: 93.1 fL (ref 78.0–100.0)
MONO ABS: 0.5 10*3/uL (ref 0.1–1.0)
Monocytes Relative: 6 %
NEUTROS ABS: 4.4 10*3/uL (ref 1.7–7.7)
Neutrophils Relative %: 52 %
PLATELETS: 474 10*3/uL — AB (ref 150–400)
RBC: 4.18 MIL/uL (ref 3.87–5.11)
RDW: 13.2 % (ref 11.5–15.5)
WBC: 8.3 10*3/uL (ref 4.0–10.5)

## 2017-08-27 LAB — BASIC METABOLIC PANEL
ANION GAP: 10 (ref 5–15)
BUN: 9 mg/dL (ref 6–20)
CO2: 21 mmol/L — AB (ref 22–32)
Calcium: 8.3 mg/dL — ABNORMAL LOW (ref 8.9–10.3)
Chloride: 107 mmol/L (ref 101–111)
Creatinine, Ser: 0.76 mg/dL (ref 0.44–1.00)
Glucose, Bld: 106 mg/dL — ABNORMAL HIGH (ref 65–99)
POTASSIUM: 3.6 mmol/L (ref 3.5–5.1)
Sodium: 138 mmol/L (ref 135–145)

## 2017-08-27 LAB — TROPONIN I: Troponin I: <0.03 ng/mL (ref <0.03)

## 2017-08-27 LAB — PREGNANCY, URINE: PREG TEST UR: NEGATIVE

## 2017-08-27 MED ORDER — MECLIZINE HCL 25 MG PO TABS: 25.0000 mg | ORAL_TABLET | Freq: Three times a day (TID) | ORAL | 0 refills | Status: DC | PRN

## 2017-08-27 MED ORDER — MECLIZINE HCL 12.5 MG PO TABS
25.0000 mg | ORAL_TABLET | Freq: Once | ORAL | Status: AC
  Administered 2017-08-27: 25 mg via ORAL
  Filled 2017-08-27: qty 2

## 2017-08-27 MED ORDER — HYDROCODONE-ACETAMINOPHEN 5-325 MG PO TABS
1.0000 | ORAL_TABLET | Freq: Once | ORAL | Status: AC
  Administered 2017-08-27: 1 via ORAL
  Filled 2017-08-27: qty 1

## 2017-08-27 NOTE — ED Provider Notes (Signed)
[facility] EMERGENCY DEPARTMENT Provider Note   CSN: [id number] Arrival date & time: 08/26/17  2327     History   Chief Complaint Chief Complaint  Patient presents with  . Hypertension    HPI Martha Montgomery is a 37 y.o. female.  Patient with multiple complaints.  States she was at home feeling "awful" and checked her blood pressure and found it to be 150/100.  She takes multiple blood pressure medications and states her blood pressures normally well controlled below 120.  This feeling of awfulness was described as dizzy and lightheaded and feeling off off balance with gradual onset headache.  Patient reports having "borderline fever" was found when she came to the ED.  She denies any focal weakness, numbness or tingling.  No bowel or bladder incontinence.  No chest pain or shortness of breath.  She denies any sore throat, cough, nausea or vomiting.  She has been constipated and having blood with wiping which she says is typical for her internal hemorrhoids.  She states compliance with her blood pressure medications.  Denies any visual changes.   The history is provided by the patient.  Hypertension  Associated symptoms include headaches. Pertinent negatives include no chest pain, no abdominal pain and no shortness of breath.    Past Medical History:  Diagnosis Date  . Anxiety   . Anxiety disorder   . Arthritis    "knees, joints, hands, toes" (09/28/2013)  . Depression   . Gastritis nov. 2011   EGD by Dr. Oneida Alar, negative h pylori  . GERD (gastroesophageal reflux disease)   . Gout   . Hypertension x 6 years   . Hypothyroidism   . Migraines    "15/month average" (09/28/2013)  . Spinal stenosis   . Thyroid goiter     Patient Active Problem List   Diagnosis Date Noted  . Bloating 03/05/2017  . Bilateral knee pain 01/05/2017  . Amenorrhea 01/05/2017  . Bilateral hip pain 07/22/2016  . Morbid obesity ([place]) 06/30/2015  . Post-surgical hypothyroidism 06/02/2014  . HTN  (hypertension), benign 06/30/2012  . Knee pain 06/02/2012  . Patellar malalignment syndrome 03/09/2012  . IBS (irritable bowel syndrome) 08/29/2011  . GERD (gastroesophageal reflux disease) 08/29/2011  . BACK PAIN 09/24/2010  . Migraine with aura 04/02/2010  . Constipation 09/12/2009  . Allergic rhinitis 06/16/2009  . Anxiety and depression 10/18/2008    Past Surgical History:  Procedure Laterality Date  . CESAREAN SECTION  2008  . CHONDROPLASTY  04/02/2012   Procedure: CHONDROPLASTY;  Surgeon: Carole Civil, MD;  Location: [facility];  Service: Orthopedics;  Laterality: Left;  of left patella  . COLONOSCOPY  May 2012   Dr. Oneida Alar: normal colon, small internal hemorrhoids  . INCISION AND DRAINAGE ABSCESS  1997; 2005   "under right buttocks; under left arm"  . THYROIDECTOMY N/A 09/28/2013   Procedure: TOTAL THYROIDECTOMY;  Surgeon: Ascencion Dike, MD;  Location: [facility];  Service: ENT;  Laterality: N/A;  . TOTAL THYROIDECTOMY  09/27/2013  . UPPER GI ENDOSCOPY      OB History    No data available       Home Medications    Prior to Admission medications   Medication Sig Start Date End Date Taking? Authorizing Provider  amLODipine (NORVASC) 2.5 MG tablet TAKE ONE TABLET BY MOUTH ONCE DAILY 06/02/17   Fayrene Helper, MD  azelastine (ASTELIN) 0.1 % nasal spray Place 2 sprays into both nostrils 2 (two) times daily. Use in each  nostril as directed Patient not taking: Reported on 08/05/2017 01/05/17   Fayrene Helper, MD  hydrochlorothiazide (MICROZIDE) 12.5 MG capsule TAKE ONE CAPSULE BY MOUTH ONCE DAILY 06/02/17   Fayrene Helper, MD  HYDROcodone-acetaminophen (NORCO) 7.5-325 MG tablet Take 1 tablet by mouth every 6 (six) hours as needed for moderate pain.    [provider]  levothyroxine (SYNTHROID) 200 MCG tablet Take 1 tablet (200 mcg total) by mouth daily before breakfast. 06/22/17   Fayrene Helper, MD  LYRICA 75 MG capsule Take 75 mg by mouth daily.  11/28/16    [provider]  metoprolol tartrate (LOPRESSOR) 50 MG tablet TAKE ONE TABLET BY MOUTH TWICE DAILY 06/02/17   Fayrene Helper, MD  pantoprazole (PROTONIX) 40 MG tablet Take 1 tablet (40 mg total) by mouth daily. Take 30 minutes before breakfast 03/05/17   Annitta Needs, NP  potassium chloride (K-DUR) 10 MEQ tablet Take 1 tablet (10 mEq total) by mouth daily. 07/22/16   Fayrene Helper, MD  traMADol Veatrice Bourbon) 50 MG tablet  02/02/17   [provider]  Vitamin D, Ergocalciferol, (DRISDOL) 50000 units CAPS capsule Take 1 capsule (50,000 Units total) by mouth every 7 (seven) days. 08/08/16   Fayrene Helper, MD    Family History Family History  Problem Relation Age of Onset  . Arthritis Mother   . Migraines Mother   . Hypertension Mother   . Cancer Maternal Grandmother        lung  . Asthma Unknown        family history   . Colon cancer Neg Hx     Social History Social History   Tobacco Use  . Smoking status: Former Smoker    Packs/day: 1.00    Years: 5.00    Pack years: 5.00    Types: Cigarettes    Last attempt to quit: 09/11/2010    Years since quitting: 6.9  . Smokeless tobacco: Never Used  . Tobacco comment: QUIT SMOKING X 3 YEARS AGO  Substance Use Topics  . Alcohol use: No    Comment:  "have a drink a couple times per yr"  . Drug use: No     Allergies   Codeine; Ibuprofen; and Soybean-containing drug products   Review of Systems Review of Systems  Constitutional: Positive for fatigue. Negative for activity change, appetite change and fever.  HENT: Negative for congestion, sinus pain and sore throat.   Eyes: Negative for visual disturbance.  Respiratory: Negative for chest tightness and shortness of breath.   Cardiovascular: Negative for chest pain and leg swelling.  Gastrointestinal: Negative for abdominal pain, nausea and vomiting.  Genitourinary: Negative for dysuria, flank pain, hematuria, pelvic pain, vaginal bleeding and vaginal  discharge.  Musculoskeletal: Positive for arthralgias and myalgias.  Neurological: Positive for dizziness, light-headedness and headaches. Negative for weakness.   all other systems are negative except as noted in the HPI and PMH.     Physical Exam Updated Vital Signs BP (!) 147/103 (BP Location: Right Arm)   Pulse 72   Temp 99.9 F (37.7 C) (Oral)   Resp 15   Ht 5\' 3"  (1.6 m)   Wt 94.8 kg (209 lb)   LMP  (LMP Unknown)   SpO2 100%   BMI 37.02 kg/m   Physical Exam  Constitutional: She is oriented to person, place, and time. She appears well-developed and well-nourished. No distress.  HENT:  Head: Normocephalic and atraumatic.  Mouth/Throat: Oropharynx is clear and moist.  No oropharyngeal exudate.  Eyes: Conjunctivae and EOM are normal. Pupils are equal, round, and reactive to light.  Neck: Normal range of motion. Neck supple.  No meningismus.  Cardiovascular: Normal rate, regular rhythm, normal heart sounds and intact distal pulses.  No murmur heard. Pulmonary/Chest: Effort normal and breath sounds normal. No respiratory distress.  Abdominal: Soft. There is no tenderness. There is no rebound and no guarding.  Musculoskeletal: Normal range of motion. She exhibits no edema or tenderness.  Neurological: She is alert and oriented to person, place, and time. No cranial nerve deficit. She exhibits normal muscle tone. Coordination normal.  No ataxia on finger to nose bilaterally. No pronator drift. 5/5 strength throughout. CN 2-12 intact.Equal grip strength. Sensation intact.  No nystagmus.Romberg negative. Normal gait  Skin: Skin is warm. Capillary refill takes less than 2 seconds.  Psychiatric: She has a normal mood and affect. Her behavior is normal.  Nursing note and vitals reviewed.    ED Treatments / Results  Labs (all labs ordered are listed, but only abnormal results are displayed) Labs Reviewed  URINALYSIS, ROUTINE W REFLEX MICROSCOPIC - Abnormal; Notable for the  following components:      Result Value   Leukocytes, UA TRACE (*)    Bacteria, UA RARE (*)    Squamous Epithelial / LPF 0-5 (*)    All other components within normal limits  CBC WITH DIFFERENTIAL/PLATELET - Abnormal; Notable for the following components:   Platelets 474 (*)    All other components within normal limits  BASIC METABOLIC PANEL - Abnormal; Notable for the following components:   CO2 21 (*)    Glucose, Bld 106 (*)    Calcium 8.3 (*)    All other components within normal limits  PREGNANCY, URINE  TROPONIN I    EKG  EKG Interpretation  Date/Time:  Thursday August 27 2017 00:42:48 EST Ventricular Rate:  71 PR Interval:    QRS Duration: 89 QT Interval:  376 QTC Calculation: 409 R Axis:   54 Text Interpretation:  Sinus rhythm Borderline prolonged PR interval Low voltage, precordial leads No significant change was found Confirmed by Ezequiel Essex [phone]) on 08/27/2017 12:56:52 AM       Radiology Ct Head Wo Contrast  Result Date: 08/27/2017 CLINICAL DATA:  Elevated blood pressure tonight. Dizziness and headache. EXAM: CT HEAD WITHOUT CONTRAST TECHNIQUE: Contiguous axial images were obtained from the base of the skull through the vertex without intravenous contrast. COMPARISON:  None. FINDINGS: Brain: No evidence of acute infarction, hemorrhage, hydrocephalus, extra-axial collection or mass lesion/mass effect. Vascular: No hyperdense vessel or unexpected calcification. Skull: Normal. Negative for fracture or focal lesion. Sinuses/Orbits: No acute finding. Other: None. IMPRESSION: No acute intracranial abnormalities.  Normal examination. Electronically Signed   By: Lucienne Capers M.D.   On: 08/27/2017 02:04    Procedures Procedures (including critical care time)  Medications Ordered in ED Medications - No data to display   Initial Impression / Assessment and Plan / ED Course  I have reviewed the triage vital signs and the nursing notes.  Pertinent labs &  imaging results that were available during my care of the patient were reviewed by me and considered in my medical decision making (see chart for details).   Patient with multiple complaints.  States blood pressure was elevated at home tonight with feeling of dizziness and unsteadiness with gradual onset headache.  Her neurological exam is nonfocal.  EKG is sinus rhythm and unchanged.   Neurological exam is nonfocal.  Orthostatics are negative.  EKG is sinus rhythm and unchanged.  Patient given IV fluids and meclizine.  Low suspicion for central cause of vertigo.  No nystagmus.  No ataxia.  Normal gait. CT head is negative.  Blood pressure has improved without treatment in the ED.  She is tolerating p.o. and ambulatory.  Patient may be developing early viral illness given borderline temperature body aches and nausea.  Discussed PCP follow-up with supportive care.  Monitor blood pressure at home.  Return precautions discussed. Final Clinical Impressions(s) / ED Diagnoses   Final diagnoses:  Dizziness    ED Discharge Orders    None       Ether Goebel, Annie Main, MD 08/27/17 [phone]

## 2017-08-27 NOTE — ED Notes (Signed)
EKG done and given to Dr Rancour 

## 2017-08-27 NOTE — Discharge Instructions (Signed)
Monitor your blood pressure and take the dizziness medication as prescribed.  Follow-up with your doctor this week and keep yourself hydrated.  Return to the ED if you develop new or worsening symptoms.

## 2017-08-27 NOTE — ED Notes (Signed)
Pt has been drinking water and she ambulated to the restroom without assistance.

## 2017-08-31 ENCOUNTER — Ambulatory Visit: Payer: Medicare Other | Admitting: Orthopedic Surgery

## 2017-08-31 ENCOUNTER — Encounter: Payer: Self-pay | Admitting: Orthopedic Surgery

## 2017-08-31 VITALS — BP 151/98 | HR 68 | Ht 63.0 in | Wt 208.0 lb

## 2017-08-31 DIAGNOSIS — Q682 Congenital deformity of knee: Secondary | ICD-10-CM | POA: Diagnosis not present

## 2017-08-31 NOTE — Progress Notes (Signed)
a 

## 2017-08-31 NOTE — Progress Notes (Addendum)
  Chief Complaint  Patient presents with  . Follow-up    Recheck with MRI review of left knee.   C/O SEVERE PAIN EVEN AFTER REST AND PAIN AFTER SITTING WITH STIFFNESS    HERE ARE THE REPORTS : LEFT KNEE IMPRESSION: 1. MR findings consistent with lateral patellar compression syndrome as discussed above. Associated patellar Alta and elongated patellar tendon. 2. Moderate degenerative chondrosis/chondromalacia involving the patellofemoral joint. 3. Evidence of prior medial retinacular surgery. 4. Intact ligamentous structures and no acute bony findings. 5. No meniscal tears.     Electronically Signed   By: Marijo Sanes M.D.   On: 08/12/2017 09:23   CLINICAL DATA:  Right knee pain for 3 months.   EXAM:  MRI OF THE RIGHT KNEE WITHOUT CONTRAST IMPRESSION: 1. MR findings consistent with lateral patellar compression syndrome. Mild lateral orientation of the patella but the TT-TG distance is normal. Patella alta with elongated patellar tendon. 2. Mild to moderate degenerative chondrosis/chondromalacia at the patellofemoral joint. 3. Intact ligamentous structures and no acute bony findings. 4. No meniscal tears.     Electronically Signed   By: Marijo Sanes M.D.   On: 08/12/2017 09:19  Encounter Diagnosis  Name Primary?  . Patella alta Yes    Recommend knee exercises and possibility referral for patella alter for tibial osteotomy of the tubercle and bring in the tendon and patella extensor mechanism down inferiorly  Mild chondral changes on the patellofemoral joint are noted but do not seem to be the source of the pain.  FU AS NEEDED

## 2017-09-01 ENCOUNTER — Telehealth: Payer: Self-pay | Admitting: Radiology

## 2017-09-01 NOTE — Telephone Encounter (Signed)
Will have patient bring in her MRI on disk I called to schedule appt. Feb 7th Thursday Dr Prudy Feeler at 945. [place]  Left message for a call back.

## 2017-09-03 DIAGNOSIS — M25361 Other instability, right knee: Secondary | ICD-10-CM | POA: Diagnosis not present

## 2017-09-03 DIAGNOSIS — M25362 Other instability, left knee: Secondary | ICD-10-CM | POA: Diagnosis not present

## 2017-09-04 ENCOUNTER — Other Ambulatory Visit: Payer: Self-pay

## 2017-09-04 ENCOUNTER — Emergency Department (HOSPITAL_COMMUNITY)
Admission: EM | Admit: 2017-09-04 | Discharge: 2017-09-04 | Disposition: A | Discharge status: Home / Self Care | Payer: Medicare Other | Attending: Emergency Medicine | Admitting: Emergency Medicine

## 2017-09-04 ENCOUNTER — Encounter (HOSPITAL_COMMUNITY): Payer: Self-pay

## 2017-09-04 ENCOUNTER — Emergency Department (HOSPITAL_COMMUNITY): Payer: Medicare Other

## 2017-09-04 DIAGNOSIS — Z79899 Other long term (current) drug therapy: Secondary | ICD-10-CM | POA: Diagnosis not present

## 2017-09-04 DIAGNOSIS — R599 Enlarged lymph nodes, unspecified: Secondary | ICD-10-CM | POA: Diagnosis not present

## 2017-09-04 DIAGNOSIS — R220 Localized swelling, mass and lump, head: Secondary | ICD-10-CM | POA: Diagnosis not present

## 2017-09-04 DIAGNOSIS — I10 Essential (primary) hypertension: Secondary | ICD-10-CM | POA: Diagnosis not present

## 2017-09-04 DIAGNOSIS — Z87891 Personal history of nicotine dependence: Secondary | ICD-10-CM | POA: Insufficient documentation

## 2017-09-04 DIAGNOSIS — E039 Hypothyroidism, unspecified: Secondary | ICD-10-CM | POA: Diagnosis not present

## 2017-09-04 DIAGNOSIS — J029 Acute pharyngitis, unspecified: Secondary | ICD-10-CM | POA: Diagnosis not present

## 2017-09-04 DIAGNOSIS — R1310 Dysphagia, unspecified: Secondary | ICD-10-CM | POA: Diagnosis not present

## 2017-09-04 LAB — RAPID STREP SCREEN (MED CTR MEBANE ONLY): STREPTOCOCCUS, GROUP A SCREEN (DIRECT): NEGATIVE

## 2017-09-04 MED ORDER — IOPAMIDOL (ISOVUE-300) INJECTION 61%
75.0000 mL | Freq: Once | INTRAVENOUS | Status: AC | PRN
  Administered 2017-09-04: 05:00:00 via INTRAVENOUS

## 2017-09-04 NOTE — ED Notes (Signed)
ED Provider at bedside. 

## 2017-09-04 NOTE — ED Provider Notes (Signed)
[facility] EMERGENCY DEPARTMENT Provider Note   CSN: [id number] Arrival date & time: 09/04/17  0046     History   Chief Complaint Chief Complaint  Patient presents with  . Sore Throat    swollen lymph nodes    HPI Martha Montgomery is a 37 y.o. female.  Patient with history of hypothyroidism status post thyroidectomy presenting with anterior neck pain and throat pain for the past 2 days.  Reports "swollen lymph nodes" on the sides of her neck and pain with swallowing.  Denies any fever, chills, nausea or vomiting.  No difficulty breathing or difficulty swallowing.  No chest pain or shortness of breath.  She feels there is a mass in her throat and a foreign body sensation.   The history is provided by the patient and a relative.  Sore Throat  Pertinent negatives include no chest pain, no abdominal pain, no headaches and no shortness of breath.    Past Medical History:  Diagnosis Date  . Anxiety   . Anxiety disorder   . Arthritis    "knees, joints, hands, toes" (09/28/2013)  . Depression   . Gastritis nov. 2011   EGD by Dr. Oneida Alar, negative h pylori  . GERD (gastroesophageal reflux disease)   . Gout   . Hypertension x 6 years   . Hypothyroidism   . Migraines    "15/month average" (09/28/2013)  . Spinal stenosis   . Thyroid goiter     Patient Active Problem List   Diagnosis Date Noted  . Bloating 03/05/2017  . Bilateral knee pain 01/05/2017  . Amenorrhea 01/05/2017  . Bilateral hip pain 07/22/2016  . Morbid obesity ([place]) 06/30/2015  . Post-surgical hypothyroidism 06/02/2014  . HTN (hypertension), benign 06/30/2012  . Knee pain 06/02/2012  . Patellar malalignment syndrome 03/09/2012  . IBS (irritable bowel syndrome) 08/29/2011  . GERD (gastroesophageal reflux disease) 08/29/2011  . BACK PAIN 09/24/2010  . Migraine with aura 04/02/2010  . Constipation 09/12/2009  . Allergic rhinitis 06/16/2009  . Anxiety and depression 10/18/2008    Past Surgical History:    Procedure Laterality Date  . CESAREAN SECTION  2008  . CHONDROPLASTY  04/02/2012   Procedure: CHONDROPLASTY;  Surgeon: Carole Civil, MD;  Location: [facility];  Service: Orthopedics;  Laterality: Left;  of left patella  . COLONOSCOPY  May 2012   Dr. Oneida Alar: normal colon, small internal hemorrhoids  . INCISION AND DRAINAGE ABSCESS  1997; 2005   "under right buttocks; under left arm"  . THYROIDECTOMY N/A 09/28/2013   Procedure: TOTAL THYROIDECTOMY;  Surgeon: Ascencion Dike, MD;  Location: [facility];  Service: ENT;  Laterality: N/A;  . TOTAL THYROIDECTOMY  09/27/2013  . UPPER GI ENDOSCOPY      OB History    No data available       Home Medications    Prior to Admission medications   Medication Sig Start Date End Date Taking? Authorizing Provider  amLODipine (NORVASC) 2.5 MG tablet TAKE ONE TABLET BY MOUTH ONCE DAILY 06/02/17   Fayrene Helper, MD  azelastine (ASTELIN) 0.1 % nasal spray Place 2 sprays into both nostrils 2 (two) times daily. Use in each nostril as directed Patient not taking: Reported on 08/05/2017 01/05/17   Fayrene Helper, MD  hydrochlorothiazide (MICROZIDE) 12.5 MG capsule TAKE ONE CAPSULE BY MOUTH ONCE DAILY 06/02/17   Fayrene Helper, MD  HYDROcodone-acetaminophen (NORCO) 7.5-325 MG tablet Take 1 tablet by mouth every 6 (six) hours as needed for moderate pain.  [provider]  levothyroxine (SYNTHROID) 200 MCG tablet Take 1 tablet (200 mcg total) by mouth daily before breakfast. 06/22/17   Fayrene Helper, MD  LYRICA 75 MG capsule Take 75 mg by mouth daily.  11/28/16   [provider]  meclizine (ANTIVERT) 25 MG tablet Take 1 tablet (25 mg total) by mouth 3 (three) times daily as needed for dizziness. 08/27/17   Jakeline Dave, Annie Main, MD  metoprolol tartrate (LOPRESSOR) 50 MG tablet TAKE ONE TABLET BY MOUTH TWICE DAILY 06/02/17   Fayrene Helper, MD  pantoprazole (PROTONIX) 40 MG tablet Take 1 tablet (40 mg total) by mouth daily. Take 30 minutes  before breakfast 03/05/17   Annitta Needs, NP  potassium chloride (K-DUR) 10 MEQ tablet Take 1 tablet (10 mEq total) by mouth daily. 07/22/16   Fayrene Helper, MD  traMADol Veatrice Bourbon) 50 MG tablet  02/02/17   [provider]  Vitamin D, Ergocalciferol, (DRISDOL) 50000 units CAPS capsule Take 1 capsule (50,000 Units total) by mouth every 7 (seven) days. 08/08/16   Fayrene Helper, MD    Family History Family History  Problem Relation Age of Onset  . Arthritis Mother   . Migraines Mother   . Hypertension Mother   . Cancer Maternal Grandmother        lung  . Asthma Unknown        family history   . Colon cancer Neg Hx     Social History Social History   Tobacco Use  . Smoking status: Former Smoker    Packs/day: 1.00    Years: 5.00    Pack years: 5.00    Types: Cigarettes    Last attempt to quit: 09/11/2010    Years since quitting: 6.9  . Smokeless tobacco: Never Used  . Tobacco comment: QUIT SMOKING X 3 YEARS AGO  Substance Use Topics  . Alcohol use: No    Comment:  "have a drink a couple times per yr"  . Drug use: No     Allergies   Codeine; Ibuprofen; and Soybean-containing drug products   Review of Systems Review of Systems  Constitutional: Negative for appetite change and fever.  HENT: Positive for facial swelling, sore throat and trouble swallowing. Negative for nosebleeds and postnasal drip.   Eyes: Negative for visual disturbance.  Respiratory: Negative for cough, chest tightness and shortness of breath.   Cardiovascular: Negative for chest pain.  Gastrointestinal: Negative for abdominal pain, nausea and vomiting.  Genitourinary: Negative for dysuria and hematuria.  Musculoskeletal: Negative for arthralgias and myalgias.  Skin: Negative for rash.  Neurological: Negative for dizziness, weakness and headaches.   all other systems are negative except as noted in the HPI and PMH.     Physical Exam Updated Vital Signs BP (!) 147/108   Pulse 93    Temp 99.1 F (37.3 C) (Oral)   Resp 20   Ht 5\' 3"  (1.6 m)   Wt 94.3 kg (208 lb)   LMP  (LMP Unknown)   SpO2 98%   BMI 36.85 kg/m   Physical Exam  Constitutional: She is oriented to person, place, and time. She appears well-developed and well-nourished. No distress.  HENT:  Head: Normocephalic and atraumatic.  Mouth/Throat: Oropharynx is clear and moist. No oropharyngeal exudate.  Tonsils symmetric.  Uvula midline.  Floor mouth soft.  Eyes: Conjunctivae and EOM are normal. Pupils are equal, round, and reactive to light.  Neck: Normal range of motion. Neck supple.  No meningismus.  Surgical  scar well-healed.  No palpable foreign body or asymmetric swelling  Cardiovascular: Normal rate, regular rhythm, normal heart sounds and intact distal pulses.  No murmur heard. Pulmonary/Chest: Effort normal and breath sounds normal. No respiratory distress.  Abdominal: Soft. There is no tenderness. There is no rebound and no guarding.  Musculoskeletal: Normal range of motion. She exhibits no edema or tenderness.  Lymphadenopathy:    She has cervical adenopathy.  Neurological: She is alert and oriented to person, place, and time. No cranial nerve deficit. She exhibits normal muscle tone. Coordination normal.  No ataxia on finger to nose bilaterally. No pronator drift. 5/5 strength throughout. CN 2-12 intact.Equal grip strength. Sensation intact.   Skin: Skin is warm.  Psychiatric: She has a normal mood and affect. Her behavior is normal.  Nursing note and vitals reviewed.    ED Treatments / Results  Labs (all labs ordered are listed, but only abnormal results are displayed) Labs Reviewed  RAPID STREP SCREEN (NOT AT [facility])  CULTURE, GROUP A STREP [facility])    EKG  EKG Interpretation None       Radiology Ct Soft Tissue Neck W Contrast  Result Date: 09/04/2017 CLINICAL DATA:  Initial evaluation for acute sore throat with swollen lymph nodes for 2 days. EXAM: CT NECK WITH CONTRAST  TECHNIQUE: Multidetector CT imaging of the neck was performed using the standard protocol following the bolus administration of intravenous contrast. CONTRAST:  <See Chart> ISOVUE-300 IOPAMIDOL (ISOVUE-300) INJECTION 61% COMPARISON:  None. FINDINGS: Pharynx and larynx: Oral cavity within normal limits without mass lesion or loculated fluid collection. Few scattered dental caries noted. No acute inflammatory changes about the teeth. Palatine tonsils symmetric and within normal limits. No tonsillar or peritonsillar abscess. Parapharyngeal fat preserved. Nasopharynx within normal limits. No retropharyngeal collection or significant inflammatory changes. Epiglottis normal. Vallecula largely effaced by the lingual tonsils. Remainder of the hypopharynx and supraglottic larynx within normal limits. True cords symmetric and normal. Subglottic airway clear. Salivary glands: Salivary glands including the parotid and submandibular glands are normal. Thyroid: Thyroid appears to be absent. Lymph nodes: No pathologically enlarged lymph nodes identified within the neck. Vascular: Normal intravascular enhancement seen throughout the neck. Scattered venous collaterals noted within the retropharyngeal space. Limited intracranial: Unremarkable. Visualized orbits: Visualized globes and orbital soft tissues within normal limits. Mastoids and visualized paranasal sinuses: Paranasal sinuses are clear. Mastoid air cells and middle ear cavities are clear. Skeleton: No acute osseus abnormality. No worrisome lytic or blastic osseous lesions. Upper chest: Visualized upper chest demonstrates no acute abnormality. Soft tissue density within the anterior mediastinum suspected to reflect residual thymic tissue. Partially visualized lungs are clear. Other: None. IMPRESSION: 1. Negative CT of the neck. No acute inflammatory changes or other abnormality identified. No significant adenopathy. 2. Scattered dental caries. Electronically Signed   By:  Jeannine Boga M.D.   On: 09/04/2017 05:37    Procedures Procedures (including critical care time)  Medications Ordered in ED Medications - No data to display   Initial Impression / Assessment and Plan / ED Course  I have reviewed the triage vital signs and the nursing notes.  Pertinent labs & imaging results that were available during my care of the patient were reviewed by me and considered in my medical decision making (see chart for details).    Patient with anterior neck pain and throat pain for the past 2 days with some difficulty swallowing.  Tonsils appear normal in rapid strep is negative  Strep test negative.  No evidence  of peritonsillar abscess.  CT scan is negative for any deep space tissue infection.  Patient is tolerating p.o.  Suspect viral syndrome causing her pharyngitis and pain with swallowing.  She is able to eat and drink in the ED.  Will treat supportively, antipyretics, p.o. hydration, PCP follow-up.  Return precautions discussed  Final Clinical Impressions(s) / ED Diagnoses   Final diagnoses:  Sore throat    ED Discharge Orders    None       Kemia Wendel, Annie Main, MD 09/04/17 [phone]

## 2017-09-04 NOTE — ED Triage Notes (Signed)
Sore throat and swollen lymph nodes x 2 days, taking tramadol at home for pain.

## 2017-09-04 NOTE — ED Notes (Signed)
Patient transported to CT 

## 2017-09-04 NOTE — Discharge Instructions (Signed)
Your sore throat is likely viral.  Your strep test is negative.  Use Tylenol as needed for pain and fever.  Keep yourself hydrated.  Follow-up with your doctor.  Return to the ED if you develop new or worsening symptoms.

## 2017-09-06 LAB — CULTURE, GROUP A STREP (THRC)

## 2017-09-07 ENCOUNTER — Other Ambulatory Visit: Payer: Self-pay

## 2017-09-09 ENCOUNTER — Encounter: Payer: Self-pay | Admitting: *Deleted

## 2017-09-09 ENCOUNTER — Ambulatory Visit (INDEPENDENT_AMBULATORY_CARE_PROVIDER_SITE_OTHER): Payer: Medicare Other | Admitting: Gastroenterology

## 2017-09-09 ENCOUNTER — Other Ambulatory Visit: Payer: Self-pay | Admitting: *Deleted

## 2017-09-09 ENCOUNTER — Telehealth: Payer: Self-pay | Admitting: *Deleted

## 2017-09-09 ENCOUNTER — Telehealth: Payer: Self-pay

## 2017-09-09 ENCOUNTER — Encounter: Payer: Self-pay | Admitting: Gastroenterology

## 2017-09-09 VITALS — BP 122/87 | HR 88 | Temp 99.0°F | Ht 63.0 in | Wt 212.0 lb

## 2017-09-09 DIAGNOSIS — K625 Hemorrhage of anus and rectum: Secondary | ICD-10-CM | POA: Diagnosis not present

## 2017-09-09 DIAGNOSIS — R1319 Other dysphagia: Secondary | ICD-10-CM

## 2017-09-09 DIAGNOSIS — K219 Gastro-esophageal reflux disease without esophagitis: Secondary | ICD-10-CM | POA: Diagnosis not present

## 2017-09-09 DIAGNOSIS — K5900 Constipation, unspecified: Secondary | ICD-10-CM

## 2017-09-09 DIAGNOSIS — R140 Abdominal distension (gaseous): Secondary | ICD-10-CM | POA: Diagnosis not present

## 2017-09-09 DIAGNOSIS — R1310 Dysphagia, unspecified: Secondary | ICD-10-CM

## 2017-09-09 MED ORDER — PEG 3350-KCL-NA BICARB-NACL 420 G PO SOLR: 4000.0000 mL | Freq: Once | ORAL | 0 refills | Status: AC

## 2017-09-09 MED ORDER — LUBIPROSTONE 24 MCG PO CAPS: 24.0000 ug | ORAL_CAPSULE | Freq: Two times a day (BID) | ORAL | 5 refills | Status: DC

## 2017-09-09 NOTE — Assessment & Plan Note (Signed)
Chronic constipation with superimposed opioid use. Likely multifactorial. Did not respond to Linzess 165mcg daily and not interested in higher dose due to price. Try Amitiza 35mcg bid with food. Samples and RX provided. Patient aware that she needs to let us know if BM frequency is not improved, continues to have hard stool.

## 2017-09-09 NOTE — Patient Instructions (Signed)
1. Start Amitiza 22mcg twice daily with food for constipation. If your bowels are not moving regularly, please let me know. We need to correct this problem prior to your colonoscopy to make sure your colon gets cleaned out properly for the procedure.  2. Colonoscopy and upper endoscopy as scheduled. See separate instructions.

## 2017-09-09 NOTE — Telephone Encounter (Signed)
Pt walked in office to pick medications up.

## 2017-09-09 NOTE — Progress Notes (Addendum)
REVIEWED.pt presenting with similar symptoms as 2012. IF TCS UNREMARKABLE WOULD AVOID ADDITIONAL TCS PRIOR TO AGE 37 FOR RECTAL BLEEDING. PT HAS GAINED 80 LBS SINCE 2014 AND LIKELY HAS UNCONTROLLED GERD.  Primary Care Physician: Fayrene Helper, MD  Primary Gastroenterologist:  Barney Drain, MD   Chief Complaint  Patient presents with  . Bloated    "extreme"  . Gastroesophageal Reflux  . Rectal Bleeding  . Constipation    HPI: Martha Montgomery is a 37 y.o. female here follow-up of abdominal pain, bloating, constipation, GERD, rectal bleeding. Last seen in August. No showed for her October appointment. She has a history of IBS with constipation. Last colonoscopy 2012 with internal hemorrhoids. Her last office visit she was started on Linzess 145 g daily.  Patient reports she took Lyon for about 2 weeks. No significant improvement in her stools or bloating. BM every 4 days or so. Could not afford medication. At this point having a BM 1-2 times weekly. Has to strain. Last one on Monday. Chronic constipation for years, prior to narcotics. Complains of rectal bleeding with almost every stool. No melena.   Complains of postprandial epigastric discomfort, bloating and distention just in the upper abd. Causes regurgitation of food and liquid. Heartburn controlled. Complains of solid food dysphagia, happens intermittently but quite frequently. No nausea.   Drinks mostly water, about 64 ounces daily. Cannot drink more because she gets full. Avoids dairy.   She has gained 80 pounds since 2014. 30 pounds of that was in the last six months.    Current Outpatient Medications  Medication Sig Dispense Refill  . amLODipine (NORVASC) 2.5 MG tablet TAKE ONE TABLET BY MOUTH ONCE DAILY 90 tablet 1  . hydrochlorothiazide (MICROZIDE) 12.5 MG capsule TAKE ONE CAPSULE BY MOUTH ONCE DAILY 30 capsule 3  . HYDROcodone-acetaminophen (NORCO) 7.5-325 MG tablet Take 1 tablet by mouth every 6 (six) hours  as needed for moderate pain.    Marland Kitchen levothyroxine (SYNTHROID) 200 MCG tablet Take 1 tablet (200 mcg total) by mouth daily before breakfast. (Patient taking differently: Take 200 mcg by mouth daily. ) 30 tablet 4  . meclizine (ANTIVERT) 25 MG tablet Take 1 tablet (25 mg total) by mouth 3 (three) times daily as needed for dizziness. 30 tablet 0  . metoprolol tartrate (LOPRESSOR) 50 MG tablet TAKE ONE TABLET BY MOUTH TWICE DAILY 180 tablet 1  . pantoprazole (PROTONIX) 40 MG tablet Take 1 tablet (40 mg total) by mouth daily. Take 30 minutes before breakfast 30 tablet 3  . potassium chloride (K-DUR) 10 MEQ tablet Take 1 tablet (10 mEq total) by mouth daily. 30 tablet 3  . pregabalin (LYRICA) 200 MG capsule Take 200 mg by mouth 3 (three) times daily.    . traMADol (ULTRAM) 50 MG tablet Take 50 mg by mouth as needed.     . Vitamin D, Ergocalciferol, (DRISDOL) 50000 units CAPS capsule Take 1 capsule (50,000 Units total) by mouth every 7 (seven) days. 4 capsule 5   No current facility-administered medications for this visit.     Allergies as of 09/09/2017 - Review Complete 09/09/2017  Allergen Reaction Noted  . Codeine Itching   . Ibuprofen Other (See Comments) 09/02/2016  . Soybean-containing drug products Other (See Comments) 12/10/2010   Past Medical History:  Diagnosis Date  . Anxiety   . Anxiety disorder   . Arthritis    "knees, joints, hands, toes" (09/28/2013)  . Depression   . Gastritis nov. 2011  EGD by Dr. Oneida Alar, negative h pylori  . GERD (gastroesophageal reflux disease)   . Gout   . Hypertension x 6 years   . Hypothyroidism   . Migraines    "15/month average" (09/28/2013)  . Spinal stenosis   . Thyroid goiter    Past Surgical History:  Procedure Laterality Date  . CESAREAN SECTION  2008  . CHONDROPLASTY  04/02/2012   Procedure: CHONDROPLASTY;  Surgeon: Carole Civil, MD;  Location: [facility];  Service: Orthopedics;  Laterality: Left;  of left patella  . COLONOSCOPY  May 2012     Dr. Oneida Alar: normal colon, small internal hemorrhoids  . INCISION AND DRAINAGE ABSCESS  1997; 2005   "under right buttocks; under left arm"  . THYROIDECTOMY N/A 09/28/2013   Procedure: TOTAL THYROIDECTOMY;  Surgeon: Ascencion Dike, MD;  Location: [facility];  Service: ENT;  Laterality: N/A;  . TOTAL THYROIDECTOMY  09/27/2013  . UPPER GI ENDOSCOPY  2011   EGD: gastritis, no h.pylori   Family History  Problem Relation Age of Onset  . Arthritis Mother   . Migraines Mother   . Hypertension Mother   . Cancer Maternal Grandmother        lung  . Asthma Unknown        family history   . Colon cancer Neg Hx    Social History   Tobacco Use  . Smoking status: Former Smoker    Packs/day: 1.00    Years: 5.00    Pack years: 5.00    Types: Cigarettes    Last attempt to quit: 09/11/2010    Years since quitting: 7.0  . Smokeless tobacco: Never Used  . Tobacco comment: QUIT SMOKING X 3 YEARS AGO  Substance Use Topics  . Alcohol use: No  . Drug use: No     ROS:  General: Negative for anorexia, weight loss, fever, chills, fatigue, weakness. ENT: Negative for hoarseness, difficulty swallowing , nasal congestion. CV: Negative for chest pain, angina, palpitations, dyspnea on exertion, peripheral edema.  Respiratory: Negative for dyspnea at rest, dyspnea on exertion, cough, sputum, wheezing.  GI: See history of present illness. GU:  Negative for dysuria, hematuria, urinary incontinence, urinary frequency, nocturnal urination.  Endo: Negative for unusual weight change.    Physical Examination:   BP 122/87   Pulse 88   Temp 99 F (37.2 C) (Oral)   Ht 5\' 3"  (1.6 m)   Wt 212 lb (96.2 kg)   BMI 37.55 kg/m   General: Well-nourished, well-developed in no acute distress.  Eyes: No icterus. Mouth: Oropharyngeal mucosa moist and pink , no lesions erythema or exudate. Lungs: Clear to auscultation bilaterally.  Heart: Regular rate and rhythm, no murmurs rubs or gallops.  Abdomen: Bowel sounds are  normal, distended in upper abd, moderate epig tenderness,no hepatosplenomegaly or masses, no abdominal bruits or hernia , no rebound or guarding.   Extremities: No lower extremity edema. No clubbing or deformities. Neuro: Alert and oriented x 4   Skin: Warm and dry, no jaundice.   Psych: Alert and cooperative, normal mood and affect.  Labs:  Lab Results  Component Value Date   WBC 8.3 08/27/2017   HGB 12.6 08/27/2017   HCT 38.9 08/27/2017   MCV 93.1 08/27/2017   PLT 474 (H) 08/27/2017   Lab Results  Component Value Date   CREATININE 0.76 08/27/2017   BUN 9 08/27/2017   NA 138 08/27/2017   K 3.6 08/27/2017   CL 107 08/27/2017   CO2  21 (L) 08/27/2017   Lab Results  Component Value Date   ALT 12 06/10/2017   AST 13 06/10/2017   ALKPHOS 51 08/07/2016   BILITOT 0.3 06/10/2017    Imaging Studies: Ct Head Wo Contrast  Result Date: 08/27/2017 CLINICAL DATA:  Elevated blood pressure tonight. Dizziness and headache. EXAM: CT HEAD WITHOUT CONTRAST TECHNIQUE: Contiguous axial images were obtained from the base of the skull through the vertex without intravenous contrast. COMPARISON:  None. FINDINGS: Brain: No evidence of acute infarction, hemorrhage, hydrocephalus, extra-axial collection or mass lesion/mass effect. Vascular: No hyperdense vessel or unexpected calcification. Skull: Normal. Negative for fracture or focal lesion. Sinuses/Orbits: No acute finding. Other: None. IMPRESSION: No acute intracranial abnormalities.  Normal examination. Electronically Signed   By: Lucienne Capers M.D.   On: 08/27/2017 02:04   Ct Soft Tissue Neck W Contrast  Result Date: 09/04/2017 CLINICAL DATA:  Initial evaluation for acute sore throat with swollen lymph nodes for 2 days. EXAM: CT NECK WITH CONTRAST TECHNIQUE: Multidetector CT imaging of the neck was performed using the standard protocol following the bolus administration of intravenous contrast. CONTRAST:  <See Chart> ISOVUE-300 IOPAMIDOL  (ISOVUE-300) INJECTION 61% COMPARISON:  None. FINDINGS: Pharynx and larynx: Oral cavity within normal limits without mass lesion or loculated fluid collection. Few scattered dental caries noted. No acute inflammatory changes about the teeth. Palatine tonsils symmetric and within normal limits. No tonsillar or peritonsillar abscess. Parapharyngeal fat preserved. Nasopharynx within normal limits. No retropharyngeal collection or significant inflammatory changes. Epiglottis normal. Vallecula largely effaced by the lingual tonsils. Remainder of the hypopharynx and supraglottic larynx within normal limits. True cords symmetric and normal. Subglottic airway clear. Salivary glands: Salivary glands including the parotid and submandibular glands are normal. Thyroid: Thyroid appears to be absent. Lymph nodes: No pathologically enlarged lymph nodes identified within the neck. Vascular: Normal intravascular enhancement seen throughout the neck. Scattered venous collaterals noted within the retropharyngeal space. Limited intracranial: Unremarkable. Visualized orbits: Visualized globes and orbital soft tissues within normal limits. Mastoids and visualized paranasal sinuses: Paranasal sinuses are clear. Mastoid air cells and middle ear cavities are clear. Skeleton: No acute osseus abnormality. No worrisome lytic or blastic osseous lesions. Upper chest: Visualized upper chest demonstrates no acute abnormality. Soft tissue density within the anterior mediastinum suspected to reflect residual thymic tissue. Partially visualized lungs are clear. Other: None. IMPRESSION: 1. Negative CT of the neck. No acute inflammatory changes or other abnormality identified. No significant adenopathy. 2. Scattered dental caries. Electronically Signed   By: Jeannine Boga M.D.   On: 09/04/2017 05:37   Mr Knee Right Wo Contrast  Result Date: 08/12/2017 CLINICAL DATA:  Right knee pain for 3 months. EXAM: MRI OF THE RIGHT KNEE WITHOUT CONTRAST  TECHNIQUE: Multiplanar, multisequence MR imaging of the knee was performed. No intravenous contrast was administered. COMPARISON:  Radiographs 08/05/2017 FINDINGS: MENISCI Medial meniscus:  Intact Lateral meniscus:  Intact LIGAMENTS Cruciates:  Intact Collaterals:  Intact CARTILAGE Patellofemoral: Mild/moderate degenerative chondrosis for age, mainly along the medial facet Medial:  Minimal/mild degenerative chondrosis. Lateral:  Minimal/mild degenerative chondrosis. Joint:  No joint effusion. Popliteal Fossa: No popliteal mass or Baker's cyst. Some fluid tracking back along the popliteus tendon. Extensor Mechanism: The patella retinacular structures are intact and the quadriceps and patellar tendons are intact. Moderate patella alta with elongated patellar tendon. There is also findings for lateral patellar compression syndrome with moderate edema like signal abnormality and the upper lateral aspect of Hoffa's fat. The TT-TG distance is 15 mm. Bones:  No acute bony findings. Other: Normal knee musculature. IMPRESSION: 1. MR findings consistent with lateral patellar compression syndrome. Mild lateral orientation of the patella but the TT-TG distance is normal. Patella alta with elongated patellar tendon. 2. Mild to moderate degenerative chondrosis/chondromalacia at the patellofemoral joint. 3. Intact ligamentous structures and no acute bony findings. 4. No meniscal tears. Electronically Signed   By: Marijo Sanes M.D.   On: 08/12/2017 09:19   Mr Knee Left  Wo Contrast  Result Date: 08/12/2017 CLINICAL DATA:  Knee pain for 3 months. History of prior knee surgery. EXAM: MRI OF THE LEFT KNEE WITHOUT CONTRAST TECHNIQUE: Multiplanar, multisequence MR imaging of the knee was performed. No intravenous contrast was administered. COMPARISON:  Radiographs 08/05/2017 FINDINGS: MENISCI Medial meniscus:  Intact Lateral meniscus:  Intact LIGAMENTS Cruciates:  Intact Collaterals:  Intact CARTILAGE Patellofemoral:  Moderate  degenerative chondrosis. Medial:  Mild degenerative chondrosis. Lateral:  Mild degenerative chondrosis. Joint:  Small amount of joint fluid but no overt joint effusion. Popliteal Fossa:  No popliteal mass or Baker's cyst. Extensor Mechanism: Evidence of prior surgery involving the medial retinaculum and medial patellofemoral ligament. There is mild lateral orientation of the patella and findings for lateral patellar compression syndrome with moderate edema in the upper lateral aspect of Hoffa's fat between the lateral femoral condyle and the lateral patella. Patella [place] Cloud is also noted with an elongated patellar tendon. Bones:  No acute bony findings. Other: Normal knee musculature. IMPRESSION: 1. MR findings consistent with lateral patellar compression syndrome as discussed above. Associated patellar Alta and elongated patellar tendon. 2. Moderate degenerative chondrosis/chondromalacia involving the patellofemoral joint. 3. Evidence of prior medial retinacular surgery. 4. Intact ligamentous structures and no acute bony findings. 5. No meniscal tears. Electronically Signed   By: Marijo Sanes M.D.   On: 08/12/2017 09:23

## 2017-09-09 NOTE — Telephone Encounter (Signed)
Pre-op scheduled for 10/13/17 at 9:00am. Letter mailed. LMOVM

## 2017-09-09 NOTE — Assessment & Plan Note (Signed)
Frequent brbpr likely due to benign anorectal source in setting of constipation however, last colonoscopy over 7 years ago. Offered colonoscopy with propofol.  I have discussed the risks, alternatives, benefits with regards to but not limited to the risk of reaction to medication, bleeding, infection, perforation and the patient is agreeable to proceed. Written consent to be obtained.

## 2017-09-09 NOTE — Assessment & Plan Note (Signed)
Typical heartburn well controlled. Complains of frequent regurgitation, early satiety, abd bloating, esophageal dysphagia. Suspect element of gastroparesis due to medications playing a role. Less likely due to gastric outlet obstruction, PUD. Offered EGD/ED in near future with propofol.  I have discussed the risks, alternatives, benefits with regards to but not limited to the risk of reaction to medication, bleeding, infection, perforation and the patient is agreeable to proceed. Written consent to be obtained.

## 2017-09-09 NOTE — Telephone Encounter (Signed)
Pt was seen in office today. After scheduling her procedure, pt left all of her medications that were brought to her appt here. Tried calling pt several times. Will keep trying to contact pt.

## 2017-09-10 NOTE — Progress Notes (Signed)
CC'D TO PCP °

## 2017-09-14 ENCOUNTER — Ambulatory Visit (HOSPITAL_COMMUNITY): Payer: Medicare Other

## 2017-09-14 ENCOUNTER — Telehealth (HOSPITAL_COMMUNITY): Payer: Self-pay | Admitting: Family Medicine

## 2017-09-14 NOTE — Telephone Encounter (Signed)
09/14/17  pt left a message to cx said she was coming down with something... does want to RS for next week

## 2017-09-16 ENCOUNTER — Ambulatory Visit: Payer: Self-pay | Admitting: Orthopedic Surgery

## 2017-09-23 ENCOUNTER — Ambulatory Visit (HOSPITAL_COMMUNITY): Payer: Medicare Other

## 2017-09-24 ENCOUNTER — Other Ambulatory Visit: Payer: Self-pay

## 2017-09-24 ENCOUNTER — Ambulatory Visit (HOSPITAL_COMMUNITY): Payer: Medicare Other | Attending: Orthopedic Surgery | Admitting: Physical Therapy

## 2017-09-24 ENCOUNTER — Encounter (HOSPITAL_COMMUNITY): Payer: Self-pay | Admitting: Physical Therapy

## 2017-09-24 DIAGNOSIS — M25562 Pain in left knee: Secondary | ICD-10-CM | POA: Insufficient documentation

## 2017-09-24 DIAGNOSIS — M25561 Pain in right knee: Secondary | ICD-10-CM | POA: Insufficient documentation

## 2017-09-24 DIAGNOSIS — R262 Difficulty in walking, not elsewhere classified: Secondary | ICD-10-CM | POA: Diagnosis not present

## 2017-09-24 DIAGNOSIS — M6281 Muscle weakness (generalized): Secondary | ICD-10-CM | POA: Diagnosis not present

## 2017-09-24 DIAGNOSIS — G8929 Other chronic pain: Secondary | ICD-10-CM | POA: Insufficient documentation

## 2017-09-24 NOTE — Patient Instructions (Addendum)
Heel Raise: Bilateral (Standing)   Holding onto the kitchen counter Rise on balls of feet. Repeat 10____ times per set. Do __1__ sets per session. Do ___2_ sessions per day.  http://orth.exer.us/38   Copyright  VHI. All rights reserved.  Balance: Unilateral    Attempt to balance on left leg, eyes open. Hold __15-30__ seconds. Repeat _5___ times per set. Do _1___ sets per session. Do __2__ sessions per day. Perform exercise on right http://orth.exer.us/28   Copyright  VHI. All rights reserved.  Straight Leg Raise: With External Leg Rotation    Lie on back with right leg straight, opposite leg bent. Rotate straight leg out and lift 15___ inches. Repeat _5-15___ times per set. Do __1__ sets per session. Do _2___ sessions per day.  http://orth.exer.us/728   Copyright  VHI. All rights reserved.  Strengthening: Hip Abduction (Side-Lying)    Tighten muscles on front of left thigh, then lift leg 18____ inches from surface, keeping knee locked.  Repeat _10___ times per set. Do __1__ sets per session. Do ___2_ sessions per day.  http://orth.exer.us/622   Copyright  VHI. All rights reserved.  Self-Mobilization: Knee Flexion (Prone)    Bring left heel toward buttocks as close as possible. Hold _3___ seconds. Relax. Repeat _10___ times per set. Do __1__ sets per session. Do ___2_ sessions per day. Repeat to right  http://orth.exer.us/596   Copyright  VHI. All rights reserved.  Hip Flexor: Leg Hang    Lie on surface with thigh halfway off. Bring both knees to chest to flatten back. As right leg is lowered, grasp other knee. Avoid rotating knee outward. Bend lowered knee until stretch is felt. Hold __30_ seconds. Relax. Repeat _3__ times. Do _2__ times a day. Repeat with other leg.  Copyright  VHI. All rights reserved.  Straight Leg Raise (Prone)    Abdomen and head supported, keep left knee locked and raise leg at hip. Avoid arching low back. Repeat __5-15__  times per set. Do __1__ sets per session. Do __2__ sessions per day.  http://orth.exer.us/1112   Copyright  VHI. All rights reserved.

## 2017-09-24 NOTE — Therapy (Signed)
Martha Montgomery, [place], [address] Phone: [phone]   Fax:  [phone]  Physical Therapy Evaluation  Patient Details  Name: Martha Montgomery MRN: [id number] Date of Birth: 13-Dec-1980 Referring Provider: Dr. Aline Brochure   Encounter Date: 09/24/2017  PT End of Session - 09/24/17 1610    Visit Number  1    Number of Visits  8    Date for PT Re-Evaluation  10/24/17    Authorization Type  [employer] medicare    Authorization - Visit Number  1    Authorization - Number of Visits  8    PT Start Time  1520    PT Stop Time  1606    PT Time Calculation (min)  46 min    Activity Tolerance  Patient tolerated treatment well    Behavior During Therapy  [facility] for tasks assessed/performed       Past Medical History:  Diagnosis Date  . Anxiety   . Anxiety disorder   . Arthritis    "knees, joints, hands, toes" (09/28/2013)  . Depression   . Gastritis nov. 2011   EGD by Dr. Oneida Alar, negative h pylori  . GERD (gastroesophageal reflux disease)   . Gout   . Hypertension x 6 years   . Hypothyroidism   . Migraines    "15/month average" (09/28/2013)  . Spinal stenosis   . Thyroid goiter     Past Surgical History:  Procedure Laterality Date  . CESAREAN SECTION  2008  . CHONDROPLASTY  04/02/2012   Procedure: CHONDROPLASTY;  Surgeon: Carole Civil, MD;  Location: [facility];  Service: Orthopedics;  Laterality: Left;  of left patella  . COLONOSCOPY  May 2012   Dr. Oneida Alar: normal colon, small internal hemorrhoids  . INCISION AND DRAINAGE ABSCESS  1997; 2005   "under right buttocks; under left arm"  . THYROIDECTOMY N/A 09/28/2013   Procedure: TOTAL THYROIDECTOMY;  Surgeon: Ascencion Dike, MD;  Location: [facility];  Service: ENT;  Laterality: N/A;  . TOTAL THYROIDECTOMY  09/27/2013  . UPPER GI ENDOSCOPY  2011   EGD: gastritis, no h.pylori    There were no vitals filed for this visit.   Subjective Assessment - 09/24/17 1524    Subjective  Martha Montgomery  has had pain in both of her knees for years but it has gotten progressively worse therefore she went back to the MD after she noticed that her knees were swelling quite a bit.  She does not have any compression stockings.   She has not really done any exercises do to being fearful.      Pertinent History  Back pain, HTN,     How long can you sit comfortably?  able to sit for 15 minutes     How long can you stand comfortably?  able to stand for 10 mintues     How long can you walk comfortably?  Able to walk for 10 minutes     Patient Stated Goals  less pain, more mobility     Currently in Pain?  Yes    Pain Score  7     Pain Location  Knee    Pain Orientation  Right;Left    Pain Descriptors / Indicators  Dull    Pain Type  Chronic pain    Pain Onset  More than a month ago    Pain Frequency  Constant    Aggravating Factors   activity  Pain Relieving Factors  hot packs     Effect of Pain on Daily Activities  limits          Martha Montgomery PT Assessment - 09/24/17 0001      Assessment   Medical Diagnosis  B knee pain    Referring Provider  Dr. Aline Brochure    Onset Date/Surgical Date  08/31/17    Next MD Visit  nothing scheduled     Prior Therapy  twice before last time 2014.       Precautions   Precautions  None      Restrictions   Weight Bearing Restrictions  No      Balance Screen   Has the patient fallen in the past 6 months  Yes    How many times?  1 x     Has the patient had a decrease in activity level because of a fear of falling?   Yes    Is the patient reluctant to leave their home because of a fear of falling?   Yes      Prior Function   Level of Independence  Independent    Vocation  On disability    Leisure  walking, easier to come sit to stand.       Cognition   Overall Cognitive Status  Within Functional Limits for tasks assessed      Observation/Other Assessments   Observations  ambulates flat footed B     Focus on Therapeutic Outcomes (FOTO)   28       Functional Tests   Functional tests  Single leg stance;Sit to Stand      Single Leg Stance   Comments  LT : 5;  RT 10      Sit to Stand   Comments  5 x 21.43       AROM   Right Knee Extension  0    Right Knee Flexion  120    Left Knee Extension  0    Left Knee Flexion  120      Strength   Right Hip Flexion  4+/5    Right Hip Extension  3/5    Right Hip ABduction  4/5    Left Hip Flexion  4+/5    Left Hip Extension  3/5    Left Hip ABduction  4/5    Right Knee Flexion  4/5    Right Knee Extension  4+/5    Left Knee Flexion  4/5    Left Knee Extension  4+/5      Special Tests   Other special tests  + Thomas test B ( thigh 30 degree off table )              Objective measurements completed on examination: See above findings.      [facility] Adult PT Treatment/Exercise - 09/24/17 0001      Exercises   Exercises  Knee/Hip      Knee/Hip Exercises: Stretches   Hip Flexor Stretch  Both;2 reps;30 seconds      Knee/Hip Exercises: Standing   Heel Raises  10 reps    SLS  3      Knee/Hip Exercises: Supine   Straight Leg Raise with External Rotation  Both;5 reps      Knee/Hip Exercises: Sidelying   Hip ABduction  5 reps;Both             PT Education - 09/24/17 1609    Education provided  Yes  Education Details  HEP    Person(s) Educated  Patient    Methods  Explanation;Handout    Comprehension  Verbalized understanding       PT Short Term Goals - 09/24/17 1618      PT SHORT TERM GOAL #1   Title  PT pain to be no greater than a 5/10 to allow pt to walk for 15 minutes without increased pain .    Time  2    Period  Weeks    Status  New    Target Date  10/08/17      PT SHORT TERM GOAL #2   Title  Pt to be using heel to gait mechanics to improve knee pain.     Time  2    Period  Weeks    Status  New      PT SHORT TERM GOAL #3   Title  PT to be comfortable sitting for an hour to travel in the car    Time  2    Period  Weeks    Status  New         PT Long Term Goals - 09/24/17 1619      PT LONG TERM GOAL #1   Title  Pt pain to be no greater than a 3/10 to allow pt to walk for 30 minutes to return to walking for exercise.     Time  4    Period  Weeks    Status  New    Target Date  10/22/17      PT LONG TERM GOAL #2   Title  PT LE strength B to be improved one grade to allow pt to come sit to stand with no difficulty     Time  4    Period  Weeks    Status  New      PT LONG TERM GOAL #3   Title  PT to have improved flexibility in her hips and knees to be able to don and doff her shoes and socks without difficulty.    Time  4    Period  Weeks    Status  New             Plan - 09/24/17 1612    Clinical Impression Statement  Ms. Glaab states that she has had knee pain bilaterally for years.   She has recently had incresed knee pain and swelling therefore she sought medical advice who referred her to physical therapy.  Examination demonstates global weakness of B LE, decreased balance, decreased activity tolerance, increased pain and tight quadricep and hip flexors.  Ms. Mini will benefit from skilled physical therapy to address these issues and maximize her functional ability.      History and Personal Factors relevant to plan of care:  back pain, depression, anxiety, HTN.    Clinical Decision Making  Moderate    Rehab Potential  Good    PT Frequency  2x / week    PT Duration  4 weeks    PT Treatment/Interventions  Gait training;Stair training;Functional mobility training;Therapeutic activities;Therapeutic exercise;Balance training;Neuromuscular re-education;Patient/family education    PT Next Visit Plan  Educate on inferior glide of patella with 30" holds, begin quadricep stretch, functional squats and step ups     PT Home Exercise Plan  hip flexion stretch, heel raises, SLS, SLR with ER, and hip abduction.     Consulted and Agree with Plan of Care  Patient  Patient will benefit from skilled therapeutic  intervention in order to improve the following deficits and impairments:  Abnormal gait, Decreased activity tolerance, Decreased balance, Decreased strength, Difficulty walking, Impaired flexibility, Pain, Obesity  Visit Diagnosis: Chronic pain of right knee - Plan: PT plan of care cert/re-cert  Chronic pain of left knee - Plan: PT plan of care cert/re-cert  Muscle weakness (generalized) - Plan: PT plan of care cert/re-cert  Difficulty in walking, not elsewhere classified - Plan: PT plan of care cert/re-cert     Problem List Patient Active Problem List   Diagnosis Date Noted  . Esophageal dysphagia 09/09/2017  . Bloating 03/05/2017  . Bilateral knee pain 01/05/2017  . Amenorrhea 01/05/2017  . Bilateral hip pain 07/22/2016  . Morbid obesity ([place]) 06/30/2015  . Post-surgical hypothyroidism 06/02/2014  . HTN (hypertension), benign 06/30/2012  . Knee pain 06/02/2012  . Patellar malalignment syndrome 03/09/2012  . IBS (irritable bowel syndrome) 08/29/2011  . GERD (gastroesophageal reflux disease) 08/29/2011  . Rectal bleeding 12/10/2010  . BACK PAIN 09/24/2010  . Migraine with aura 04/02/2010  . Constipation 09/12/2009  . Allergic rhinitis 06/16/2009  . Anxiety and depression 10/18/2008    Rayetta Humphrey, PT CLT [phone] 09/24/2017, 4:25 PM  [facility] [address] [place], [place], [address] Phone: [phone]   Fax:  [phone]  Name: MAGDA MUISE MRN: [id number] Date of Birth: 11-01-1980

## 2017-09-28 DIAGNOSIS — Z79891 Long term (current) use of opiate analgesic: Secondary | ICD-10-CM | POA: Diagnosis not present

## 2017-09-28 DIAGNOSIS — M545 Low back pain: Secondary | ICD-10-CM | POA: Diagnosis not present

## 2017-09-28 DIAGNOSIS — M130 Polyarthritis, unspecified: Secondary | ICD-10-CM | POA: Diagnosis not present

## 2017-09-28 DIAGNOSIS — M25569 Pain in unspecified knee: Secondary | ICD-10-CM | POA: Diagnosis not present

## 2017-09-30 ENCOUNTER — Encounter (HOSPITAL_COMMUNITY): Payer: Self-pay

## 2017-09-30 ENCOUNTER — Ambulatory Visit (HOSPITAL_COMMUNITY): Payer: Medicare Other | Attending: Orthopedic Surgery

## 2017-09-30 DIAGNOSIS — M6281 Muscle weakness (generalized): Secondary | ICD-10-CM | POA: Diagnosis not present

## 2017-09-30 DIAGNOSIS — R262 Difficulty in walking, not elsewhere classified: Secondary | ICD-10-CM | POA: Diagnosis not present

## 2017-09-30 DIAGNOSIS — M25562 Pain in left knee: Secondary | ICD-10-CM | POA: Diagnosis not present

## 2017-09-30 DIAGNOSIS — G8929 Other chronic pain: Secondary | ICD-10-CM | POA: Diagnosis not present

## 2017-09-30 DIAGNOSIS — M25561 Pain in right knee: Secondary | ICD-10-CM | POA: Diagnosis not present

## 2017-09-30 NOTE — Patient Instructions (Addendum)
Self-Mobilization: Downward Kneecap Push    With thumbs on upper border of left kneecap, gently push kneecap toward foot. Hold 30 seconds. Repeat 3 times per set. Do 2 sets per day.  http://orth.exer.us/582   Copyright  VHI. All rights reserved.

## 2017-09-30 NOTE — Therapy (Signed)
[facility] [address] [place], [place], [address] Phone: [phone]   Fax:  [phone]  Physical Therapy Treatment  Patient Details  Name: Martha Montgomery MRN: [id number] Date of Birth: [birth year] Referring Provider: Dr. Aline Brochure   Encounter Date: 09/30/2017  PT End of Session - 09/30/17 1524    Visit Number  2    Number of Visits  8    Date for PT Re-Evaluation  10/24/17    Authorization Type  [employer] medicare    PT Start Time  1520    PT Stop Time  1600    PT Time Calculation (min)  40 min    Activity Tolerance  Patient tolerated treatment well    Behavior During Therapy  [facility] for tasks assessed/performed       Past Medical History:  Diagnosis Date  . Anxiety   . Anxiety disorder   . Arthritis    "knees, joints, hands, toes" (09/28/2013)  . Depression   . Gastritis nov. 2011   EGD by Dr. Oneida Alar, negative h pylori  . GERD (gastroesophageal reflux disease)   . Gout   . Hypertension x 6 years   . Hypothyroidism   . Migraines    "15/month average" (09/28/2013)  . Spinal stenosis   . Thyroid goiter     Past Surgical History:  Procedure Laterality Date  . CESAREAN SECTION  2008  . CHONDROPLASTY  04/02/2012   Procedure: CHONDROPLASTY;  Surgeon: Carole Civil, MD;  Location: [facility];  Service: Orthopedics;  Laterality: Left;  of left patella  . COLONOSCOPY  May 2012   Dr. Oneida Alar: normal colon, small internal hemorrhoids  . INCISION AND DRAINAGE ABSCESS  1997; 2005   "under right buttocks; under left arm"  . THYROIDECTOMY N/A 09/28/2013   Procedure: TOTAL THYROIDECTOMY;  Surgeon: Ascencion Dike, MD;  Location: [facility];  Service: ENT;  Laterality: N/A;  . TOTAL THYROIDECTOMY  09/27/2013  . UPPER GI ENDOSCOPY  2011   EGD: gastritis, no h.pylori    There were no vitals filed for this visit.  Subjective Assessment - 09/30/17 1523    Subjective  Pt stated she has dull pain behind both knee caps, pain scale 4/10 bil knees.                         [facility] Adult PT Treatment/Exercise - 09/30/17 0001      Knee/Hip Exercises: Stretches   Quad Stretch  2 reps;30 seconds prone      Knee/Hip Exercises: Standing   Lateral Step Up  Both;10 reps;Hand Hold: 2;Step Height: 4"    Forward Step Up  Both;10 reps;Hand Hold: 2;Step Height: 4"    Functional Squat  10 reps cueing for mechanics, front of mat    SLS  Lt 11", Rt 12" max of 3      Knee/Hip Exercises: Supine   Straight Leg Raises  10 reps    Straight Leg Raise with External Rotation  5 reps      Knee/Hip Exercises: Sidelying   Hip ABduction  15 reps      Knee/Hip Exercises: Prone   Hamstring Curl  15 reps    Hip Extension  15 reps      Manual Therapy   Manual Therapy  Joint mobilization    Manual therapy comments  Manual complete separate than rest of tx    Joint Mobilization  instructed patella mobs, inferior glides 2x 30" holds  PT Education - 09/30/17 1604    Education provided  Yes    Education Details  Reviewed goals, assured compliance and proper form wth HEP and copy of eval given to pt.    Person(s) Educated  Patient    Methods  Explanation;Demonstration;Handout    Comprehension  Verbalized understanding;Returned demonstration       PT Short Term Goals - 09/24/17 1618      PT SHORT TERM GOAL #1   Title  PT pain to be no greater than a 5/10 to allow pt to walk for 15 minutes without increased pain .    Time  2    Period  Weeks    Status  New    Target Date  10/08/17      PT SHORT TERM GOAL #2   Title  Pt to be using heel to gait mechanics to improve knee pain.     Time  2    Period  Weeks    Status  New      PT SHORT TERM GOAL #3   Title  PT to be comfortable sitting for an hour to travel in the car    Time  2    Period  Weeks    Status  New        PT Long Term Goals - 09/24/17 1619      PT LONG TERM GOAL #1   Title  Pt pain to be no greater than a 3/10 to allow pt to walk for 30 minutes to  return to walking for exercise.     Time  4    Period  Weeks    Status  New    Target Date  10/22/17      PT LONG TERM GOAL #2   Title  PT LE strength B to be improved one grade to allow pt to come sit to stand with no difficulty     Time  4    Period  Weeks    Status  New      PT LONG TERM GOAL #3   Title  PT to have improved flexibility in her hips and knees to be able to don and doff her shoes and socks without difficulty.    Time  4    Period  Weeks    Status  New            Plan - 09/30/17 1531    Clinical Impression Statement  Reviewed goals, assured complaince iwht HEP, copy of eval given to pt.  Session focus with hip and knee strengthening and mobilty with stretches and instructing self patella mobs as additional HEP.  Pt able to complete all HEP exercises with no cueing required and minimal cueing for form with squats.  Noted visible muscle fatigue with stair training.  No reports of increased pain through session.  Reviewed RICE application for pain and edema control.      Rehab Potential  Good    PT Frequency  2x / week    PT Duration  4 weeks    PT Treatment/Interventions  Gait training;Stair training;Functional mobility training;Therapeutic activities;Therapeutic exercise;Balance training;Neuromuscular re-education;Patient/family education    PT Next Visit Plan  Continue hip and functional strengthening.  Increase height with step ups next session and progress balalnce as able.      PT Home Exercise Plan  hip flexion stretch, heel raises, SLS, SLR with ER, and hip abduction, hip extension; 09/30/17: inferior patella glides  Patient will benefit from skilled therapeutic intervention in order to improve the following deficits and impairments:  Abnormal gait, Decreased activity tolerance, Decreased balance, Decreased strength, Difficulty walking, Impaired flexibility, Pain, Obesity  Visit Diagnosis: Chronic pain of right knee  Chronic pain of left knee  Muscle  weakness (generalized)  Difficulty in walking, not elsewhere classified     Problem List Patient Active Problem List   Diagnosis Date Noted  . Esophageal dysphagia 09/09/2017  . Bloating 03/05/2017  . Bilateral knee pain 01/05/2017  . Amenorrhea 01/05/2017  . Bilateral hip pain 07/22/2016  . Morbid obesity ([place]) 06/30/2015  . Post-surgical hypothyroidism 06/02/2014  . HTN (hypertension), benign 06/30/2012  . Knee pain 06/02/2012  . Patellar malalignment syndrome 03/09/2012  . IBS (irritable bowel syndrome) 08/29/2011  . GERD (gastroesophageal reflux disease) 08/29/2011  . Rectal bleeding 12/10/2010  . BACK PAIN 09/24/2010  . Migraine with aura 04/02/2010  . Constipation 09/12/2009  . Allergic rhinitis 06/16/2009  . Anxiety and depression 10/18/2008   Ihor Austin, [place]; [place]  Aldona Lento 09/30/2017, 4:05 PM  [facility] [address] [place], [place], [address] Phone: [phone]   Fax:  [phone]  Name: Martha Montgomery MRN: [id number] Date of Birth: 06-19-1981

## 2017-10-01 ENCOUNTER — Ambulatory Visit (HOSPITAL_COMMUNITY): Payer: Medicare Other | Admitting: Physical Therapy

## 2017-10-01 DIAGNOSIS — M6281 Muscle weakness (generalized): Secondary | ICD-10-CM

## 2017-10-01 DIAGNOSIS — M25561 Pain in right knee: Principal | ICD-10-CM

## 2017-10-01 DIAGNOSIS — G8929 Other chronic pain: Secondary | ICD-10-CM | POA: Diagnosis not present

## 2017-10-01 DIAGNOSIS — M25562 Pain in left knee: Secondary | ICD-10-CM

## 2017-10-01 DIAGNOSIS — R262 Difficulty in walking, not elsewhere classified: Secondary | ICD-10-CM

## 2017-10-01 NOTE — Therapy (Signed)
[facility] [place], [place], [address] Phone: [phone]   Fax:  [phone]  Physical Therapy Treatment  Patient Details  Name: Martha Montgomery MRN: [id number] Date of Birth: August 29, 1980 Referring Provider: Dr. Aline Brochure   Encounter Date: 10/01/2017  PT End of Session - 10/01/17 1431    Visit Number  3    Number of Visits  8    Date for PT Re-Evaluation  10/24/17    Authorization Type  [employer]HC medicare    PT Start Time  6962    PT Stop Time  1435    PT Time Calculation (min)  40 min    Activity Tolerance  Patient tolerated treatment well    Behavior During Therapy  [facility] for tasks assessed/performed       Past Medical History:  Diagnosis Date  . Anxiety   . Anxiety disorder   . Arthritis    "knees, joints, hands, toes" (09/28/2013)  . Depression   . Gastritis nov. 2011   EGD by Dr. Oneida Alar, negative h pylori  . GERD (gastroesophageal reflux disease)   . Gout   . Hypertension x 6 years   . Hypothyroidism   . Migraines    "15/month average" (09/28/2013)  . Spinal stenosis   . Thyroid goiter     Past Surgical History:  Procedure Laterality Date  . CESAREAN SECTION  2008  . CHONDROPLASTY  04/02/2012   Procedure: CHONDROPLASTY;  Surgeon: Carole Civil, MD;  Location: [facility];  Service: Orthopedics;  Laterality: Left;  of left patella  . COLONOSCOPY  May 2012   Dr. Oneida Alar: normal colon, small internal hemorrhoids  . INCISION AND DRAINAGE ABSCESS  1997; 2005   "under right buttocks; under left arm"  . THYROIDECTOMY N/A 09/28/2013   Procedure: TOTAL THYROIDECTOMY;  Surgeon: Ascencion Dike, MD;  Location: [facility];  Service: ENT;  Laterality: N/A;  . TOTAL THYROIDECTOMY  09/27/2013  . UPPER GI ENDOSCOPY  2011   EGD: gastritis, no h.pylori    There were no vitals filed for this visit.  Subjective Assessment - 10/01/17 1401    Subjective  patient states both her knees hurt equally at 3/10.  States her pain remains behind her knee  caps    Currently in Pain?  Yes    Pain Score  3     Pain Location  Knee    Pain Orientation  Right;Left    Pain Descriptors / Indicators  Dull                      [facility] Adult PT Treatment/Exercise - 10/01/17 0001      Knee/Hip Exercises: Standing   Functional Squat  10 reps;Limitations    Functional Squat Limitations  bottom taps to mat no UE's    SLS  Lt 7", Rt 14" max of 3    SLS with Vectors  5X5" each with 1 HHA      Knee/Hip Exercises: Supine   Short Arc Quad Sets  Both;10 reps;Limitations;2 sets    Short Arc Quad Sets Limitations  one set neutral, one set ER    Straight Leg Raises  Both;15 reps    Straight Leg Raise with External Rotation  Both;15 reps      Knee/Hip Exercises: Sidelying   Hip ABduction  15 reps;Both      Knee/Hip Exercises: Prone   Hamstring Curl  15 reps    Hip Extension  15 reps;Both  PT Education - 09/30/17 1604    Education provided  Yes    Education Details  Reviewed goals, assured compliance and proper form wth HEP and copy of eval given to pt.    Person(s) Educated  Patient    Methods  Explanation;Demonstration;Handout    Comprehension  Verbalized understanding;Returned demonstration       PT Short Term Goals - 09/24/17 1618      PT SHORT TERM GOAL #1   Title  PT pain to be no greater than a 5/10 to allow pt to walk for 15 minutes without increased pain .    Time  2    Period  Weeks    Status  New    Target Date  10/08/17      PT SHORT TERM GOAL #2   Title  Pt to be using heel to gait mechanics to improve knee pain.     Time  2    Period  Weeks    Status  New      PT SHORT TERM GOAL #3   Title  PT to be comfortable sitting for an hour to travel in the car    Time  2    Period  Weeks    Status  New        PT Long Term Goals - 09/24/17 1619      PT LONG TERM GOAL #1   Title  Pt pain to be no greater than a 3/10 to allow pt to walk for 30 minutes to return to walking for exercise.     Time   4    Period  Weeks    Status  New    Target Date  10/22/17      PT LONG TERM GOAL #2   Title  PT LE strength B to be improved one grade to allow pt to come sit to stand with no difficulty     Time  4    Period  Weeks    Status  New      PT LONG TERM GOAL #3   Title  PT to have improved flexibility in her hips and knees to be able to don and doff her shoes and socks without difficulty.    Time  4    Period  Weeks    Status  New            Plan - 10/01/17 1431    Clinical Impression Statement  Pt with soreness today from yesterday's session.  continued with established therex, increasing reps of a few exercises.  Added SAQ to POC in both neutral and ER planes using 3# weight.  Pt able to complete with cues for form but without pain.  Continued issue with SLS activity today with addition of vector stance to work on stabiliziers.  Noted challenge with this activiyt, only able to complete 5 reps before fatigue.  Pt required 2 rest breaks during standing exercises due to LE's giving out.    Rehab Potential  Good    PT Frequency  2x / week    PT Duration  4 weeks    PT Treatment/Interventions  Gait training;Stair training;Functional mobility training;Therapeutic activities;Therapeutic exercise;Balance training;Neuromuscular re-education;Patient/family education    PT Next Visit Plan  Continue hip and functional strengthening.  Increase height with step ups next session and progress balance as able.  Begin tandem stance as well.    PT Home Exercise Plan  hip flexion stretch, heel raises, SLS,  SLR with ER, and hip abduction, hip extension; 09/30/17: inferior patella glides       Patient will benefit from skilled therapeutic intervention in order to improve the following deficits and impairments:  Abnormal gait, Decreased activity tolerance, Decreased balance, Decreased strength, Difficulty walking, Impaired flexibility, Pain, Obesity  Visit Diagnosis: Chronic pain of right knee  Chronic  pain of left knee  Muscle weakness (generalized)  Difficulty in walking, not elsewhere classified     Problem List Patient Active Problem List   Diagnosis Date Noted  . Esophageal dysphagia 09/09/2017  . Bloating 03/05/2017  . Bilateral knee pain 01/05/2017  . Amenorrhea 01/05/2017  . Bilateral hip pain 07/22/2016  . Morbid obesity ([place]) 06/30/2015  . Post-surgical hypothyroidism 06/02/2014  . HTN (hypertension), benign 06/30/2012  . Knee pain 06/02/2012  . Patellar malalignment syndrome 03/09/2012  . IBS (irritable bowel syndrome) 08/29/2011  . GERD (gastroesophageal reflux disease) 08/29/2011  . Rectal bleeding 12/10/2010  . BACK PAIN 09/24/2010  . Migraine with aura 04/02/2010  . Constipation 09/12/2009  . Allergic rhinitis 06/16/2009  . Anxiety and depression 10/18/2008   Teena Irani, PTA/CLT [phone]  Teena Irani 10/01/2017, 2:39 PM  [facility] [address] [place], [place], [address] Phone: [phone]   Fax:  [phone]  Name: Martha Montgomery MRN: [id number] Date of Birth: [birth year]

## 2017-10-06 ENCOUNTER — Ambulatory Visit (HOSPITAL_COMMUNITY): Payer: Medicare Other | Admitting: Physical Therapy

## 2017-10-06 ENCOUNTER — Telehealth (HOSPITAL_COMMUNITY): Payer: Self-pay | Admitting: Physical Therapy

## 2017-10-06 DIAGNOSIS — R262 Difficulty in walking, not elsewhere classified: Secondary | ICD-10-CM | POA: Diagnosis not present

## 2017-10-06 DIAGNOSIS — M25562 Pain in left knee: Secondary | ICD-10-CM | POA: Diagnosis not present

## 2017-10-06 DIAGNOSIS — G8929 Other chronic pain: Secondary | ICD-10-CM

## 2017-10-06 DIAGNOSIS — M6281 Muscle weakness (generalized): Secondary | ICD-10-CM | POA: Diagnosis not present

## 2017-10-06 DIAGNOSIS — M25561 Pain in right knee: Principal | ICD-10-CM

## 2017-10-06 NOTE — Therapy (Signed)
[facility] [address] [place], [place], [address] Phone: [phone]   Fax:  [phone]  Physical Therapy Treatment  Patient Details  Name: Martha Montgomery MRN: [id number] Date of Birth: [birth year] Referring Provider: Dr. Aline Brochure   Encounter Date: 10/06/2017  PT End of Session - 10/06/17 1652    Visit Number  4    Number of Visits  8    Date for PT Re-Evaluation  10/24/17    Authorization Type  [employer] medicare    PT Start Time  1615    PT Stop Time  1705    PT Time Calculation (min)  50 min    Activity Tolerance  Patient tolerated treatment well    Behavior During Therapy  [facility] for tasks assessed/performed       Past Medical History:  Diagnosis Date  . Anxiety   . Anxiety disorder   . Arthritis    "knees, joints, hands, toes" (09/28/2013)  . Depression   . Gastritis nov. 2011   EGD by Dr. Oneida Alar, negative h pylori  . GERD (gastroesophageal reflux disease)   . Gout   . Hypertension x 6 years   . Hypothyroidism   . Migraines    "15/month average" (09/28/2013)  . Spinal stenosis   . Thyroid goiter     Past Surgical History:  Procedure Laterality Date  . CESAREAN SECTION  2008  . CHONDROPLASTY  04/02/2012   Procedure: CHONDROPLASTY;  Surgeon: Carole Civil, MD;  Location: [facility];  Service: Orthopedics;  Laterality: Left;  of left patella  . COLONOSCOPY  May 2012   Dr. Oneida Alar: normal colon, small internal hemorrhoids  . INCISION AND DRAINAGE ABSCESS  1997; 2005   "under right buttocks; under left arm"  . THYROIDECTOMY N/A 09/28/2013   Procedure: TOTAL THYROIDECTOMY;  Surgeon: Ascencion Dike, MD;  Location: [facility];  Service: ENT;  Laterality: N/A;  . TOTAL THYROIDECTOMY  09/27/2013  . UPPER GI ENDOSCOPY  2011   EGD: gastritis, no h.pylori    There were no vitals filed for this visit.  Subjective Assessment - 10/06/17 1659    Subjective  Pt without soreness today but reports slightly increased pain possibly due to more errands  yesterday/being up on her feet alot yesterday.  Reports compliance with HEP.    Currently in Pain?  Yes    Pain Score  4     Pain Location  Knee    Pain Orientation  Right;Left    Pain Descriptors / Indicators  Aching;Dull    Pain Type  Chronic pain                      [facility] Adult PT Treatment/Exercise - 10/06/17 0001      Knee/Hip Exercises: Standing   Lateral Step Up  Both;10 reps;Step Height: 4";Hand Hold: 1;Step Height: 6"    Forward Step Up  Both;10 reps;Hand Hold: 1;Step Height: 6"    Step Down  Both;10 reps;Hand Hold: 1;Step Height: 4"    Functional Squat  Limitations;20 reps    Functional Squat Limitations  bottom taps to mat no UE's    SLS  Lt 18", Rt 14" max of 3    SLS with Vectors  10X5" each with 1 HHA    Other Standing Knee Exercises  tandem 2X30" each      Knee/Hip Exercises: Supine   Short Arc Quad Sets  Both;Limitations;20 reps    Short Arc Quad Sets Limitations  neutral 20X, ER 20X    Straight Leg Raises  Both;20 reps    Straight Leg Raise with External Rotation  Both;20 reps      Knee/Hip Exercises: Sidelying   Hip ABduction  20 reps;Both      Knee/Hip Exercises: Prone   Hamstring Curl  20 reps    Hamstring Curl Limitations  both, eccentric control    Hip Extension  20 reps;Both               PT Short Term Goals - 09/24/17 1618      PT SHORT TERM GOAL #1   Title  PT pain to be no greater than a 5/10 to allow pt to walk for 15 minutes without increased pain .    Time  2    Period  Weeks    Status  New    Target Date  10/08/17      PT SHORT TERM GOAL #2   Title  Pt to be using heel to gait mechanics to improve knee pain.     Time  2    Period  Weeks    Status  New      PT SHORT TERM GOAL #3   Title  PT to be comfortable sitting for an hour to travel in the car    Time  2    Period  Weeks    Status  New        PT Long Term Goals - 09/24/17 1619      PT LONG TERM GOAL #1   Title  Pt pain to be no greater than a  3/10 to allow pt to walk for 30 minutes to return to walking for exercise.     Time  4    Period  Weeks    Status  New    Target Date  10/22/17      PT LONG TERM GOAL #2   Title  PT LE strength B to be improved one grade to allow pt to come sit to stand with no difficulty     Time  4    Period  Weeks    Status  New      PT LONG TERM GOAL #3   Title  PT to have improved flexibility in her hips and knees to be able to don and doff her shoes and socks without difficulty.    Time  4    Period  Weeks    Status  New            Plan - 10/06/17 1653    Clinical Impression Statement  Pain remains at 4/10 this session but able to complete all exercises without pain.  Resumed step ups with addition of step downs this session.  INcreased reps of most other exericses.  PT with less fatigue only requiring one rest break today and reported reduced knee irritation at end of session.      Rehab Potential  Good    PT Frequency  2x / week    PT Duration  4 weeks    PT Treatment/Interventions  Gait training;Stair training;Functional mobility training;Therapeutic activities;Therapeutic exercise;Balance training;Neuromuscular re-education;Patient/family education    PT Next Visit Plan  Continue hip and functional strengthening.  increase reps/challenge of therex per pain tolerance.    PT Home Exercise Plan  hip flexion stretch, heel raises, SLS, SLR with ER, and hip abduction, hip extension; 09/30/17: inferior patella glides       Patient  will benefit from skilled therapeutic intervention in order to improve the following deficits and impairments:  Abnormal gait, Decreased activity tolerance, Decreased balance, Decreased strength, Difficulty walking, Impaired flexibility, Pain, Obesity  Visit Diagnosis: Chronic pain of right knee  Chronic pain of left knee  Muscle weakness (generalized)  Difficulty in walking, not elsewhere classified     Problem List Patient Active Problem List    Diagnosis Date Noted  . Esophageal dysphagia 09/09/2017  . Bloating 03/05/2017  . Bilateral knee pain 01/05/2017  . Amenorrhea 01/05/2017  . Bilateral hip pain 07/22/2016  . Morbid obesity ([place]) 06/30/2015  . Post-surgical hypothyroidism 06/02/2014  . HTN (hypertension), benign 06/30/2012  . Knee pain 06/02/2012  . Patellar malalignment syndrome 03/09/2012  . IBS (irritable bowel syndrome) 08/29/2011  . GERD (gastroesophageal reflux disease) 08/29/2011  . Rectal bleeding 12/10/2010  . BACK PAIN 09/24/2010  . Migraine with aura 04/02/2010  . Constipation 09/12/2009  . Allergic rhinitis 06/16/2009  . Anxiety and depression 10/18/2008   Teena Irani, PTA/CLT [phone]  Teena Irani 10/06/2017, 5:12 PM  [facility] [address] [place], [place], [address] Phone: [phone]   Fax:  [phone]  Name: Martha Montgomery MRN: [id number] Date of Birth: 07-Mar-1981

## 2017-10-08 ENCOUNTER — Ambulatory Visit (HOSPITAL_COMMUNITY): Payer: Medicare Other

## 2017-10-08 ENCOUNTER — Encounter (HOSPITAL_COMMUNITY): Payer: Self-pay

## 2017-10-08 DIAGNOSIS — G8929 Other chronic pain: Secondary | ICD-10-CM

## 2017-10-08 DIAGNOSIS — M6281 Muscle weakness (generalized): Secondary | ICD-10-CM

## 2017-10-08 DIAGNOSIS — M25562 Pain in left knee: Secondary | ICD-10-CM | POA: Diagnosis not present

## 2017-10-08 DIAGNOSIS — R262 Difficulty in walking, not elsewhere classified: Secondary | ICD-10-CM | POA: Diagnosis not present

## 2017-10-08 DIAGNOSIS — M25561 Pain in right knee: Secondary | ICD-10-CM | POA: Diagnosis not present

## 2017-10-08 NOTE — Patient Instructions (Signed)
Martha Montgomery  10/08/2017     @PREFPERIOPPHARMACY @   Your procedure is scheduled on  10/20/2017   Report to [facility] at  845   A.M.  Call this number if you have problems the morning of surgery:  [phone]   Remember:  Do not eat food or drink liquids after midnight.  Take these medicines the morning of surgery with A SIP OF WATER  Norvasc, flexaril or ultram , hydrocodone, levothyroxine, metoprolol, protonix, lyrica.   Do not wear jewelry, make-up or nail polish.  Do not wear lotions, powders, or perfumes, or deodorant.  Do not shave 48 hours prior to surgery.  Men may shave face and neck.  Do not bring valuables to the hospital.  [facility] is not responsible for any belongings or valuables.  Contacts, dentures or bridgework may not be worn into surgery.  Leave your suitcase in the car.  After surgery it may be brought to your room.  For patients admitted to the hospital, discharge time will be determined by your treatment team.  Patients discharged the day of surgery will not be allowed to drive home.   Name and phone number of your driver:   family Special instructions:  Follow the diet and prep instructions given to you by Dr Nona Dell office.  Please read over the following fact sheets that you were given. Anesthesia Post-op Instructions and Care and Recovery After Surgery       Esophagogastroduodenoscopy Esophagogastroduodenoscopy (EGD) is a procedure to examine the lining of the esophagus, stomach, and first part of the small intestine (duodenum). This procedure is done to check for problems such as inflammation, bleeding, ulcers, or growths. During this procedure, a long, flexible, lighted tube with a camera attached (endoscope) is inserted down the throat. Tell a health care provider about:  Any allergies you have.  All medicines you are taking, including vitamins, herbs, eye drops, creams, and over-the-counter medicines.  Any  problems you or family members have had with anesthetic medicines.  Any blood disorders you have.  Any surgeries you have had.  Any medical conditions you have.  Whether you are pregnant or may be pregnant. What are the risks? Generally, this is a safe procedure. However, problems may occur, including:  Infection.  Bleeding.  A tear (perforation) in the esophagus, stomach, or duodenum.  Trouble breathing.  Excessive sweating.  Spasms of the larynx.  A slowed heartbeat.  Low blood pressure.  What happens before the procedure?  Follow instructions from your health care provider about eating or drinking restrictions.  Ask your health care provider about: ? Changing or stopping your regular medicines. This is especially important if you are taking diabetes medicines or blood thinners. ? Taking medicines such as aspirin and ibuprofen. These medicines can thin your blood. Do not take these medicines before your procedure if your health care provider instructs you not to.  Plan to have someone take you home after the procedure.  If you wear dentures, be ready to remove them before the procedure. What happens during the procedure?  To reduce your risk of infection, your health care team will wash or sanitize their hands.  An IV tube will be put in a vein in your hand or arm. You will get medicines and fluids through this tube.  You will be given one or more of the following: ? A medicine to help you relax (  sedative). ? A medicine to numb the area (local anesthetic). This medicine may be sprayed into your throat. It will make you feel more comfortable and keep you from gagging or coughing during the procedure. ? A medicine for pain.  A mouth guard may be placed in your mouth to protect your teeth and to keep you from biting on the endoscope.  You will be asked to lie on your left side.  The endoscope will be lowered down your throat into your esophagus, stomach, and  duodenum.  Air will be put into the endoscope. This will help your health care provider see better.  The lining of your esophagus, stomach, and duodenum will be examined.  Your health care provider may: ? Take a tissue sample so it can be looked at in a lab (biopsy). ? Remove growths. ? Remove objects (foreign bodies) that are stuck. ? Treat any bleeding with medicines or other devices that stop tissue from bleeding. ? Widen (dilate) or stretch narrowed areas of your esophagus and stomach.  The endoscope will be taken out. The procedure may vary among health care providers and hospitals. What happens after the procedure?  Your blood pressure, heart rate, breathing rate, and blood oxygen level will be monitored often until the medicines you were given have worn off.  Do not eat or drink anything until the numbing medicine has worn off and your gag reflex has returned. This information is not intended to replace advice given to you by your health care provider. Make sure you discuss any questions you have with your health care provider. Document Released: 11/14/2004 Document Revised: 12/20/2015 Document Reviewed: 06/07/2015 Elsevier Interactive Patient Education  2018 [employer]. Esophagogastroduodenoscopy, Care After Refer to this sheet in the next few weeks. These instructions provide you with information about caring for yourself after your procedure. Your health care provider may also give you more specific instructions. Your treatment has been planned according to current medical practices, but problems sometimes occur. Call your health care provider if you have any problems or questions after your procedure. What can I expect after the procedure? After the procedure, it is common to have:  A sore throat.  Nausea.  Bloating.  Dizziness.  Fatigue.  Follow these instructions at home:  Do not eat or drink anything until the numbing medicine (local anesthetic) has worn off  and your gag reflex has returned. You will know that the local anesthetic has worn off when you can swallow comfortably.  Do not drive for 24 hours if you received a medicine to help you relax (sedative).  If your health care provider took a tissue sample for testing during the procedure, make sure to get your test results. This is your responsibility. Ask your health care provider or the department performing the test when your results will be ready.  Keep all follow-up visits as told by your health care provider. This is important. Contact a health care provider if:  You cannot stop coughing.  You are not urinating.  You are urinating less than usual. Get help right away if:  You have trouble swallowing.  You cannot eat or drink.  You have throat or chest pain that gets worse.  You are dizzy or light-headed.  You faint.  You have nausea or vomiting.  You have chills.  You have a fever.  You have severe abdominal pain.  You have black, tarry, or bloody stools. This information is not intended to replace advice given to you by  your health care provider. Make sure you discuss any questions you have with your health care provider. Document Released: 06/30/2012 Document Revised: 12/20/2015 Document Reviewed: 06/07/2015 Elsevier Interactive Patient Education  2018 [employer].  Esophageal Dilatation Esophageal dilatation is a procedure to open a blocked or narrowed part of the esophagus. The esophagus is the long tube in your throat that carries food and liquid from your mouth to your stomach. The procedure is also called esophageal dilation. You may need this procedure if you have a buildup of scar tissue in your esophagus that makes it difficult, painful, or even impossible to swallow. This can be caused by gastroesophageal reflux disease (GERD). In rare cases, people need this procedure because they have cancer of the esophagus or a problem with the way food moves through  the esophagus. Sometimes you may need to have another dilatation to enlarge the opening of the esophagus gradually. Tell a health care provider about:  Any allergies you have.  All medicines you are taking, including vitamins, herbs, eye drops, creams, and over-the-counter medicines.  Any problems you or family members have had with anesthetic medicines.  Any blood disorders you have.  Any surgeries you have had.  Any medical conditions you have.  Any antibiotic medicines you are required to take before dental procedures. What are the risks? Generally, this is a safe procedure. However, problems can occur and include:  Bleeding from a tear in the lining of the esophagus.  A hole (perforation) in the esophagus.  What happens before the procedure?  Do not eat or drink anything after midnight on the night before the procedure or as directed by your health care provider.  Ask your health care provider about changing or stopping your regular medicines. This is especially important if you are taking diabetes medicines or blood thinners.  Plan to have someone take you home after the procedure. What happens during the procedure?  You will be given a medicine that makes you relaxed and sleepy (sedative).  A medicine may be sprayed or gargled to numb the back of the throat.  Your health care provider can use various instruments to do an esophageal dilatation. During the procedure, the instrument used will be placed in your mouth and passed down into your esophagus. Options include: ? Simple dilators. This instrument is carefully placed in the esophagus to stretch it. ? Guided wire bougies. In this method, a flexible tube (endoscope) is used to insert a wire into the esophagus. The dilator is passed over this wire to enlarge the esophagus. Then the wire is removed. ? Balloon dilators. An endoscope with a small balloon at the end is passed down into the esophagus. Inflating the balloon  gently stretches the esophagus and opens it up. What happens after the procedure?  Your blood pressure, heart rate, breathing rate, and blood oxygen level will be monitored often until the medicines you were given have worn off.  Your throat may feel slightly sore and will probably still feel numb. This will improve slowly over time.  You will not be allowed to eat or drink until the throat numbness has resolved.  If this is a same-day procedure, you may be allowed to go home once you have been able to drink, urinate, and sit on the edge of the bed without nausea or dizziness.  If this is a same-day procedure, you should have a friend or family member with you for the next 24 hours after the procedure. This information is not  intended to replace advice given to you by your health care provider. Make sure you discuss any questions you have with your health care provider. Document Released: 09/04/2005 Document Revised: 12/20/2015 Document Reviewed: 11/23/2013 Elsevier Interactive Patient Education  [employer].  Colonoscopy, Adult A colonoscopy is an exam to look at the large intestine. It is done to check for problems, such as:  Lumps (tumors).  Growths (polyps).  Swelling (inflammation).  Bleeding.  What happens before the procedure? Eating and drinking Follow instructions from your doctor about eating and drinking. These instructions may include:  A few days before the procedure - follow a low-fiber diet. ? Avoid nuts. ? Avoid seeds. ? Avoid dried fruit. ? Avoid raw fruits. ? Avoid vegetables.  1-3 days before the procedure - follow a clear liquid diet. Avoid liquids that have red or purple dye. Drink only clear liquids, such as: ? Clear broth or bouillon. ? Black coffee or tea. ? Clear juice. ? Clear soft drinks or sports drinks. ? Gelatin dessert. ? Popsicles.  On the day of the procedure - do not eat or drink anything during the 2 hours before the  procedure.  Bowel prep If you were prescribed an oral bowel prep:  Take it as told by your doctor. Starting the day before your procedure, you will need to drink a lot of liquid. The liquid will cause you to poop (have bowel movements) until your poop is almost clear or light green.  If your skin or butt gets irritated from diarrhea, you may: ? Wipe the area with wipes that have medicine in them, such as adult wet wipes with aloe and vitamin E. ? Put something on your skin that soothes the area, such as petroleum jelly.  If you throw up (vomit) while drinking the bowel prep, take a break for up to 60 minutes. Then begin the bowel prep again. If you keep throwing up and you cannot take the bowel prep without throwing up, call your doctor.  General instructions  Ask your doctor about changing or stopping your normal medicines. This is important if you take diabetes medicines or blood thinners.  Plan to have someone take you home from the hospital or clinic. What happens during the procedure?  An IV tube may be put into one of your veins.  You will be given medicine to help you relax (sedative).  To reduce your risk of infection: ? Your doctors will wash their hands. ? Your anal area will be washed with soap.  You will be asked to lie on your side with your knees bent.  Your doctor will get a long, thin, flexible tube ready. The tube will have a camera and a light on the end.  The tube will be put into your anus.  The tube will be gently put into your large intestine.  Air will be delivered into your large intestine to keep it open. You may feel some pressure or cramping.  The camera will be used to take photos.  A small tissue sample may be removed from your body to be looked at under a microscope (biopsy). If any possible problems are found, the tissue will be sent to a lab for testing.  If small growths are found, your doctor may remove them and have them checked for  cancer.  The tube that was put into your anus will be slowly removed. The procedure may vary among doctors and hospitals. What happens after the procedure?  Your doctor  will check on you often until the medicines you were given have worn off.  Do not drive for 24 hours after the procedure.  You may have a small amount of blood in your poop.  You may pass gas.  You may have mild cramps or bloating in your belly (abdomen).  It is up to you to get the results of your procedure. Ask your doctor, or the department performing the procedure, when your results will be ready. This information is not intended to replace advice given to you by your health care provider. Make sure you discuss any questions you have with your health care provider. Document Released: 08/16/2010 Document Revised: 05/14/2016 Document Reviewed: 09/25/2015 Elsevier Interactive Patient Education  2017 [employer]  Colonoscopy, Adult, Care After This sheet gives you information about how to care for yourself after your procedure. Your health care provider may also give you more specific instructions. If you have problems or questions, contact your health care provider. What can I expect after the procedure? After the procedure, it is common to have:  A small amount of blood in your stool for 24 hours after the procedure.  Some gas.  Mild abdominal cramping or bloating.  Follow these instructions at home: General instructions   For the first 24 hours after the procedure: ? Do not drive or use machinery. ? Do not sign important documents. ? Do not drink alcohol. ? Do your regular daily activities at a slower pace than normal. ? Eat soft, easy-to-digest foods. ? Rest often.  Take over-the-counter or prescription medicines only as told by your health care provider.  It is up to you to get the results of your procedure. Ask your health care provider, or the department performing the procedure, when your  results will be ready. Relieving cramping and bloating  Try walking around when you have cramps or feel bloated.  Apply heat to your abdomen as told by your health care provider. Use a heat source that your health care provider recommends, such as a moist heat pack or a heating pad. ? Place a towel between your skin and the heat source. ? Leave the heat on for 20-30 minutes. ? Remove the heat if your skin turns bright red. This is especially important if you are unable to feel pain, heat, or cold. You may have a greater risk of getting burned. Eating and drinking  Drink enough fluid to keep your urine clear or pale yellow.  Resume your normal diet as instructed by your health care provider. Avoid heavy or fried foods that are hard to digest.  Avoid drinking alcohol for as long as instructed by your health care provider. Contact a health care provider if:  You have blood in your stool 2-3 days after the procedure. Get help right away if:  You have more than a small spotting of blood in your stool.  You pass large blood clots in your stool.  Your abdomen is swollen.  You have nausea or vomiting.  You have a fever.  You have increasing abdominal pain that is not relieved with medicine. This information is not intended to replace advice given to you by your health care provider. Make sure you discuss any questions you have with your health care provider. Document Released: 02/26/2004 Document Revised: 04/07/2016 Document Reviewed: 09/25/2015 Elsevier Interactive Patient Education  2018 [place] Anesthesia is a term that refers to techniques, procedures, and medicines that help a person stay safe and  comfortable during a medical procedure. Monitored anesthesia care, or sedation, is one type of anesthesia. Your anesthesia specialist may recommend sedation if you will be having a procedure that does not require you to be unconscious, such as:  Cataract  surgery.  A dental procedure.  A biopsy.  A colonoscopy.  During the procedure, you may receive a medicine to help you relax (sedative). There are three levels of sedation:  Mild sedation. At this level, you may feel awake and relaxed. You will be able to follow directions.  Moderate sedation. At this level, you will be sleepy. You may not remember the procedure.  Deep sedation. At this level, you will be asleep. You will not remember the procedure.  The more medicine you are given, the deeper your level of sedation will be. Depending on how you respond to the procedure, the anesthesia specialist may change your level of sedation or the type of anesthesia to fit your needs. An anesthesia specialist will monitor you closely during the procedure. Let your health care provider know about:  Any allergies you have.  All medicines you are taking, including vitamins, herbs, eye drops, creams, and over-the-counter medicines.  Any use of steroids (by mouth or as a cream).  Any problems you or family members have had with sedatives and anesthetic medicines.  Any blood disorders you have.  Any surgeries you have had.  Any medical conditions you have, such as sleep apnea.  Whether you are pregnant or may be pregnant.  Any use of cigarettes, alcohol, or street drugs. What are the risks? Generally, this is a safe procedure. However, problems may occur, including:  Getting too much medicine (oversedation).  Nausea.  Allergic reaction to medicines.  Trouble breathing. If this happens, a breathing tube may be used to help with breathing. It will be removed when you are awake and breathing on your own.  Heart trouble.  Lung trouble.  Before the procedure Staying hydrated Follow instructions from your health care provider about hydration, which may include:  Up to 2 hours before the procedure - you may continue to drink clear liquids, such as water, clear fruit juice, black  coffee, and plain tea.  Eating and drinking restrictions Follow instructions from your health care provider about eating and drinking, which may include:  8 hours before the procedure - stop eating heavy meals or foods such as meat, fried foods, or fatty foods.  6 hours before the procedure - stop eating light meals or foods, such as toast or cereal.  6 hours before the procedure - stop drinking milk or drinks that contain milk.  2 hours before the procedure - stop drinking clear liquids.  Medicines Ask your health care provider about:  Changing or stopping your regular medicines. This is especially important if you are taking diabetes medicines or blood thinners.  Taking medicines such as aspirin and ibuprofen. These medicines can thin your blood. Do not take these medicines before your procedure if your health care provider instructs you not to.  Tests and exams  You will have a physical exam.  You may have blood tests done to show: ? How well your kidneys and liver are working. ? How well your blood can clot.  General instructions  Plan to have someone take you home from the hospital or clinic.  If you will be going home right after the procedure, plan to have someone with you for 24 hours.  What happens during the procedure?  Your blood pressure,  heart rate, breathing, level of pain and overall condition will be monitored.  An IV tube will be inserted into one of your veins.  Your anesthesia specialist will give you medicines as needed to keep you comfortable during the procedure. This may mean changing the level of sedation.  The procedure will be performed. After the procedure  Your blood pressure, heart rate, breathing rate, and blood oxygen level will be monitored until the medicines you were given have worn off.  Do not drive for 24 hours if you received a sedative.  You may: ? Feel sleepy, clumsy, or nauseous. ? Feel forgetful about what happened after the  procedure. ? Have a sore throat if you had a breathing tube during the procedure. ? Vomit. This information is not intended to replace advice given to you by your health care provider. Make sure you discuss any questions you have with your health care provider. Document Released: 04/09/2005 Document Revised: 12/21/2015 Document Reviewed: 11/04/2015 Elsevier Interactive Patient Education  2018 [place], Care After These instructions provide you with information about caring for yourself after your procedure. Your health care provider may also give you more specific instructions. Your treatment has been planned according to current medical practices, but problems sometimes occur. Call your health care provider if you have any problems or questions after your procedure. What can I expect after the procedure? After your procedure, it is common to:  Feel sleepy for several hours.  Feel clumsy and have poor balance for several hours.  Feel forgetful about what happened after the procedure.  Have poor judgment for several hours.  Feel nauseous or vomit.  Have a sore throat if you had a breathing tube during the procedure.  Follow these instructions at home: For at least 24 hours after the procedure:   Do not: ? Participate in activities in which you could fall or become injured. ? Drive. ? Use heavy machinery. ? Drink alcohol. ? Take sleeping pills or medicines that cause drowsiness. ? Make important decisions or sign legal documents. ? Take care of children on your own.  Rest. Eating and drinking  Follow the diet that is recommended by your health care provider.  If you vomit, drink water, juice, or soup when you can drink without vomiting.  Make sure you have little or no nausea before eating solid foods. General instructions  Have a responsible adult stay with you until you are awake and alert.  Take over-the-counter and prescription  medicines only as told by your health care provider.  If you smoke, do not smoke without supervision.  Keep all follow-up visits as told by your health care provider. This is important. Contact a health care provider if:  You keep feeling nauseous or you keep vomiting.  You feel light-headed.  You develop a rash.  You have a fever. Get help right away if:  You have trouble breathing. This information is not intended to replace advice given to you by your health care provider. Make sure you discuss any questions you have with your health care provider. Document Released: 11/04/2015 Document Revised: 03/05/2016 Document Reviewed: 11/04/2015 Elsevier Interactive Patient Education  [employer].

## 2017-10-08 NOTE — Therapy (Signed)
[place], [place], [address] Phone: [phone]   Fax:  [phone]  Physical Therapy Treatment  Patient Details  Name: Martha Montgomery MRN: [id number] Date of Birth: 1981/05/04 Referring Provider: Dr. Aline Brochure   Encounter Date: 10/08/2017  PT End of Session - 10/08/17 1745    Visit Number  5    Number of Visits  8    Date for PT Re-Evaluation  10/24/17    Authorization Type  [employer] medicare    PT Start Time  1740 pt late for apt    PT Stop Time  1820    PT Time Calculation (min)  40 min    Activity Tolerance  Patient tolerated treatment well    Behavior During Therapy  [facility] for tasks assessed/performed       Past Medical History:  Diagnosis Date  . Anxiety   . Anxiety disorder   . Arthritis    "knees, joints, hands, toes" (09/28/2013)  . Depression   . Gastritis nov. 2011   EGD by Dr. Oneida Alar, negative h pylori  . GERD (gastroesophageal reflux disease)   . Gout   . Hypertension x 6 years   . Hypothyroidism   . Migraines    "15/month average" (09/28/2013)  . Spinal stenosis   . Thyroid goiter     Past Surgical History:  Procedure Laterality Date  . CESAREAN SECTION  2008  . CHONDROPLASTY  04/02/2012   Procedure: CHONDROPLASTY;  Surgeon: Carole Civil, MD;  Location: [facility];  Service: Orthopedics;  Laterality: Left;  of left patella  . COLONOSCOPY  May 2012   Dr. Oneida Alar: normal colon, small internal hemorrhoids  . INCISION AND DRAINAGE ABSCESS  1997; 2005   "under right buttocks; under left arm"  . THYROIDECTOMY N/A 09/28/2013   Procedure: TOTAL THYROIDECTOMY;  Surgeon: Ascencion Dike, MD;  Location: [facility];  Service: ENT;  Laterality: N/A;  . TOTAL THYROIDECTOMY  09/27/2013  . UPPER GI ENDOSCOPY  2011   EGD: gastritis, no h.pylori    There were no vitals filed for this visit.  Subjective Assessment - 10/08/17 1744    Subjective  Pt stated she is feeling good today, reports decreased Bil knee pain, 5/10  dull achey pain.      Patient Stated Goals  less pain, more mobility     Pain Score  5     Pain Location  Knee    Pain Orientation  Left;Right    Pain Descriptors / Indicators  Aching;Dull    Pain Type  Chronic pain    Pain Onset  More than a month ago    Pain Frequency  Constant    Aggravating Factors   activity    Pain Relieving Factors  hot packs    Effect of Pain on Daily Activities  limits                      [facility] Adult PT Treatment/Exercise - 10/08/17 0001      Knee/Hip Exercises: Standing   Heel Raises  20 reps    Heel Raises Limitations  Toe Raises 20 incline sliope    Forward Lunges  Both;15 reps 4in step    Lateral Step Up  Both;15 reps;Hand Hold: 1;Step Height: 4"    Forward Step Up  Both;15 reps;Hand Hold: 1;Step Height: 6"    Step Down  Both;10 reps;Hand Hold: 1;Step Height: 4"    Functional Squat  20  reps    Functional Squat Limitations  bottom taps to mat no UE's    SLS  Lt 9", Rt 12" max of 3    Other Standing Knee Exercises  Arch formation BLE; toe extension and keep arch x 5" very difficulty 5 reps             PT Education - 10/08/17 1824    Education provided  Yes    Education Details  Educated on foot inversion and knee valgus, encouraged to try new shoes to assist with foot alignment for knee pain control.  (Fleet feet and followthru with orthotics referral).      Methods  Explanation    Comprehension  Verbalized understanding;Returned demonstration       PT Short Term Goals - 09/24/17 1618      PT SHORT TERM GOAL #1   Title  PT pain to be no greater than a 5/10 to allow pt to walk for 15 minutes without increased pain .    Time  2    Period  Weeks    Status  New    Target Date  10/08/17      PT SHORT TERM GOAL #2   Title  Pt to be using heel to gait mechanics to improve knee pain.     Time  2    Period  Weeks    Status  New      PT SHORT TERM GOAL #3   Title  PT to be comfortable sitting for an hour to travel in the  car    Time  2    Period  Weeks    Status  New        PT Long Term Goals - 09/24/17 1619      PT LONG TERM GOAL #1   Title  Pt pain to be no greater than a 3/10 to allow pt to walk for 30 minutes to return to walking for exercise.     Time  4    Period  Weeks    Status  New    Target Date  10/22/17      PT LONG TERM GOAL #2   Title  PT LE strength B to be improved one grade to allow pt to come sit to stand with no difficulty     Time  4    Period  Weeks    Status  New      PT LONG TERM GOAL #3   Title  PT to have improved flexibility in her hips and knees to be able to don and doff her shoes and socks without difficulty.    Time  4    Period  Weeks    Status  New            Plan - 10/08/17 1806    Clinical Impression Statement  Pt continues to come into dept with moderate pain Bil knee.  Continued session focus with functional strengthening.  Pt able to tolerate increased reps for functional strengthening with no crepitus noted, pt reports more crepitus last session.  Noted Bil feet have excessive inversion, pt educated on ankle inversion increased valgus.  Pt educated on shoes stores that examine gait mechancis and recommend shoes to improve mechanics, pt stated she would try out this weekend.      Rehab Potential  Good    PT Frequency  2x / week    PT Duration  4 weeks    PT  Treatment/Interventions  Gait training;Stair training;Functional mobility training;Therapeutic activities;Therapeutic exercise;Balance training;Neuromuscular re-education;Patient/family education    PT Next Visit Plan  Continue hip and functional strengthening.  increase reps/challenge of therex per pain tolerance.  Add arch formation and intrisic strengthening exercises for strengthening.      PT Home Exercise Plan  hip flexion stretch, heel raises, SLS, SLR with ER, and hip abduction, hip extension; 09/30/17: inferior patella glides       Patient will benefit from skilled therapeutic intervention  in order to improve the following deficits and impairments:  Abnormal gait, Decreased activity tolerance, Decreased balance, Decreased strength, Difficulty walking, Impaired flexibility, Pain, Obesity  Visit Diagnosis: Chronic pain of right knee  Chronic pain of left knee  Muscle weakness (generalized)  Difficulty in walking, not elsewhere classified     Problem List Patient Active Problem List   Diagnosis Date Noted  . Esophageal dysphagia 09/09/2017  . Bloating 03/05/2017  . Bilateral knee pain 01/05/2017  . Amenorrhea 01/05/2017  . Bilateral hip pain 07/22/2016  . Morbid obesity ([place]) 06/30/2015  . Post-surgical hypothyroidism 06/02/2014  . HTN (hypertension), benign 06/30/2012  . Knee pain 06/02/2012  . Patellar malalignment syndrome 03/09/2012  . IBS (irritable bowel syndrome) 08/29/2011  . GERD (gastroesophageal reflux disease) 08/29/2011  . Rectal bleeding 12/10/2010  . BACK PAIN 09/24/2010  . Migraine with aura 04/02/2010  . Constipation 09/12/2009  . Allergic rhinitis 06/16/2009  . Anxiety and depression 10/18/2008   Ihor Austin, [place]; [place]  Aldona Lento 10/08/2017, 6:28 PM  [facility] [address] [place], [place], [address] Phone: [phone]   Fax:  [phone]  Name: Martha Montgomery MRN: [id number] Date of Birth: 1981/02/18

## 2017-10-13 ENCOUNTER — Telehealth (HOSPITAL_COMMUNITY): Payer: Self-pay

## 2017-10-13 ENCOUNTER — Encounter (HOSPITAL_COMMUNITY): Payer: Self-pay

## 2017-10-13 ENCOUNTER — Ambulatory Visit (HOSPITAL_COMMUNITY): Payer: Medicare Other

## 2017-10-13 ENCOUNTER — Other Ambulatory Visit: Payer: Self-pay

## 2017-10-13 ENCOUNTER — Encounter (HOSPITAL_COMMUNITY)
Admission: RE | Admit: 2017-10-13 | Discharge: 2017-10-13 | Disposition: A | Discharge status: Home / Self Care | Payer: Medicare Other | Source: Ambulatory Visit | Attending: Gastroenterology | Admitting: Gastroenterology

## 2017-10-13 DIAGNOSIS — Z01812 Encounter for preprocedural laboratory examination: Secondary | ICD-10-CM | POA: Diagnosis not present

## 2017-10-13 HISTORY — DX: Fibromyalgia: M79.7

## 2017-10-13 LAB — BASIC METABOLIC PANEL
Anion gap: 13 (ref 5–15)
BUN: 10 mg/dL (ref 6–20)
CHLORIDE: 102 mmol/L (ref 101–111)
CO2: 23 mmol/L (ref 22–32)
CREATININE: 0.87 mg/dL (ref 0.44–1.00)
Calcium: 8.5 mg/dL — ABNORMAL LOW (ref 8.9–10.3)
GFR calc non Af Amer: >60 mL/min (ref >60)
GLUCOSE: 96 mg/dL (ref 65–99)
Potassium: 3.5 mmol/L (ref 3.5–5.1)
Sodium: 138 mmol/L (ref 135–145)

## 2017-10-13 LAB — CBC
HCT: 38.5 % (ref 36.0–46.0)
Hemoglobin: 12.5 g/dL (ref 12.0–15.0)
MCH: 29.9 pg (ref 26.0–34.0)
MCHC: 32.5 g/dL (ref 30.0–36.0)
MCV: 92.1 fL (ref 78.0–100.0)
PLATELETS: 398 10*3/uL (ref 150–400)
RBC: 4.18 MIL/uL (ref 3.87–5.11)
RDW: 13.4 % (ref 11.5–15.5)
WBC: 8.3 10*3/uL (ref 4.0–10.5)

## 2017-10-13 LAB — HCG, SERUM, QUALITATIVE: Preg, Serum: NEGATIVE

## 2017-10-13 NOTE — Telephone Encounter (Signed)
She is not feeling well and really don't feel like coming in today

## 2017-10-15 ENCOUNTER — Encounter (HOSPITAL_COMMUNITY): Payer: Self-pay | Admitting: Physical Therapy

## 2017-10-15 ENCOUNTER — Ambulatory Visit (HOSPITAL_COMMUNITY): Payer: Medicare Other | Admitting: Physical Therapy

## 2017-10-15 DIAGNOSIS — M25562 Pain in left knee: Secondary | ICD-10-CM

## 2017-10-15 DIAGNOSIS — M25561 Pain in right knee: Principal | ICD-10-CM

## 2017-10-15 DIAGNOSIS — M6281 Muscle weakness (generalized): Secondary | ICD-10-CM

## 2017-10-15 DIAGNOSIS — G8929 Other chronic pain: Secondary | ICD-10-CM

## 2017-10-15 DIAGNOSIS — R262 Difficulty in walking, not elsewhere classified: Secondary | ICD-10-CM | POA: Diagnosis not present

## 2017-10-15 NOTE — Therapy (Signed)
Martha Montgomery, [place], [address] Phone: [phone]   Fax:  [phone]  Physical Therapy Treatment  Patient Details  Name: Martha Montgomery MRN: [id number] Date of Birth: [birth year] Referring Provider: Dr. Aline Brochure   Encounter Date: 10/15/2017  PT End of Session - 10/15/17 1605    Visit Number  6    Number of Visits  8    Date for PT Re-Evaluation  10/24/17    Authorization Type  [employer] medicare    Authorization - Visit Number  6    Authorization - Number of Visits  8    PT Start Time  6962    PT Stop Time  1605    PT Time Calculation (min)  40 min    Activity Tolerance  Patient tolerated treatment well    Behavior During Therapy  [facility] for tasks assessed/performed       Past Medical History:  Diagnosis Date  . Anxiety   . Anxiety disorder   . Arthritis    "knees, joints, hands, toes" (09/28/2013)  . Depression   . Fibromyalgia   . Gastritis nov. 2011   EGD by Dr. Oneida Alar, negative h pylori  . GERD (gastroesophageal reflux disease)   . Gout   . Hypertension x 6 years   . Hypothyroidism   . Migraines    "15/month average" (09/28/2013)  . Spinal stenosis   . Thyroid goiter     Past Surgical History:  Procedure Laterality Date  . CESAREAN SECTION  2008  . CHONDROPLASTY  04/02/2012   Procedure: CHONDROPLASTY;  Surgeon: Carole Civil, MD;  Location: [facility];  Service: Orthopedics;  Laterality: Left;  of left patella  . COLONOSCOPY  May 2012   Dr. Oneida Alar: normal colon, small internal hemorrhoids  . INCISION AND DRAINAGE ABSCESS  1997; 2005   "under right buttocks; under left arm"  . THYROIDECTOMY N/A 09/28/2013   Procedure: TOTAL THYROIDECTOMY;  Surgeon: Ascencion Dike, MD;  Location: [facility];  Service: ENT;  Laterality: N/A;  . TOTAL THYROIDECTOMY  09/27/2013  . UPPER GI ENDOSCOPY  2011   EGD: gastritis, no h.pylori    There were no vitals filed for this visit.  Subjective Assessment - 10/15/17 1526     Subjective  PT states that she has has been doing her exercises but her pain level has been high the last couple of days.     Pertinent History  Back pain, HTN,     How long can you sit comfortably?  able to sit for 10 minutes was 15 minutes     How long can you stand comfortably?  able to stand for 20 was  10 mintues     How long can you walk comfortably?  Able to walk for 10 minutes was 10     Patient Stated Goals  less pain, more mobility     Pain Score  7     Pain Location  Knee    Pain Orientation  Left    Pain Onset  More than a month ago    Aggravating Factors   walking     Pain Relieving Factors  not sure     Effect of Pain on Daily Activities  limites                       [facility] Adult PT Treatment/Exercise - 10/15/17 0001      Exercises   Exercises  Knee/Hip      Knee/Hip Exercises: Machines for Strengthening   Cybex Knee Extension  3 Pl x 15     Cybex Knee Flexion  4.5 PL x 15       Knee/Hip Exercises: Standing   Heel Raises  Both;20 reps    Forward Lunges  Both;15 reps    Forward Lunges Limitations  bosu     Terminal Knee Extension  Strengthening;Both;15 reps;Theraband    Theraband Level (Terminal Knee Extension)  Level 2 (Red)    Lateral Step Up  Both;10 reps;15 reps;Step Height: 6"    Forward Step Up  Both;15 reps;Step Height: 6"    Step Down  Both;10 reps;Step Height: 6"    Functional Squat  20 reps    Rocker Board  2 minutes    SLS  x 4 each leg       Knee/Hip Exercises: Seated   Sit to Sand  10 reps               PT Short Term Goals - 10/15/17 1608      PT SHORT TERM GOAL #1   Title  PT pain to be no greater than a 5/10 to allow pt to walk for 15 minutes without increased pain .    Time  2    Period  Weeks    Status  On-going      PT SHORT TERM GOAL #2   Title  Pt to be using heel to gait mechanics to improve knee pain.     Time  2    Period  Weeks    Status  On-going      PT SHORT TERM GOAL #3   Title  PT to be  comfortable sitting for an hour to travel in the car    Time  2    Period  Weeks    Status  On-going        PT Long Term Goals - 10/15/17 1608      PT LONG TERM GOAL #1   Title  Pt pain to be no greater than a 3/10 to allow pt to walk for 30 minutes to return to walking for exercise.     Time  4    Period  Weeks    Status  On-going      PT LONG TERM GOAL #2   Title  PT LE strength B to be improved one grade to allow pt to come sit to stand with no difficulty     Time  4    Period  Weeks    Status  On-going      PT LONG TERM GOAL #3   Title  PT to have improved flexibility in her hips and knees to be able to don and doff her shoes and socks without difficulty.    Time  4    Period  Weeks    Status  On-going            Plan - 10/15/17 1605    Clinical Impression Statement  Added rockerboard,  and standing terminal extension, increase step height in step ups, and increased wts with cybex machines with good tolerance from patient.  Pt needs moderate verbal cuing for proper techinque.  Pt has two more treatments and will be reassessd.     Rehab Potential  Good    PT Frequency  2x / week    PT Duration  4 weeks    PT Treatment/Interventions  Gait training;Stair training;Functional mobility training;Therapeutic activities;Therapeutic exercise;Balance training;Neuromuscular re-education;Patient/family education    PT Next Visit Plan  Begin stair climbing next visit.     PT Home Exercise Plan  hip flexion stretch, heel raises, SLS, SLR with ER, and hip abduction, hip extension; 09/30/17: inferior patella glides       Patient will benefit from skilled therapeutic intervention in order to improve the following deficits and impairments:  Abnormal gait, Decreased activity tolerance, Decreased balance, Decreased strength, Difficulty walking, Impaired flexibility, Pain, Obesity  Visit Diagnosis: Chronic pain of right knee  Chronic pain of left knee  Muscle weakness  (generalized)  Difficulty in walking, not elsewhere classified     Problem List Patient Active Problem List   Diagnosis Date Noted  . Esophageal dysphagia 09/09/2017  . Bloating 03/05/2017  . Bilateral knee pain 01/05/2017  . Amenorrhea 01/05/2017  . Bilateral hip pain 07/22/2016  . Morbid obesity ([place]) 06/30/2015  . Post-surgical hypothyroidism 06/02/2014  . HTN (hypertension), benign 06/30/2012  . Knee pain 06/02/2012  . Patellar malalignment syndrome 03/09/2012  . IBS (irritable bowel syndrome) 08/29/2011  . GERD (gastroesophageal reflux disease) 08/29/2011  . Rectal bleeding 12/10/2010  . BACK PAIN 09/24/2010  . Migraine with aura 04/02/2010  . Constipation 09/12/2009  . Allergic rhinitis 06/16/2009  . Anxiety and depression 10/18/2008    Rayetta Humphrey, PT CLT [phone] 10/15/2017, 4:09 PM  [facility] [address] [place], [place], [address] Phone: [phone]   Fax:  [phone]  Name: MORNING HALBERG MRN: [id number] Date of Birth: 1981/03/29

## 2017-10-20 ENCOUNTER — Telehealth: Payer: Self-pay | Admitting: Gastroenterology

## 2017-10-20 ENCOUNTER — Ambulatory Visit (HOSPITAL_COMMUNITY): Payer: Medicare Other | Admitting: Physical Therapy

## 2017-10-20 ENCOUNTER — Other Ambulatory Visit: Payer: Self-pay

## 2017-10-20 ENCOUNTER — Ambulatory Visit (HOSPITAL_COMMUNITY): Payer: Medicare Other | Admitting: Anesthesiology

## 2017-10-20 ENCOUNTER — Encounter (HOSPITAL_COMMUNITY): Payer: Self-pay | Admitting: *Deleted

## 2017-10-20 ENCOUNTER — Ambulatory Visit (HOSPITAL_COMMUNITY)
Admission: RE | Admit: 2017-10-20 | Discharge: 2017-10-20 | Disposition: A | Discharge status: Home / Self Care | Payer: Medicare Other | Source: Ambulatory Visit | Attending: Gastroenterology | Admitting: Gastroenterology

## 2017-10-20 ENCOUNTER — Encounter (HOSPITAL_COMMUNITY)
Admission: RE | Disposition: A | Discharge status: Home / Self Care | Payer: Self-pay | Source: Ambulatory Visit | Attending: Gastroenterology

## 2017-10-20 DIAGNOSIS — F329 Major depressive disorder, single episode, unspecified: Secondary | ICD-10-CM | POA: Insufficient documentation

## 2017-10-20 DIAGNOSIS — Z885 Allergy status to narcotic agent status: Secondary | ICD-10-CM | POA: Diagnosis not present

## 2017-10-20 DIAGNOSIS — G43909 Migraine, unspecified, not intractable, without status migrainosus: Secondary | ICD-10-CM | POA: Insufficient documentation

## 2017-10-20 DIAGNOSIS — M797 Fibromyalgia: Secondary | ICD-10-CM | POA: Diagnosis not present

## 2017-10-20 DIAGNOSIS — Z886 Allergy status to analgesic agent status: Secondary | ICD-10-CM | POA: Diagnosis not present

## 2017-10-20 DIAGNOSIS — Z79899 Other long term (current) drug therapy: Secondary | ICD-10-CM | POA: Insufficient documentation

## 2017-10-20 DIAGNOSIS — K228 Other specified diseases of esophagus: Secondary | ICD-10-CM | POA: Insufficient documentation

## 2017-10-20 DIAGNOSIS — Z793 Long term (current) use of hormonal contraceptives: Secondary | ICD-10-CM | POA: Insufficient documentation

## 2017-10-20 DIAGNOSIS — K219 Gastro-esophageal reflux disease without esophagitis: Secondary | ICD-10-CM | POA: Diagnosis not present

## 2017-10-20 DIAGNOSIS — Z87891 Personal history of nicotine dependence: Secondary | ICD-10-CM | POA: Diagnosis not present

## 2017-10-20 DIAGNOSIS — K625 Hemorrhage of anus and rectum: Secondary | ICD-10-CM

## 2017-10-20 DIAGNOSIS — K2970 Gastritis, unspecified, without bleeding: Secondary | ICD-10-CM | POA: Diagnosis not present

## 2017-10-20 DIAGNOSIS — M109 Gout, unspecified: Secondary | ICD-10-CM | POA: Insufficient documentation

## 2017-10-20 DIAGNOSIS — R1310 Dysphagia, unspecified: Secondary | ICD-10-CM | POA: Insufficient documentation

## 2017-10-20 DIAGNOSIS — Q438 Other specified congenital malformations of intestine: Secondary | ICD-10-CM | POA: Diagnosis not present

## 2017-10-20 DIAGNOSIS — Z79890 Hormone replacement therapy (postmenopausal): Secondary | ICD-10-CM | POA: Diagnosis not present

## 2017-10-20 DIAGNOSIS — F419 Anxiety disorder, unspecified: Secondary | ICD-10-CM | POA: Insufficient documentation

## 2017-10-20 DIAGNOSIS — E890 Postprocedural hypothyroidism: Secondary | ICD-10-CM | POA: Diagnosis not present

## 2017-10-20 DIAGNOSIS — K5900 Constipation, unspecified: Secondary | ICD-10-CM

## 2017-10-20 DIAGNOSIS — Z91018 Allergy to other foods: Secondary | ICD-10-CM | POA: Insufficient documentation

## 2017-10-20 DIAGNOSIS — K921 Melena: Secondary | ICD-10-CM | POA: Diagnosis not present

## 2017-10-20 DIAGNOSIS — I10 Essential (primary) hypertension: Secondary | ICD-10-CM | POA: Insufficient documentation

## 2017-10-20 DIAGNOSIS — Z7951 Long term (current) use of inhaled steroids: Secondary | ICD-10-CM | POA: Insufficient documentation

## 2017-10-20 HISTORY — PX: ESOPHAGOGASTRODUODENOSCOPY (EGD) WITH PROPOFOL: SHX5813

## 2017-10-20 HISTORY — PX: SAVORY DILATION: SHX5439

## 2017-10-20 HISTORY — PX: COLONOSCOPY WITH PROPOFOL: SHX5780

## 2017-10-20 HISTORY — PX: BIOPSY: SHX5522

## 2017-10-20 SURGERY — COLONOSCOPY WITH PROPOFOL
Anesthesia: Monitor Anesthesia Care

## 2017-10-20 MED ORDER — MIDAZOLAM HCL 5 MG/5ML IJ SOLN
INTRAMUSCULAR | Status: DC | PRN
  Administered 2017-10-20: 2 mg via INTRAVENOUS

## 2017-10-20 MED ORDER — LIDOCAINE VISCOUS 2 % MT SOLN
OROMUCOSAL | Status: AC
  Filled 2017-10-20: qty 15

## 2017-10-20 MED ORDER — CLENPIQ 10-3.5-12 MG-GM -GM/160ML PO SOLN: 1.0000 | Freq: Once | ORAL | 0 refills | Status: AC

## 2017-10-20 MED ORDER — LACTATED RINGERS IV SOLN
INTRAVENOUS | Status: DC
  Administered 2017-10-20: 10:00:00 via INTRAVENOUS

## 2017-10-20 MED ORDER — NA SULFATE-K SULFATE-MG SULF 17.5-3.13-1.6 GM/177ML PO SOLN: 1.0000 | ORAL | 0 refills | Status: DC

## 2017-10-20 MED ORDER — MIDAZOLAM HCL 2 MG/2ML IJ SOLN
INTRAMUSCULAR | Status: AC
  Filled 2017-10-20: qty 2

## 2017-10-20 MED ORDER — PROPOFOL 500 MG/50ML IV EMUL
INTRAVENOUS | Status: DC | PRN
  Administered 2017-10-20: 150 ug/kg/min via INTRAVENOUS
  Administered 2017-10-20: 11:00:00 via INTRAVENOUS

## 2017-10-20 MED ORDER — MIDAZOLAM HCL 2 MG/2ML IJ SOLN
1.0000 mg | INTRAMUSCULAR | Status: AC
  Administered 2017-10-20: 2 mg via INTRAVENOUS
  Filled 2017-10-20: qty 2

## 2017-10-20 MED ORDER — FENTANYL CITRATE (PF) 100 MCG/2ML IJ SOLN
INTRAMUSCULAR | Status: AC
Start: 2017-10-20 — End: ?
  Filled 2017-10-20: qty 2

## 2017-10-20 MED ORDER — PROPOFOL 10 MG/ML IV BOLUS
INTRAVENOUS | Status: AC
  Filled 2017-10-20: qty 20

## 2017-10-20 MED ORDER — LIDOCAINE VISCOUS 2 % MT SOLN
OROMUCOSAL | Status: DC | PRN
  Administered 2017-10-20: 6 mL via OROMUCOSAL

## 2017-10-20 MED ORDER — FENTANYL CITRATE (PF) 100 MCG/2ML IJ SOLN
25.0000 ug | Freq: Once | INTRAMUSCULAR | Status: AC
  Administered 2017-10-20: 25 ug via INTRAVENOUS

## 2017-10-20 MED ORDER — PEG 3350-KCL-NA BICARB-NACL 420 G PO SOLR: 4000.0000 mL | ORAL | 0 refills | Status: DC

## 2017-10-20 NOTE — Telephone Encounter (Signed)
See other phone note by SS re: prices for prep. Pt chose Tri-Lyte. She is aware instructions were faxed to pharmacy. Called [facility] and informed Scott.

## 2017-10-20 NOTE — Telephone Encounter (Signed)
-   Repeat colonoscopy in 1 day because the bowel preparation was suboptimal (Per SF recommendations on procedure note from today)

## 2017-10-20 NOTE — Anesthesia Preprocedure Evaluation (Signed)
Anesthesia Evaluation  Patient identified by MRN, date of birth, ID band Patient awake    Reviewed: Allergy & Precautions, H&P , NPO status , Patient's Chart, lab work & pertinent test results, reviewed documented beta blocker date and time   Airway Mallampati: I  TM Distance: >3 FB Neck ROM: Full    Dental no notable dental hx. (+) Teeth Intact   Pulmonary former smoker,    Pulmonary exam normal        Cardiovascular hypertension, Pt. on medications and Pt. on home beta blockers  Rhythm:Regular Rate:Normal     Neuro/Psych  Headaches, PSYCHIATRIC DISORDERS Anxiety Depression Spinal stenosis    GI/Hepatic Neg liver ROS, GERD  Medicated and Controlled,  Endo/Other  negative endocrine ROSHypothyroidism   Renal/GU negative Renal ROS     Musculoskeletal negative musculoskeletal ROS (+) Arthritis , Fibromyalgia -  Abdominal Normal abdominal exam  (+)   Peds  Hematology negative hematology ROS (+)   Anesthesia Other Findings   Reproductive/Obstetrics                             Anesthesia Physical Anesthesia Plan  ASA: III  Anesthesia Plan: MAC   Post-op Pain Management:    Induction: Intravenous  PONV Risk Score and Plan:   Airway Management Planned: Simple Face Mask  Additional Equipment:   Intra-op Plan:   Post-operative Plan:   Informed Consent: I have reviewed the patients History and Physical, chart, labs and discussed the procedure including the risks, benefits and alternatives for the proposed anesthesia with the patient or authorized representative who has indicated his/her understanding and acceptance.     Plan Discussed with:   Anesthesia Plan Comments:         Anesthesia Quick Evaluation

## 2017-10-20 NOTE — Telephone Encounter (Signed)
Scott from [employer] was calling to give prices of preps. Trilyte $16.57, Clenpic is $45 and Suprep is $104. He needs to know which one to get for patient. Please call him at [phone]

## 2017-10-20 NOTE — Anesthesia Postprocedure Evaluation (Signed)
Anesthesia Post Note  Patient: Martha Montgomery  Procedure(s) Performed: COLONOSCOPY WITH PROPOFOL (N/A ) ESOPHAGOGASTRODUODENOSCOPY (EGD) WITH PROPOFOL (N/A ) SAVORY DILATION (N/A ) BIOPSY  Patient location during evaluation: PACU Anesthesia Type: MAC Level of consciousness: awake and patient cooperative Pain management: pain level controlled Vital Signs Assessment: post-procedure vital signs reviewed and stable Respiratory status: spontaneous breathing, nonlabored ventilation, respiratory function stable and patient connected to nasal cannula oxygen Cardiovascular status: blood pressure returned to baseline Postop Assessment: no apparent nausea or vomiting Anesthetic complications: no     Last Vitals:  Vitals:   10/20/17 1045 10/20/17 1050  BP:    Pulse:    Resp: 17 16  Temp:    SpO2: 100% 98%    Last Pain:  Vitals:   10/20/17 1102  TempSrc:   PainSc: 4                  Wonder Donaway J

## 2017-10-20 NOTE — Transfer of Care (Signed)
Immediate Anesthesia Transfer of Care Note  Patient: AZAELA CARACCI  Procedure(s) Performed: COLONOSCOPY WITH PROPOFOL (N/A ) ESOPHAGOGASTRODUODENOSCOPY (EGD) WITH PROPOFOL (N/A ) SAVORY DILATION (N/A ) BIOPSY  Patient Location: PACU  Anesthesia Type:MAC  Level of Consciousness: drowsy and patient cooperative  Airway & Oxygen Therapy: Patient Spontanous Breathing and Patient connected to nasal cannula oxygen  Post-op Assessment: Report given to RN, Post -op Vital signs reviewed and stable and Patient moving all extremities  Post vital signs: Reviewed and stable  Last Vitals:  Vitals Value Taken Time  BP    Temp    Pulse 71 10/20/2017 11:52 AM  Resp    SpO2 78 % 10/20/2017 11:52 AM  Vitals shown include unvalidated device data.  Last Pain:  Vitals:   10/20/17 1102  TempSrc:   PainSc: 4       Patients Stated Pain Goal: 5 (22/33/61 2244)  Complications: No apparent anesthesia complications

## 2017-10-20 NOTE — Telephone Encounter (Signed)
Pt called office. Suprep was going to cost $100 so she didn't pick it up. Called [facility] to see if insurance would cover another prep. Pharmacist advised Tri-Lyte. Rx sent to pharmacy. Instructions faxed to pharmacy. Tried to call pt, her mother answered (pt was unavailable). Her mother will inform her of Tri-Lyte and instructions being sent to pharmacy.

## 2017-10-20 NOTE — Telephone Encounter (Signed)
Per Endo scheduler TCS w/Propofol will be done 10/22/17 at 7:30am (post-op nurse informed [facility]). Post-op nurse informed pt of TCS being done 10/22/17, pick up prep at pharmacy with instructions. Suprep rx sent to pharmacy per [facility] and faxed instructions to pharmacy. Pt was advised at hospital she can eat for now, start clear liquids tonight and continue clear liquids tomorrow.

## 2017-10-20 NOTE — H&P (View-Only) (Signed)
Primary Care Physician:  Fayrene Helper, MD Primary Gastroenterologist:  Dr. Oneida Alar  Pre-Procedure History & Physical: HPI:  Martha Montgomery is a 37 y.o. female here for BRBPR/ABDOMINAL PAIN.  Past Medical History:  Diagnosis Date  . Anxiety   . Anxiety disorder   . Arthritis    "knees, joints, hands, toes" (09/28/2013)  . Depression   . Fibromyalgia   . Gastritis nov. 2011   EGD by Dr. Oneida Alar, negative h pylori  . GERD (gastroesophageal reflux disease)   . Gout   . Hypertension x 6 years   . Hypothyroidism   . Migraines    "15/month average" (09/28/2013)  . Spinal stenosis   . Thyroid goiter     Past Surgical History:  Procedure Laterality Date  . CESAREAN SECTION  2008  . CHONDROPLASTY  04/02/2012   Procedure: CHONDROPLASTY;  Surgeon: Carole Civil, MD;  Location: [facility];  Service: Orthopedics;  Laterality: Left;  of left patella  . COLONOSCOPY  May 2012   Dr. Oneida Alar: normal colon, small internal hemorrhoids  . INCISION AND DRAINAGE ABSCESS  1997; 2005   "under right buttocks; under left arm"  . THYROIDECTOMY N/A 09/28/2013   Procedure: TOTAL THYROIDECTOMY;  Surgeon: Ascencion Dike, MD;  Location: [facility];  Service: ENT;  Laterality: N/A;  . TOTAL THYROIDECTOMY  09/27/2013  . UPPER GI ENDOSCOPY  2011   EGD: gastritis, no h.pylori    Prior to Admission medications   Medication Sig Start Date End Date Taking? Authorizing Provider  amLODipine (NORVASC) 2.5 MG tablet TAKE ONE TABLET BY MOUTH ONCE DAILY 06/02/17  Yes Fayrene Helper, MD  cyclobenzaprine (FLEXERIL) 10 MG tablet Take 10 mg by mouth daily as needed for muscle spasms.   Yes [provider]  diphenhydrAMINE (BENADRYL) 25 MG tablet Take 25 mg by mouth daily.   Yes [provider]  fluticasone (FLONASE) 50 MCG/ACT nasal spray Place 1 spray into both nostrils daily as needed for allergies or rhinitis.   Yes [provider]  hydrochlorothiazide (MICROZIDE) 12.5 MG capsule TAKE ONE CAPSULE  BY MOUTH ONCE DAILY 06/02/17  Yes Fayrene Helper, MD  HYDROcodone-acetaminophen (NORCO) 7.5-325 MG tablet Take 1 tablet by mouth 2 (two) times daily as needed for moderate pain.    Yes [provider]  levothyroxine (SYNTHROID) 200 MCG tablet Take 1 tablet (200 mcg total) by mouth daily before breakfast. 06/22/17  Yes Fayrene Helper, MD  lubiprostone (AMITIZA) 24 MCG capsule Take 1 capsule (24 mcg total) by mouth 2 (two) times daily with a meal. 09/09/17  Yes Mahala Menghini, PA-C  metoprolol tartrate (LOPRESSOR) 50 MG tablet TAKE ONE TABLET BY MOUTH TWICE DAILY 06/02/17  Yes Fayrene Helper, MD  OVER THE COUNTER MEDICATION Take 5 capsules by mouth daily. Kratom otc supplement   Yes [provider]  pantoprazole (PROTONIX) 40 MG tablet Take 1 tablet (40 mg total) by mouth daily. Take 30 minutes before breakfast Patient taking differently: Take 40 mg by mouth daily as needed (pain). Take 30 minutes before breakfast 03/05/17  Yes Annitta Needs, NP  potassium chloride (K-DUR) 10 MEQ tablet Take 1 tablet (10 mEq total) by mouth daily. 07/22/16  Yes Fayrene Helper, MD  pregabalin (LYRICA) 200 MG capsule Take 200 mg by mouth 3 (three) times daily.   Yes [provider]  traMADol (ULTRAM) 50 MG tablet Take 50 mg by mouth 3 (three) times daily.  02/02/17  Yes [provider]  Vitamin D, Ergocalciferol, (DRISDOL) 50000 units CAPS capsule Take 1 capsule (50,000 Units total) by mouth every 7 (seven) days. 08/08/16  Yes Fayrene Helper, MD  meclizine (ANTIVERT) 25 MG tablet Take 1 tablet (25 mg total) by mouth 3 (three) times daily as needed for dizziness. Patient not taking: Reported on 10/07/2017 08/27/17   Ezequiel Essex, MD  medroxyPROGESTERone (DEPO-PROVERA) 150 MG/ML injection Inject 150 mg into the muscle every 3 (three) months.    [provider]    Allergies as of 09/09/2017 - Review Complete 09/09/2017  Allergen Reaction Noted  . Codeine  Itching   . Ibuprofen Other (See Comments) 09/02/2016  . Soybean-containing drug products Other (See Comments) 12/10/2010    Family History  Problem Relation Age of Onset  . Arthritis Mother   . Migraines Mother   . Hypertension Mother   . Cancer Maternal Grandmother        lung  . Asthma Unknown        family history   . Colon cancer Neg Hx     Social History   Socioeconomic History  . Marital status: Married    Spouse name: Not on file  . Number of children: 1  . Years of education: Not on file  . Highest education level: Not on file  Occupational History  . Occupation: Hospital doctor: UNEMPLOYED  Social Needs  . Financial resource strain: Somewhat hard  . Food insecurity:    Worry: Never true    Inability: Never true  . Transportation needs:    Medical: No    Non-medical: No  Tobacco Use  . Smoking status: Former Smoker    Packs/day: 1.00    Years: 5.00    Pack years: 5.00    Types: Cigarettes    Last attempt to quit: 09/11/2010    Years since quitting: 7.1  . Smokeless tobacco: Never Used  . Tobacco comment: QUIT SMOKING X 3 YEARS AGO  Substance and Sexual Activity  . Alcohol use: No  . Drug use: No  . Sexual activity: Not Currently    Partners: Male    Birth control/protection: Other-see comments, Injection    Comment: Depo  Lifestyle  . Physical activity:    Days per week: 2 days    Minutes per session: 20 min  . Stress: Not at all  Relationships  . Social connections:    Talks on phone: More than three times a week    Gets together: More than three times a week    Attends religious service: 1 to 4 times per year    Active member of club or organization: Yes    Attends meetings of clubs or organizations: 1 to 4 times per year    Relationship status: Married  . Intimate partner violence:    Fear of current or ex partner: No    Emotionally abused: No    Physically abused: No    Forced sexual  activity: No  Other Topics Concern  . Not on file  Social History Narrative   Pt is currently on medicaid. From [place] and came to [place] in mid 2010    Review of Systems: See HPI, otherwise negative ROS   Physical Exam: BP 121/84   Pulse 94   Temp 98.9 F (37.2 C) (Oral)   Resp 17   SpO2 97%  General:   Alert,  pleasant and cooperative in NAD Head:  Normocephalic and atraumatic. Neck:  Supple; Lungs:  Clear throughout to auscultation.    Heart:  Regular rate and rhythm. Abdomen:  Soft, nontender and nondistended. Normal bowel sounds, without guarding, and without rebound.   Neurologic:  Alert and  oriented x4;  grossly normal neurologically.  Impression/Plan:     BRBPR/ABDOMINAL PAIN  PLAN: EGD/TCS TODAY DISCUSSED PROCEDURE, BENEFITS, & RISKS: < 1% chance of medication reaction, bleeding, perforation, or rupture of spleen/liver.

## 2017-10-20 NOTE — H&P (Signed)
Primary Care Physician:  Fayrene Helper, MD Primary Gastroenterologist:  Dr. Oneida Alar  Pre-Procedure History & Physical: HPI:  Martha Montgomery is a 37 y.o. female here for BRBPR/ABDOMINAL PAIN.  Past Medical History:  Diagnosis Date  . Anxiety   . Anxiety disorder   . Arthritis    "knees, joints, hands, toes" (09/28/2013)  . Depression   . Fibromyalgia   . Gastritis nov. 2011   EGD by Dr. Oneida Alar, negative h pylori  . GERD (gastroesophageal reflux disease)   . Gout   . Hypertension x 6 years   . Hypothyroidism   . Migraines    "15/month average" (09/28/2013)  . Spinal stenosis   . Thyroid goiter     Past Surgical History:  Procedure Laterality Date  . CESAREAN SECTION  2008  . CHONDROPLASTY  04/02/2012   Procedure: CHONDROPLASTY;  Surgeon: Carole Civil, MD;  Location: [facility];  Service: Orthopedics;  Laterality: Left;  of left patella  . COLONOSCOPY  May 2012   Dr. Oneida Alar: normal colon, small internal hemorrhoids  . INCISION AND DRAINAGE ABSCESS  1997; 2005   "under right buttocks; under left arm"  . THYROIDECTOMY N/A 09/28/2013   Procedure: TOTAL THYROIDECTOMY;  Surgeon: Ascencion Dike, MD;  Location: [facility];  Service: ENT;  Laterality: N/A;  . TOTAL THYROIDECTOMY  09/27/2013  . UPPER GI ENDOSCOPY  2011   EGD: gastritis, no h.pylori    Prior to Admission medications   Medication Sig Start Date End Date Taking? Authorizing Provider  amLODipine (NORVASC) 2.5 MG tablet TAKE ONE TABLET BY MOUTH ONCE DAILY 06/02/17  Yes Fayrene Helper, MD  cyclobenzaprine (FLEXERIL) 10 MG tablet Take 10 mg by mouth daily as needed for muscle spasms.   Yes [provider]  diphenhydrAMINE (BENADRYL) 25 MG tablet Take 25 mg by mouth daily.   Yes [provider]  fluticasone (FLONASE) 50 MCG/ACT nasal spray Place 1 spray into both nostrils daily as needed for allergies or rhinitis.   Yes [provider]  hydrochlorothiazide (MICROZIDE) 12.5 MG capsule TAKE ONE CAPSULE  BY MOUTH ONCE DAILY 06/02/17  Yes Fayrene Helper, MD  HYDROcodone-acetaminophen (NORCO) 7.5-325 MG tablet Take 1 tablet by mouth 2 (two) times daily as needed for moderate pain.    Yes [provider]  levothyroxine (SYNTHROID) 200 MCG tablet Take 1 tablet (200 mcg total) by mouth daily before breakfast. 06/22/17  Yes Fayrene Helper, MD  lubiprostone (AMITIZA) 24 MCG capsule Take 1 capsule (24 mcg total) by mouth 2 (two) times daily with a meal. 09/09/17  Yes Mahala Menghini, PA-C  metoprolol tartrate (LOPRESSOR) 50 MG tablet TAKE ONE TABLET BY MOUTH TWICE DAILY 06/02/17  Yes Fayrene Helper, MD  OVER THE COUNTER MEDICATION Take 5 capsules by mouth daily. Kratom otc supplement   Yes [provider]  pantoprazole (PROTONIX) 40 MG tablet Take 1 tablet (40 mg total) by mouth daily. Take 30 minutes before breakfast Patient taking differently: Take 40 mg by mouth daily as needed (pain). Take 30 minutes before breakfast 03/05/17  Yes Annitta Needs, NP  potassium chloride (K-DUR) 10 MEQ tablet Take 1 tablet (10 mEq total) by mouth daily. 07/22/16  Yes Fayrene Helper, MD  pregabalin (LYRICA) 200 MG capsule Take 200 mg by mouth 3 (three) times daily.   Yes [provider]  traMADol (ULTRAM) 50 MG tablet Take 50 mg by mouth 3 (three) times daily.  02/02/17  Yes [provider]  Vitamin D, Ergocalciferol, (DRISDOL) 50000 units CAPS capsule Take 1 capsule (50,000 Units total) by mouth every 7 (seven) days. 08/08/16  Yes Fayrene Helper, MD  meclizine (ANTIVERT) 25 MG tablet Take 1 tablet (25 mg total) by mouth 3 (three) times daily as needed for dizziness. Patient not taking: Reported on 10/07/2017 08/27/17   Ezequiel Essex, MD  medroxyPROGESTERone (DEPO-PROVERA) 150 MG/ML injection Inject 150 mg into the muscle every 3 (three) months.    [provider]    Allergies as of 09/09/2017 - Review Complete 09/09/2017  Allergen Reaction Noted  . Codeine  Itching   . Ibuprofen Other (See Comments) 09/02/2016  . Soybean-containing drug products Other (See Comments) 12/10/2010    Family History  Problem Relation Age of Onset  . Arthritis Mother   . Migraines Mother   . Hypertension Mother   . Cancer Maternal Grandmother        lung  . Asthma Unknown        family history   . Colon cancer Neg Hx     Social History   Socioeconomic History  . Marital status: Married    Spouse name: Not on file  . Number of children: 1  . Years of education: Not on file  . Highest education level: Not on file  Occupational History  . Occupation: Hospital doctor: UNEMPLOYED  Social Needs  . Financial resource strain: Somewhat hard  . Food insecurity:    Worry: Never true    Inability: Never true  . Transportation needs:    Medical: No    Non-medical: No  Tobacco Use  . Smoking status: Former Smoker    Packs/day: 1.00    Years: 5.00    Pack years: 5.00    Types: Cigarettes    Last attempt to quit: 09/11/2010    Years since quitting: 7.1  . Smokeless tobacco: Never Used  . Tobacco comment: QUIT SMOKING X 3 YEARS AGO  Substance and Sexual Activity  . Alcohol use: No  . Drug use: No  . Sexual activity: Not Currently    Partners: Male    Birth control/protection: Other-see comments, Injection    Comment: Depo  Lifestyle  . Physical activity:    Days per week: 2 days    Minutes per session: 20 min  . Stress: Not at all  Relationships  . Social connections:    Talks on phone: More than three times a week    Gets together: More than three times a week    Attends religious service: 1 to 4 times per year    Active member of club or organization: Yes    Attends meetings of clubs or organizations: 1 to 4 times per year    Relationship status: Married  . Intimate partner violence:    Fear of current or ex partner: No    Emotionally abused: No    Physically abused: No    Forced sexual  activity: No  Other Topics Concern  . Not on file  Social History Narrative   Pt is currently on medicaid. From [place] and came to [place] in mid 2010    Review of Systems: See HPI, otherwise negative ROS   Physical Exam: BP 121/84   Pulse 94   Temp 98.9 F (37.2 C) (Oral)   Resp 17   SpO2 97%  General:   Alert,  pleasant and cooperative in NAD Head:  Normocephalic and atraumatic. Neck:  Supple; Lungs:  Clear throughout to auscultation.    Heart:  Regular rate and rhythm. Abdomen:  Soft, nontender and nondistended. Normal bowel sounds, without guarding, and without rebound.   Neurologic:  Alert and  oriented x4;  grossly normal neurologically.  Impression/Plan:     BRBPR/ABDOMINAL PAIN  PLAN: EGD/TCS TODAY DISCUSSED PROCEDURE, BENEFITS, & RISKS: < 1% chance of medication reaction, bleeding, perforation, or rupture of spleen/liver.

## 2017-10-20 NOTE — Telephone Encounter (Signed)
Called and informed Scott at [facility] pt chose [facility].

## 2017-10-20 NOTE — Op Note (Signed)
[facility] Patient Name: Martha Montgomery Procedure Date: 10/20/2017 10:40 AM MRN: [id number] Date of Birth: May 07, 1981 Attending MD: Barney Drain MD, MD CSN: [id number] Age: 37 Admit Type: Outpatient Procedure:                Colonoscopy, INCOPLETE DUE TO POOR BOWEL PREP Indications:              Hematochezia, Constipation Providers:                Barney Drain MD, MD, Otis Peak B. Sharon Seller, RN,                            Randa Spike, Technician Referring MD:             Norwood Levo. Simpson MD, MD Medicines:                Propofol per Anesthesia Complications:            No immediate complications. Estimated Blood Loss:     Estimated blood loss: none. Procedure:                Pre-Anesthesia Assessment:                           - Prior to the procedure, a History and Physical                            was performed, and patient medications and                            allergies were reviewed. The patient's tolerance of                            previous anesthesia was also reviewed. The risks                            and benefits of the procedure and the sedation                            options and risks were discussed with the patient.                            All questions were answered, and informed consent                            was obtained. Prior Anticoagulants: The patient has                            taken no previous anticoagulant or antiplatelet                            agents. ASA Grade Assessment: II - A patient with                            mild systemic disease. After reviewing the risks  and benefits, the patient was deemed in                            satisfactory condition to undergo the procedure.                            After obtaining informed consent, the colonoscope                            was passed under direct vision. Throughout the                            procedure, the patient's blood pressure,  pulse, and                            oxygen saturations were monitored continuously. The                            EC-[id number] ([id number]) scope was introduced through                            the anus and advanced to the the hepatic flexure.                            The colonoscopy was technically difficult and                            complex due to poor bowel prep and a tortuous                            colon. Successful completion of the procedure was                            aided by straightening and shortening the scope to                            obtain bowel loop reduction and COLOWRAP. The                            patient tolerated the procedure fairly well. The                            quality of the bowel preparation was poor. The                            rectum was photographed. Scope In: 11:10:15 AM Scope Out: 11:21:07 AM Total Procedure Duration: 0 hours 10 minutes 52 seconds  Findings:      The recto-sigmoid colon, sigmoid colon and descending colon were       significantly tortuous. Impression:               - Preparation of the colon was poor.                           -  Tortuous LEFT colon. Moderate Sedation:      Per Anesthesia Care Recommendation:           - Clear liquid diet.                           - Continue present medications.                           - Repeat colonoscopy in 1 day because the bowel                            preparation was suboptimal.                           - Patient has a contact number available for                            emergencies. The signs and symptoms of potential                            delayed complications were discussed with the                            patient. Return to normal activities tomorrow.                            Written discharge instructions were provided to the                            patient. Procedure Code(s):        --- Professional ---                           [phone], 22,  Colonoscopy, flexible; diagnostic,                            including collection of specimen(s) by brushing or                            washing, when performed (separate procedure) Diagnosis Code(s):        --- Professional ---                           K92.1, Melena (includes Hematochezia)                           K59.00, Constipation, unspecified                           Q43.8, Other specified congenital malformations of                            intestine CPT copyright 2016 [employer]. All rights reserved. The codes documented in this report are preliminary and upon coder review may  be revised to meet current compliance requirements. Barney Drain, MD Barney Drain MD, MD 10/20/2017 11:59:18 AM  This report has been signed electronically. Number of Addenda: 0

## 2017-10-20 NOTE — Telephone Encounter (Signed)
Called and informed pt of prices. She wants to do [facility]. She is aware the instructions have been faxed for her to pick up.

## 2017-10-20 NOTE — Discharge Instructions (Signed)
Full Liquid Diet A full liquid diet may be used:  To help you transition from a clear liquid diet to a soft diet.  When your body is healing and can only tolerate foods that are easy to digest.  Before or after certain a procedure, test, or surgery (such as stomach or intestinal surgeries).  If you have trouble swallowing or chewing.  A full liquid diet includes fluids and foods that are liquid or will become liquid at room temperature. The full liquid diet gives you the proteins, fluids, salts, and minerals that you need for energy. If you continue this diet for more than 72 hours, talk to your health care provider about how many calories you need to consume. If you continue the diet for more than 5 days, talk to your health care provider about taking a multivitamin or a nutritional supplement. What do I need to know about a full liquid diet?  You may have any liquid.  You may have any food that becomes a liquid at room temperature. The food is considered a liquid if it can be poured off a spoon at room temperature.  Drink one serving of citrus or vitamin C-enriched fruit juice daily. What foods can I eat? Grains Any grain food that can be pureed in soup (such as crackers, pasta, and rice). Hot cereal (such as farina or oatmeal) that has been blended. Talk to your health care provider or dietitian about these foods. Vegetables Pulp-free tomato or vegetable juice. Vegetables pureed in soup. Fruits Fruit juice, including nectars and juices with pulp. Meats and Other Protein Sources Eggs in custard, eggnog mix, and eggs used in ice cream or pudding. Strained meats, like in baby food, may be allowed. Consult your health care provider. Dairy Milk and milk-based beverages, including milk shakes and instant breakfast mixes. Smooth yogurt. Pureed cottage cheese. Avoid these foods if they are not well tolerated. Beverages All beverages, including liquid nutritional supplements. Ask your  health care provider if you can have carbonated beverages. They may not be well tolerated. Condiments Iodized salt, pepper, spices, and flavorings. Cocoa powder. Vinegar, ketchup, yellow mustard, smooth sauces (such as hollandaise, cheese sauce, or white sauce), and soy sauce. Sweets and Desserts Custard, smooth pudding. Flavored gelatin. Tapioca, junket. Plain ice cream, sherbet, fruit ices. Frozen ice pops, frozen fudge pops, pudding pops, and other frozen bars with cream. Syrups, including chocolate syrup. Sugar, honey, jelly. Fats and Oils Margarine, butter, cream, sour cream, and oils. Other Broth and cream soups. Strained, broth-based soups. The items listed above may not be a complete list of recommended foods or beverages. Contact your dietitian for more options. What foods can I not eat? Grains All breads. Grains are not allowed unless they are pureed into soup. Vegetables Vegetables are not allowed unless they are juiced, or cooked and pureed into soup. Fruits Fruits are not allowed unless they are juiced. Meats and Other Protein Sources Any meat or fish. Cooked or raw eggs. Nut butters. Dairy Cheese. Condiments Stone ground mustards. Fats and Oils Fats that are coarse or chunky. Sweets and Desserts Ice cream or other frozen desserts that have any solids in them or on top, such as nuts, chocolate chips, and pieces of cookies. Cakes. Cookies. Candy. Others Soups with chunks or pieces in them. The items listed above may not be a complete list of foods and beverages to avoid. Contact your dietitian for more information. This information is not intended to replace advice given to you by your  health care provider. Make sure you discuss any questions you have with your health care provider. Document Released: 07/14/2005 Document Revised: 12/20/2015 Document Reviewed: 05/19/2013 Elsevier Interactive Patient Education  2017 [place]. You have  gastritis MOST LIKELY DUE TO ibuprofen.  I biopsied your ESOPHAGUS, stomach, AND DUODENUM.   Next colonoscopy TOMORROW WITH SUPREP W/ PROPOFOL.  DRINK WATER TO KEEP YOUR URINE LIGHT YELLOW.  FOLLOW A CLEAR LIQUID DIET.   YOUR BIOPSY RESULTS WILL BE AVAILABLE IN 7 DAYS.  FOLLOW UP IN 4 MOS.      ENDOSCOPY Care After Read the instructions outlined below and refer to this sheet in the next week. These discharge instructions provide you with general information on caring for yourself after you leave the hospital. While your treatment has been planned according to the most current medical practices available, unavoidable complications occasionally occur. If you have any problems or questions after discharge, call DR. FIELDS, [phone].  ACTIVITY  You may resume your regular activity, but move at a slower pace for the next 24 hours.   Take frequent rest periods for the next 24 hours.   Walking will help get rid of the air and reduce the bloated feeling in your belly (abdomen).   No driving for 24 hours (because of the medicine (anesthesia) used during the test).   You may shower.   Do not sign any important legal documents or operate any machinery for 24 hours (because of the anesthesia used during the test).    NUTRITION  Drink plenty of fluids.   You may resume your normal diet as instructed by your doctor.   Begin with a light meal and progress to your normal diet. Heavy or fried foods are harder to digest and may make you feel sick to your stomach (nauseated).   Avoid alcoholic beverages for 24 hours or as instructed.    MEDICATIONS  You may resume your normal medications.   WHAT YOU CAN EXPECT TODAY  Some feelings of bloating in the abdomen.   Passage of more gas than usual.   Spotting of blood in your stool or on the toilet paper  .  IF YOU HAD POLYPS REMOVED DURING THE ENDOSCOPY:  Eat a soft diet IF YOU HAVE NAUSEA, BLOATING, ABDOMINAL PAIN, OR  VOMITING.    FINDING OUT THE RESULTS OF YOUR TEST Not all test results are available during your visit. DR. Oneida Alar WILL CALL YOU WITHIN 14 DAYS OF YOUR PROCEDUE WITH YOUR RESULTS. Do not assume everything is normal if you have not heard from DR. FIELDS IN TWO WEEKS, CALL HER OFFICE AT [phone].  SEEK IMMEDIATE MEDICAL ATTENTION AND CALL THE OFFICE: [phone] IF:  You have more than a spotting of blood in your stool.   Your belly is swollen (abdominal distention).   You are nauseated or vomiting.   You have a temperature over 101F.   You have abdominal pain or discomfort that is severe or gets worse throughout the day.   Gastritis  Gastritis is an inflammation (the body's way of reacting to injury and/or infection) of the stomach. It is often caused by viral or bacterial (germ) infections. It can also be caused BY ALCOHOL, ASPIRIN, BC/GOODY POWDER'S, (IBUPROFEN) MOTRIN, OR ALEVE (NAPROXEN), chemicals (including alcohol), SPICY FOODS, and medications. This illness may be associated with generalized malaise (feeling tired, not well), UPPER ABDOMINAL STOMACH cramps, and fever. One common bacterial cause of gastritis is an organism known as H. Pylori. This  can be treated with antibiotics.    High-Fiber Diet A high-fiber diet changes your normal diet to include more whole grains, legumes, fruits, and vegetables. Changes in the diet involve replacing refined carbohydrates with unrefined foods. The calorie level of the diet is essentially unchanged. The Dietary Reference Intake (recommended amount) for adult males is 38 grams per day. For adult females, it is 25 grams per day. Pregnant and lactating women should consume 28 grams of fiber per day.Fiber is the intact part of a plant that is not broken down during digestion. Functional fiber is fiber that has been isolated from the plant to provide a beneficial effect in the body.  PURPOSE  Increase stool bulk.   Ease and regulate bowel  movements.   Lower cholesterol.   REDUCE RISK OF COLON CANCER  INDICATIONS THAT YOU NEED MORE FIBER  Constipation and hemorrhoids.   Uncomplicated diverticulosis (intestine condition) and irritable bowel syndrome.   Weight management.   As a protective measure against hardening of the arteries (atherosclerosis), diabetes, and cancer.   GUIDELINES FOR INCREASING FIBER IN THE DIET  Start adding fiber to the diet slowly. A gradual increase of about 5 more grams (2 slices of whole-wheat bread, 2 servings of most fruits or vegetables, or 1 bowl of high-fiber cereal) per day is best. Too rapid an increase in fiber may result in constipation, flatulence, and bloating.   Drink enough water and fluids to keep your urine clear or pale yellow. Water, juice, or caffeine-free drinks are recommended. Not drinking enough fluid may cause constipation.   Eat a variety of high-fiber foods rather than one type of fiber.   Try to increase your intake of fiber through using high-fiber foods rather than fiber pills or supplements that contain small amounts of fiber.   The goal is to change the types of food eaten. Do not supplement your present diet with high-fiber foods, but replace foods in your present diet.   INCLUDE A VARIETY OF FIBER SOURCES  Replace refined and processed grains with whole grains, canned fruits with fresh fruits, and incorporate other fiber sources. White rice, white breads, and most bakery goods contain little or no fiber.   Brown whole-grain rice, buckwheat oats, and many fruits and vegetables are all good sources of fiber. These include: broccoli, Brussels sprouts, cabbage, cauliflower, beets, sweet potatoes, white potatoes (skin on), carrots, tomatoes, eggplant, squash, berries, fresh fruits, and dried fruits.   Cereals appear to be the richest source of fiber. Cereal fiber is found in whole grains and bran. Bran is the fiber-rich outer coat of cereal grain, which is largely  removed in refining. In whole-grain cereals, the bran remains. In breakfast cereals, the largest amount of fiber is found in those with "bran" in their names. The fiber content is sometimes indicated on the label.   You may need to include additional fruits and vegetables each day.   In baking, for 1 cup white flour, you may use the following substitutions:   1 cup whole-wheat flour minus 2 tablespoons.   1/2 cup white flour plus 1/2 cup whole-wheat flour.   Low-Fat Diet BREADS, CEREALS, PASTA, RICE, DRIED PEAS, AND BEANS These products are high in carbohydrates and most are low in fat. Therefore, they can be increased in the diet as substitutes for fatty foods. They too, however, contain calories and should not be eaten in excess. Cereals can be eaten for snacks as well as for breakfast.  Include foods that contain fiber (fruits,  vegetables, whole grains, and legumes). Research shows that fiber may lower blood cholesterol levels, especially the water-soluble fiber found in fruits, vegetables, oat products, and legumes. FRUITS AND VEGETABLES It is good to eat fruits and vegetables. Besides being sources of fiber, both are rich in vitamins and some minerals. They help you get the daily allowances of these nutrients. Fruits and vegetables can be used for snacks and desserts. MEATS Limit lean meat, chicken, [place], and fish to no more than 6 ounces per day. Beef, Pork, and Lamb Use lean cuts of beef, pork, and lamb. Lean cuts include:  Extra-lean ground beef.  Arm roast.  Sirloin tip.  Center-cut ham.  Round steak.  Loin chops.  Rump roast.  Tenderloin.  Trim all fat off the outside of meats before cooking. It is not necessary to severely decrease the intake of red meat, but lean choices should be made. Lean meat is rich in protein and contains a highly absorbable form of iron. Premenopausal women, in particular, should avoid reducing lean red meat because this could increase the risk for  low red blood cells (iron-deficiency anemia). The organ meats, such as liver, sweetbreads, kidneys, and brain are very rich in cholesterol. They should be limited. Chicken and [place] These are good sources of protein. The fat of poultry can be reduced by removing the skin and underlying fat layers before cooking. Chicken and [place] can be substituted for lean red meat in the diet. Poultry should not be fried or covered with high-fat sauces. Fish and Shellfish Fish is a good source of protein. Shellfish contain cholesterol, but they usually are low in saturated fatty acids. The preparation of fish is important. Like chicken and [place], they should not be fried or covered with high-fat sauces. EGGS Egg whites contain no fat or cholesterol. They can be eaten often. Try 1 to 2 egg whites instead of whole eggs in recipes or use egg substitutes that do not contain yolk. MILK AND DAIRY PRODUCTS Use skim or 1% milk instead of 2% or whole milk. Decrease whole milk, natural, and processed cheeses. Use nonfat or low-fat (2%) cottage cheese or low-fat cheeses made from vegetable oils. Choose nonfat or low-fat (1 to 2%) yogurt. Experiment with evaporated skim milk in recipes that call for heavy cream. Substitute low-fat yogurt or low-fat cottage cheese for sour cream in dips and salad dressings. Have at least 2 servings of low-fat dairy products, such as 2 glasses of skim (or 1%) milk each day to help get your daily calcium intake.  FATS AND OILS Reduce the total intake of fats, especially saturated fat. Butterfat, lard, and beef fats are high in saturated fat and cholesterol. These should be avoided as much as possible. Vegetable fats do not contain cholesterol, but certain vegetable fats, such as coconut oil, palm oil, and palm kernel oil are very high in saturated fats. These should be limited. These fats are often used in bakery goods, processed foods, popcorn, oils, and nondairy creamers. Vegetable shortenings  and some peanut butters contain hydrogenated oils, which are also saturated fats. Read the labels on these foods and check for saturated vegetable oils. Unsaturated vegetable oils and fats do not raise blood cholesterol. However, they should be limited because they are fats and are high in calories. Total fat should still be limited to 30% of your daily caloric intake. Desirable liquid vegetable oils are corn oil, cottonseed oil, olive oil, canola oil, safflower oil, soybean oil, and sunflower oil. Peanut oil is not  as good, but small amounts are acceptable. Buy a heart-healthy tub margarine that has no partially hydrogenated oils in the ingredients. Mayonnaise and salad dressings often are made from unsaturated fats, but they should also be limited because of their high calorie and fat content. Seeds, nuts, peanut butter, olives, and avocados are high in fat, but the fat is mainly the unsaturated type. These foods should be limited mainly to avoid excess calories and fat. OTHER EATING TIPS Snacks  Most sweets should be limited as snacks. They tend to be rich in calories and fats, and their caloric content outweighs their nutritional value. Some good choices in snacks are graham crackers, melba toast, soda crackers, bagels (no egg), English muffins, fruits, and vegetables. These snacks are preferable to snack crackers, [place] fries, and chips. Popcorn should be air-popped or cooked in small amounts of liquid vegetable oil. Desserts Eat fruit, low-fat yogurt, and fruit ices. AVOID pastries, cake, and cookies. Sherbet, angel food cake, gelatin dessert, frozen low-fat yogurt, or other frozen products that do not contain saturated fat (pure fruit juice bars, frozen ice pops) are also acceptable.  COOKING METHODS Choose those methods that use little or no fat. They include: Poaching.  Braising.  Steaming.  Grilling.  Baking.  Stir-frying.  Broiling.  Microwaving.  Foods can be cooked in a nonstick pan  without added fat, or use a nonfat cooking spray in regular cookware. Limit fried foods and avoid frying in saturated fat. Add moisture to lean meats by using water, broth, cooking wines, and other nonfat or low-fat sauces along with the cooking methods mentioned above. Soups and stews should be chilled after cooking. The fat that forms on top after a few hours in the refrigerator should be skimmed off. When preparing meals, avoid using excess salt. Salt can contribute to raising blood pressure in some people. EATING AWAY FROM HOME Order entres, potatoes, and vegetables without sauces or butter. When meat exceeds the size of a deck of cards (3 to 4 ounces), the rest can be taken home for another meal. Choose vegetable or fruit salads and ask for low-calorie salad dressings to be served on the side. Use dressings sparingly. Limit high-fat toppings, such as bacon, crumbled eggs, cheese, sunflower seeds, and olives. Ask for heart-healthy tub margarine instead of butter.  Hemorrhoids Hemorrhoids are dilated (enlarged) veins around the rectum. Sometimes clots will form in the veins. This makes them swollen and painful. These are called thrombosed hemorrhoids. Causes of hemorrhoids include:  Constipation.   Straining to have a bowel movement.   HEAVY LIFTING  HOME CARE INSTRUCTIONS  Eat a well balanced diet and drink 6 to 8 glasses of water every day to avoid constipation. You may also use a bulk laxative.   Avoid straining to have bowel movements.   Keep anal area dry and clean.   Do not use a donut shaped pillow or sit on the toilet for long periods. This increases blood pooling and pain.   Move your bowels when your body has the urge; this will require less straining and will decrease pain and pressure.

## 2017-10-20 NOTE — Op Note (Signed)
[facility] Patient Name: Martha Montgomery Procedure Date: 10/20/2017 11:23 AM MRN: [id number] Date of Birth: 01-10-1981 Attending MD: Barney Drain MD, MD CSN: [id number] Age: 37 Admit Type: Outpatient Procedure:                Upper GI endoscopy WITH ESOPHAGEAL DILATION/COLD                            FORCEP BIOPSY Indications:              Dyspepsia, Dysphagia, Esophageal reflux symptoms                            that persist despite appropriate therapy(BMI                            37.55). LAST EGD 2011-NL DUO Bx. GAINED 72 LBS                            SINCE 2011(WAS 145 LBS AND NOW 217 LBS) Providers:                Barney Drain MD, MD, Otis Peak B. Sharon Seller, RN,                            Randa Spike, Technician Referring MD:             Norwood Levo. Simpson MD, MD Medicines:                Propofol per Anesthesia Complications:            No immediate complications. Estimated Blood Loss:     Estimated blood loss was minimal. Procedure:                Pre-Anesthesia Assessment:                           - Prior to the procedure, a History and Physical                            was performed, and patient medications and                            allergies were reviewed. The patient's tolerance of                            previous anesthesia was also reviewed. The risks                            and benefits of the procedure and the sedation                            options and risks were discussed with the patient.                            All questions were answered, and informed consent  was obtained. Prior Anticoagulants: The patient has                            taken no previous anticoagulant or antiplatelet                            agents. ASA Grade Assessment: II - A patient with                            mild systemic disease. After reviewing the risks                            and benefits, the patient was deemed in                 satisfactory condition to undergo the procedure.                            After obtaining informed consent, the endoscope was                            passed under direct vision. Throughout the                            procedure, the patient's blood pressure, pulse, and                            oxygen saturations were monitored continuously. The                            [id number]) scope was introduced through the                            mouth, and advanced to the second part of duodenum.                            The upper GI endoscopy was accomplished without                            difficulty. The patient tolerated the procedure                            well. Scope In: 11:27:01 AM Scope Out: 11:42:27 AM Total Procedure Duration: 0 hours 15 minutes 26 seconds  Findings:      No endoscopic abnormality was evident in the esophagus to explain the       patient's complaint of dysphagia. It was decided, however, to proceed       with dilation of the entire esophagus. A guidewire was placed and the       scope was withdrawn. Dilation was performed with a Savary dilator with       mild resistance at 17 mm. Estimated blood loss was minimal. Biopsies       were obtained from the proximal(20 CM FROM THE INCISORS: BTL 4) and       distal esophagus(33 CM FROM THE INCISORS BTL 3)  with cold forceps for       histology of suspected eosinophilic esophagitis. EGJ 38 CMFROM TEH       INCISORS      Patchy mild inflammation was found in the gastric antrum. Biopsies were       taken with a cold forceps for Helicobacter pylori testing(2: BODY, 3:       ANTRUM BTL 2).      The examined duodenum was normal. Biopsies were taken with a cold       forceps for histology(BTL1 CONTAINS GASTRIC AND DUDOENAL MUCOSA). Impression:               - No endoscopic esophageal abnormality to explain                            patient's dysphagia. Esophagus dilated DUE TO                             POSISBLE PROXIMAL ESOPHAGEAL WEB. SYMPTOMS MOST                            LIKLEY DUE TO GERD.                           - MILD NSAID GASTRITIS Moderate Sedation:      Per Anesthesia Care Recommendation:           - Clear liquid diet.                           - Continue present medications.                           - Return to my office in 4 months.                           - Patient has a contact number available for                            emergencies. The signs and symptoms of potential                            delayed complications were discussed with the                            patient. Return to normal activities tomorrow.                            Written discharge instructions were provided to the                            patient. Procedure Code(s):        --- Professional ---                           [id number], Esophagogastroduodenoscopy, flexible,  transoral; with insertion of guide wire followed by                            passage of dilator(s) through esophagus over guide                            wire                           [id number], Esophagogastroduodenoscopy, flexible,                            transoral; with biopsy, single or multiple Diagnosis Code(s):        --- Professional ---                           R13.10, Dysphagia, unspecified                           K29.70, Gastritis, unspecified, without bleeding                           R10.13, Epigastric pain                           K21.9, Gastro-esophageal reflux disease without                            esophagitis CPT copyright 2016 [employer]. All rights reserved. The codes documented in this report are preliminary and upon coder review may  be revised to meet current compliance requirements. Barney Drain, MD Barney Drain MD, MD 10/20/2017 12:07:39 PM This report has been signed electronically. Number of Addenda: 0

## 2017-10-21 ENCOUNTER — Ambulatory Visit: Payer: Self-pay | Admitting: Family Medicine

## 2017-10-22 ENCOUNTER — Encounter (HOSPITAL_COMMUNITY): Payer: Self-pay

## 2017-10-22 ENCOUNTER — Telehealth (HOSPITAL_COMMUNITY): Payer: Self-pay | Admitting: Family Medicine

## 2017-10-22 ENCOUNTER — Ambulatory Visit (HOSPITAL_COMMUNITY): Payer: Medicare Other

## 2017-10-22 ENCOUNTER — Ambulatory Visit (HOSPITAL_COMMUNITY): Payer: Medicare Other | Admitting: Anesthesiology

## 2017-10-22 ENCOUNTER — Encounter (HOSPITAL_COMMUNITY)
Admission: RE | Disposition: A | Discharge status: Home / Self Care | Payer: Self-pay | Source: Ambulatory Visit | Attending: Gastroenterology

## 2017-10-22 ENCOUNTER — Ambulatory Visit (HOSPITAL_COMMUNITY)
Admission: RE | Admit: 2017-10-22 | Discharge: 2017-10-22 | Disposition: A | Discharge status: Home / Self Care | Payer: Medicare Other | Source: Ambulatory Visit | Attending: Gastroenterology | Admitting: Gastroenterology

## 2017-10-22 DIAGNOSIS — Z886 Allergy status to analgesic agent status: Secondary | ICD-10-CM | POA: Diagnosis not present

## 2017-10-22 DIAGNOSIS — M19042 Primary osteoarthritis, left hand: Secondary | ICD-10-CM | POA: Insufficient documentation

## 2017-10-22 DIAGNOSIS — Z87891 Personal history of nicotine dependence: Secondary | ICD-10-CM | POA: Insufficient documentation

## 2017-10-22 DIAGNOSIS — I10 Essential (primary) hypertension: Secondary | ICD-10-CM | POA: Diagnosis not present

## 2017-10-22 DIAGNOSIS — K648 Other hemorrhoids: Secondary | ICD-10-CM | POA: Diagnosis not present

## 2017-10-22 DIAGNOSIS — F329 Major depressive disorder, single episode, unspecified: Secondary | ICD-10-CM | POA: Diagnosis not present

## 2017-10-22 DIAGNOSIS — M19079 Primary osteoarthritis, unspecified ankle and foot: Secondary | ICD-10-CM | POA: Diagnosis not present

## 2017-10-22 DIAGNOSIS — K625 Hemorrhage of anus and rectum: Secondary | ICD-10-CM

## 2017-10-22 DIAGNOSIS — Q438 Other specified congenital malformations of intestine: Secondary | ICD-10-CM | POA: Insufficient documentation

## 2017-10-22 DIAGNOSIS — Z888 Allergy status to other drugs, medicaments and biological substances status: Secondary | ICD-10-CM | POA: Diagnosis not present

## 2017-10-22 DIAGNOSIS — Z79899 Other long term (current) drug therapy: Secondary | ICD-10-CM | POA: Diagnosis not present

## 2017-10-22 DIAGNOSIS — M19041 Primary osteoarthritis, right hand: Secondary | ICD-10-CM | POA: Diagnosis not present

## 2017-10-22 DIAGNOSIS — Z885 Allergy status to narcotic agent status: Secondary | ICD-10-CM | POA: Insufficient documentation

## 2017-10-22 DIAGNOSIS — M109 Gout, unspecified: Secondary | ICD-10-CM | POA: Diagnosis not present

## 2017-10-22 DIAGNOSIS — E039 Hypothyroidism, unspecified: Secondary | ICD-10-CM | POA: Insufficient documentation

## 2017-10-22 DIAGNOSIS — M170 Bilateral primary osteoarthritis of knee: Secondary | ICD-10-CM | POA: Diagnosis not present

## 2017-10-22 DIAGNOSIS — K219 Gastro-esophageal reflux disease without esophagitis: Secondary | ICD-10-CM | POA: Diagnosis not present

## 2017-10-22 DIAGNOSIS — M797 Fibromyalgia: Secondary | ICD-10-CM | POA: Insufficient documentation

## 2017-10-22 DIAGNOSIS — F419 Anxiety disorder, unspecified: Secondary | ICD-10-CM | POA: Insufficient documentation

## 2017-10-22 DIAGNOSIS — K5900 Constipation, unspecified: Secondary | ICD-10-CM | POA: Diagnosis not present

## 2017-10-22 DIAGNOSIS — K921 Melena: Secondary | ICD-10-CM

## 2017-10-22 HISTORY — PX: COLONOSCOPY WITH PROPOFOL: SHX5780

## 2017-10-22 SURGERY — COLONOSCOPY WITH PROPOFOL
Anesthesia: Monitor Anesthesia Care

## 2017-10-22 MED ORDER — CHLORHEXIDINE GLUCONATE CLOTH 2 % EX PADS: 6.0000 | MEDICATED_PAD | Freq: Once | CUTANEOUS | Status: DC

## 2017-10-22 MED ORDER — FENTANYL CITRATE (PF) 100 MCG/2ML IJ SOLN
INTRAMUSCULAR | Status: AC
  Filled 2017-10-22: qty 2

## 2017-10-22 MED ORDER — LACTATED RINGERS IV SOLN
INTRAVENOUS | Status: DC
  Administered 2017-10-22: 07:00:00 via INTRAVENOUS

## 2017-10-22 MED ORDER — MIDAZOLAM HCL 2 MG/2ML IJ SOLN
INTRAMUSCULAR | Status: AC
  Filled 2017-10-22: qty 2

## 2017-10-22 MED ORDER — MIDAZOLAM HCL 2 MG/2ML IJ SOLN
1.0000 mg | INTRAMUSCULAR | Status: AC
  Administered 2017-10-22: 2 mg via INTRAVENOUS

## 2017-10-22 MED ORDER — ROCURONIUM BROMIDE 50 MG/5ML IV SOLN
INTRAVENOUS | Status: AC
  Filled 2017-10-22: qty 1

## 2017-10-22 MED ORDER — SIMETHICONE 40 MG/0.6ML PO SUSP
ORAL | Status: AC
  Filled 2017-10-22: qty 0.6

## 2017-10-22 MED ORDER — PROPOFOL 10 MG/ML IV BOLUS
INTRAVENOUS | Status: DC | PRN
  Administered 2017-10-22 (×5): 20 mg via INTRAVENOUS

## 2017-10-22 MED ORDER — PROPOFOL 500 MG/50ML IV EMUL
INTRAVENOUS | Status: DC | PRN
  Administered 2017-10-22: 75 ug/kg/min via INTRAVENOUS
  Administered 2017-10-22: 100 ug/kg/min via INTRAVENOUS

## 2017-10-22 MED ORDER — PROPOFOL 10 MG/ML IV BOLUS
INTRAVENOUS | Status: AC
  Filled 2017-10-22: qty 60

## 2017-10-22 MED ORDER — FENTANYL CITRATE (PF) 100 MCG/2ML IJ SOLN
25.0000 ug | Freq: Once | INTRAMUSCULAR | Status: AC
  Administered 2017-10-22: 25 ug via INTRAVENOUS

## 2017-10-22 MED ORDER — SUCCINYLCHOLINE CHLORIDE 20 MG/ML IJ SOLN
INTRAMUSCULAR | Status: AC
  Filled 2017-10-22: qty 1

## 2017-10-22 NOTE — Interval H&P Note (Signed)
History and Physical Interval Note:  10/22/2017 7:21 AM  Martha Montgomery  has presented today for surgery, with the diagnosis of rectal bleeding, constipation  The various methods of treatment have been discussed with the patient and family. After consideration of risks, benefits and other options for treatment, the patient has consented to  Procedure(s) with comments: COLONOSCOPY WITH PROPOFOL (N/A) - 7:30am as a surgical intervention .  The patient's history has been reviewed, patient examined, no change in status, stable for surgery.  I have reviewed the patient's chart and labs.  Questions were answered to the patient's satisfaction.     [employer]

## 2017-10-22 NOTE — Addendum Note (Signed)
Addendum  created 10/22/17 1237 by Vista Deck, CRNA   Intraprocedure Flowsheets edited

## 2017-10-22 NOTE — Progress Notes (Signed)
Pt is aware.  

## 2017-10-22 NOTE — Discharge Instructions (Signed)
You have SMALL internal hemorrhoids, WHICH CAN CAUSE INTERMITTENT RECTAL BLEEDING. YOU DID NOT HAVE ANY POLYPS. THE LAST PART OF YOUR SMALL BOWEL IS NORMAL.    DRINK WATER TO KEEP YOUR URINE LIGHT YELLOW.  FOLLOW A HIGH FIBER DIET. AVOID ITEMS THAT CAUSE BLOATING. SEE INFO BELOW.  USE PREPARATION H FOUR TIMES  A DAY IF NEEDED TO RELIEVE RECTAL PAIN/PRESSURE/BLEEDING.  CONTINUE AMITIZA.   FOLLOW UP IN 4 MOS.   Next colonoscopy in 10 years.  Colonoscopy Care After Read the instructions outlined below and refer to this sheet in the next week. These discharge instructions provide you with general information on caring for yourself after you leave the hospital. While your treatment has been planned according to the most current medical practices available, unavoidable complications occasionally occur. If you have any problems or questions after discharge, call DR. Elyn Krogh, [phone].  ACTIVITY  You may resume your regular activity, but move at a slower pace for the next 24 hours.   Take frequent rest periods for the next 24 hours.   Walking will help get rid of the air and reduce the bloated feeling in your belly (abdomen).   No driving for 24 hours (because of the medicine (anesthesia) used during the test).   You may shower.   Do not sign any important legal documents or operate any machinery for 24 hours (because of the anesthesia used during the test).    NUTRITION  Drink plenty of fluids.   You may resume your normal diet as instructed by your doctor.   Begin with a light meal and progress to your normal diet. Heavy or fried foods are harder to digest and may make you feel sick to your stomach (nauseated).   Avoid alcoholic beverages for 24 hours or as instructed.    MEDICATIONS  You may resume your normal medications.   WHAT YOU CAN EXPECT TODAY  Some feelings of bloating in the abdomen.   Passage of more gas than usual.   Spotting of blood in your stool  or on the toilet paper  .  IF YOU HAD POLYPS REMOVED DURING THE COLONOSCOPY:  Eat a soft diet IF YOU HAVE NAUSEA, BLOATING, ABDOMINAL PAIN, OR VOMITING.    FINDING OUT THE RESULTS OF YOUR TEST Not all test results are available during your visit. DR. Oneida Alar WILL CALL YOU WITHIN 14 DAYS OF YOUR PROCEDUE WITH YOUR RESULTS. Do not assume everything is normal if you have not heard from DR. Neala Miggins, CALL HER OFFICE AT [phone].  SEEK IMMEDIATE MEDICAL ATTENTION AND CALL THE OFFICE: [phone] IF:  You have more than a spotting of blood in your stool.   Your belly is swollen (abdominal distention).   You are nauseated or vomiting.   You have a temperature over 101F.   You have abdominal pain or discomfort that is severe or gets worse throughout the day.  High-Fiber Diet A high-fiber diet changes your normal diet to include more whole grains, legumes, fruits, and vegetables. Changes in the diet involve replacing refined carbohydrates with unrefined foods. The calorie level of the diet is essentially unchanged. The Dietary Reference Intake (recommended amount) for adult males is 38 grams per day. For adult females, it is 25 grams per day. Pregnant and lactating women should consume 28 grams of fiber per day. Fiber is the intact part of a plant that is not broken down during digestion. Functional fiber is fiber that has been isolated from the plant to provide a beneficial  effect in the body. PURPOSE  Increase stool bulk.   Ease and regulate bowel movements.   Lower cholesterol.   REDUCE RISK OF COLON CANCER  INDICATIONS THAT YOU NEED MORE FIBER  Constipation and hemorrhoids.   Uncomplicated diverticulosis (intestine condition) and irritable bowel syndrome.   Weight management.   As a protective measure against hardening of the arteries (atherosclerosis), diabetes, and cancer.   GUIDELINES FOR INCREASING FIBER IN THE DIET  Start adding fiber to the diet slowly. A gradual  increase of about 5 more grams (2 slices of whole-wheat bread, 2 servings of most fruits or vegetables, or 1 bowl of high-fiber cereal) per day is best. Too rapid an increase in fiber may result in constipation, flatulence, and bloating.   Drink enough water and fluids to keep your urine clear or pale yellow. Water, juice, or caffeine-free drinks are recommended. Not drinking enough fluid may cause constipation.   Eat a variety of high-fiber foods rather than one type of fiber.   Try to increase your intake of fiber through using high-fiber foods rather than fiber pills or supplements that contain small amounts of fiber.   The goal is to change the types of food eaten. Do not supplement your present diet with high-fiber foods, but replace foods in your present diet.    INCLUDE A VARIETY OF FIBER SOURCES  Replace refined and processed grains with whole grains, canned fruits with fresh fruits, and incorporate other fiber sources. White rice, white breads, and most bakery goods contain little or no fiber.   Brown whole-grain rice, buckwheat oats, and many fruits and vegetables are all good sources of fiber. These include: broccoli, Brussels sprouts, cabbage, cauliflower, beets, sweet potatoes, white potatoes (skin on), carrots, tomatoes, eggplant, squash, berries, fresh fruits, and dried fruits.   Cereals appear to be the richest source of fiber. Cereal fiber is found in whole grains and bran. Bran is the fiber-rich outer coat of cereal grain, which is largely removed in refining. In whole-grain cereals, the bran remains. In breakfast cereals, the largest amount of fiber is found in those with "bran" in their names. The fiber content is sometimes indicated on the label.   You may need to include additional fruits and vegetables each day.   In baking, for 1 cup white flour, you may use the following substitutions:   1 cup whole-wheat flour minus 2 tablespoons.   1/2 cup white flour plus 1/2 cup  whole-wheat flour.   Hemorrhoids Hemorrhoids are dilated (enlarged) veins around the rectum. Sometimes clots will form in the veins. This makes them swollen and painful. These are called thrombosed hemorrhoids. Causes of hemorrhoids include:  Constipation.   Straining to have a bowel movement.   HEAVY LIFTING  HOME CARE INSTRUCTIONS  Eat a well balanced diet and drink 6 to 8 glasses of water every day to avoid constipation. You may also use a bulk laxative.   Avoid straining to have bowel movements.   Keep anal area dry and clean.   Do not use a donut shaped pillow or sit on the toilet for long periods. This increases blood pooling and pain.   Move your bowels when your body has the urge; this will require less straining and will decrease pain and pressure.

## 2017-10-22 NOTE — Op Note (Signed)
[facility] Patient Name: Martha Montgomery Procedure Date: 10/22/2017 7:15 AM MRN: [id number] Date of Birth: [birth year] Attending MD: Barney Drain MD, MD CSN: [id number] Age: 37 Admit Type: Outpatient Procedure:                Colonoscopy, DIAGNOSTIC Indications:              Hematochezia Providers:                Barney Drain MD, MD, Hinton Rao, RN, Jeanann Lewandowsky.                            Sharon Seller, RN Referring MD:             Norwood Levo. Simpson MD, MD Medicines:                Propofol per Anesthesia Complications:            No immediate complications. Estimated Blood Loss:     Estimated blood loss: none. Procedure:                Pre-Anesthesia Assessment:                           - Prior to the procedure, a History and Physical                            was performed, and patient medications and                            allergies were reviewed. The patient's tolerance of                            previous anesthesia was also reviewed. The risks                            and benefits of the procedure and the sedation                            options and risks were discussed with the patient.                            All questions were answered, and informed consent                            was obtained. Prior Anticoagulants: The patient has                            taken no previous anticoagulant or antiplatelet                            agents. ASA Grade Assessment: II - A patient with                            mild systemic disease. After reviewing the risks  and benefits, the patient was deemed in                            satisfactory condition to undergo the procedure.                            After obtaining informed consent, the colonoscope                            was passed under direct vision. Throughout the                            procedure, the patient's blood pressure, pulse, and                            oxygen  saturations were monitored continuously. The                            EC-[id number] ([id number]) scope was introduced through                            the anus and advanced to the 5 cm into the ileum.                            The colonoscopy was technically difficult and                            complex due to significant looping. Successful                            completion of the procedure was aided by using                            manual pressure, straightening and shortening the                            scope to obtain bowel loop reduction and COLOWRAP.                            The patient tolerated the procedure well. The                            quality of the bowel preparation was excellent. The                            terminal ileum, ileocecal valve, appendiceal                            orifice, and rectum were photographed. Scope In: 7:48:11 AM Scope Out: 8:15:48 AM Scope Withdrawal Time: 0 hours 14 minutes 48 seconds  Total Procedure Duration: 0 hours 27 minutes 37 seconds  Findings:      The terminal ileum appeared normal.      The recto-sigmoid colon, sigmoid colon, descending colon and  splenic       flexure revealed significantly excessive looping.      The exam was otherwise without abnormality.      Internal hemorrhoids were found during retroflexion. The hemorrhoids       were small. Impression:               - The examined portion of the ileum was normal.                           - There was significant looping of the colon.                           - The examination was otherwise normal.                           - RECTAL BLEEDING DUE TO Internal hemorrhoids. Moderate Sedation:      Per Anesthesia Care Recommendation:           - Repeat colonoscopy in 10 years for surveillance.                           - High fiber diet and low fat diet. DRINK WATER.                            CONTINUE WEIGHT LOSS EFFORT.                           - Continue  present medications.                           - Return to my office in 4 months.                           - Patient has a contact number available for                            emergencies. The signs and symptoms of potential                            delayed complications were discussed with the                            patient. Return to normal activities tomorrow.                            Written discharge instructions were provided to the                            patient. Procedure Code(s):        --- Professional ---                           [phone], Colonoscopy, flexible; diagnostic, including                            collection of specimen(s) by brushing or  washing,                            when performed (separate procedure) Diagnosis Code(s):        --- Professional ---                           K64.8, Other hemorrhoids                           K92.1, Melena (includes Hematochezia) CPT copyright 2016 [employer]. All rights reserved. The codes documented in this report are preliminary and upon coder review may  be revised to meet current compliance requirements. Barney Drain, MD Barney Drain MD, MD 10/22/2017 8:27:59 AM This report has been signed electronically. Number of Addenda: 0

## 2017-10-22 NOTE — Transfer of Care (Signed)
Immediate Anesthesia Transfer of Care Note  Patient: Martha Montgomery  Procedure(s) Performed: COLONOSCOPY WITH PROPOFOL (N/A )  Patient Location: PACU  Anesthesia Type:MAC  Level of Consciousness: awake and patient cooperative  Airway & Oxygen Therapy: Patient Spontanous Breathing and non-rebreather face mask  Post-op Assessment: Report given to RN and Post -op Vital signs reviewed and stable  Post vital signs: Reviewed and stable  Last Vitals:  Vitals Value Taken Time  BP    Temp    Pulse 59 10/22/2017  8:22 AM  Resp    SpO2 79 % 10/22/2017  8:22 AM  Vitals shown include unvalidated device data.  Last Pain:  Vitals:   10/22/17 0736  TempSrc:   PainSc: 0-No pain      Patients Stated Pain Goal: 5 (62/83/66 [id number])  Complications: No apparent anesthesia complications

## 2017-10-22 NOTE — Anesthesia Preprocedure Evaluation (Signed)
Anesthesia Evaluation  Patient identified by MRN, date of birth, ID band Patient awake    Reviewed: Allergy & Precautions, H&P , NPO status , Patient's Chart, lab work & pertinent test results, reviewed documented beta blocker date and time   Airway Mallampati: I  TM Distance: >3 FB Neck ROM: Full    Dental no notable dental hx. (+) Teeth Intact   Pulmonary former smoker,    Pulmonary exam normal        Cardiovascular hypertension, Pt. on medications and Pt. on home beta blockers  Rhythm:Regular Rate:Normal     Neuro/Psych  Headaches, PSYCHIATRIC DISORDERS Anxiety Depression Spinal stenosis    GI/Hepatic Neg liver ROS, GERD  Medicated and Controlled,  Endo/Other  negative endocrine ROSHypothyroidism   Renal/GU negative Renal ROS     Musculoskeletal negative musculoskeletal ROS (+) Arthritis , Fibromyalgia -  Abdominal Normal abdominal exam  (+)   Peds  Hematology negative hematology ROS (+)   Anesthesia Other Findings   Reproductive/Obstetrics                             Anesthesia Physical Anesthesia Plan  ASA: III  Anesthesia Plan: MAC   Post-op Pain Management:    Induction: Intravenous  PONV Risk Score and Plan:   Airway Management Planned: Simple Face Mask  Additional Equipment:   Intra-op Plan:   Post-operative Plan:   Informed Consent: I have reviewed the patients History and Physical, chart, labs and discussed the procedure including the risks, benefits and alternatives for the proposed anesthesia with the patient or authorized representative who has indicated his/her understanding and acceptance.     Plan Discussed with:   Anesthesia Plan Comments:         Anesthesia Quick Evaluation  

## 2017-10-22 NOTE — Anesthesia Procedure Notes (Signed)
Procedure Name: MAC Date/Time: 10/22/2017 7:34 AM Performed by: Vista Deck, CRNA Pre-anesthesia Checklist: Patient identified, Emergency Drugs available, Suction available, Timeout performed and Patient being monitored Patient Re-evaluated:Patient Re-evaluated prior to induction Oxygen Delivery Method: Non-rebreather mask

## 2017-10-22 NOTE — Anesthesia Postprocedure Evaluation (Signed)
Anesthesia Post Note  Patient: Martha Montgomery  Procedure(s) Performed: COLONOSCOPY WITH PROPOFOL (N/A )  Patient location during evaluation: PACU Anesthesia Type: MAC Level of consciousness: awake and alert and patient cooperative Pain management: satisfactory to patient Vital Signs Assessment: post-procedure vital signs reviewed and stable Respiratory status: spontaneous breathing Cardiovascular status: stable Postop Assessment: no apparent nausea or vomiting Anesthetic complications: no     Last Vitals:  Vitals:   10/22/17 0825 10/22/17 0830  BP: 121/80   Pulse: 87   Resp: 19   Temp: 36.7 C   SpO2: 100% 98%    Last Pain:  Vitals:   10/22/17 0825  TempSrc:   PainSc: 0-No pain                 Trulee Hamstra

## 2017-10-22 NOTE — Telephone Encounter (Signed)
10/22/17  pt cx she said she had a procedure done and couldn't come today

## 2017-10-26 ENCOUNTER — Ambulatory Visit (INDEPENDENT_AMBULATORY_CARE_PROVIDER_SITE_OTHER): Payer: Medicare Other

## 2017-10-26 DIAGNOSIS — Z3040 Encounter for surveillance of contraceptives, unspecified: Secondary | ICD-10-CM | POA: Diagnosis not present

## 2017-10-26 MED ORDER — MEDROXYPROGESTERONE ACETATE 150 MG/ML IM SUSP
150.0000 mg | Freq: Once | INTRAMUSCULAR | Status: AC
  Administered 2017-10-26: 150 mg via INTRAMUSCULAR

## 2017-10-26 NOTE — Progress Notes (Signed)
Injection given with no complications 

## 2017-10-26 NOTE — Patient Instructions (Signed)
Patient aware to come back June 17-July 1.

## 2017-10-27 ENCOUNTER — Ambulatory Visit (HOSPITAL_COMMUNITY): Payer: Medicare Other | Attending: Orthopedic Surgery | Admitting: Physical Therapy

## 2017-10-27 ENCOUNTER — Encounter (HOSPITAL_COMMUNITY): Payer: Self-pay | Admitting: Gastroenterology

## 2017-10-27 DIAGNOSIS — G8929 Other chronic pain: Secondary | ICD-10-CM | POA: Diagnosis not present

## 2017-10-27 DIAGNOSIS — M25562 Pain in left knee: Secondary | ICD-10-CM | POA: Insufficient documentation

## 2017-10-27 DIAGNOSIS — R262 Difficulty in walking, not elsewhere classified: Secondary | ICD-10-CM | POA: Diagnosis not present

## 2017-10-27 DIAGNOSIS — M6281 Muscle weakness (generalized): Secondary | ICD-10-CM

## 2017-10-27 DIAGNOSIS — M25561 Pain in right knee: Secondary | ICD-10-CM | POA: Diagnosis not present

## 2017-10-27 NOTE — Therapy (Signed)
[employer] [place], [place], [address] Phone: [phone]   Fax:  [phone]  Physical Therapy Treatment/Discharge   Patient Details  Name: Martha Montgomery MRN: [id number] Date of Birth: 05/27/1981 Referring Provider: Arther Abbott    Encounter Date: 10/27/2017  PT End of Session - 10/27/17 1551    Visit Number  7    Number of Visits  7    Date for PT Re-Evaluation  10/24/17    Authorization Type  [employer] medicare    Authorization - Visit Number  7    Authorization - Number of Visits  7    PT Start Time  2951    PT Stop Time  1547    PT Time Calculation (min)  24 min    Activity Tolerance  Patient tolerated treatment well    Behavior During Therapy  [facility] for tasks assessed/performed       Past Medical History:  Diagnosis Date  . Anxiety   . Anxiety disorder   . Arthritis    "knees, joints, hands, toes" (09/28/2013)  . Depression   . Fibromyalgia   . Gastritis nov. 2011   EGD by Dr. Oneida Alar, negative h pylori  . GERD (gastroesophageal reflux disease)   . Gout   . Hypertension x 6 years   . Hypothyroidism   . Migraines    "15/month average" (09/28/2013)  . Spinal stenosis   . Thyroid goiter     Past Surgical History:  Procedure Laterality Date  . CESAREAN SECTION  2008  . CHONDROPLASTY  04/02/2012   Procedure: CHONDROPLASTY;  Surgeon: Carole Civil, MD;  Location: [facility];  Service: Orthopedics;  Laterality: Left;  of left patella  . COLONOSCOPY  May 2012   Dr. Oneida Alar: normal colon, small internal hemorrhoids  . COLONOSCOPY WITH PROPOFOL N/A 10/22/2017   Procedure: COLONOSCOPY WITH PROPOFOL;  Surgeon: Danie Binder, MD;  Location: [facility];  Service: Endoscopy;  Laterality: N/A;  7:30am  . INCISION AND DRAINAGE ABSCESS  1997; 2005   "under right buttocks; under left arm"  . THYROIDECTOMY N/A 09/28/2013   Procedure: TOTAL THYROIDECTOMY;  Surgeon: Ascencion Dike, MD;  Location: [facility];  Service: ENT;   Laterality: N/A;  . TOTAL THYROIDECTOMY  09/27/2013  . UPPER GI ENDOSCOPY  2011   EGD: gastritis, no h.pylori    There were no vitals filed for this visit.  Subjective Assessment - 10/27/17 1523    Subjective  Pt has not been to therapy since 3/21 due to GI procedure.  Pt states that she is doing her exercises.     How long can you sit comfortably?  15 minutes was 15    How long can you stand comfortably?  40 minutes was 10     How long can you walk comfortably?  12 minutes was 10     Currently in Pain?  Yes    Pain Score  6     Pain Location  Knee    Pain Orientation  Right;Left    Pain Descriptors / Indicators  Aching    Pain Type  Chronic pain    Pain Onset  More than a month ago    Pain Frequency  Constant    Aggravating Factors   walking    Pain Relieving Factors  topical cream    Effect of Pain on Daily Activities  limits         [facility] PT Assessment - 10/27/17  0001      Assessment   Medical Diagnosis  B knee pain    Referring Provider  Arther Abbott     Onset Date/Surgical Date  08/31/17    Next MD Visit  nothing scheduled     Prior Therapy  twice before last time 2014.       Precautions   Precautions  None      Restrictions   Weight Bearing Restrictions  No      Balance Screen   Has the patient fallen in the past 6 months  Yes    How many times?  1    Has the patient had a decrease in activity level because of a fear of falling?   Yes    Is the patient reluctant to leave their home because of a fear of falling?   No      Prior Function   Level of Independence  Independent    Vocation  On disability    Leisure  walking, easier to come sit to stand.       Cognition   Overall Cognitive Status  Within Functional Limits for tasks assessed      Observation/Other Assessments   Observations  ambulates flat footed B     Focus on Therapeutic Outcomes (FOTO)   36 was 28      Functional Tests   Functional tests  Single leg stance;Sit to Stand      Single  Leg Stance   Comments  LT :17 was 5;  RT24 was  10      Sit to Stand   Comments  5 x 10.98 was 21.43       AROM   Right Knee Extension  0    Right Knee Flexion  136 was 120     Left Knee Extension  0    Left Knee Flexion  136 was 120      Strength   Right Hip Flexion  5/5 was 4+/5    Right Hip Extension  5/5 was 3/5     Right Hip ABduction  5/5 was 4/5     Left Hip Flexion  5/5 was 4+    Left Hip Extension  5/5 was 3/5     Left Hip ABduction  5/5    Right Knee Flexion  5/5 was 4/5     Right Knee Extension  5/5 was 4+/5    Left Knee Flexion  5/5 was 4/5    Left Knee Extension  5/5 was 4+/5      Special Tests   Other special tests  --                   [facility] Adult PT Treatment/Exercise - 10/27/17 0001      Knee/Hip Exercises: Standing   Functional Squat  10 reps    SLS  x 4 each leg       Knee/Hip Exercises: Seated   Sit to Sand  10 reps               PT Short Term Goals - 10/27/17 1542      PT SHORT TERM GOAL #1   Title  PT pain to be no greater than a 5/10 to allow pt to walk for 15 minutes without increased pain .    Time  2    Period  Weeks    Status  On-going      PT SHORT TERM GOAL #2  Title  Pt to be using heel to gait mechanics to improve knee pain.     Time  2    Period  Weeks    Status  Achieved      PT SHORT TERM GOAL #3   Title  PT to be comfortable sitting for an hour to travel in the car    Baseline  able to drive to [facility] but has some discomfort     Time  2    Period  Weeks    Status  Partially Met        PT Long Term Goals - 10/27/17 1543      PT LONG TERM GOAL #1   Title  Pt pain to be no greater than a 3/10 to allow pt to walk for 30 minutes to return to walking for exercise.     Time  4    Period  Weeks    Status  On-going      PT LONG TERM GOAL #2   Title  PT LE strength B to be improved one grade to allow pt to come sit to stand with no difficulty     Time  4    Period  Weeks    Status  Achieved       PT LONG TERM GOAL #3   Title  PT to have improved flexibility in her hips and knees to be able to don and doff her shoes and socks without difficulty.    Baseline  better but still some difficulty     Time  4    Period  Weeks    Status  Partially Met            Plan - 10/27/17 1552    Clinical Impression Statement  Pt has not been seen since 3/21 due to having to have procedures completed but has been completing her HEP. Pt reassessed with noted improvement in all areas .  Therapist urged pt to progress her walking and to continue with single leg stance activity.  At this time ROM and strength are functional and pt is happy with her functional ability therefore we will discharge.     Rehab Potential  Good    PT Frequency  2x / week    PT Duration  4 weeks    PT Treatment/Interventions  Gait training;Stair training;Functional mobility training;Therapeutic activities;Therapeutic exercise;Balance training;Neuromuscular re-education;Patient/family education    PT Next Visit Plan  Discharge pt to HEP    PT Home Exercise Plan  hip flexion stretch, heel raises, SLS, SLR with ER, and hip abduction, hip extension; 09/30/17: inferior patella glides       Patient will benefit from skilled therapeutic intervention in order to improve the following deficits and impairments:  Abnormal gait, Decreased activity tolerance, Decreased balance, Decreased strength, Difficulty walking, Impaired flexibility, Pain, Obesity  Visit Diagnosis: Chronic pain of right knee  Chronic pain of left knee  Muscle weakness (generalized)  Difficulty in walking, not elsewhere classified     Problem List Patient Active Problem List   Diagnosis Date Noted  . Esophageal dysphagia 09/09/2017  . Bloating 03/05/2017  . Bilateral knee pain 01/05/2017  . Amenorrhea 01/05/2017  . Bilateral hip pain 07/22/2016  . Morbid obesity ([place]) 06/30/2015  . Post-surgical hypothyroidism 06/02/2014  . HTN (hypertension),  benign 06/30/2012  . Knee pain 06/02/2012  . Patellar malalignment syndrome 03/09/2012  . IBS (irritable bowel syndrome) 08/29/2011  . GERD (gastroesophageal reflux disease) 08/29/2011  .  Rectal bleeding 12/10/2010  . BACK PAIN 09/24/2010  . Migraine with aura 04/02/2010  . Constipation 09/12/2009  . Allergic rhinitis 06/16/2009  . Anxiety and depression 10/18/2008    Rayetta Humphrey, PT CLT [phone] 10/27/2017, 3:55 PM  [facility] [address] [place], [place], [address] Phone: [phone]   Fax:  [phone]  Name: Martha Montgomery MRN: [id number] Date of Birth: [birth year]   PHYSICAL THERAPY DISCHARGE SUMMARY  Visits from Start of Care: 7  Current functional level related to goals / functional outcomes: See above   Remaining deficits: Pain    Education / Equipment: HEP Plan: Patient agrees to discharge.  Patient goals were partially met. Patient is being discharged due to being pleased with the current functional level.  ?????        Rayetta Humphrey, [place] CLT [phone]

## 2017-10-27 NOTE — Addendum Note (Signed)
Addended by: Leeroy Cha on: 10/27/2017 04:22 PM   Modules accepted: Orders

## 2017-10-29 ENCOUNTER — Encounter (HOSPITAL_COMMUNITY): Payer: Self-pay | Admitting: Physical Therapy

## 2017-11-03 ENCOUNTER — Encounter (HOSPITAL_COMMUNITY): Payer: Self-pay | Admitting: Physical Therapy

## 2017-11-04 DIAGNOSIS — M25569 Pain in unspecified knee: Secondary | ICD-10-CM | POA: Diagnosis not present

## 2017-11-04 DIAGNOSIS — M130 Polyarthritis, unspecified: Secondary | ICD-10-CM | POA: Diagnosis not present

## 2017-11-04 DIAGNOSIS — M797 Fibromyalgia: Secondary | ICD-10-CM | POA: Diagnosis not present

## 2017-11-05 ENCOUNTER — Encounter (HOSPITAL_COMMUNITY): Payer: Self-pay

## 2017-11-11 ENCOUNTER — Encounter: Payer: Self-pay | Admitting: Family Medicine

## 2017-11-11 ENCOUNTER — Ambulatory Visit (INDEPENDENT_AMBULATORY_CARE_PROVIDER_SITE_OTHER): Payer: Medicare Other | Admitting: Family Medicine

## 2017-11-11 VITALS — BP 114/80 | HR 77 | Resp 16 | Ht 63.0 in | Wt 207.0 lb

## 2017-11-11 DIAGNOSIS — I10 Essential (primary) hypertension: Secondary | ICD-10-CM | POA: Diagnosis not present

## 2017-11-11 DIAGNOSIS — E559 Vitamin D deficiency, unspecified: Secondary | ICD-10-CM

## 2017-11-11 DIAGNOSIS — F419 Anxiety disorder, unspecified: Secondary | ICD-10-CM

## 2017-11-11 DIAGNOSIS — M25562 Pain in left knee: Secondary | ICD-10-CM | POA: Diagnosis not present

## 2017-11-11 DIAGNOSIS — G8929 Other chronic pain: Secondary | ICD-10-CM | POA: Diagnosis not present

## 2017-11-11 DIAGNOSIS — Z13220 Encounter for screening for lipoid disorders: Secondary | ICD-10-CM | POA: Diagnosis not present

## 2017-11-11 DIAGNOSIS — R7301 Impaired fasting glucose: Secondary | ICD-10-CM

## 2017-11-11 DIAGNOSIS — E890 Postprocedural hypothyroidism: Secondary | ICD-10-CM | POA: Diagnosis not present

## 2017-11-11 DIAGNOSIS — F329 Major depressive disorder, single episode, unspecified: Secondary | ICD-10-CM

## 2017-11-11 DIAGNOSIS — R7989 Other specified abnormal findings of blood chemistry: Secondary | ICD-10-CM | POA: Diagnosis not present

## 2017-11-11 MED ORDER — AMLODIPINE BESYLATE 2.5 MG PO TABS: 2.5000 mg | ORAL_TABLET | Freq: Every day | ORAL | 3 refills | Status: DC

## 2017-11-11 MED ORDER — HYDROCHLOROTHIAZIDE 12.5 MG PO CAPS
12.5000 mg | ORAL_CAPSULE | Freq: Every day | ORAL | 3 refills | Status: DC
Start: 2017-11-11 — End: 2018-08-12

## 2017-11-11 NOTE — Patient Instructions (Addendum)
F/u in 4 months, call if you need me before  Fasting lipid, cmp and EGFR, tSH, HBA1C vit D and cBC in am  Congrats on weight loss, goal of 8 to 10 pounds in next 4 months, will help knees and overall health  You are beiong referred to ortho of your choce  Blood pressure is good  You are referred for tele psycho therapy, Ava will call  It is important that you exercise regularly at least 30 minutes 5 times a week. If you develop chest pain, have severe difficulty breathing, or feel very tired, stop exercising immediately and seek medical attention  Thank you  for choosing [facility]. We consider it a privelige to serve you.  Delivering excellent health care in a caring and  compassionate way is our goal.  Partnering with you,  so that together we can achieve this goal is our strategy.

## 2017-11-11 NOTE — Assessment & Plan Note (Signed)
Bilateral knee pain, requests referral to Dr Rochele Pages in [place] requested  For eval and management

## 2017-11-18 DIAGNOSIS — R7301 Impaired fasting glucose: Secondary | ICD-10-CM | POA: Diagnosis not present

## 2017-11-18 DIAGNOSIS — I10 Essential (primary) hypertension: Secondary | ICD-10-CM | POA: Diagnosis not present

## 2017-11-18 DIAGNOSIS — E559 Vitamin D deficiency, unspecified: Secondary | ICD-10-CM | POA: Diagnosis not present

## 2017-11-18 DIAGNOSIS — Z13220 Encounter for screening for lipoid disorders: Secondary | ICD-10-CM | POA: Diagnosis not present

## 2017-11-18 LAB — CBC
HCT: 36.2 % (ref 35.0–45.0)
Hemoglobin: 12.7 g/dL (ref 11.7–15.5)
MCH: 30.5 pg (ref 27.0–33.0)
MCHC: 35.1 g/dL (ref 32.0–36.0)
MCV: 86.8 fL (ref 80.0–100.0)
MPV: 10.6 fL (ref 7.5–12.5)
PLATELETS: 405 10*3/uL — AB (ref 140–400)
RBC: 4.17 10*6/uL (ref 3.80–5.10)
RDW: 13.3 % (ref 11.0–15.0)
WBC: 5 10*3/uL (ref 3.8–10.8)

## 2017-11-19 ENCOUNTER — Encounter: Payer: Self-pay | Admitting: Family Medicine

## 2017-11-19 LAB — COMPLETE METABOLIC PANEL WITH GFR
AG RATIO: 1.5 (calc) (ref 1.0–2.5)
ALKALINE PHOSPHATASE (APISO): 68 U/L (ref 33–115)
ALT: 9 U/L (ref 6–29)
AST: 16 U/L (ref 10–30)
Albumin: 4.3 g/dL (ref 3.6–5.1)
BILIRUBIN TOTAL: 0.5 mg/dL (ref 0.2–1.2)
BUN: 10 mg/dL (ref 7–25)
CO2: 30 mmol/L (ref 20–32)
Calcium: 9.4 mg/dL (ref 8.6–10.2)
Chloride: 103 mmol/L (ref 98–110)
Creat: 0.8 mg/dL (ref 0.50–1.10)
GFR, Est African American: 111 mL/min/{1.73_m2} (ref >=60)
GFR, Est Non African American: 96 mL/min/{1.73_m2} (ref >=60)
GLOBULIN: 2.8 g/dL (ref 1.9–3.7)
Glucose, Bld: 97 mg/dL (ref 65–99)
POTASSIUM: 3.4 mmol/L — AB (ref 3.5–5.3)
SODIUM: 142 mmol/L (ref 135–146)
Total Protein: 7.1 g/dL (ref 6.1–8.1)

## 2017-11-19 LAB — LIPID PANEL
CHOL/HDL RATIO: 4.3 (calc) (ref <5.0)
CHOLESTEROL: 194 mg/dL (ref <200)
HDL: 45 mg/dL — AB (ref >50)
LDL CHOLESTEROL (CALC): 129 mg/dL — AB
Non-HDL Cholesterol (Calc): 149 mg/dL (calc) — ABNORMAL HIGH (ref <130)
TRIGLYCERIDES: 98 mg/dL (ref <150)

## 2017-11-19 LAB — HEMOGLOBIN A1C
HEMOGLOBIN A1C: 5.7 %{Hb} — AB (ref <5.7)
MEAN PLASMA GLUCOSE: 117 (calc)
eAG (mmol/L): 6.5 (calc)

## 2017-11-19 LAB — VITAMIN D 25 HYDROXY (VIT D DEFICIENCY, FRACTURES): Vit D, 25-Hydroxy: 11 ng/mL — ABNORMAL LOW (ref 30–100)

## 2017-11-19 LAB — TSH: TSH: 0.31 mIU/L — ABNORMAL LOW

## 2017-11-30 ENCOUNTER — Telehealth: Payer: Self-pay | Admitting: Clinical

## 2017-11-30 ENCOUNTER — Encounter: Payer: Self-pay | Admitting: Family Medicine

## 2017-11-30 ENCOUNTER — Encounter (INDEPENDENT_AMBULATORY_CARE_PROVIDER_SITE_OTHER): Payer: Self-pay

## 2017-11-30 DIAGNOSIS — F329 Major depressive disorder, single episode, unspecified: Secondary | ICD-10-CM

## 2017-11-30 DIAGNOSIS — F32A Depression, unspecified: Secondary | ICD-10-CM

## 2017-11-30 DIAGNOSIS — R7989 Other specified abnormal findings of blood chemistry: Secondary | ICD-10-CM | POA: Insufficient documentation

## 2017-11-30 NOTE — Assessment & Plan Note (Signed)
Improved Patient re-educated about  the importance of commitment to a  minimum of 150 minutes of exercise per week.  The importance of healthy food choices with portion control discussed. Encouraged to start a food diary, count calories and to consider  joining a support group. Sample diet sheets offered. Goals set by the patient for the next several months.   Weight /BMI 11/11/2017 10/13/2017 09/09/2017  WEIGHT 207 lb 217 lb 212 lb  HEIGHT 5\' 3"  5\' 3"  5\' 3"   BMI 36.67 kg/m2 38.44 kg/m2 37.55 kg/m2

## 2017-11-30 NOTE — Assessment & Plan Note (Addendum)
Markedly improved and pt on no medication and interested in telepsych, has a h/o depression and multiple personal and family stressors, will benefit from psychotherapy she has had this in the past

## 2017-11-30 NOTE — Telephone Encounter (Signed)
This [facility] specialist left message to call back with name and contact information. 

## 2017-11-30 NOTE — Assessment & Plan Note (Signed)
Controlled, no change in medication DASH diet and commitment to daily physical activity for a minimum of 30 minutes discussed and encouraged, as a part of hypertension management. The importance of attaining a healthy weight is also discussed.  BP/Weight 11/11/2017 10/22/2017 10/20/2017 10/13/2017 09/09/2017 03/30/8100 01/30/1024  Systolic BP 852 778 242 353 614 431 540  Diastolic BP 80 78 88 90 87 94 98  Wt. (Lbs) 207 - - 217 212 208 208  BMI 36.67 - - 38.44 37.55 36.85 36.85

## 2017-11-30 NOTE — Assessment & Plan Note (Signed)
Over corrected, reduce synthroid dose needed

## 2017-11-30 NOTE — Progress Notes (Signed)
Martha Montgomery     MRN: [id number]      DOB: May 20, 1981   HPI Martha Montgomery is here for follow up and re-evaluation of chronic medical conditions, medication management and review of any available recent lab and radiology data.  Preventive health is updated, specifically  Cancer screening and Immunization.   Questions or concerns regarding consultations or procedures which the PT has had in the interim are  addressed. The PT denies any adverse reactions to current medications since the last visit.  C/o increased and un controled knee pain and wants referral to a specific Ortho in  [facility] for treatment involving intra articular therapy Working on food choice to facilitate weight loss ROS Denies recent fever or chills. Denies sinus pressure, nasal congestion, ear pain or sore throat. Denies chest congestion, productive cough or wheezing. Denies chest pains, palpitations and leg swelling Denies abdominal pain, nausea, vomiting,diarrhea or constipation.   Denies dysuria, frequency, hesitancy or incontinence.  Denies uncontrolled  headaches, seizures, numbness, or tingling. Denies depression, anxiety or insomnia. Denies skin break down or rash.   PE  BP 114/80   Pulse 77   Resp 16   Ht 5\' 3"  (1.6 m)   Wt 207 lb (93.9 kg)   SpO2 98%   BMI 36.67 kg/m   Patient alert and oriented and in no cardiopulmonary distress.  HEENT: No facial asymmetry, EOMI,   oropharynx pink and moist.  Neck supple no JVD, no mass.  Chest: Clear to auscultation bilaterally.  CVS: S1, S2 no murmurs, no S3.Regular rate.  ABD: Soft non tender.   Ext: No edema  MS: Adequate ROM spine, shoulders, hips and educed in  knees.  Skin: Intact, no ulcerations or rash noted.  Psych: Good eye contact, normal affect. Memory intact not anxious or depressed appearing.  CNS: CN 2-12 intact, power,  normal throughout.no focal deficits noted.   Assessment & Plan  Knee pain Bilateral knee pain, requests  referral to Dr Rochele Pages in [place] requested  For eval and management  HTN (hypertension), benign Controlled, no change in medication DASH diet and commitment to daily physical activity for a minimum of 30 minutes discussed and encouraged, as a part of hypertension management. The importance of attaining a healthy weight is also discussed.  BP/Weight 11/11/2017 10/22/2017 10/20/2017 10/13/2017 09/09/2017 09/27/9922 09/03/8339  Systolic BP 962 229 798 921 194 174 081  Diastolic BP 80 78 88 90 87 94 98  Wt. (Lbs) 207 - - 217 212 208 208  BMI 36.67 - - 38.44 37.55 36.85 36.85       Anxiety and depression Markedly improved and pt on no medication and interested in telepsych, has a h/o depression and multiple personal and family stressors, will benefit from psychotherapy she has had this in the past  Morbid obesity ([place]) Improved Patient re-educated about  the importance of commitment to a  minimum of 150 minutes of exercise per week.  The importance of healthy food choices with portion control discussed. Encouraged to start a food diary, count calories and to consider  joining a support group. Sample diet sheets offered. Goals set by the patient for the next several months.   Weight /BMI 11/11/2017 10/13/2017 09/09/2017  WEIGHT 207 lb 217 lb 212 lb  HEIGHT 5\' 3"  5\' 3"  5\' 3"   BMI 36.67 kg/m2 38.44 kg/m2 37.55 kg/m2      Post-surgical hypothyroidism Over corrected, reduce synthroid dose needed  Low vitamin D level Supplement needed

## 2017-11-30 NOTE — Assessment & Plan Note (Signed)
Supplement needed 

## 2017-11-30 NOTE — BH Specialist Note (Signed)
[facility] Initial Clinical Assessment  MRN: [id number] NAME: Martha Montgomery Date: 11/30/17  Start time: 10:55am  End time: 11:40 am Total time: 45 minutes  Type of Contact: Type of Contact: Phone Call Initial Contact Patient consent obtained: Patient consent obtained for Virtual Visit: Yes Reason for Visit today: Reason for Your Call/Visit Today: Depression - Wants [facility] services to express problems in a healthy way and to obtain professional feedback  Treatment History Patient recently received Inpatient Treatment: Have You Recently Been in Any Inpatient Treatment (Hospital/Detox/Crisis Center/28-Day Program)?: No Patient currently being seen by therapist/psychiatrist: Do You Currently Have a Therapist/Psychiatrist?: No Patient currently receiving the following services: Patient Currently Receiving the Following Services:: Currently not receiving any services  Past Psychiatric History/Hospitalization(s): No Anxiety: Minimal Bipolar Disorder: No Depression: Yes Mania: No Psychosis: No Schizophrenia: No Personality Disorder: No Hospitalization for psychiatric illness: No History of Electroconvulsive Shock Therapy: No Prior Suicide Attempts: No  Clinical Assessment:  PHQ-9 Assessments: Depression screen [facility] 2/9 11/11/2017 11/11/2017 06/22/2017  Decreased Interest 1 0 0  Down, Depressed, Hopeless 0 0 0  PHQ - 2 Score 1 0 0  Altered sleeping 1 - -  Tired, decreased energy 1 - -  Change in appetite 2 - -  Feeling bad or failure about yourself  0 - -  Trouble concentrating 1 - -  Moving slowly or fidgety/restless 1 - -  Suicidal thoughts 0 - -  PHQ-9 Score 7 - -  Difficult doing work/chores Somewhat difficult - -    GAD-7 Assessments: GAD-7 Generalized Anxiety Disorder Screening Tool 11/30/2017  1. Feeling Nervous, Anxious, or on Edge 0  2. Not Being Able to Stop or Control Worrying 2  3. Worrying Too Much About Different Things 1  4. Trouble Relaxing 1  5. Being  So Restless it's Hard To Sit Still 0  6. Becoming Easily Annoyed or Irritable 1  7. Feeling Afraid As If Something Awful Might Happen 0  Total GAD-7 Score 5  Difficulty At Work, Home, or Getting  Along With Others? Not difficult at all    Social Functioning Social maturity: Social Maturity: Responsible Social judgement: Social Judgement: Normal  Stress Current stressors: Current Stressors: Publishing rights manager, Separation(On disability, separated from spouse for about 4 years, difficulties with managing 103 yo son's behaviors & also supporting mother) Familial stressors: Familial Stressors: None Sleep: Sleep: Increased(Thinks it's due to medications she's on) Appetite: Appetite: Decreased Coping ability: Coping ability: Normal Patient taking medications as prescribed: Patient taking medications as prescribed: Yes  Current medications:  Outpatient Encounter Medications as of 11/30/2017  Medication Sig  . amLODipine (NORVASC) 2.5 MG tablet Take 1 tablet (2.5 mg total) by mouth daily.  . buprenorphine (BUTRANS) 10 MCG/HR PTWK patch Place 10 mcg onto the skin once a week.  . cyclobenzaprine (FLEXERIL) 10 MG tablet Take 10 mg by mouth daily as needed for muscle spasms.  . diphenhydrAMINE (BENADRYL) 25 MG tablet Take 25 mg by mouth daily.  . fluticasone (FLONASE) 50 MCG/ACT nasal spray Place 1 spray into both nostrils daily as needed for allergies or rhinitis.  . hydrochlorothiazide (MICROZIDE) 12.5 MG capsule Take 1 capsule (12.5 mg total) by mouth daily.  Marland Kitchen HYDROcodone-acetaminophen (NORCO) 7.5-325 MG tablet Take 1 tablet by mouth 2 (two) times daily as needed for moderate pain.   Marland Kitchen levothyroxine (SYNTHROID) 200 MCG tablet Take 1 tablet (200 mcg total) by mouth daily before breakfast.  . lubiprostone (AMITIZA) 24 MCG capsule Take 1 capsule (24 mcg total)  by mouth 2 (two) times daily with a meal.  . medroxyPROGESTERone (DEPO-PROVERA) 150 MG/ML injection Inject 150 mg into the muscle every 3 (three) months.   . metoprolol tartrate (LOPRESSOR) 50 MG tablet TAKE ONE TABLET BY MOUTH TWICE DAILY  . OVER THE COUNTER MEDICATION Take 5 capsules by mouth daily. Kratom otc supplement  . pantoprazole (PROTONIX) 40 MG tablet Take 1 tablet (40 mg total) by mouth daily. Take 30 minutes before breakfast (Patient taking differently: Take 40 mg by mouth daily as needed (pain). Take 30 minutes before breakfast)  . potassium chloride (K-DUR) 10 MEQ tablet Take 1 tablet (10 mEq total) by mouth daily.  . pregabalin (LYRICA) 200 MG capsule Take 200 mg by mouth 3 (three) times daily.   No facility-administered encounter medications on file as of 11/30/2017.     Self-harm Behaviors Risk Assessment Self-harm risk factors: Self-harm risk factors: Chronic pain(Typically has average pain of 6 or 7, 10 the worst) Patient endorses recent thoughts of harming self: Have you recently had any thoughts about harming yourself?: No  [place] Suicide Severity Rating Scale: No flowsheet data found.  Danger to Others Risk Assessment Danger to others risk factors: Danger to Others Risk Factors: No risk factors noted Patient endorses recent thoughts of harming others: Notification required: No need or identified person  Dynamic Appraisal of Situational Aggression (DASA): No flowsheet data found.  Substance Use Assessment Patient recently consumed alcohol: No current use of alcohol  Alcohol Use Disorder Identification Test (AUDIT): No flowsheet data found. Patient recently used drugs: No current use of drugs  Opioid Risk Assessment:  Patient is concerned about dependence or abuse of substances:  No reported concerns   Goals, Interventions and Follow-up Plan Goals: Increase ability to cope and express her thoughts & feelings in healthy ways Interventions: Supportive Counseling and Psychoeducation and/or Health Education  Summary of Clinical Assessment Summary:   Martha Montgomery is a 37 yo female with chronic medical conditions and  chronic pain.  Martha Montgomery has a history of trauma and depression. She also experiences chronic pain that affects her sleep. Martha Montgomery is currently on disability for her medical and mental health conditions.  Martha Montgomery lives with her mother and 63 yo son.  Martha Montgomery would like to express and cope in healthy ways, instead of holding back her thoughts and feelings.  Martha Montgomery uses art, music and her faith to cope with current stressors.   Follow-up Plan:  Martha Montgomery plans to spend time with her cousin this week that she enjoys talking to. Continue with [employer] - patient not interested in medication management at this time    Toney Rakes, LCSW

## 2017-12-01 ENCOUNTER — Encounter: Payer: Self-pay | Admitting: Clinical

## 2017-12-01 NOTE — BH Specialist Note (Signed)
A user error has taken place: encounter opened in error, closed for administrative reasons.

## 2017-12-02 DIAGNOSIS — M545 Low back pain: Secondary | ICD-10-CM | POA: Diagnosis not present

## 2017-12-02 DIAGNOSIS — M130 Polyarthritis, unspecified: Secondary | ICD-10-CM | POA: Diagnosis not present

## 2017-12-02 DIAGNOSIS — M25569 Pain in unspecified knee: Secondary | ICD-10-CM | POA: Diagnosis not present

## 2017-12-02 DIAGNOSIS — Z79891 Long term (current) use of opiate analgesic: Secondary | ICD-10-CM | POA: Diagnosis not present

## 2017-12-02 DIAGNOSIS — M797 Fibromyalgia: Secondary | ICD-10-CM | POA: Diagnosis not present

## 2017-12-25 DIAGNOSIS — M1712 Unilateral primary osteoarthritis, left knee: Secondary | ICD-10-CM | POA: Diagnosis not present

## 2017-12-25 DIAGNOSIS — M47816 Spondylosis without myelopathy or radiculopathy, lumbar region: Secondary | ICD-10-CM | POA: Diagnosis not present

## 2017-12-25 DIAGNOSIS — M25562 Pain in left knee: Secondary | ICD-10-CM | POA: Diagnosis not present

## 2017-12-25 DIAGNOSIS — G894 Chronic pain syndrome: Secondary | ICD-10-CM | POA: Diagnosis not present

## 2017-12-25 DIAGNOSIS — Z79891 Long term (current) use of opiate analgesic: Secondary | ICD-10-CM | POA: Diagnosis not present

## 2017-12-25 DIAGNOSIS — G8929 Other chronic pain: Secondary | ICD-10-CM | POA: Diagnosis not present

## 2017-12-25 DIAGNOSIS — M170 Bilateral primary osteoarthritis of knee: Secondary | ICD-10-CM | POA: Diagnosis not present

## 2017-12-25 DIAGNOSIS — M21062 Valgus deformity, not elsewhere classified, left knee: Secondary | ICD-10-CM | POA: Diagnosis not present

## 2017-12-30 ENCOUNTER — Emergency Department (HOSPITAL_COMMUNITY)
Admission: EM | Admit: 2017-12-30 | Discharge: 2017-12-31 | Disposition: A | Discharge status: Home / Self Care | Payer: Medicare Other | Attending: Emergency Medicine | Admitting: Emergency Medicine

## 2017-12-30 ENCOUNTER — Encounter (HOSPITAL_COMMUNITY): Payer: Self-pay

## 2017-12-30 ENCOUNTER — Other Ambulatory Visit: Payer: Self-pay

## 2017-12-30 DIAGNOSIS — J40 Bronchitis, not specified as acute or chronic: Secondary | ICD-10-CM | POA: Insufficient documentation

## 2017-12-30 DIAGNOSIS — Z87891 Personal history of nicotine dependence: Secondary | ICD-10-CM | POA: Diagnosis not present

## 2017-12-30 DIAGNOSIS — Z79899 Other long term (current) drug therapy: Secondary | ICD-10-CM | POA: Diagnosis not present

## 2017-12-30 DIAGNOSIS — I10 Essential (primary) hypertension: Secondary | ICD-10-CM | POA: Diagnosis not present

## 2017-12-30 DIAGNOSIS — J029 Acute pharyngitis, unspecified: Secondary | ICD-10-CM

## 2017-12-30 LAB — GROUP A STREP BY PCR: GROUP A STREP BY PCR: NOT DETECTED

## 2017-12-30 NOTE — ED Provider Notes (Signed)
[facility] EMERGENCY DEPARTMENT Provider Note   CSN: [id number] Arrival date & time: 12/30/17  2304  Time seen 23:30 PM   History   Chief Complaint Chief Complaint  Patient presents with  . Sore Throat    HPI Martha Montgomery is a 37 y.o. female.  HPI patient states she started out with a cough about 2 weeks ago that was initially clear however it got green the past couple of days and tastes bad.  She has had clear rhinorrhea off and on.  She started having a sore throat today and states when she looked at her throat she saw some nodules.  She states her throat hurts even without eating or swallowing.  She states it does not feel like when she had strep although her child also had pharyngitis recently after being exposed to a child with scarlet fever.  She denies feeling short of breath, having body aches, or constant chest pain, she states every once in a while she gets a little sharp jab in various places around her chest.  PCP Fayrene Helper, MD   Past Medical History:  Diagnosis Date  . Anxiety   . Anxiety disorder   . Arthritis    "knees, joints, hands, toes" (09/28/2013)  . Depression   . Fibromyalgia   . Gastritis nov. 2011   EGD by Dr. Oneida Alar, negative h pylori  . GERD (gastroesophageal reflux disease)   . Gout   . Hypertension x 6 years   . Hypothyroidism   . Migraines    "15/month average" (09/28/2013)  . Spinal stenosis   . Thyroid goiter     Patient Active Problem List   Diagnosis Date Noted  . Low vitamin D level 11/30/2017  . Esophageal dysphagia 09/09/2017  . Bloating 03/05/2017  . Bilateral knee pain 01/05/2017  . Amenorrhea 01/05/2017  . Bilateral hip pain 07/22/2016  . Morbid obesity ([place]) 06/30/2015  . Post-surgical hypothyroidism 06/02/2014  . HTN (hypertension), benign 06/30/2012  . Knee pain 06/02/2012  . Patellar malalignment syndrome 03/09/2012  . IBS (irritable bowel syndrome) 08/29/2011  . GERD (gastroesophageal reflux disease)  08/29/2011  . Rectal bleeding 12/10/2010  . BACK PAIN 09/24/2010  . Migraine with aura 04/02/2010  . Constipation 09/12/2009  . Allergic rhinitis 06/16/2009  . Anxiety and depression 10/18/2008    Past Surgical History:  Procedure Laterality Date  . BIOPSY  10/20/2017   Procedure: BIOPSY;  Surgeon: Danie Binder, MD;  Location: [facility];  Service: Endoscopy;;  gastric duodenal esophagus  . CESAREAN SECTION  2008  . CHONDROPLASTY  04/02/2012   Procedure: CHONDROPLASTY;  Surgeon: Carole Civil, MD;  Location: [facility];  Service: Orthopedics;  Laterality: Left;  of left patella  . COLONOSCOPY  May 2012   Dr. Oneida Alar: normal colon, small internal hemorrhoids  . COLONOSCOPY WITH PROPOFOL N/A 10/22/2017   Procedure: COLONOSCOPY WITH PROPOFOL;  Surgeon: Danie Binder, MD;  Location: [facility];  Service: Endoscopy;  Laterality: N/A;  7:30am  . COLONOSCOPY WITH PROPOFOL N/A 10/20/2017   Procedure: COLONOSCOPY WITH PROPOFOL;  Surgeon: Danie Binder, MD;  Location: [facility];  Service: Endoscopy;  Laterality: N/A;  11:00am  . ESOPHAGOGASTRODUODENOSCOPY (EGD) WITH PROPOFOL N/A 10/20/2017   Procedure: ESOPHAGOGASTRODUODENOSCOPY (EGD) WITH PROPOFOL;  Surgeon: Danie Binder, MD;  Location: [facility];  Service: Endoscopy;  Laterality: N/A;  . INCISION AND DRAINAGE ABSCESS  1997; 2005   "under right buttocks; under left arm"  . SAVORY  DILATION N/A 10/20/2017   Procedure: SAVORY DILATION;  Surgeon: Danie Binder, MD;  Location: [facility];  Service: Endoscopy;  Laterality: N/A;  . THYROIDECTOMY N/A 09/28/2013   Procedure: TOTAL THYROIDECTOMY;  Surgeon: Ascencion Dike, MD;  Location: [place];  Service: ENT;  Laterality: N/A;  . TOTAL THYROIDECTOMY  09/27/2013  . UPPER GI ENDOSCOPY  2011   EGD: gastritis, no h.pylori     OB History   None      Home Medications    Prior to Admission medications   Medication Sig Start Date End Date Taking? Authorizing Provider  amLODipine  (NORVASC) 2.5 MG tablet Take 1 tablet (2.5 mg total) by mouth daily. 11/11/17   Fayrene Helper, MD  amoxicillin (AMOXIL) 500 MG capsule Take 1 capsule (500 mg total) by mouth 3 (three) times daily. 12/31/17   Rolland Porter, MD  azithromycin (ZITHROMAX) 250 MG tablet Take 2 po the first day then once a day for the next 4 days. 12/31/17   Rolland Porter, MD  buprenorphine (BUTRANS) 10 MCG/HR PTWK patch Place 10 mcg onto the skin once a week.    [provider]  cyclobenzaprine (FLEXERIL) 10 MG tablet Take 10 mg by mouth daily as needed for muscle spasms.    [provider]  diphenhydrAMINE (BENADRYL) 25 MG tablet Take 25 mg by mouth daily.    [provider]  fluticasone (FLONASE) 50 MCG/ACT nasal spray Place 1 spray into both nostrils daily as needed for allergies or rhinitis.    [provider]  hydrochlorothiazide (MICROZIDE) 12.5 MG capsule Take 1 capsule (12.5 mg total) by mouth daily. 11/11/17   Fayrene Helper, MD  HYDROcodone-acetaminophen (NORCO) 7.5-325 MG tablet Take 1 tablet by mouth 2 (two) times daily as needed for moderate pain.     [provider]  levothyroxine (SYNTHROID) 200 MCG tablet Take 1 tablet (200 mcg total) by mouth daily before breakfast. 06/22/17   Fayrene Helper, MD  lubiprostone (AMITIZA) 24 MCG capsule Take 1 capsule (24 mcg total) by mouth 2 (two) times daily with a meal. 09/09/17   Mahala Menghini, PA-C  medroxyPROGESTERone (DEPO-PROVERA) 150 MG/ML injection Inject 150 mg into the muscle every 3 (three) months.    [provider]  metoprolol tartrate (LOPRESSOR) 50 MG tablet TAKE ONE TABLET BY MOUTH TWICE DAILY 06/02/17   Fayrene Helper, MD  OVER THE COUNTER MEDICATION Take 5 capsules by mouth daily. Kratom otc supplement    [provider]  pantoprazole (PROTONIX) 40 MG tablet Take 1 tablet (40 mg total) by mouth daily. Take 30 minutes before breakfast Patient taking differently: Take 40 mg by mouth  daily as needed (pain). Take 30 minutes before breakfast 03/05/17   Annitta Needs, NP  potassium chloride (K-DUR) 10 MEQ tablet Take 1 tablet (10 mEq total) by mouth daily. 07/22/16   Fayrene Helper, MD  pregabalin (LYRICA) 200 MG capsule Take 200 mg by mouth 3 (three) times daily.    [provider]    Family History Family History  Problem Relation Age of Onset  . Arthritis Mother   . Migraines Mother   . Hypertension Mother   . Cancer Maternal Grandmother        lung  . Asthma Unknown        family history   . Colon cancer Neg Hx     Social History Social History   Tobacco Use  . Smoking status: Former Smoker  Packs/day: 1.00    Years: 5.00    Pack years: 5.00    Types: Cigarettes    Last attempt to quit: 09/11/2010    Years since quitting: 7.3  . Smokeless tobacco: Never Used  . Tobacco comment: QUIT SMOKING X 3 YEARS AGO  Substance Use Topics  . Alcohol use: No  . Drug use: No  denies smoking On disability for chronic migraines and arthritis in her back   Allergies   Codeine; Ibuprofen; and Soybean-containing drug products   Review of Systems Review of Systems  All other systems reviewed and are negative.    Physical Exam Updated Vital Signs BP 130/87 (BP Location: Left Arm)   Pulse (!) 119   Temp 99.8 F (37.7 C) (Oral)   Resp 20   Ht 5\' 3"  (1.6 m)   Wt 90.7 kg (200 lb)   SpO2 96%   BMI 35.43 kg/m   Vital signs normal except for tachycardia   Physical Exam  Constitutional: She is oriented to person, place, and time. She appears well-developed and well-nourished.  Non-toxic appearance. She does not appear ill. No distress.  HENT:  Head: Normocephalic and atraumatic.  Right Ear: External ear normal.  Left Ear: External ear normal.  Nose: Nose normal. No mucosal edema or rhinorrhea.  Mouth/Throat: Uvula is midline and mucous membranes are normal. No dental abscesses or uvula swelling. No posterior oropharyngeal edema.  Pt has a  few white patches on her tonsils, they are not enlarged. No soft palate swelling  Eyes: Pupils are equal, round, and reactive to light. Conjunctivae and EOM are normal.  Neck: Normal range of motion and full passive range of motion without pain. Neck supple.  Cardiovascular: Normal rate, regular rhythm and normal heart sounds. Exam reveals no gallop and no friction rub.  No murmur heard. Pulmonary/Chest: Effort normal and breath sounds normal. No respiratory distress. She has no wheezes. She has no rhonchi. She has no rales. She exhibits no tenderness and no crepitus.  Abdominal: Soft. Normal appearance and bowel sounds are normal. She exhibits no distension. There is no tenderness. There is no rebound and no guarding.  Musculoskeletal: Normal range of motion. She exhibits no edema or tenderness.  Moves all extremities well.   Lymphadenopathy:    She has no cervical adenopathy.  Neurological: She is alert and oriented to person, place, and time. She has normal strength. No cranial nerve deficit.  Skin: Skin is warm, dry and intact. No rash noted. No erythema. No pallor.  Psychiatric: She has a normal mood and affect. Her speech is normal and behavior is normal. Her mood appears not anxious.  Nursing note and vitals reviewed.    ED Treatments / Results  Labs (all labs ordered are listed, but only abnormal results are displayed) Results for orders placed or performed during the hospital encounter of 12/30/17  Group A Strep by PCR  Result Value Ref Range   Group A Strep by PCR NOT DETECTED NOT DETECTED   Laboratory interpretation all normal     EKG None  Radiology No results found.  Procedures Procedures (including critical care time)  Medications Ordered in ED Medications  amoxicillin (AMOXIL) capsule 500 mg (has no administration in time range)     Initial Impression / Assessment and Plan / ED Course  I have reviewed the triage vital signs and the nursing  notes.  Pertinent labs & imaging results that were available during my care of the patient were reviewed by me  and considered in my medical decision making (see chart for details).     Strep screen was done, talked to the patient that we could do an x-ray but I was going to put her on antibiotics anyway, she is not having fever or shortness of breath to suggest a severe pneumonia that would require hospitalization.  And she is agreeable with that.  Patient strep was negative.  She was started on amoxicillin and a Z-Pak for her bronchitis and non-strep pharyngitis.  Final Clinical Impressions(s) / ED Diagnoses   Final diagnoses:  Pharyngitis, unspecified etiology  Bronchitis    ED Discharge Orders        Ordered    amoxicillin (AMOXIL) 500 MG capsule  3 times daily     12/31/17 0003    azithromycin (ZITHROMAX) 250 MG tablet     12/31/17 0003    OTC mucinex DM for cough  Plan discharge  Rolland Porter, MD, Barbette Or, MD 12/31/17 [phone]

## 2017-12-30 NOTE — ED Triage Notes (Signed)
Pt states she has been having productive cough for about 2 weeks, today with sore throat.

## 2017-12-31 MED ORDER — AMOXICILLIN 250 MG PO CAPS
500.0000 mg | ORAL_CAPSULE | Freq: Once | ORAL | Status: AC
  Administered 2017-12-31: 500 mg via ORAL
  Filled 2017-12-31: qty 2

## 2017-12-31 MED ORDER — AMOXICILLIN 500 MG PO CAPS: 500.0000 mg | ORAL_CAPSULE | Freq: Three times a day (TID) | ORAL | 0 refills | Status: DC

## 2017-12-31 MED ORDER — AZITHROMYCIN 250 MG PO TABS: ORAL_TABLET | ORAL | 0 refills | Status: DC

## 2017-12-31 NOTE — Discharge Instructions (Signed)
Your strep screen was negative. Drink plenty of fluids. Take mucinex DM OTC for cough. Take the antibiotics until gone.  Recheck if you get a high fever, struggle to breathe or seem worse.

## 2018-01-04 ENCOUNTER — Encounter: Payer: Self-pay | Admitting: Family Medicine

## 2018-01-04 ENCOUNTER — Other Ambulatory Visit: Payer: Self-pay | Admitting: Family Medicine

## 2018-01-04 ENCOUNTER — Telehealth: Payer: Self-pay | Admitting: Family Medicine

## 2018-01-04 ENCOUNTER — Telehealth: Payer: Self-pay

## 2018-01-04 MED ORDER — FLUCONAZOLE 150 MG PO TABS: ORAL_TABLET | ORAL | 0 refills | Status: DC

## 2018-01-04 NOTE — Telephone Encounter (Signed)
Pt was seen in the ER last week, was given Amoxicellin, and Zpack. And now has issues with a yeast infection, can you call her in difocan---[facility] on [address] Thanks.Marland Kitchen   FYI-**TO DR SIMPSON**   Pt did get your note regarding being pre diabetic, and the change in the thyroid medicine

## 2018-01-04 NOTE — Telephone Encounter (Signed)
Completed and pt is aware.  

## 2018-01-04 NOTE — Telephone Encounter (Signed)
[facility] - left message.  

## 2018-01-20 DIAGNOSIS — M170 Bilateral primary osteoarthritis of knee: Secondary | ICD-10-CM | POA: Diagnosis not present

## 2018-01-20 DIAGNOSIS — M47816 Spondylosis without myelopathy or radiculopathy, lumbar region: Secondary | ICD-10-CM | POA: Diagnosis not present

## 2018-01-20 DIAGNOSIS — G894 Chronic pain syndrome: Secondary | ICD-10-CM | POA: Diagnosis not present

## 2018-01-26 ENCOUNTER — Other Ambulatory Visit: Payer: Self-pay | Admitting: Gastroenterology

## 2018-02-17 DIAGNOSIS — M170 Bilateral primary osteoarthritis of knee: Secondary | ICD-10-CM | POA: Diagnosis not present

## 2018-02-17 DIAGNOSIS — M47816 Spondylosis without myelopathy or radiculopathy, lumbar region: Secondary | ICD-10-CM | POA: Diagnosis not present

## 2018-02-17 DIAGNOSIS — G894 Chronic pain syndrome: Secondary | ICD-10-CM | POA: Diagnosis not present

## 2018-02-19 ENCOUNTER — Ambulatory Visit: Payer: Medicare Other | Admitting: Nurse Practitioner

## 2018-03-05 ENCOUNTER — Telehealth: Payer: Self-pay

## 2018-03-05 NOTE — Telephone Encounter (Signed)
[facility] - Left Msg 

## 2018-03-16 ENCOUNTER — Ambulatory Visit (INDEPENDENT_AMBULATORY_CARE_PROVIDER_SITE_OTHER): Payer: Medicare Other | Admitting: Family Medicine

## 2018-03-16 ENCOUNTER — Telehealth: Payer: Self-pay

## 2018-03-16 ENCOUNTER — Other Ambulatory Visit: Payer: Self-pay

## 2018-03-16 ENCOUNTER — Encounter: Payer: Self-pay | Admitting: Family Medicine

## 2018-03-16 VITALS — BP 130/84 | HR 88 | Resp 12 | Ht 63.0 in | Wt 198.0 lb

## 2018-03-16 DIAGNOSIS — E890 Postprocedural hypothyroidism: Secondary | ICD-10-CM

## 2018-03-16 DIAGNOSIS — Z23 Encounter for immunization: Secondary | ICD-10-CM

## 2018-03-16 DIAGNOSIS — I10 Essential (primary) hypertension: Secondary | ICD-10-CM | POA: Diagnosis not present

## 2018-03-16 DIAGNOSIS — IMO0001 Reserved for inherently not codable concepts without codable children: Secondary | ICD-10-CM

## 2018-03-16 DIAGNOSIS — F419 Anxiety disorder, unspecified: Secondary | ICD-10-CM

## 2018-03-16 DIAGNOSIS — Z789 Other specified health status: Secondary | ICD-10-CM

## 2018-03-16 DIAGNOSIS — F329 Major depressive disorder, single episode, unspecified: Secondary | ICD-10-CM

## 2018-03-16 LAB — POCT URINE PREGNANCY: Preg Test, Ur: NEGATIVE

## 2018-03-16 MED ORDER — FLUOXETINE HCL 20 MG PO TABS: 20.0000 mg | ORAL_TABLET | Freq: Every day | ORAL | 5 refills | Status: DC

## 2018-03-16 MED ORDER — MEDROXYPROGESTERONE ACETATE 150 MG/ML IM SUSP
150.0000 mg | Freq: Once | INTRAMUSCULAR | Status: AC
  Administered 2018-03-16: 150 mg via INTRAMUSCULAR

## 2018-03-16 NOTE — Telephone Encounter (Signed)
2nd attempt - [facility]   

## 2018-03-16 NOTE — Patient Instructions (Addendum)
F/U in 12 weeks with MD and for depo, call if you  need me before  Flu vaccine today  Please get non fasting chem 7 and EGFr and TSH this week ([facility])   Depo today  Please call Counsellor  New for depression is fluoxetine 20 mg daily 

## 2018-03-20 ENCOUNTER — Encounter: Payer: Self-pay | Admitting: Family Medicine

## 2018-03-20 DIAGNOSIS — Z309 Encounter for contraceptive management, unspecified: Secondary | ICD-10-CM | POA: Insufficient documentation

## 2018-03-20 NOTE — Assessment & Plan Note (Signed)
Depo provera past due, urine pregnancy test is negative, depo proverA ADMINISTERED

## 2018-03-20 NOTE — Progress Notes (Signed)
Martha Montgomery     MRN: [id number]      DOB: 28-Mar-1981   HPI Martha Montgomery is here for follow up and re-evaluation of chronic medical conditions, medication management and review of any available recent lab and radiology data.  Preventive health is updated, specifically  Cancer screening and Immunization.   Questions or concerns regarding consultations or procedures which the PT has had in the interim are  addressed. The PT denies any adverse reactions to current medications since the last visit.  Has  Missed depo and needs to resume, denies sexual activity for over 1 year, but still needs confirmation that she is not pregnant Has been modifying lifestyle with good weight loss success C/o depression and wants to resume treatment, not suicidal or homicidal ROS Denies recent fever or chills. Denies sinus pressure, nasal congestion, ear pain or sore throat. Denies chest congestion, productive cough or wheezing. Denies chest pains, palpitations and leg swelling Denies abdominal pain, nausea, vomiting,diarrhea or constipation.   Denies dysuria, frequency, hesitancy or incontinence. Denies joint pain, swelling and limitation in mobility. Denies headaches, seizures, numbness, or tingling.  Denies skin break down or rash.   PE  BP 130/84 (BP Location: Left Arm, Patient Position: Sitting, Cuff Size: Large)   Pulse 88   Resp 12   Ht 5\' 3"  (1.6 m)   Wt 198 lb 0.6 oz (89.8 kg)   SpO2 97% Comment: room air  BMI 35.08 kg/m   Patient alert and oriented and in no cardiopulmonary distress.  HEENT: No facial asymmetry, EOMI,   oropharynx pink and moist.  Neck supple no JVD, no mass.  Chest: Clear to auscultation bilaterally.  CVS: S1, S2 no murmurs, no S3.Regular rate.  ABD: Soft non tender.   Ext: No edema  MS: Adequate ROM spine, shoulders, hips and knees.  Skin: Intact, no ulcerations or rash noted.  Psych: Good eye contact, normal affect. Memory intact not anxious or depressed  appearing.  CNS: CN 2-12 intact, power,  normal throughout.no focal deficits noted.   Assessment & Plan  HTN (hypertension), benign Controlled, no change in medication DASH diet and commitment to daily physical activity for a minimum of 30 minutes discussed and encouraged, as a part of hypertension management. The importance of attaining a healthy weight is also discussed.  BP/Weight 03/16/2018 12/30/2017 11/11/2017 10/22/2017 10/20/2017 10/13/2017 5/95/6387  Systolic BP 564 332 951 884 166 063 016  Diastolic BP 84 87 80 78 88 90 87  Wt. (Lbs) 198.04 200 207 - - 217 212  BMI 35.08 35.43 36.67 - - 38.44 37.55       Morbid obesity ([place]) Imporoved Patient re-educated about  the importance of commitment to a  minimum of 150 minutes of exercise per week.  The importance of healthy food choices with portion control discussed. Encouraged to start a food diary, count calories and to consider  joining a support group. Sample diet sheets offered. Goals set by the patient for the next several months.   Weight /BMI 03/16/2018 12/30/2017 11/11/2017  WEIGHT 198 lb 0.6 oz 200 lb 207 lb  HEIGHT 5\' 3"  5\' 3"  5\' 3"   BMI 35.08 kg/m2 35.43 kg/m2 36.67 kg/m2      Post-surgical hypothyroidism Slightly over corrected, TSH 0.31 in 10/2017 , updated lab needed  Anxiety and depression Start fluoxetine and refer to telepsych, f/u in 12 weeks  Contraception Depo provera past due, urine pregnancy test is negative, depo proverA ADMINISTERED  Need for influenza vaccination After  obtaining informed consent, the vaccine is  administered by LPN.

## 2018-03-20 NOTE — Assessment & Plan Note (Signed)
Start fluoxetine and refer to telepsych, f/u in 12 weeks

## 2018-03-20 NOTE — Assessment & Plan Note (Signed)
After obtaining informed consent, the vaccine is  administered by LPN.  

## 2018-03-20 NOTE — Assessment & Plan Note (Signed)
Slightly over corrected, TSH 0.31 in 10/2017 , updated lab needed

## 2018-03-20 NOTE — Assessment & Plan Note (Signed)
Imporoved Patient re-educated about  the importance of commitment to a  minimum of 150 minutes of exercise per week.  The importance of healthy food choices with portion control discussed. Encouraged to start a food diary, count calories and to consider  joining a support group. Sample diet sheets offered. Goals set by the patient for the next several months.   Weight /BMI 03/16/2018 12/30/2017 11/11/2017  WEIGHT 198 lb 0.6 oz 200 lb 207 lb  HEIGHT 5\' 3"  5\' 3"  5\' 3"   BMI 35.08 kg/m2 35.43 kg/m2 36.67 kg/m2

## 2018-03-20 NOTE — Assessment & Plan Note (Signed)
Controlled, no change in medication DASH diet and commitment to daily physical activity for a minimum of 30 minutes discussed and encouraged, as a part of hypertension management. The importance of attaining a healthy weight is also discussed.  BP/Weight 03/16/2018 12/30/2017 11/11/2017 10/22/2017 10/20/2017 10/13/2017 1/66/0630  Systolic BP 160 109 323 557 322 025 427  Diastolic BP 84 87 80 78 88 90 87  Wt. (Lbs) 198.04 200 207 - - 217 212  BMI 35.08 35.43 36.67 - - 38.44 37.55

## 2018-03-29 ENCOUNTER — Telehealth: Payer: Self-pay

## 2018-03-29 NOTE — Telephone Encounter (Signed)
[facility] - Left Msg 

## 2018-04-06 ENCOUNTER — Telehealth: Payer: Self-pay

## 2018-04-06 NOTE — Telephone Encounter (Signed)
[facility] - Several attempts have been made to contact patient without success. Patient will be placed on the inactive list.  If services are needed again.  Please contact [facility] at [phone].    Information will be routed to the PCP and Dr. Hisada  

## 2018-04-13 DIAGNOSIS — I10 Essential (primary) hypertension: Secondary | ICD-10-CM | POA: Diagnosis not present

## 2018-04-13 LAB — BASIC METABOLIC PANEL WITH GFR
BUN: 9 mg/dL (ref 7–25)
CALCIUM: 9.2 mg/dL (ref 8.6–10.2)
CHLORIDE: 106 mmol/L (ref 98–110)
CO2: 24 mmol/L (ref 20–32)
Creat: 0.78 mg/dL (ref 0.50–1.10)
GFR, EST NON AFRICAN AMERICAN: 98 mL/min/{1.73_m2} (ref >=60)
GFR, Est African American: 113 mL/min/{1.73_m2} (ref >=60)
Glucose, Bld: 87 mg/dL (ref 65–99)
POTASSIUM: 4 mmol/L (ref 3.5–5.3)
Sodium: 140 mmol/L (ref 135–146)

## 2018-04-13 LAB — TSH: TSH: 8.28 mIU/L — ABNORMAL HIGH

## 2018-04-14 ENCOUNTER — Encounter: Payer: Self-pay | Admitting: Family Medicine

## 2018-04-16 ENCOUNTER — Encounter: Payer: Self-pay | Admitting: Family Medicine

## 2018-04-16 ENCOUNTER — Other Ambulatory Visit: Payer: Self-pay | Admitting: Family Medicine

## 2018-04-16 DIAGNOSIS — E049 Nontoxic goiter, unspecified: Secondary | ICD-10-CM

## 2018-04-16 DIAGNOSIS — E039 Hypothyroidism, unspecified: Secondary | ICD-10-CM

## 2018-04-16 DIAGNOSIS — R7989 Other specified abnormal findings of blood chemistry: Secondary | ICD-10-CM

## 2018-04-16 MED ORDER — LEVOTHYROXINE SODIUM 200 MCG PO TABS: ORAL_TABLET | ORAL | 2 refills | Status: DC

## 2018-04-16 NOTE — Progress Notes (Signed)
Dose increase sent to pharmacy and pt referred for [place] and to endo

## 2018-04-20 ENCOUNTER — Ambulatory Visit (HOSPITAL_COMMUNITY)
Admission: RE | Admit: 2018-04-20 | Discharge: 2018-04-20 | Disposition: A | Discharge status: Home / Self Care | Payer: Medicare Other | Source: Ambulatory Visit | Attending: Family Medicine | Admitting: Family Medicine

## 2018-04-20 DIAGNOSIS — E0789 Other specified disorders of thyroid: Secondary | ICD-10-CM | POA: Diagnosis not present

## 2018-04-20 DIAGNOSIS — Z9889 Other specified postprocedural states: Secondary | ICD-10-CM | POA: Insufficient documentation

## 2018-04-20 DIAGNOSIS — E049 Nontoxic goiter, unspecified: Secondary | ICD-10-CM

## 2018-04-20 DIAGNOSIS — R7989 Other specified abnormal findings of blood chemistry: Secondary | ICD-10-CM | POA: Diagnosis not present

## 2018-04-20 DIAGNOSIS — E039 Hypothyroidism, unspecified: Secondary | ICD-10-CM | POA: Diagnosis present

## 2018-04-20 DIAGNOSIS — E890 Postprocedural hypothyroidism: Secondary | ICD-10-CM | POA: Diagnosis not present

## 2018-04-25 ENCOUNTER — Encounter: Payer: Self-pay | Admitting: Family Medicine

## 2018-05-10 ENCOUNTER — Encounter (HOSPITAL_COMMUNITY): Payer: Self-pay

## 2018-05-10 ENCOUNTER — Emergency Department (HOSPITAL_COMMUNITY)
Admission: EM | Admit: 2018-05-10 | Discharge: 2018-05-10 | Disposition: A | Discharge status: Home / Self Care | Payer: Medicare Other | Attending: Emergency Medicine | Admitting: Emergency Medicine

## 2018-05-10 ENCOUNTER — Emergency Department (HOSPITAL_COMMUNITY): Payer: Medicare Other

## 2018-05-10 ENCOUNTER — Other Ambulatory Visit: Payer: Self-pay

## 2018-05-10 DIAGNOSIS — Y998 Other external cause status: Secondary | ICD-10-CM | POA: Insufficient documentation

## 2018-05-10 DIAGNOSIS — Z79899 Other long term (current) drug therapy: Secondary | ICD-10-CM | POA: Diagnosis not present

## 2018-05-10 DIAGNOSIS — E039 Hypothyroidism, unspecified: Secondary | ICD-10-CM | POA: Insufficient documentation

## 2018-05-10 DIAGNOSIS — Z87891 Personal history of nicotine dependence: Secondary | ICD-10-CM | POA: Diagnosis not present

## 2018-05-10 DIAGNOSIS — S8991XA Unspecified injury of right lower leg, initial encounter: Secondary | ICD-10-CM | POA: Diagnosis not present

## 2018-05-10 DIAGNOSIS — Y929 Unspecified place or not applicable: Secondary | ICD-10-CM | POA: Insufficient documentation

## 2018-05-10 DIAGNOSIS — W010XXA Fall on same level from slipping, tripping and stumbling without subsequent striking against object, initial encounter: Secondary | ICD-10-CM | POA: Insufficient documentation

## 2018-05-10 DIAGNOSIS — Y939 Activity, unspecified: Secondary | ICD-10-CM | POA: Insufficient documentation

## 2018-05-10 DIAGNOSIS — M25561 Pain in right knee: Secondary | ICD-10-CM | POA: Diagnosis not present

## 2018-05-10 DIAGNOSIS — S8001XA Contusion of right knee, initial encounter: Secondary | ICD-10-CM | POA: Diagnosis not present

## 2018-05-10 DIAGNOSIS — I10 Essential (primary) hypertension: Secondary | ICD-10-CM | POA: Diagnosis not present

## 2018-05-10 NOTE — Discharge Instructions (Addendum)
Your vital signs are within normal limits.  The x-ray of your knee is negative for any acute fracture or dislocation.  There is a pronounced fat pad in the area of your patella, but no acute changes noted on your x-ray.  Please use your knee sleeve over the next 5 to 7 days.  Apply ice tonight.  Use Tylenol extra strength for pain or discomfort.  Please see Dr. Moshe Cipro for assistance with your pain management if any changes, problems, or concerns.

## 2018-05-10 NOTE — ED Provider Notes (Signed)
[facility] EMERGENCY DEPARTMENT Provider Note   CSN: [id number] Arrival date & time: 05/10/18  1606     History   Chief Complaint Chief Complaint  Patient presents with  . Knee Pain    HPI Martha Montgomery is a 37 y.o. female.  HPI  Past Medical History:  Diagnosis Date  . Anxiety   . Anxiety disorder   . Arthritis    "knees, joints, hands, toes" (09/28/2013)  . Depression   . Fibromyalgia   . Gastritis nov. 2011   EGD by Dr. Oneida Alar, negative h pylori  . GERD (gastroesophageal reflux disease)   . Gout   . Hypertension x 6 years   . Hypothyroidism   . Migraines    "15/month average" (09/28/2013)  . Spinal stenosis   . Thyroid goiter     Patient Active Problem List   Diagnosis Date Noted  . Contraception 03/20/2018  . Low vitamin D level 11/30/2017  . Esophageal dysphagia 09/09/2017  . Bloating 03/05/2017  . Bilateral knee pain 01/05/2017  . Amenorrhea 01/05/2017  . Bilateral hip pain 07/22/2016  . Morbid obesity ([place]) 06/30/2015  . Post-surgical hypothyroidism 06/02/2014  . Need for influenza vaccination 06/02/2014  . HTN (hypertension), benign 06/30/2012  . Knee pain 06/02/2012  . Patellar malalignment syndrome 03/09/2012  . IBS (irritable bowel syndrome) 08/29/2011  . GERD (gastroesophageal reflux disease) 08/29/2011  . Rectal bleeding 12/10/2010  . BACK PAIN 09/24/2010  . Migraine with aura 04/02/2010  . Constipation 09/12/2009  . Allergic rhinitis 06/16/2009  . Anxiety and depression 10/18/2008    Past Surgical History:  Procedure Laterality Date  . BIOPSY  10/20/2017   Procedure: BIOPSY;  Surgeon: Danie Binder, MD;  [facility];  Service: Endoscopy;;  gastric duodenal esophagus  . CESAREAN SECTION  2008  . CHONDROPLASTY  04/02/2012   Procedure: CHONDROPLASTY;  Surgeon: Carole Civil, MD;  Location: [facility];  Service: Orthopedics;  Laterality: Left;  of left patella  . COLONOSCOPY  May 2012   Dr. Oneida Alar: normal colon, small  internal hemorrhoids  . COLONOSCOPY WITH PROPOFOL N/A 10/22/2017   Procedure: COLONOSCOPY WITH PROPOFOL;  Surgeon: Danie Binder, MD;  [facility];  Service: Endoscopy;  Laterality: N/A;  7:30am  . COLONOSCOPY WITH PROPOFOL N/A 10/20/2017   Procedure: COLONOSCOPY WITH PROPOFOL;  Surgeon: Danie Binder, MD;  [facility];  Service: Endoscopy;  Laterality: N/A;  11:00am  . ESOPHAGOGASTRODUODENOSCOPY (EGD) WITH PROPOFOL N/A 10/20/2017   Procedure: ESOPHAGOGASTRODUODENOSCOPY (EGD) WITH PROPOFOL;  Surgeon: Danie Binder, MD;  [facility];  Service: Endoscopy;  Laterality: N/A;  . INCISION AND DRAINAGE ABSCESS  1997; 2005   "under right buttocks; under left arm"  . SAVORY DILATION N/A 10/20/2017   Procedure: SAVORY DILATION;  Surgeon: Danie Binder, MD;  [facility];  Service: Endoscopy;  Laterality: N/A;  . THYROIDECTOMY N/A 09/28/2013   Procedure: TOTAL THYROIDECTOMY;  Surgeon: Ascencion Dike, MD;  Location: [place];  Service: ENT;  Laterality: N/A;  . TOTAL THYROIDECTOMY  09/27/2013  . UPPER GI ENDOSCOPY  2011   EGD: gastritis, no h.pylori     OB History   None      Home Medications    Prior to Admission medications   Medication Sig Start Date End Date Taking? Authorizing Provider  amLODipine (NORVASC) 2.5 MG tablet Take 1 tablet (2.5 mg total) by mouth daily. 11/11/17   Fayrene Helper, MD  diphenhydrAMINE (  BENADRYL) 25 MG tablet Take 25 mg by mouth daily.    [provider]  fluconazole (DIFLUCAN) 150 MG tablet One tablet once daily as needed , for vaginal itch associated with prolonged antibiotic use 01/04/18   Fayrene Helper, MD  FLUoxetine (PROZAC) 20 MG tablet Take 1 tablet (20 mg total) by mouth daily. 03/16/18   Fayrene Helper, MD  fluticasone (FLONASE) 50 MCG/ACT nasal spray Place 1 spray into both nostrils daily as needed for allergies or rhinitis.    [provider]  hydrochlorothiazide (MICROZIDE) 12.5 MG  capsule Take 1 capsule (12.5 mg total) by mouth daily. 11/11/17   Fayrene Helper, MD  levothyroxine (SYNTHROID, LEVOTHROID) 200 MCG tablet Take one and a half tablets daily before breakfast every Monday, Wednesday, Friday and Sunday, take one tablet every Tuesday, Thursday and Saturday 04/16/18 07/16/18  Fayrene Helper, MD  lubiprostone (AMITIZA) 24 MCG capsule Take 1 capsule (24 mcg total) by mouth 2 (two) times daily with a meal. 09/09/17   Mahala Menghini, PA-C  medroxyPROGESTERone (DEPO-PROVERA) 150 MG/ML injection Inject 150 mg into the muscle every 3 (three) months.    [provider]  metoprolol tartrate (LOPRESSOR) 50 MG tablet TAKE ONE TABLET BY MOUTH TWICE DAILY 06/02/17   Fayrene Helper, MD  Morphine Sulfate ER 15 MG T12A Take 15 mg by mouth 2 (two) times daily. 01/18/18   [provider]  pantoprazole (PROTONIX) 40 MG tablet Take 1 tablet (40 mg total) by mouth daily. Take 30 minutes before breakfast Patient taking differently: Take 40 mg by mouth daily as needed (pain). Take 30 minutes before breakfast 03/05/17   Annitta Needs, NP  potassium chloride (K-DUR) 10 MEQ tablet Take 1 tablet (10 mEq total) by mouth daily. 07/22/16   Fayrene Helper, MD    Family History Family History  Problem Relation Age of Onset  . Arthritis Mother   . Migraines Mother   . Hypertension Mother   . Cancer Maternal Grandmother        lung  . Asthma Unknown        family history   . Colon cancer Neg Hx     Social History Social History   Tobacco Use  . Smoking status: Former Smoker    Packs/day: 1.00    Years: 5.00    Pack years: 5.00    Types: Cigarettes    Last attempt to quit: 09/11/2010    Years since quitting: 7.6  . Smokeless tobacco: Never Used  . Tobacco comment: QUIT SMOKING X 3 YEARS AGO  Substance Use Topics  . Alcohol use: No  . Drug use: No     Allergies   Codeine; Ibuprofen; and Soybean-containing drug products   Review of Systems Review  of Systems  Constitutional: Negative for activity change.       All ROS Neg except as noted in HPI  HENT: Negative for nosebleeds.   Eyes: Negative for photophobia and discharge.  Respiratory: Negative for cough, shortness of breath and wheezing.   Cardiovascular: Negative for chest pain and palpitations.  Gastrointestinal: Negative for abdominal pain and blood in stool.  Genitourinary: Negative for dysuria, frequency and hematuria.  Musculoskeletal: Positive for arthralgias. Negative for back pain and neck pain.       Knee pain  Skin: Negative.   Neurological: Negative for dizziness, seizures and speech difficulty.  Psychiatric/Behavioral: Negative for confusion and hallucinations.     Physical Exam Updated Vital Signs BP 134/82 (  BP Location: Right Arm)   Pulse 79   Temp 98.4 F (36.9 C) (Oral)   Resp 16   Ht 5\' 3"  (1.6 m)   Wt 86.2 kg   SpO2 100%   BMI 33.66 kg/m   Physical Exam  Constitutional: She is oriented to person, place, and time. She appears well-developed and well-nourished.  Non-toxic appearance.  HENT:  Head: Normocephalic.  Right Ear: Tympanic membrane and external ear normal.  Left Ear: Tympanic membrane and external ear normal.  Eyes: Pupils are equal, round, and reactive to light. EOM and lids are normal.  Neck: Normal range of motion. Neck supple. Carotid bruit is not present.  Cardiovascular: Normal rate, regular rhythm, normal heart sounds, intact distal pulses and normal pulses.  Pulmonary/Chest: Breath sounds normal. No respiratory distress.  Abdominal: Soft. Bowel sounds are normal. There is no tenderness. There is no guarding.  Musculoskeletal: Normal range of motion.  There is mild soreness of the right quadricep area, no hematoma noted.  There is pain to movement of the patella.  The patella is midline.  There is no joint effusion appreciated at this time.  There is no palpable deformity or hematoma involving the patella tendon.  There is no  posterior mass, however there is mild tenderness in the mid posterior knee area.  There is no deformity of the tibial area.  There is full range of motion of the right ankle.  The Achilles tendon is intact.  There is noted some crepitus with flexion extension of the right knee.  There is no pain or palpable deformity of the right hip.  Lymphadenopathy:       Head (right side): No submandibular adenopathy present.       Head (left side): No submandibular adenopathy present.    She has no cervical adenopathy.  Neurological: She is alert and oriented to person, place, and time. She has normal strength. No cranial nerve deficit or sensory deficit.  Skin: Skin is warm and dry.  Psychiatric: She has a normal mood and affect. Her speech is normal.  Nursing note and vitals reviewed.    ED Treatments / Results  Labs (all labs ordered are listed, but only abnormal results are displayed) Labs Reviewed - No data to display  EKG None  Radiology No results found.  Procedures Procedures (including critical care time)  Medications Ordered in ED Medications - No data to display   Initial Impression / Assessment and Plan / ED Course  I have reviewed the triage vital signs and the nursing notes.  Pertinent labs & imaging results that were available during my care of the patient were reviewed by me and considered in my medical decision making (see chart for details).       Final Clinical Impressions(s) / ED Diagnoses MDM  Vital signs are within normal limits.  Pulse oximetry is 100% on room air.  Within normal limits by my interpretation.  There are no gross neurovascular deficits noted of the lower extremity.  X-ray shows some patella alta related to Hoffa fat pad, but no other abnormality.  No fracture, no dislocation, no effusion noted.  I have informed the patient of the findings on examination as well as the findings on the x-ray.  Patient is fitted with a knee sleeve.  She is asked to  use Tylenol every 4 hours for pain.  Patient requested a narcotic medication as well.  The database indicates that she received 30 tablets of her narcotic medication on September  28.  I have asked the patient to see her primary physician or her pain management physician for any additional narcotic need.  Patient investigated on the PMP aware database.  Patient received a 30-day supply of narcotic pain medication on September 24.  I have asked the patient to see her primary physician or her pain management physician if she feels that any additional narcotic medication might be needed. Final diagnoses:  Contusion of right knee, initial encounter    ED Discharge Orders    None       Lily Kocher, PA-C 05/10/18 Sheryle Spray, MD 05/10/18 2016

## 2018-05-10 NOTE — ED Triage Notes (Signed)
Pt reported that she was sitting for long length last night and when she got up lost balance and fell on right knee. Knee is swollen and painful. Also reports back pain

## 2018-05-13 ENCOUNTER — Encounter: Payer: Self-pay | Admitting: "Endocrinology

## 2018-05-14 DIAGNOSIS — M25561 Pain in right knee: Secondary | ICD-10-CM | POA: Diagnosis not present

## 2018-05-14 DIAGNOSIS — M47816 Spondylosis without myelopathy or radiculopathy, lumbar region: Secondary | ICD-10-CM | POA: Diagnosis not present

## 2018-05-14 DIAGNOSIS — G894 Chronic pain syndrome: Secondary | ICD-10-CM | POA: Diagnosis not present

## 2018-05-14 DIAGNOSIS — Z8739 Personal history of other diseases of the musculoskeletal system and connective tissue: Secondary | ICD-10-CM | POA: Diagnosis not present

## 2018-05-14 DIAGNOSIS — M25562 Pain in left knee: Secondary | ICD-10-CM | POA: Diagnosis not present

## 2018-05-18 ENCOUNTER — Encounter: Payer: Self-pay | Admitting: Family Medicine

## 2018-05-19 ENCOUNTER — Encounter: Payer: Self-pay | Admitting: Nurse Practitioner

## 2018-05-19 ENCOUNTER — Ambulatory Visit (INDEPENDENT_AMBULATORY_CARE_PROVIDER_SITE_OTHER): Payer: Medicare Other | Admitting: Nurse Practitioner

## 2018-05-19 VITALS — BP 146/87 | HR 110 | Temp 98.0°F | Ht 63.0 in | Wt 197.2 lb

## 2018-05-19 DIAGNOSIS — K625 Hemorrhage of anus and rectum: Secondary | ICD-10-CM

## 2018-05-19 DIAGNOSIS — K5900 Constipation, unspecified: Secondary | ICD-10-CM

## 2018-05-19 DIAGNOSIS — K649 Unspecified hemorrhoids: Secondary | ICD-10-CM

## 2018-05-19 MED ORDER — LUBIPROSTONE 24 MCG PO CAPS: 24.0000 ug | ORAL_CAPSULE | Freq: Two times a day (BID) | ORAL | 3 refills | Status: DC

## 2018-05-19 MED ORDER — HYDROCORTISONE 2.5 % RE CREA: 1.0000 "application " | TOPICAL_CREAM | Freq: Two times a day (BID) | RECTAL | 1 refills | Status: DC

## 2018-05-19 NOTE — Patient Instructions (Signed)
1. I sent in Anusol rectal cream to your pharmacy.  You can apply this topically up to twice a day for up to 10 days at a time for hemorrhoid flareups. 2. As we discussed, you can titrate the dose of your Amitiza based on the response you are having.  If you are having more loose stools decrease the frequency.  You can start once a day and increase or decrease as needed. 3. Return for follow-up in 6 months. 4. Call us if you have any questions, concerns, or worsening symptoms.  At [facility] we value your feedback. You may receive a survey about your visit today. Please share your experience as we strive to create trusting relationships with our patients to provide genuine, compassionate, quality care.  We appreciate your understanding and patience as we review any laboratory studies, imaging, and other diagnostic tests that are ordered as we care for you. Our office policy is 5 business days for review of these results, and any emergent or urgent results are addressed in a timely manner for your best interest. If you do not hear from our office in 1 week, please contact us.   We also encourage the use of MyChart, which contains your medical information for your review as well. If you are not enrolled in this feature, an access code is on this after visit summary for your convenience. Thank you for allowing [place] to be involved in your care.  It was great to meet you today!  I hope you have a great Fall!!

## 2018-05-19 NOTE — Progress Notes (Signed)
Referring Provider: Fayrene Helper, MD Primary Care Physician:  Fayrene Helper, MD Primary GI:  Dr. Oneida Alar  Chief Complaint  Patient presents with  . Gastroesophageal Reflux    some better with Protonix  . Hemorrhoids    flared up after having diarrhea last week    HPI:   Martha Montgomery is a 37 y.o. female who presents for post procedure follow-up on constipation, GERD, dysphasia.  The patient was last seen by our office 09/09/2017 for GERD, constipation, dysphasia, bloating, rectal bleeding.  Noted history of IBS with constipation.  Previous colonoscopy in 2012 which found internal hemorrhoids and she was started on Linzess 145 mcg at a previous visit.  She took this for about 2 weeks and no improvement in her symptoms with a bowel movement every 4 days or so.  She could not afford the medication and then began having a bowel movement 1-2 times a week, straining.  Complains of rectal bleeding with almost every stool, no melena.  Postprandial epigastric discomfort, bloating, distention and regurgitation of food and liquid.  Solid food dysphagia intermittently but frequently.  Weight gain of 80 pounds since 2014 and 30 pounds and this was in the last 6 months.  Recommend to start Amitiza 24 mcg twice a day, colonoscopy and upper endoscopy with possible dilation.  Colonoscopy completed 10/20/2017 which found poor prep, tortuous left colon.  Recommended repeat colonoscopy in 1 day because of suboptimal prep.  EGD completed the same day found no endoscopic esophageal abnormality to explain dysphagia, esophagus dilated due to possible proximal esophageal web and symptoms most likely due to GERD.  Also noted mild NSAID gastritis.  Recommended continue current medications, clear liquid diet for repeat colonoscopy the next day, follow-up in 4 months.  This could be was reattempteFields, sd on 10/22/2017 which found normal examined ileum, significant looping of the colon, otherwise normal exam.   Rectal bleeding due to internal hemorrhoids.  Recommend repeat colonoscopy in 10 years for surveillance, continue weight loss effort, follow-up in 4 months.  Today she states she's doing well overall. Had a good experience with colonoscopy. She has had some persistent rectal bleeding related to hemorrhoids. Her doctor took her off Morphine ER to IR and has reduced the dose. She has had some bouts of diarrhea last week and subsequently had hemorrhoids flares. She uses Preparation H but feels her hemorrhoids are higher inside and doesn't feel that it can be reached; at times they protrude and she can feel them protrude against the skin.  Denies persistent abdominal pain, N/V, melena, fever, chills, unintentional weight loss.  She has lost about 15 lbs in the past 8 months. Has been making dietary changes due to bloating. She is going to the [facility] and walking more as well.  Past Medical History:  Diagnosis Date  . Anxiety   . Anxiety disorder   . Arthritis    "knees, joints, hands, toes" (09/28/2013)  . Depression   . Fibromyalgia   . Gastritis nov. 2011   EGD by Dr. Oneida Alar, negative h pylori  . GERD (gastroesophageal reflux disease)   . Gout   . Hypertension x 6 years   . Hypothyroidism   . Migraines    "15/month average" (09/28/2013)  . Spinal stenosis   . Thyroid goiter     Past Surgical History:  Procedure Laterality Date  . BIOPSY  10/20/2017   Procedure: BIOPSY;  Surgeon: Danie Binder, MD;  [facility];  Service:  Endoscopy;;  gastric duodenal esophagus  . CESAREAN SECTION  2008  . CHONDROPLASTY  04/02/2012   Procedure: CHONDROPLASTY;  Surgeon: Carole Civil, MD;  Location: [facility];  Service: Orthopedics;  Laterality: Left;  of left patella  . COLONOSCOPY  May 2012   Dr. Oneida Alar: normal colon, small internal hemorrhoids  . COLONOSCOPY WITH PROPOFOL N/A 10/22/2017   Procedure: COLONOSCOPY WITH PROPOFOL;  Surgeon: Danie Binder, MD;  [facility];  Service:  Endoscopy;  Laterality: N/A;  7:30am  . COLONOSCOPY WITH PROPOFOL N/A 10/20/2017   Procedure: COLONOSCOPY WITH PROPOFOL;  Surgeon: Danie Binder, MD;  [facility];  Service: Endoscopy;  Laterality: N/A;  11:00am  . ESOPHAGOGASTRODUODENOSCOPY (EGD) WITH PROPOFOL N/A 10/20/2017   Procedure: ESOPHAGOGASTRODUODENOSCOPY (EGD) WITH PROPOFOL;  Surgeon: Danie Binder, MD;  [facility];  Service: Endoscopy;  Laterality: N/A;  . INCISION AND DRAINAGE ABSCESS  1997; 2005   "under right buttocks; under left arm"  . SAVORY DILATION N/A 10/20/2017   Procedure: SAVORY DILATION;  Surgeon: Danie Binder, MD;  [facility];  Service: Endoscopy;  Laterality: N/A;  . THYROIDECTOMY N/A 09/28/2013   Procedure: TOTAL THYROIDECTOMY;  Surgeon: Ascencion Dike, MD;  Location: [place];  Service: ENT;  Laterality: N/A;  . TOTAL THYROIDECTOMY  09/27/2013  . UPPER GI ENDOSCOPY  2011   EGD: gastritis, no h.pylori    Current Outpatient Medications  Medication Sig Dispense Refill  . amLODipine (NORVASC) 2.5 MG tablet Take 1 tablet (2.5 mg total) by mouth daily. 90 tablet 3  . diphenhydrAMINE (BENADRYL) 25 MG tablet Take 25 mg by mouth daily.    . fluconazole (DIFLUCAN) 150 MG tablet One tablet once daily as needed , for vaginal itch associated with prolonged antibiotic use 2 tablet 0  . FLUoxetine (PROZAC) 20 MG tablet Take 1 tablet (20 mg total) by mouth daily. 30 tablet 5  . fluticasone (FLONASE) 50 MCG/ACT nasal spray Place 1 spray into both nostrils daily as needed for allergies or rhinitis.    . hydrochlorothiazide (MICROZIDE) 12.5 MG capsule Take 1 capsule (12.5 mg total) by mouth daily. 90 capsule 3  . levothyroxine (SYNTHROID, LEVOTHROID) 200 MCG tablet Take one and a half tablets daily before breakfast every Monday, Wednesday, Friday and Sunday, take one tablet every Tuesday, Thursday and Saturday 36 tablet 2  . lubiprostone (AMITIZA) 24 MCG capsule Take 1 capsule (24 mcg total) by mouth  2 (two) times daily with a meal. 60 capsule 5  . medroxyPROGESTERone (DEPO-PROVERA) 150 MG/ML injection Inject 150 mg into the muscle every 3 (three) months.    . metoprolol tartrate (LOPRESSOR) 50 MG tablet TAKE ONE TABLET BY MOUTH TWICE DAILY 180 tablet 1  . Morphine Sulfate ER 15 MG T12A Take 15 mg by mouth 2 (two) times daily.    . pantoprazole (PROTONIX) 40 MG tablet Take 1 tablet (40 mg total) by mouth daily. Take 30 minutes before breakfast (Patient taking differently: Take 40 mg by mouth daily as needed (pain). Take 30 minutes before breakfast) 30 tablet 3  . potassium chloride (K-DUR) 10 MEQ tablet Take 1 tablet (10 mEq total) by mouth daily. 30 tablet 3   No current facility-administered medications for this visit.     Allergies as of 05/19/2018 - Review Complete 05/19/2018  Allergen Reaction Noted  . Codeine Itching   . Ibuprofen Other (See Comments) 09/02/2016  . Soybean-containing drug products Other (See Comments) 12/10/2010    Family  History  Problem Relation Age of Onset  . Arthritis Mother   . Migraines Mother   . Hypertension Mother   . Cancer Maternal Grandmother        lung  . Asthma Unknown        family history   . Colon cancer Neg Hx     Social History   Socioeconomic History  . Marital status: Married    Spouse name: Not on file  . Number of children: 1  . Years of education: Not on file  . Highest education level: Not on file  Occupational History  . Occupation: Hospital doctor: UNEMPLOYED  Social Needs  . Financial resource strain: Somewhat hard  . Food insecurity:    Worry: Never true    Inability: Never true  . Transportation needs:    Medical: No    Non-medical: No  Tobacco Use  . Smoking status: Former Smoker    Packs/day: 1.00    Years: 5.00    Pack years: 5.00    Types: Cigarettes    Last attempt to quit: 09/11/2010    Years since quitting: 7.6  . Smokeless tobacco: Never Used    . Tobacco comment: QUIT SMOKING X 3 YEARS AGO  Substance and Sexual Activity  . Alcohol use: No  . Drug use: No  . Sexual activity: Not Currently    Partners: Male    Birth control/protection: Other-see comments, Injection    Comment: Depo  Lifestyle  . Physical activity:    Days per week: 2 days    Minutes per session: 20 min  . Stress: Not at all  Relationships  . Social connections:    Talks on phone: More than three times a week    Gets together: More than three times a week    Attends religious service: 1 to 4 times per year    Active member of club or organization: Yes    Attends meetings of clubs or organizations: 1 to 4 times per year    Relationship status: Married  Other Topics Concern  . Not on file  Social History Narrative   Pt is currently on medicaid. From [place] and came to [place] in mid 2010    Review of Systems: Complete ROS negative except as per HPI.   Physical Exam: BP (!) 146/87   Pulse (!) 110   Temp 98 F (36.7 C) (Oral)   Ht 5\' 3"  (1.6 m)   Wt 197 lb 3.2 oz (89.4 kg)   BMI 34.93 kg/m  General:   Alert and oriented. Pleasant and cooperative. Well-nourished and well-developed.  Eyes:  Without icterus, sclera clear and conjunctiva pink.  Ears:  Normal auditory acuity. Cardiovascular:  S1, S2 present without murmurs appreciated. Extremities without clubbing or edema. Respiratory:  Clear to auscultation bilaterally. No wheezes, rales, or rhonchi. No distress.  Gastrointestinal:  +BS, soft, non-tender and non-distended. No HSM noted. No guarding or rebound. No masses appreciated.  Rectal:  Deferred  Musculoskalatal:  Symmetrical without gross deformities. Neurologic:  Alert and oriented x4;  grossly normal neurologically. Psych:  Alert and cooperative. Normal mood and affect. Heme/Lymph/Immune: No excessive bruising noted.    05/19/2018 3:24 PM   Disclaimer: This note was dictated with voice recognition software. Similar sounding words can  inadvertently be transcribed and may not be corrected upon review.

## 2018-05-20 ENCOUNTER — Encounter: Payer: Self-pay | Admitting: Gastroenterology

## 2018-05-21 ENCOUNTER — Telehealth: Payer: Self-pay | Admitting: Family Medicine

## 2018-05-21 ENCOUNTER — Other Ambulatory Visit: Payer: Self-pay | Admitting: Family Medicine

## 2018-05-21 DIAGNOSIS — G894 Chronic pain syndrome: Secondary | ICD-10-CM

## 2018-05-21 NOTE — Assessment & Plan Note (Signed)
Hemorrhoids currently flared with recent diarrhea as per below and per HPI.  Over-the-counter agents are not effective at this time.  I sent in Anusol rectal cream to help with her hemorrhoid symptoms and her bright red blood per rectum.  Return for follow-up in 6 months.

## 2018-05-21 NOTE — Telephone Encounter (Signed)
Referral faxed, I tried to call the patient and let her know and did not get a answer.

## 2018-05-21 NOTE — Telephone Encounter (Signed)
I am asking for your help with this referral which OI entered. Please see the very detailed patient message which was sent to me  regarding location of Practices etc and try to facilitate the process, thanks

## 2018-05-21 NOTE — Assessment & Plan Note (Signed)
Rectal bleeding in the setting of a known hemorrhoids and new onset diarrhea with titration of pain medication while on Amitiza 24 mcg twice daily.  Further management of her constipation medication as per below.  Colonoscopy recently completed 10/20/2017 which is reassuring.  Rectal bleeding most certainly due to hemorrhoid flares.  Over-the-counter topical agents are not effective.  I will send in Anusol rectal cream for her that she can apply up to twice a day for up to 10 days at a time.  Call us with any worsening symptoms otherwise follow-up in 6 months.

## 2018-05-21 NOTE — Assessment & Plan Note (Signed)
The patient has been on Amitiza 24 mcg twice daily for opioid-induced constipation.  She has had some breakthrough diarrhea with titration of her pain medicines by the pain clinic.  I recommend that she reduce her Amitiza to 24 mcg once a day, or at once every other day to help prevent diarrhea.  Return for follow-up in 6 months.  Call if any worsening symptoms before then.

## 2018-05-24 NOTE — Progress Notes (Signed)
CC'ED TO PCP 

## 2018-05-28 DIAGNOSIS — M542 Cervicalgia: Secondary | ICD-10-CM | POA: Diagnosis not present

## 2018-05-28 DIAGNOSIS — M545 Low back pain: Secondary | ICD-10-CM | POA: Diagnosis not present

## 2018-05-28 DIAGNOSIS — Z8739 Personal history of other diseases of the musculoskeletal system and connective tissue: Secondary | ICD-10-CM | POA: Diagnosis not present

## 2018-05-28 DIAGNOSIS — M791 Myalgia, unspecified site: Secondary | ICD-10-CM | POA: Diagnosis not present

## 2018-05-31 ENCOUNTER — Ambulatory Visit: Payer: Self-pay | Admitting: "Endocrinology

## 2018-06-02 ENCOUNTER — Other Ambulatory Visit: Payer: Self-pay | Admitting: Family Medicine

## 2018-06-03 ENCOUNTER — Telehealth: Payer: Self-pay | Admitting: Family Medicine

## 2018-06-03 NOTE — Telephone Encounter (Signed)
Pls let her know pain center will not see her that she asked to go to, they reviewed her case and state they have nothing to offwer. I again recommend since Dr Merlene Laughter sees her she try to have him address this ( Letter form that  clinic Is at your station)

## 2018-06-08 ENCOUNTER — Encounter (HOSPITAL_COMMUNITY): Payer: Self-pay | Admitting: Emergency Medicine

## 2018-06-08 ENCOUNTER — Other Ambulatory Visit: Payer: Self-pay

## 2018-06-08 ENCOUNTER — Emergency Department (HOSPITAL_COMMUNITY)
Admission: EM | Admit: 2018-06-08 | Discharge: 2018-06-08 | Disposition: A | Discharge status: Home / Self Care | Payer: Medicare Other | Attending: Emergency Medicine | Admitting: Emergency Medicine

## 2018-06-08 DIAGNOSIS — Z87891 Personal history of nicotine dependence: Secondary | ICD-10-CM | POA: Insufficient documentation

## 2018-06-08 DIAGNOSIS — E039 Hypothyroidism, unspecified: Secondary | ICD-10-CM | POA: Diagnosis not present

## 2018-06-08 DIAGNOSIS — K047 Periapical abscess without sinus: Secondary | ICD-10-CM | POA: Insufficient documentation

## 2018-06-08 DIAGNOSIS — I10 Essential (primary) hypertension: Secondary | ICD-10-CM | POA: Diagnosis not present

## 2018-06-08 DIAGNOSIS — Z79899 Other long term (current) drug therapy: Secondary | ICD-10-CM | POA: Insufficient documentation

## 2018-06-08 DIAGNOSIS — K0889 Other specified disorders of teeth and supporting structures: Secondary | ICD-10-CM | POA: Diagnosis present

## 2018-06-08 MED ORDER — HYDROCODONE-ACETAMINOPHEN 5-325 MG PO TABS: 1.0000 | ORAL_TABLET | Freq: Four times a day (QID) | ORAL | 0 refills | Status: DC | PRN

## 2018-06-08 MED ORDER — PENICILLIN V POTASSIUM 250 MG PO TABS
500.0000 mg | ORAL_TABLET | Freq: Once | ORAL | Status: AC
  Administered 2018-06-08: 500 mg via ORAL
  Filled 2018-06-08: qty 2

## 2018-06-08 MED ORDER — PENICILLIN V POTASSIUM 500 MG PO TABS: 500.0000 mg | ORAL_TABLET | Freq: Three times a day (TID) | ORAL | 0 refills | Status: DC

## 2018-06-08 NOTE — Discharge Instructions (Addendum)
Please use the provided resources to ensure follow-up with a dentist this week.  Take all medication as directed and do not hesitate to return here for concerning changes in your condition.  In addition to the prescribed medication, please use Listerine antiseptic wash.

## 2018-06-08 NOTE — ED Provider Notes (Signed)
[facility] EMERGENCY DEPARTMENT Provider Note   CSN: [id number] Arrival date & time: 06/08/18  1836     History   Chief Complaint Chief Complaint  Patient presents with  . Dental Pain    HPI Martha Montgomery is a 37 y.o. female.  HPI Patient presents with concern of pain and swelling in the right lower face. Onset was earlier today, though she notes poor dentition, with no recent dental care. Since onset she has had increasing pain, swelling, discomfort throughout the right lower jaw. No difficulty swallowing, speaking, no fever, no vomiting. No medication taken for pain relief   Past Medical History:  Diagnosis Date  . Anxiety   . Anxiety disorder   . Arthritis    "knees, joints, hands, toes" (09/28/2013)  . Depression   . Fibromyalgia   . Gastritis nov. 2011   EGD by Dr. Oneida Alar, negative h pylori  . GERD (gastroesophageal reflux disease)   . Gout   . Hypertension x 6 years   . Hypothyroidism   . Migraines    "15/month average" (09/28/2013)  . Spinal stenosis   . Thyroid goiter     Patient Active Problem List   Diagnosis Date Noted  . Hemorrhoids 05/19/2018  . Contraception 03/20/2018  . Low vitamin D level 11/30/2017  . Esophageal dysphagia 09/09/2017  . Bloating 03/05/2017  . Bilateral knee pain 01/05/2017  . Amenorrhea 01/05/2017  . Bilateral hip pain 07/22/2016  . Morbid obesity ([place]) 06/30/2015  . Post-surgical hypothyroidism 06/02/2014  . Need for influenza vaccination 06/02/2014  . HTN (hypertension), benign 06/30/2012  . Knee pain 06/02/2012  . Patellar malalignment syndrome 03/09/2012  . IBS (irritable bowel syndrome) 08/29/2011  . GERD (gastroesophageal reflux disease) 08/29/2011  . Rectal bleeding 12/10/2010  . BACK PAIN 09/24/2010  . Migraine with aura 04/02/2010  . Constipation 09/12/2009  . Allergic rhinitis 06/16/2009  . Anxiety and depression 10/18/2008    Past Surgical History:  Procedure Laterality Date  . BIOPSY  10/20/2017   Procedure: BIOPSY;  Surgeon: Danie Binder, MD;  Location: [facility];  Service: Endoscopy;;  gastric duodenal esophagus  . CESAREAN SECTION  2008  . CHONDROPLASTY  04/02/2012   Procedure: CHONDROPLASTY;  Surgeon: Carole Civil, MD;  Location: [facility];  Service: Orthopedics;  Laterality: Left;  of left patella  . COLONOSCOPY  May 2012   Dr. Oneida Alar: normal colon, small internal hemorrhoids  . COLONOSCOPY WITH PROPOFOL N/A 10/22/2017   Procedure: COLONOSCOPY WITH PROPOFOL;  Surgeon: Danie Binder, MD;  Location: [facility];  Service: Endoscopy;  Laterality: N/A;  7:30am  . COLONOSCOPY WITH PROPOFOL N/A 10/20/2017   Procedure: COLONOSCOPY WITH PROPOFOL;  Surgeon: Danie Binder, MD;  Location: [facility];  Service: Endoscopy;  Laterality: N/A;  11:00am  . ESOPHAGOGASTRODUODENOSCOPY (EGD) WITH PROPOFOL N/A 10/20/2017   Procedure: ESOPHAGOGASTRODUODENOSCOPY (EGD) WITH PROPOFOL;  Surgeon: Danie Binder, MD;  Location: [facility];  Service: Endoscopy;  Laterality: N/A;  . INCISION AND DRAINAGE ABSCESS  1997; 2005   "under right buttocks; under left arm"  . SAVORY DILATION N/A 10/20/2017   Procedure: SAVORY DILATION;  Surgeon: Danie Binder, MD;  Location: [facility];  Service: Endoscopy;  Laterality: N/A;  . THYROIDECTOMY N/A 09/28/2013   Procedure: TOTAL THYROIDECTOMY;  Surgeon: Ascencion Dike, MD;  Location: [facility];  Service: ENT;  Laterality: N/A;  . TOTAL THYROIDECTOMY  09/27/2013  . UPPER GI ENDOSCOPY  2011   EGD: gastritis, no  h.pylori     OB History   None      Home Medications    Prior to Admission medications   Medication Sig Start Date End Date Taking? Authorizing Provider  amLODipine (NORVASC) 2.5 MG tablet Take 1 tablet (2.5 mg total) by mouth daily. 11/11/17   Fayrene Helper, MD  diphenhydrAMINE (BENADRYL) 25 MG tablet Take 25 mg by mouth daily.    [provider]  fluconazole (DIFLUCAN) 150 MG tablet One tablet once daily as needed , for vaginal  itch associated with prolonged antibiotic use 01/04/18   Fayrene Helper, MD  FLUoxetine (PROZAC) 20 MG tablet Take 1 tablet (20 mg total) by mouth daily. 03/16/18   Fayrene Helper, MD  fluticasone (FLONASE) 50 MCG/ACT nasal spray Place 1 spray into both nostrils daily as needed for allergies or rhinitis.    [provider]  hydrochlorothiazide (MICROZIDE) 12.5 MG capsule Take 1 capsule (12.5 mg total) by mouth daily. 11/11/17   Fayrene Helper, MD  HYDROcodone-acetaminophen (NORCO/VICODIN) 5-325 MG tablet Take 1 tablet by mouth every 6 (six) hours as needed for severe pain. 06/08/18   Carmin Muskrat, MD  hydrocortisone (ANUSOL-HC) 2.5 % rectal cream Place 1 application rectally 2 (two) times daily. for up to 10 days at a time. 05/19/18   Carlis Stable, NP  levothyroxine (SYNTHROID, LEVOTHROID) 200 MCG tablet TAKE 1 TABLET BY MOUTH EVERY DAY BEFORE BREAKFAST 06/04/18   Fayrene Helper, MD  lubiprostone (AMITIZA) 24 MCG capsule Take 1 capsule (24 mcg total) by mouth 2 (two) times daily with a meal. 05/19/18   Carlis Stable, NP  medroxyPROGESTERone (DEPO-PROVERA) 150 MG/ML injection Inject 150 mg into the muscle every 3 (three) months.    [provider]  metoprolol tartrate (LOPRESSOR) 50 MG tablet TAKE ONE TABLET BY MOUTH TWICE DAILY 06/02/17   Fayrene Helper, MD  Morphine Sulfate ER 15 MG T12A Take 15 mg by mouth 2 (two) times daily. 01/18/18   [provider]  pantoprazole (PROTONIX) 40 MG tablet Take 1 tablet (40 mg total) by mouth daily. Take 30 minutes before breakfast Patient taking differently: Take 40 mg by mouth daily as needed (pain). Take 30 minutes before breakfast 03/05/17   Annitta Needs, NP  penicillin v potassium (VEETID) 500 MG tablet Take 1 tablet (500 mg total) by mouth 3 (three) times daily for 7 days. 06/08/18 06/15/18  Carmin Muskrat, MD  potassium chloride (K-DUR) 10 MEQ tablet Take 1 tablet (10 mEq total) by mouth daily. 07/22/16    Fayrene Helper, MD    Family History Family History  Problem Relation Age of Onset  . Arthritis Mother   . Migraines Mother   . Hypertension Mother   . Cancer Maternal Grandmother        lung  . Asthma Unknown        family history   . Colon cancer Neg Hx     Social History Social History   Tobacco Use  . Smoking status: Former Smoker    Packs/day: 1.00    Years: 5.00    Pack years: 5.00    Types: Cigarettes    Last attempt to quit: 09/11/2010    Years since quitting: 7.7  . Smokeless tobacco: Never Used  . Tobacco comment: QUIT SMOKING X 3 YEARS AGO  Substance Use Topics  . Alcohol use: No  . Drug use: No     Allergies   Codeine; Ibuprofen; and Soybean-containing drug  products   Review of Systems Review of Systems  Constitutional: Negative for fever.  Respiratory: Negative for shortness of breath.   Cardiovascular: Negative for chest pain.  Musculoskeletal:       Negative aside from HPI  Skin:       Negative aside from HPI  Allergic/Immunologic: Negative for immunocompromised state.  Neurological: Negative for weakness.     Physical Exam Updated Vital Signs BP 121/87 (BP Location: Right Arm)   Pulse 80   Temp 97.7 F (36.5 C) (Oral)   Resp 17   SpO2 99%   Physical Exam  Constitutional: She is oriented to person, place, and time. She appears well-developed and well-nourished. No distress.  HENT:  Head: Normocephalic and atraumatic.  Mouth/Throat:    Eyes: Conjunctivae and EOM are normal.  Cardiovascular: Normal rate and regular rhythm.  Pulmonary/Chest: Effort normal.  Neurological: She is alert and oriented to person, place, and time. No cranial nerve deficit.  Skin: Skin is warm and dry.  Psychiatric: She has a normal mood and affect.  Nursing note and vitals reviewed.    ED Treatments / Results   Procedures Procedures (including critical care time)  Medications Ordered in ED Medications  penicillin v potassium (VEETID)  tablet 500 mg (has no administration in time range)     Initial Impression / Assessment and Plan / ED Course  I have reviewed the triage vital signs and the nursing notes.  Pertinent labs & imaging results that were available during my care of the patient were reviewed by me and considered in my medical decision making (see chart for details).  Well-appearing young female presents with signs and symptoms of dental infection. No evidence for bacteremia, sepsis, respiratory compromise. Patient started on antibiotics, provided additional resources for dental follow-up.  Final Clinical Impressions(s) / ED Diagnoses   Final diagnoses:  Dental infection    ED Discharge Orders         Ordered    penicillin v potassium (VEETID) 500 MG tablet  3 times daily     06/08/18 1917    HYDROcodone-acetaminophen (NORCO/VICODIN) 5-325 MG tablet  Every 6 hours PRN,   Status:  Discontinued     06/08/18 1917    HYDROcodone-acetaminophen (NORCO/VICODIN) 5-325 MG tablet  Every 6 hours PRN     06/08/18 1917           Carmin Muskrat, MD 06/08/18 1921

## 2018-06-08 NOTE — Telephone Encounter (Signed)
Pt aware.

## 2018-06-08 NOTE — ED Triage Notes (Signed)
Pt states tooth broke off to lower right today.

## 2018-06-10 ENCOUNTER — Ambulatory Visit (INDEPENDENT_AMBULATORY_CARE_PROVIDER_SITE_OTHER): Payer: Medicare Other | Admitting: Family Medicine

## 2018-06-10 ENCOUNTER — Encounter: Payer: Self-pay | Admitting: Family Medicine

## 2018-06-10 VITALS — BP 118/84 | HR 84 | Resp 12 | Ht 63.0 in | Wt 198.0 lb

## 2018-06-10 DIAGNOSIS — F329 Major depressive disorder, single episode, unspecified: Secondary | ICD-10-CM

## 2018-06-10 DIAGNOSIS — G8929 Other chronic pain: Secondary | ICD-10-CM

## 2018-06-10 DIAGNOSIS — R519 Headache, unspecified: Secondary | ICD-10-CM | POA: Insufficient documentation

## 2018-06-10 DIAGNOSIS — G43101 Migraine with aura, not intractable, with status migrainosus: Secondary | ICD-10-CM

## 2018-06-10 DIAGNOSIS — Z30019 Encounter for initial prescription of contraceptives, unspecified: Secondary | ICD-10-CM | POA: Diagnosis not present

## 2018-06-10 DIAGNOSIS — R51 Headache: Secondary | ICD-10-CM

## 2018-06-10 DIAGNOSIS — M5442 Lumbago with sciatica, left side: Secondary | ICD-10-CM

## 2018-06-10 DIAGNOSIS — F419 Anxiety disorder, unspecified: Secondary | ICD-10-CM

## 2018-06-10 DIAGNOSIS — I10 Essential (primary) hypertension: Secondary | ICD-10-CM

## 2018-06-10 MED ORDER — MEDROXYPROGESTERONE ACETATE 150 MG/ML IM SUSP
150.0000 mg | Freq: Once | INTRAMUSCULAR | Status: AC
  Administered 2018-06-10: 150 mg via INTRAMUSCULAR

## 2018-06-10 NOTE — Patient Instructions (Addendum)
F/u in 4 months, call if you need me before  Depo provera 150 mg IM  Blood pressure is good, congrats on weight loss , keep it up  It is important that you exercise regularly at least 30 minutes 5 times a week. If you develop chest pain, have severe difficulty breathing, or feel very tired, stop exercising immediately and seek medical attention    Fasting lipid, cmp and EGFr, hBA1C, TSH   You are referred to Dr Merlene Laughter as soon as  I hear from the office I will let you know,

## 2018-06-10 NOTE — Assessment & Plan Note (Addendum)
Uncontrolled migraine headaches needs to return to Neurology for management, referrals entered

## 2018-06-11 ENCOUNTER — Encounter: Payer: Self-pay | Admitting: Family Medicine

## 2018-06-11 NOTE — Assessment & Plan Note (Signed)
Reports chronic back and lower extremity pain being uncontrolled as currently not on any prescription medication for this, trying to see if prior neurologist/ pain Doc is willing to take her back as his patient as recent pain clinic she was going to has been closed

## 2018-06-11 NOTE — Assessment & Plan Note (Addendum)
Need for depo provera, same administered 150 mg IM , with no adverse local reaction

## 2018-06-11 NOTE — Assessment & Plan Note (Signed)
Controlled, no change in medication  

## 2018-06-11 NOTE — Progress Notes (Signed)
Martha Montgomery     MRN: [id number]      DOB: 03/12/1981   HPI Martha Montgomery is here for follow up and re-evaluation of chronic medical conditions, medication management and review of any available recent lab and radiology data.  Preventive health is updated, specifically  Cancer screening and Immunization.   Questions or concerns regarding consultations or procedures which the PT has had in the interim are  addressed. The PT denies any adverse reactions to current medications since the last visit.  C/o uncontrolled/ untreated back pain and headaches , trying to establish with a Provider  ROS Denies recent fever or chills. Denies sinus pressure, nasal congestion, ear pain or sore throat. Denies chest congestion, productive cough or wheezing. Denies chest pains, palpitations and leg swelling Denies abdominal pain, nausea, vomiting,diarrhea or constipation.   Denies dysuria, frequency, hesitancy or incontinence.  Denies depression, anxiety or insomnia. Denies skin break down or rash.   PE  BP 118/84 (BP Location: Right Arm, Patient Position: Sitting, Cuff Size: Large)   Pulse 84   Resp 12   Ht 5\' 3"  (1.6 m)   Wt 198 lb 0.6 oz (89.8 kg)   SpO2 97% Comment: room air  BMI 35.08 kg/m   Patient alert and oriented and in no cardiopulmonary distress.  HEENT: No facial asymmetry, EOMI,   oropharynx pink and moist.  Neck supple no JVD, no mass.  Chest: Clear to auscultation bilaterally.  CVS: S1, S2 no murmurs, no S3.Regular rate.  ABD: Soft non tender.   Ext: No edema  MS: Adequate ROM spine, shoulders, hips and knees.  Skin: Intact, no ulcerations or rash noted.  Psych: Good eye contact, normal affect. Memory intact not anxious or depressed appearing.  CNS: CN 2-12 intact, power,  normal throughout.no focal deficits noted.   Assessment & Plan  Migraine headache Uncontrolled migraine headaches needs to return to Neurology for management, referrals entered  Encounter for  contraceptive management Need for depo provera, same administered 150 mg IM , with no adverse local reaction  HTN (hypertension), benign Controlled, no change in medication DASH diet and commitment to daily physical activity for a minimum of 30 minutes discussed and encouraged, as a part of hypertension management. The importance of attaining a healthy weight is also discussed.  BP/Weight 06/10/2018 06/08/2018 05/19/2018 05/10/2018 03/16/2018 12/30/2017 2/70/6237  Systolic BP 628 315 176 160 737 106 269  Diastolic BP 84 87 87 82 84 87 80  Wt. (Lbs) 198.04 - 197.2 190 198.04 200 207  BMI 35.08 - 34.93 33.66 35.08 35.43 36.67       Morbid obesity (HCC) Unchanged since last  Visit, but moving in the right direction since start of this year Patient re-educated about  the importance of commitment to a  minimum of 150 minutes of exercise per week.  The importance of healthy food choices with portion control discussed. Encouraged to start a food diary, count calories and to consider  joining a support group. Sample diet sheets offered. Goals set by the patient for the next several months.   Weight /BMI 06/10/2018 05/19/2018 05/10/2018  WEIGHT 198 lb 0.6 oz 197 lb 3.2 oz 190 lb  HEIGHT 5\' 3"  5\' 3"  5\' 3"   BMI 35.08 kg/m2 34.93 kg/m2 33.66 kg/m2      Anxiety and depression Controlled, no change in medication   Back pain Reports chronic back and lower extremity pain being uncontrolled as currently not on any prescription medication for this, trying to see  if prior neurologist/ pain Doc is willing to take her back as his patient as recent pain clinic she was going to has been closed

## 2018-06-11 NOTE — Assessment & Plan Note (Signed)
Controlled, no change in medication DASH diet and commitment to daily physical activity for a minimum of 30 minutes discussed and encouraged, as a part of hypertension management. The importance of attaining a healthy weight is also discussed.  BP/Weight 06/10/2018 06/08/2018 05/19/2018 05/10/2018 03/16/2018 12/30/2017 9/44/7395  Systolic BP 844 171 278 718 367 255 001  Diastolic BP 84 87 87 82 84 87 80  Wt. (Lbs) 198.04 - 197.2 190 198.04 200 207  BMI 35.08 - 34.93 33.66 35.08 35.43 36.67

## 2018-06-11 NOTE — Assessment & Plan Note (Signed)
Unchanged since last  Visit, but moving in the right direction since start of this year Patient re-educated about  the importance of commitment to a  minimum of 150 minutes of exercise per week.  The importance of healthy food choices with portion control discussed. Encouraged to start a food diary, count calories and to consider  joining a support group. Sample diet sheets offered. Goals set by the patient for the next several months.   Weight /BMI 06/10/2018 05/19/2018 05/10/2018  WEIGHT 198 lb 0.6 oz 197 lb 3.2 oz 190 lb  HEIGHT 5\' 3"  5\' 3"  5\' 3"   BMI 35.08 kg/m2 34.93 kg/m2 33.66 kg/m2

## 2018-06-14 ENCOUNTER — Telehealth: Payer: Self-pay

## 2018-06-14 DIAGNOSIS — R7301 Impaired fasting glucose: Secondary | ICD-10-CM

## 2018-06-14 DIAGNOSIS — E039 Hypothyroidism, unspecified: Secondary | ICD-10-CM

## 2018-06-14 DIAGNOSIS — I10 Essential (primary) hypertension: Secondary | ICD-10-CM

## 2018-06-14 NOTE — Telephone Encounter (Signed)
Labs ordered.

## 2018-06-28 ENCOUNTER — Ambulatory Visit: Payer: Self-pay

## 2018-07-07 ENCOUNTER — Encounter: Payer: Self-pay | Admitting: Family Medicine

## 2018-07-08 ENCOUNTER — Encounter: Payer: Self-pay | Admitting: *Deleted

## 2018-07-08 ENCOUNTER — Telehealth: Payer: Self-pay | Admitting: Family Medicine

## 2018-07-08 NOTE — Telephone Encounter (Signed)
Pls see message she sent me, now asking for referral to facility in [place], pls send her request to that facility,states she has not heard from Dr Freddie Apley office and he said he would accept her, thanks

## 2018-07-13 DIAGNOSIS — M25361 Other instability, right knee: Secondary | ICD-10-CM | POA: Diagnosis not present

## 2018-07-13 DIAGNOSIS — M25362 Other instability, left knee: Secondary | ICD-10-CM | POA: Diagnosis not present

## 2018-08-03 DIAGNOSIS — E039 Hypothyroidism, unspecified: Secondary | ICD-10-CM | POA: Diagnosis not present

## 2018-08-03 DIAGNOSIS — I10 Essential (primary) hypertension: Secondary | ICD-10-CM | POA: Diagnosis not present

## 2018-08-03 DIAGNOSIS — R7301 Impaired fasting glucose: Secondary | ICD-10-CM | POA: Diagnosis not present

## 2018-08-04 ENCOUNTER — Encounter: Payer: Self-pay | Admitting: Family Medicine

## 2018-08-04 LAB — COMPLETE METABOLIC PANEL WITH GFR
AG Ratio: 1.9 (calc) (ref 1.0–2.5)
ALBUMIN MSPROF: 4.8 g/dL (ref 3.6–5.1)
ALKALINE PHOSPHATASE (APISO): 63 U/L (ref 33–115)
ALT: 11 U/L (ref 6–29)
AST: 15 U/L (ref 10–30)
BUN / CREAT RATIO: 6 (calc) (ref 6–22)
BUN: 5 mg/dL — AB (ref 7–25)
CO2: 27 mmol/L (ref 20–32)
CREATININE: 0.81 mg/dL (ref 0.50–1.10)
Calcium: 9.2 mg/dL (ref 8.6–10.2)
Chloride: 105 mmol/L (ref 98–110)
GFR, Est African American: 108 mL/min/{1.73_m2} (ref >=60)
GFR, Est Non African American: 93 mL/min/{1.73_m2} (ref >=60)
GLUCOSE: 83 mg/dL (ref 65–99)
Globulin: 2.5 g/dL (calc) (ref 1.9–3.7)
Potassium: 4.1 mmol/L (ref 3.5–5.3)
Sodium: 141 mmol/L (ref 135–146)
Total Bilirubin: 0.4 mg/dL (ref 0.2–1.2)
Total Protein: 7.3 g/dL (ref 6.1–8.1)

## 2018-08-04 LAB — LIPID PANEL
CHOL/HDL RATIO: 4.7 (calc) (ref <5.0)
CHOLESTEROL: 240 mg/dL — AB (ref <200)
HDL: 51 mg/dL (ref >50)
LDL Cholesterol (Calc): 166 mg/dL (calc) — ABNORMAL HIGH
Non-HDL Cholesterol (Calc): 189 mg/dL (calc) — ABNORMAL HIGH (ref <130)
Triglycerides: 109 mg/dL (ref <150)

## 2018-08-04 LAB — HEMOGLOBIN A1C
EAG (MMOL/L): 6.3 (calc)
Hgb A1c MFr Bld: 5.6 % of total Hgb (ref <5.7)
MEAN PLASMA GLUCOSE: 114 (calc)

## 2018-08-04 LAB — TSH: TSH: 15.64 m[IU]/L — AB

## 2018-08-12 ENCOUNTER — Other Ambulatory Visit: Payer: Self-pay

## 2018-08-12 ENCOUNTER — Ambulatory Visit (INDEPENDENT_AMBULATORY_CARE_PROVIDER_SITE_OTHER): Payer: Medicare Other

## 2018-08-12 VITALS — BP 125/88 | HR 81 | Resp 12 | Ht 63.0 in | Wt 194.0 lb

## 2018-08-12 DIAGNOSIS — Z0000 Encounter for general adult medical examination without abnormal findings: Secondary | ICD-10-CM

## 2018-08-12 DIAGNOSIS — I10 Essential (primary) hypertension: Secondary | ICD-10-CM

## 2018-08-12 MED ORDER — AMLODIPINE BESYLATE 2.5 MG PO TABS: 2.5000 mg | ORAL_TABLET | Freq: Every day | ORAL | 3 refills | Status: DC

## 2018-08-12 MED ORDER — METOPROLOL TARTRATE 50 MG PO TABS: 50.0000 mg | ORAL_TABLET | Freq: Two times a day (BID) | ORAL | 1 refills | Status: DC

## 2018-08-12 MED ORDER — HYDROCHLOROTHIAZIDE 12.5 MG PO CAPS: 12.5000 mg | ORAL_CAPSULE | Freq: Every day | ORAL | 3 refills | Status: DC

## 2018-08-12 NOTE — Progress Notes (Signed)
Subjective:   [place] is a 38 y.o. female who presents for Medicare Annual (Subsequent) preventive examination.  Review of Systems:   Cardiac Risk Factors include: obesity (BMI >30kg/m2);sedentary lifestyle;smoking/ tobacco exposure;hypertension     Objective:     Vitals: BP 125/88   Pulse 81   Resp 12   Ht 5\' 3"  (1.6 m)   Wt 194 lb (88 kg)   SpO2 96%   BMI 34.37 kg/m   Body mass index is 34.37 kg/m.  Advanced Directives 08/12/2018 06/08/2018 05/10/2018 12/30/2017 10/22/2017 10/20/2017 10/13/2017  Does Patient Have a Medical Advance Directive? Yes No No No No No No  Does patient want to make changes to medical advance directive? Yes (ED - Information included in AVS) - - - - - -  Would patient like information on creating a medical advance directive? - - - No - Patient declined No - Patient declined No - Patient declined No - Patient declined  Pre-existing out of facility DNR order (yellow form or pink MOST form) - - - - - - -    Tobacco Social History   Tobacco Use  Smoking Status Former Smoker  . Packs/day: 1.00  . Years: 5.00  . Pack years: 5.00  . Types: Cigarettes  . Last attempt to quit: 09/11/2010  . Years since quitting: 7.9  Smokeless Tobacco Never Used  Tobacco Comment   QUIT SMOKING X 3 YEARS AGO     Counseling given: Not Answered Comment: QUIT SMOKING X 3 YEARS AGO   Clinical Intake:  Pre-visit preparation completed: Yes  Pain : 0-10 Pain Score: 5  Pain Type: Chronic pain Pain Location: Back Pain Orientation: Lower Pain Radiating Towards: Neck  Pain Descriptors / Indicators: Aching Pain Onset: More than a month ago Pain Frequency: Constant Pain Relieving Factors: hydrocodone   Pain Relieving Factors: hydrocodone   BMI - recorded: 34.37 Nutritional Status: BMI > 30  Obese Nutritional Risks: None Diabetes: No  How often do you need to have someone help you when you read instructions, pamphlets, or other written materials from your  doctor or pharmacy?: 1 - Never What is the last grade level you completed in school?: 12  Interpreter Needed?: No  Information entered by :: Francena Hanly LPN   Past Medical History:  Diagnosis Date  . Anxiety   . Anxiety disorder   . Arthritis    "knees, joints, hands, toes" (09/28/2013)  . Depression   . Fibromyalgia   . Gastritis nov. 2011   EGD by Dr. Oneida Alar, negative h pylori  . GERD (gastroesophageal reflux disease)   . Gout   . Hypertension x 6 years   . Hypothyroidism   . Migraines    "15/month average" (09/28/2013)  . Spinal stenosis   . Thyroid goiter    Past Surgical History:  Procedure Laterality Date  . BIOPSY  10/20/2017   Procedure: BIOPSY;  Surgeon: Danie Binder, MD;  [facility];  Service: Endoscopy;;  gastric duodenal esophagus  . CESAREAN SECTION  2008  . CHONDROPLASTY  04/02/2012   Procedure: CHONDROPLASTY;  Surgeon: Carole Civil, MD;  Location: [facility];  Service: Orthopedics;  Laterality: Left;  of left patella  . COLONOSCOPY  May 2012   Dr. Oneida Alar: normal colon, small internal hemorrhoids  . COLONOSCOPY WITH PROPOFOL N/A 10/22/2017   Procedure: COLONOSCOPY WITH PROPOFOL;  Surgeon: Danie Binder, MD;  [facility];  Service: Endoscopy;  Laterality: N/A;  7:30am  .  COLONOSCOPY WITH PROPOFOL N/A 10/20/2017   Procedure: COLONOSCOPY WITH PROPOFOL;  Surgeon: Danie Binder, MD;  [facility];  Service: Endoscopy;  Laterality: N/A;  11:00am  . ESOPHAGOGASTRODUODENOSCOPY (EGD) WITH PROPOFOL N/A 10/20/2017   Procedure: ESOPHAGOGASTRODUODENOSCOPY (EGD) WITH PROPOFOL;  Surgeon: Danie Binder, MD;  [facility];  Service: Endoscopy;  Laterality: N/A;  . INCISION AND DRAINAGE ABSCESS  1997; 2005   "under right buttocks; under left arm"  . SAVORY DILATION N/A 10/20/2017   Procedure: SAVORY DILATION;  Surgeon: Danie Binder, MD;  [facility]: [facility];  Service: Endoscopy;  Laterality: N/A;  . THYROIDECTOMY N/A  09/28/2013   Procedure: TOTAL THYROIDECTOMY;  Surgeon: Ascencion Dike, MD;  Location: [place];  Service: ENT;  Laterality: N/A;  . TOTAL THYROIDECTOMY  09/27/2013  . UPPER GI ENDOSCOPY  2011   EGD: gastritis, no h.pylori   Family History  Problem Relation Age of Onset  . Arthritis Mother   . Migraines Mother   . Hypertension Mother   . Cancer Maternal Grandmother        lung  . Asthma Other        family history   . Colon cancer Neg Hx    Social History   Socioeconomic History  . Marital status: Legally Separated    Spouse name: Not on file  . Number of children: 1  . Years of education: 39  . Highest education level: 12th grade  Occupational History  . Occupation: Hospital doctor: UNEMPLOYED  Social Needs  . Financial resource strain: Somewhat hard  . Food insecurity:    Worry: Sometimes true    Inability: Sometimes true  . Transportation needs:    Medical: No    Non-medical: No  Tobacco Use  . Smoking status: Former Smoker    Packs/day: 1.00    Years: 5.00    Pack years: 5.00    Types: Cigarettes    Last attempt to quit: 09/11/2010    Years since quitting: 7.9  . Smokeless tobacco: Never Used  . Tobacco comment: QUIT SMOKING X 3 YEARS AGO  Substance and Sexual Activity  . Alcohol use: No  . Drug use: No  . Sexual activity: Not Currently    Partners: Male    Birth control/protection: Other-see comments, Injection    Comment: Depo  Lifestyle  . Physical activity:    Days per week: 2 days    Minutes per session: 20 min  . Stress: Only a little  Relationships  . Social connections:    Talks on phone: More than three times a week    Gets together: More than three times a week    Attends religious service: 1 to 4 times per year    Active member of club or organization: Yes    Attends meetings of clubs or organizations: 1 to 4 times per year    Relationship status: Married  Other Topics Concern  . Not on file    Social History Narrative   Pt is currently on medicaid. From [place] and came to [place] in mid 2010   Is separated from Husband, lives with Mother right now     Outpatient Encounter Medications as of 08/12/2018  Medication Sig  . diphenhydrAMINE (BENADRYL) 25 MG tablet Take 25 mg by mouth daily.  . fluconazole (DIFLUCAN) 150 MG tablet One tablet once daily as needed , for vaginal itch associated with prolonged antibiotic use  .  FLUoxetine (PROZAC) 20 MG tablet Take 1 tablet (20 mg total) by mouth daily.  . fluticasone (FLONASE) 50 MCG/ACT nasal spray Place 1 spray into both nostrils daily as needed for allergies or rhinitis.  Marland Kitchen HYDROcodone-acetaminophen (NORCO/VICODIN) 5-325 MG tablet Take 1 tablet by mouth every 6 (six) hours as needed for severe pain.  . hydrocortisone (ANUSOL-HC) 2.5 % rectal cream Place 1 application rectally 2 (two) times daily. for up to 10 days at a time.  Marland Kitchen levothyroxine (SYNTHROID, LEVOTHROID) 200 MCG tablet TAKE 1 TABLET BY MOUTH EVERY DAY BEFORE BREAKFAST  . lubiprostone (AMITIZA) 24 MCG capsule Take 1 capsule (24 mcg total) by mouth 2 (two) times daily with a meal.  . medroxyPROGESTERone (DEPO-PROVERA) 150 MG/ML injection Inject 150 mg into the muscle every 3 (three) months.  . pantoprazole (PROTONIX) 40 MG tablet Take 1 tablet (40 mg total) by mouth daily. Take 30 minutes before breakfast (Patient taking differently: Take 40 mg by mouth daily as needed (pain). Take 30 minutes before breakfast)  . potassium chloride (K-DUR) 10 MEQ tablet Take 1 tablet (10 mEq total) by mouth daily.  . pregabalin (LYRICA) 100 MG capsule Take 100 mg by mouth 3 (three) times daily.  . [DISCONTINUED] amLODipine (NORVASC) 2.5 MG tablet Take 1 tablet (2.5 mg total) by mouth daily.  . [DISCONTINUED] hydrochlorothiazide (MICROZIDE) 12.5 MG capsule Take 1 capsule (12.5 mg total) by mouth daily.  . [DISCONTINUED] metoprolol tartrate (LOPRESSOR) 50 MG tablet TAKE ONE TABLET BY MOUTH TWICE DAILY   No  facility-administered encounter medications on file as of 08/12/2018.     Activities of Daily Living In your present state of health, do you have any difficulty performing the following activities: 08/12/2018 10/13/2017  Hearing? N N  Vision? N N  Difficulty concentrating or making decisions? Y N  Walking or climbing stairs? Y Y  Dressing or bathing? N N  Doing errands, shopping? N N  Preparing Food and eating ? N -  Using the Toilet? N -  In the past six months, have you accidently leaked urine? N -  Do you have problems with loss of bowel control? N -  Managing your Medications? N -  Managing your Finances? N -  Housekeeping or managing your Housekeeping? N -  Some recent data might be hidden    Patient Care Team: Fayrene Helper, MD as PCP - General Danie Binder, MD (Gastroenterology)    Assessment:   This is a routine wellness examination for [place].  Exercise Activities and Dietary recommendations Current Exercise Habits: The patient does not participate in regular exercise at present, Exercise limited by: orthopedic condition(s)  Goals    . DIET - INCREASE WATER INTAKE    . Increase physical activity    . Patient Stated     I want to start a plant based diet        Fall Risk Fall Risk  08/12/2018 06/10/2018 03/16/2018 06/22/2017 05/27/2016  Falls in the past year? 1 1 No No Yes  Number falls in past yr: 0 1 - - 1  Injury with Fall? 1 1 - - -  Risk for fall due to : Medication side effect;History of fall(s) History of fall(s) - - -  Follow up Falls prevention discussed - - - -   Is the patient's home free of loose throw rugs in walkways, pet beds, electrical cords, etc?   yes      Grab bars in the bathroom? no  Handrails on the stairs?   yes      Adequate lighting?   yes  Timed Get Up and Go performed: Patient able to perform in 5 seconds without assistance   Depression Screen PHQ 2/9 Scores 08/12/2018 06/10/2018 03/16/2018 03/16/2018  PHQ - 2 Score 1 1  3 1   PHQ- 9 Score 7 - 16 -     Cognitive Function     6CIT Screen 08/12/2018 06/22/2017  What Year? 0 points 0 points  What month? 0 points 0 points  What time? 3 points 0 points  Count back from 20 0 points 0 points  Months in reverse 0 points 0 points  Repeat phrase 0 points 0 points  Total Score 3 0    Immunization History  Administered Date(s) Administered  . Hpv 10/18/2008, 03/07/2009  . Influenza Split 03/26/2016  . Influenza Whole 06/07/2009, 03/28/2010  . Influenza,inj,Quad PF,6+ Mos 05/30/2014, 06/28/2015, 06/22/2017, 03/16/2018  . Influenza-Unspecified 03/28/2013  . Td 03/07/2009    Qualifies for Shingles Vaccine?N/A   Screening Tests Health Maintenance  Topic Date Due  . TETANUS/TDAP  03/08/2019  . PAP SMEAR-Modifier  07/23/2019  . INFLUENZA VACCINE  Completed  . HIV Screening  Completed    Cancer Screenings: Lung: Low Dose CT Chest recommended if Age 74-80 years, 30 pack-year currently smoking OR have quit w/in 15years. Patient does not qualify. Breast:  Up to date on Mammogram? No   Up to date of Bone Density/Dexa? No Colorectal: Up to date   Additional Screenings:  Hepatitis C Screening: N/A      Plan:   Drink more water, exercise more, and start a plant based diet   I have personally reviewed and noted the following in the patient's chart:   . Medical and social history . Use of alcohol, tobacco or illicit drugs  . Current medications and supplements . Functional ability and status . Nutritional status . Physical activity . Advanced directives . List of other physicians . Hospitalizations, surgeries, and ER visits in previous 12 months . Vitals . Screenings to include cognitive, depression, and falls . Referrals and appointments  In addition, I have reviewed and discussed with patient certain preventive protocols, quality metrics, and best practice recommendations. A written personalized care plan for preventive services as well as  general preventive health recommendations were provided to patient.     Hayden Pedro, LPN  [id number]

## 2018-08-12 NOTE — Patient Instructions (Signed)
[place] , Thank you for taking time to come for your Medicare Wellness Visit. I appreciate your ongoing commitment to your health goals. Please review the following plan we discussed and let me know if I can assist you in the future.   Screening recommendations/referrals: Colonoscopy: up to date  Mammogram: N/A  Bone Density: N/A  Recommended yearly ophthalmology/optometry visit for glaucoma screening and checkup Recommended yearly dental visit for hygiene and checkup  Vaccinations: Influenza vaccine: up to date  Pneumococcal vaccine: N/A   Tdap vaccine: up to date  Shingles vaccine: N/A    Advanced directives: information provided   Conditions/risks identified: Chronic pain, thyroid disease, hypertension  Next appointment: Wellness visit in one year   Preventive Care 40-64 Years, Female Preventive care refers to lifestyle choices and visits with your health care provider that can promote health and wellness. What does preventive care include?  A yearly physical exam. This is also called an annual well check.  Dental exams once or twice a year.  Routine eye exams. Ask your health care provider how often you should have your eyes checked.  Personal lifestyle choices, including:  Daily care of your teeth and gums.  Regular physical activity.  Eating a healthy diet.  Avoiding tobacco and drug use.  Limiting alcohol use.  Practicing safe sex.  Taking low-dose aspirin daily starting at age 37.  Taking vitamin and mineral supplements as recommended by your health care provider. What happens during an annual well check? The services and screenings done by your health care provider during your annual well check will depend on your age, overall health, lifestyle risk factors, and family history of disease. Counseling  Your health care provider may ask you questions about your:  Alcohol use.  Tobacco use.  Drug use.  Emotional well-being.  Home and relationship  well-being.  Sexual activity.  Eating habits.  Work and work Statistician.  Method of birth control.  Menstrual cycle.  Pregnancy history. Screening  You may have the following tests or measurements:  Height, weight, and BMI.  Blood pressure.  Lipid and cholesterol levels. These may be checked every 5 years, or more frequently if you are over 95 years old.  Skin check.  Lung cancer screening. You may have this screening every year starting at age 37 if you have a 30-pack-year history of smoking and currently smoke or have quit within the past 15 years.  Fecal occult blood test (FOBT) of the stool. You may have this test every year starting at age 37.  Flexible sigmoidoscopy or colonoscopy. You may have a sigmoidoscopy every 5 years or a colonoscopy every 10 years starting at age 37.  Hepatitis C blood test.  Hepatitis B blood test.  Sexually transmitted disease (STD) testing.  Diabetes screening. This is done by checking your blood sugar (glucose) after you have not eaten for a while (fasting). You may have this done every 1-3 years.  Mammogram. This may be done every 1-2 years. Talk to your health care provider about when you should start having regular mammograms. This may depend on whether you have a family history of breast cancer.  BRCA-related cancer screening. This may be done if you have a family history of breast, ovarian, tubal, or peritoneal cancers.  Pelvic exam and Pap test. This may be done every 3 years starting at age 37. Starting at age 21, this may be done every 5 years if you have a Pap test in combination with an HPV test.  Bone density scan. This is done to screen for osteoporosis. You may have this scan if you are at high risk for osteoporosis. Discuss your test results, treatment options, and if necessary, the need for more tests with your health care provider. Vaccines  Your health care provider may recommend certain vaccines, such  as:  Influenza vaccine. This is recommended every year.  Tetanus, diphtheria, and acellular pertussis (Tdap, Td) vaccine. You may need a Td booster every 10 years.  Zoster vaccine. You may need this after age 37.  Pneumococcal 13-valent conjugate (PCV13) vaccine. You may need this if you have certain conditions and were not previously vaccinated.  Pneumococcal polysaccharide (PPSV23) vaccine. You may need one or two doses if you smoke cigarettes or if you have certain conditions. Talk to your health care provider about which screenings and vaccines you need and how often you need them. This information is not intended to replace advice given to you by your health care provider. Make sure you discuss any questions you have with your health care provider. Document Released: 08/10/2015 Document Revised: 04/02/2016 Document Reviewed: 05/15/2015 Elsevier Interactive Patient Education  2017 [employer]    Fall Prevention in the Home Falls can cause injuries. They can happen to people of all ages. There are many things you can do to make your home safe and to help prevent falls. What can I do on the outside of my home?  Regularly fix the edges of walkways and driveways and fix any cracks.  Remove anything that might make you trip as you walk through a door, such as a raised step or threshold.  Trim any bushes or trees on the path to your home.  Use bright outdoor lighting.  Clear any walking paths of anything that might make someone trip, such as rocks or tools.  Regularly check to see if handrails are loose or broken. Make sure that both sides of any steps have handrails.  Any raised decks and porches should have guardrails on the edges.  Have any leaves, snow, or ice cleared regularly.  Use sand or salt on walking paths during winter.  Clean up any spills in your garage right away. This includes oil or grease spills. What can I do in the bathroom?  Use night  lights.  Install grab bars by the toilet and in the tub and shower. Do not use towel bars as grab bars.  Use non-skid mats or decals in the tub or shower.  If you need to sit down in the shower, use a plastic, non-slip stool.  Keep the floor dry. Clean up any water that spills on the floor as soon as it happens.  Remove soap buildup in the tub or shower regularly.  Attach bath mats securely with double-sided non-slip rug tape.  Do not have throw rugs and other things on the floor that can make you trip. What can I do in the bedroom?  Use night lights.  Make sure that you have a light by your bed that is easy to reach.  Do not use any sheets or blankets that are too big for your bed. They should not hang down onto the floor.  Have a firm chair that has side arms. You can use this for support while you get dressed.  Do not have throw rugs and other things on the floor that can make you trip. What can I do in the kitchen?  Clean up any spills right away.  Avoid walking on   wet floors.  Keep items that you use a lot in easy-to-reach places.  If you need to reach something above you, use a strong step stool that has a grab bar.  Keep electrical cords out of the way.  Do not use floor polish or wax that makes floors slippery. If you must use wax, use non-skid floor wax.  Do not have throw rugs and other things on the floor that can make you trip. What can I do with my stairs?  Do not leave any items on the stairs.  Make sure that there are handrails on both sides of the stairs and use them. Fix handrails that are broken or loose. Make sure that handrails are as long as the stairways.  Check any carpeting to make sure that it is firmly attached to the stairs. Fix any carpet that is loose or worn.  Avoid having throw rugs at the top or bottom of the stairs. If you do have throw rugs, attach them to the floor with carpet tape.  Make sure that you have a light switch at the  top of the stairs and the bottom of the stairs. If you do not have them, ask someone to add them for you. What else can I do to help prevent falls?  Wear shoes that:  Do not have high heels.  Have rubber bottoms.  Are comfortable and fit you well.  Are closed at the toe. Do not wear sandals.  If you use a stepladder:  Make sure that it is fully opened. Do not climb a closed stepladder.  Make sure that both sides of the stepladder are locked into place.  Ask someone to hold it for you, if possible.  Clearly mark and make sure that you can see:  Any grab bars or handrails.  First and last steps.  Where the edge of each step is.  Use tools that help you move around (mobility aids) if they are needed. These include:  Canes.  Walkers.  Scooters.  Crutches.  Turn on the lights when you go into a dark area. Replace any light bulbs as soon as they burn out.  Set up your furniture so you have a clear path. Avoid moving your furniture around.  If any of your floors are uneven, fix them.  If there are any pets around you, be aware of where they are.  Review your medicines with your doctor. Some medicines can make you feel dizzy. This can increase your chance of falling. Ask your doctor what other things that you can do to help prevent falls. This information is not intended to replace advice given to you by your health care provider. Make sure you discuss any questions you have with your health care provider. Document Released: 05/10/2009 Document Revised: 12/20/2015 Document Reviewed: 08/18/2014 Elsevier Interactive Patient Education  2017 [employer] Inc.  

## 2018-08-17 ENCOUNTER — Ambulatory Visit (INDEPENDENT_AMBULATORY_CARE_PROVIDER_SITE_OTHER): Payer: Medicare Other | Admitting: Family Medicine

## 2018-08-17 ENCOUNTER — Encounter: Payer: Self-pay | Admitting: Family Medicine

## 2018-08-17 VITALS — BP 126/90 | HR 91 | Resp 12 | Ht 63.0 in | Wt 195.1 lb

## 2018-08-17 DIAGNOSIS — I10 Essential (primary) hypertension: Secondary | ICD-10-CM

## 2018-08-17 DIAGNOSIS — E890 Postprocedural hypothyroidism: Secondary | ICD-10-CM

## 2018-08-17 DIAGNOSIS — N63 Unspecified lump in unspecified breast: Secondary | ICD-10-CM | POA: Insufficient documentation

## 2018-08-17 DIAGNOSIS — F324 Major depressive disorder, single episode, in partial remission: Secondary | ICD-10-CM

## 2018-08-17 DIAGNOSIS — N644 Mastodynia: Secondary | ICD-10-CM

## 2018-08-17 DIAGNOSIS — E669 Obesity, unspecified: Secondary | ICD-10-CM | POA: Diagnosis not present

## 2018-08-17 MED ORDER — FLUOXETINE HCL 40 MG PO CAPS: 40.0000 mg | ORAL_CAPSULE | Freq: Every day | ORAL | 4 refills | Status: DC

## 2018-08-17 NOTE — Patient Instructions (Signed)
F/U as before, call if you need me sooner  Please schedule mammogram at checkout  Dose increase on fluoxetine  Will message you re dose inc for synthroid later today  It is important that you exercise regularly at least 30 minutes 5 times a week. If you develop chest pain, have severe difficulty breathing, or feel very tired, stop exercising immediately and seek medical attention  Thank you  for choosing [facility]. We consider it a privelige to serve you.  Delivering excellent health care in a caring and  compassionate way is our goal.  Partnering with you,  so that together we can achieve this goal is our strategy.

## 2018-08-18 ENCOUNTER — Other Ambulatory Visit (HOSPITAL_COMMUNITY): Payer: Self-pay | Admitting: Family Medicine

## 2018-08-18 DIAGNOSIS — R928 Other abnormal and inconclusive findings on diagnostic imaging of breast: Secondary | ICD-10-CM

## 2018-08-18 DIAGNOSIS — M545 Low back pain: Secondary | ICD-10-CM | POA: Diagnosis not present

## 2018-08-18 DIAGNOSIS — M130 Polyarthritis, unspecified: Secondary | ICD-10-CM | POA: Diagnosis not present

## 2018-08-18 DIAGNOSIS — M25569 Pain in unspecified knee: Secondary | ICD-10-CM | POA: Diagnosis not present

## 2018-08-18 DIAGNOSIS — M797 Fibromyalgia: Secondary | ICD-10-CM | POA: Diagnosis not present

## 2018-08-19 ENCOUNTER — Encounter: Payer: Self-pay | Admitting: Family Medicine

## 2018-09-06 NOTE — Progress Notes (Signed)
Martha Montgomery     MRN: [id number]      DOB: April 07, 1981   HPI [name] is here for follow up and re-evaluation of chronic medical conditions, medication management and review of any available recent lab and radiology data. Thyroid management is inadequate and she is to be treated by Endo Preventive health is updated, specifically  Cancer screening and Immunization.   Questions or concerns regarding consultations or procedures which the PT has had in the interim are  addressed. The PT denies any adverse reactions to current medications since the last visit.  Main concern is of bilateral breast lumps, there is no f/h of breast   C/o bilateral knee and back pain being uncontrolled she sees both pain management and Orthopedics  ROS Denies recent fever or chills. Denies sinus pressure, nasal congestion, ear pain or sore throat. Denies chest congestion, productive cough or wheezing. Denies chest pains, palpitations and leg swelling Denies abdominal pain, nausea, vomiting,diarrhea or constipation.   Denies dysuria, frequency, hesitancy or incontinence.  Denies headaches, seizures, numbness, or tingling. Denies depression, anxiety or insomnia. Denies skin break down or rash.   PE  BP 126/90   Pulse 91   Resp 12   Ht 5\' 3"  (1.6 m)   Wt 195 lb 1.3 oz (88.5 kg)   SpO2 100% Comment: room air  BMI 34.56 kg/m   Patient alert and oriented and in no cardiopulmonary distress.  HEENT: No facial asymmetry, EOMI,   oropharynx pink and moist.  Neck supple no JVD, no mass.  Chest: Clear to auscultation bilaterally. Breast: diffuse tenderness, no localized mass no nipple inversion, no supraclavicular or axillary nodes, no nipple discharge  CVS: S1, S2 no murmurs, no S3.Regular rate.  ABD: Soft non tender.   Ext: No edema  MS: Adequate ROM spine, shoulders, hips and knees.  Skin: Intact, no ulcerations or rash noted.  Psych: Good eye contact, normal affect. Memory intact mildly   anxious and  depressed appearing.  CNS: CN 2-12 intact, power,  normal throughout.no focal deficits noted.   Assessment & Plan  Breast pain C/o breast pain with masses. Clinical exam , no breast mass palpated. Refer for mammogram based on history  Multiple benign lumps of breast Clinical exam cystic breast, pt concerned re possibility of breast cancer, referred for imaging ADVISED TO STOP CAFFEINE , WEAR GOOD SUPPORTIVE BRAZIER AND USE IBUPOROEN OR TYLENOL FOR PAIN  Post-surgical hypothyroidism Markedly undercorrected despite multiple recent adjustments , gradually worsening, states last visit had to be rescheduled stressed the importance of Endo management  Obesity (BMI 30.0-34.9) Unchanged  Patient re-educated about  the importance of commitment to a  minimum of 150 minutes of exercise per week as able.  The importance of healthy food choices with portion control discussed, as well as eating regularly and within a 12 hour window most days. The need to choose "clean , green" food 50 to 75% of the time is discussed, as well as to make water the primary drink and set a goal of 64 ounces water daily.  Encouraged to start a food diary,  and to consider  joining a support group. Sample diet sheets offered. Goals set by the patient for the next several months.   Weight /BMI 08/17/2018 08/12/2018 06/10/2018  WEIGHT 195 lb 1.3 oz 194 lb 198 lb 0.6 oz  HEIGHT 5\' 3"  5\' 3"  5\' 3"   BMI 34.56 kg/m2 34.37 kg/m2 35.08 kg/m2      Depression, major, single episode,  in partial remission (HCC) Increase fluoxetine dose and re eval  HTN (hypertension), benign Uncontrolled, no med change will re eval next visit , work on lifestyle change DASH diet and commitment to daily physical activity for a minimum of 30 minutes discussed and encouraged, as a part of hypertension management. The importance of attaining a healthy weight is also discussed.  BP/Weight 08/17/2018 08/12/2018 06/10/2018 06/08/2018  05/19/2018 05/10/2018 8/72/1587  Systolic BP 276 184 859 276 394 320 037  Diastolic BP 90 88 84 87 87 82 84  Wt. (Lbs) 195.08 194 198.04 - 197.2 190 198.04  BMI 34.56 34.37 35.08 - 34.93 33.66 35.08

## 2018-09-07 ENCOUNTER — Ambulatory Visit (HOSPITAL_COMMUNITY)
Admission: RE | Admit: 2018-09-07 | Discharge: 2018-09-07 | Disposition: A | Discharge status: Home / Self Care | Payer: Medicare Other | Source: Ambulatory Visit | Attending: Family Medicine | Admitting: Family Medicine

## 2018-09-07 DIAGNOSIS — R928 Other abnormal and inconclusive findings on diagnostic imaging of breast: Secondary | ICD-10-CM | POA: Diagnosis not present

## 2018-09-07 DIAGNOSIS — R922 Inconclusive mammogram: Secondary | ICD-10-CM | POA: Diagnosis not present

## 2018-09-07 DIAGNOSIS — N63 Unspecified lump in unspecified breast: Secondary | ICD-10-CM | POA: Insufficient documentation

## 2018-09-07 DIAGNOSIS — N6489 Other specified disorders of breast: Secondary | ICD-10-CM | POA: Diagnosis not present

## 2018-09-07 DIAGNOSIS — N644 Mastodynia: Secondary | ICD-10-CM | POA: Insufficient documentation

## 2018-09-10 ENCOUNTER — Encounter: Payer: Self-pay | Admitting: Family Medicine

## 2018-09-10 NOTE — Assessment & Plan Note (Signed)
C/o breast pain with masses. Clinical exam , no breast mass palpated. Refer for mammogram based on history

## 2018-09-10 NOTE — Assessment & Plan Note (Signed)
Markedly undercorrected despite multiple recent adjustments , gradually worsening, states last visit had to be rescheduled stressed the importance of Endo management

## 2018-09-10 NOTE — Assessment & Plan Note (Signed)
Increase fluoxetine dose and re eval

## 2018-09-10 NOTE — Assessment & Plan Note (Signed)
Unchanged  Patient re-educated about  the importance of commitment to a  minimum of 150 minutes of exercise per week as able.  The importance of healthy food choices with portion control discussed, as well as eating regularly and within a 12 hour window most days. The need to choose "clean , green" food 50 to 75% of the time is discussed, as well as to make water the primary drink and set a goal of 64 ounces water daily.  Encouraged to start a food diary,  and to consider  joining a support group. Sample diet sheets offered. Goals set by the patient for the next several months.   Weight /BMI 08/17/2018 08/12/2018 06/10/2018  WEIGHT 195 lb 1.3 oz 194 lb 198 lb 0.6 oz  HEIGHT 5\' 3"  5\' 3"  5\' 3"   BMI 34.56 kg/m2 34.37 kg/m2 35.08 kg/m2

## 2018-09-10 NOTE — Assessment & Plan Note (Signed)
Clinical exam cystic breast, pt concerned re possibility of breast cancer, referred for imaging ADVISED TO STOP CAFFEINE , WEAR GOOD SUPPORTIVE BRAZIER AND USE IBUPOROEN OR TYLENOL FOR PAIN

## 2018-09-10 NOTE — Assessment & Plan Note (Signed)
Uncontrolled, no med change will re eval next visit , work on lifestyle change DASH diet and commitment to daily physical activity for a minimum of 30 minutes discussed and encouraged, as a part of hypertension management. The importance of attaining a healthy weight is also discussed.  BP/Weight 08/17/2018 08/12/2018 06/10/2018 06/08/2018 05/19/2018 05/10/2018 11/29/1362  Systolic BP 383 779 396 886 484 720 721  Diastolic BP 90 88 84 87 87 82 84  Wt. (Lbs) 195.08 194 198.04 - 197.2 190 198.04  BMI 34.56 34.37 35.08 - 34.93 33.66 35.08

## 2018-09-14 ENCOUNTER — Encounter: Payer: Self-pay | Admitting: "Endocrinology

## 2018-09-14 ENCOUNTER — Ambulatory Visit (INDEPENDENT_AMBULATORY_CARE_PROVIDER_SITE_OTHER): Payer: Medicare Other | Admitting: "Endocrinology

## 2018-09-14 VITALS — BP 120/85 | HR 68 | Ht 63.0 in | Wt 193.0 lb

## 2018-09-14 DIAGNOSIS — E890 Postprocedural hypothyroidism: Secondary | ICD-10-CM

## 2018-09-14 NOTE — Progress Notes (Signed)
Endocrinology Consult Note                                            09/14/2018, 4:51 PM   Subjective:    Patient ID: Martha Montgomery, female    DOB: 10-13-1980, PCP Fayrene Helper, MD   Past Medical History:  Diagnosis Date  . Anxiety   . Anxiety disorder   . Arthritis    "knees, joints, hands, toes" (09/28/2013)  . Depression   . Fibromyalgia   . Gastritis nov. 2011   EGD by Dr. Oneida Alar, negative h pylori  . GERD (gastroesophageal reflux disease)   . Gout   . Hypertension x 6 years   . Hypothyroidism   . Migraines    "15/month average" (09/28/2013)  . Spinal stenosis   . Thyroid goiter    Past Surgical History:  Procedure Laterality Date  . BIOPSY  10/20/2017   Procedure: BIOPSY;  Surgeon: Danie Binder, MD;  Location: [facility];  Service: Endoscopy;;  gastric duodenal esophagus  . CESAREAN SECTION  2008  . CHONDROPLASTY  04/02/2012   Procedure: CHONDROPLASTY;  Surgeon: Carole Civil, MD;  Location: [facility];  Service: Orthopedics;  Laterality: Left;  of left patella  . COLONOSCOPY  May 2012   Dr. Oneida Alar: normal colon, small internal hemorrhoids  . COLONOSCOPY WITH PROPOFOL N/A 10/22/2017   Procedure: COLONOSCOPY WITH PROPOFOL;  Surgeon: Danie Binder, MD;  Location: [facility];  Service: Endoscopy;  Laterality: N/A;  7:30am  . COLONOSCOPY WITH PROPOFOL N/A 10/20/2017   Procedure: COLONOSCOPY WITH PROPOFOL;  Surgeon: Danie Binder, MD;  Location: [facility];  Service: Endoscopy;  Laterality: N/A;  11:00am  . ESOPHAGOGASTRODUODENOSCOPY (EGD) WITH PROPOFOL N/A 10/20/2017   Procedure: ESOPHAGOGASTRODUODENOSCOPY (EGD) WITH PROPOFOL;  Surgeon: Danie Binder, MD;  Location: [facility];  Service: Endoscopy;  Laterality: N/A;  . INCISION AND DRAINAGE ABSCESS  1997; 2005   "under right buttocks; under left arm"  . SAVORY DILATION N/A 10/20/2017   Procedure: SAVORY DILATION;  Surgeon: Danie Binder, MD;  Location: [facility];  Service: Endoscopy;   Laterality: N/A;  . THYROIDECTOMY N/A 09/28/2013   Procedure: TOTAL THYROIDECTOMY;  Surgeon: Ascencion Dike, MD;  Location: [facility];  Service: ENT;  Laterality: N/A;  . TOTAL THYROIDECTOMY  09/27/2013  . UPPER GI ENDOSCOPY  2011   EGD: gastritis, no h.pylori   Social History   Socioeconomic History  . Marital status: Legally Separated    Spouse name: Not on file  . Number of children: 1  . Years of education: 67  . Highest education level: 12th grade  Occupational History  . Occupation: Hospital doctor: UNEMPLOYED  Social Needs  . Financial resource strain: Somewhat hard  . Food insecurity:    Worry: Sometimes true    Inability: Sometimes true  . Transportation needs:    Medical: No    Non-medical: No  Tobacco Use  . Smoking status: Former Smoker    Packs/day: 1.00    Years: 5.00    Pack years: 5.00    Types: Cigarettes    Last attempt to quit: 09/11/2010    Years since quitting: 8.0  . Smokeless tobacco: Never Used  . Tobacco comment: QUIT SMOKING X 3 YEARS AGO  Substance and Sexual Activity  . Alcohol use: No  . Drug use: No  . Sexual activity: Not Currently    Partners: Male    Birth control/protection: Other-see comments, Injection    Comment: Depo  Lifestyle  . Physical activity:    Days per week: 2 days    Minutes per session: 20 min  . Stress: Only a little  Relationships  . Social connections:    Talks on phone: More than three times a week    Gets together: More than three times a week    Attends religious service: 1 to 4 times per year    Active member of club or organization: Yes    Attends meetings of clubs or organizations: 1 to 4 times per year    Relationship status: Married  Other Topics Concern  . Not on file  Social History Narrative   Pt is currently on medicaid. From [place] and came to [place] in mid 2010   Is separated from Husband, lives with Mother right now    Outpatient Encounter Medications as of  09/14/2018  Medication Sig  . hydrOXYzine (ATARAX/VISTARIL) 50 MG tablet Take 50 mg by mouth 2 (two) times daily.  . traMADol (ULTRAM) 50 MG tablet Take by mouth every 12 (twelve) hours as needed.  Marland Kitchen amLODipine (NORVASC) 2.5 MG tablet Take 1 tablet (2.5 mg total) by mouth daily.  . diphenhydrAMINE (BENADRYL) 25 MG tablet Take 25 mg by mouth daily.  . fluconazole (DIFLUCAN) 150 MG tablet One tablet once daily as needed , for vaginal itch associated with prolonged antibiotic use  . FLUoxetine (PROZAC) 40 MG capsule Take 1 capsule (40 mg total) by mouth daily.  . fluticasone (FLONASE) 50 MCG/ACT nasal spray Place 1 spray into both nostrils daily as needed for allergies or rhinitis.  . hydrochlorothiazide (MICROZIDE) 12.5 MG capsule Take 1 capsule (12.5 mg total) by mouth daily.  . hydrocortisone (ANUSOL-HC) 2.5 % rectal cream Place 1 application rectally 2 (two) times daily. for up to 10 days at a time.  Marland Kitchen levothyroxine (SYNTHROID, LEVOTHROID) 200 MCG tablet TAKE 1 TABLET BY MOUTH EVERY DAY BEFORE BREAKFAST  . lubiprostone (AMITIZA) 24 MCG capsule Take 1 capsule (24 mcg total) by mouth 2 (two) times daily with a meal.  . medroxyPROGESTERone (DEPO-PROVERA) 150 MG/ML injection Inject 150 mg into the muscle every 3 (three) months.  . metoprolol tartrate (LOPRESSOR) 50 MG tablet Take 1 tablet (50 mg total) by mouth 2 (two) times daily.  . pantoprazole (PROTONIX) 40 MG tablet Take 1 tablet (40 mg total) by mouth daily. Take 30 minutes before breakfast (Patient taking differently: Take 40 mg by mouth daily as needed (pain). Take 30 minutes before breakfast)  . potassium chloride (K-DUR) 10 MEQ tablet Take 1 tablet (10 mEq total) by mouth daily.  . pregabalin (LYRICA) 100 MG capsule Take 100 mg by mouth 3 (three) times daily.   No facility-administered encounter medications on file as of 09/14/2018.    ALLERGIES: Allergies  Allergen Reactions  . Codeine Itching  . Ibuprofen Other (See Comments)     Stomach pain  . Soybean-Containing Drug Products Other (See Comments)    Tongue swells a little and feels "rug burned".    VACCINATION STATUS: Immunization History  Administered Date(s) Administered  . Hpv 10/18/2008, 03/07/2009  . Influenza Split 03/26/2016  . Influenza Whole 06/07/2009, 03/28/2010  . Influenza,inj,Quad PF,6+ Mos 05/30/2014, 06/28/2015, 06/22/2017, 03/16/2018  . Influenza-Unspecified 03/28/2013  . Td 03/07/2009    HPI  Martha Montgomery is 38 y.o. female who presents today with a medical history as above. she is being seen in consultation for postsurgical hypothyroidism requested by Fayrene Helper, MD.  -Her history starts in 2015 when she underwent total thyroidectomy for multinodular goiter revealing benign findings. -She was treated with various forms of thyroid hormone at various doses over the years, currently on levothyroxine 200 mcg p.o. every morning.  She reports compliance to this medication taking it every morning on empty stomach half an hour before other medications or food. -She reportedly had difficulty regulating her thyroid function tests, most recent labs show TSH of 15+.  No corresponding free T4 nor free T3 was measured.  -She complains of progressive weight gain, fluctuating energy level, anxiety, and sleep disturbance.  Review of Systems  Constitutional: + Progressive weight gain, + fatigue, no subjective hyperthermia, no subjective hypothermia Eyes: no blurry vision, no xerophthalmia ENT: no sore throat, no nodules palpated in throat, no dysphagia/odynophagia, no hoarseness Cardiovascular: no Chest Pain, no Shortness of Breath, no palpitations, no leg swelling Respiratory: no cough, no shortness of breath Gastrointestinal: no Nausea/Vomiting/Diarhhea Musculoskeletal: no muscle/joint aches Skin: no rashes Neurological: no tremors, no numbness, no tingling, no dizziness Psychiatric: no depression, no anxiety  Objective:    BP 120/85    Pulse 68   Ht 5\' 3"  (1.6 m)   Wt 193 lb (87.5 kg)   BMI 34.19 kg/m   Wt Readings from Last 3 Encounters:  09/14/18 193 lb (87.5 kg)  08/17/18 195 lb 1.3 oz (88.5 kg)  08/12/18 194 lb (88 kg)    Physical Exam  Constitutional:  + Obese for height, not in acute distress, normal state of mind Eyes: PERRLA, EOMI, no exophthalmos ENT: moist mucous membranes, + post thyroidectomy scar on anterior lower neck,  no gross cervical lymphadenopathy Cardiovascular: normal precordial activity, Regular Rate and Rhythm, no Murmur/Rubs/Gallops Respiratory:  adequate breathing efforts, no gross chest deformity, Clear to auscultation bilaterally Gastrointestinal: abdomen soft, Non -tender, No distension, Bowel Sounds present, no gross organomegaly Musculoskeletal: no gross deformities, strength intact in all four extremities Skin: moist, warm, no rashes Neurological: + tremor with outstretched hands, Deep tendon reflexes normal in bilateral lower extremities.  CMP ( most recent) CMP     Component Value Date/Time   NA 141 08/03/2018 1506   K 4.1 08/03/2018 1506   CL 105 08/03/2018 1506   CO2 27 08/03/2018 1506   GLUCOSE 83 08/03/2018 1506   BUN 5 (L) 08/03/2018 1506   CREATININE 0.81 08/03/2018 1506   CALCIUM 9.2 08/03/2018 1506   PROT 7.3 08/03/2018 1506   ALBUMIN 4.7 08/07/2016 1624   AST 15 08/03/2018 1506   ALT 11 08/03/2018 1506   ALKPHOS 51 08/07/2016 1624   BILITOT 0.4 08/03/2018 1506   GFRNONAA 93 08/03/2018 1506   GFRAA 108 08/03/2018 1506     Diabetic Labs (most recent): Lab Results  Component Value Date   HGBA1C 5.6 08/03/2018   HGBA1C 5.7 (H) 11/18/2017     Lipid Panel ( most recent) Lipid Panel     Component Value Date/Time   CHOL 240 (H) 08/03/2018 1506   TRIG 109 08/03/2018 1506   HDL 51 08/03/2018 1506   CHOLHDL 4.7 08/03/2018 1506   VLDL 16 08/07/2016 1624   LDLCALC 166 (H) 08/03/2018 1506      Lab Results  Component Value Date   TSH 15.64 (H) 08/03/2018    TSH 8.28 (H) 04/13/2018   TSH 0.31 (L)  11/18/2017   TSH 9.02 (H) 06/10/2017   TSH 4.24 08/07/2016   TSH 0.24 (L) 03/06/2016   TSH 36.77 (H) 12/19/2015   TSH 40.815 (H) 06/29/2015   TSH 0.015 (L) 07/25/2014   TSH 10.016 (H) 05/30/2014     Assessment & Plan:   1. Post-surgical hypothyroidism - Martha Montgomery  is being seen at a kind request of Fayrene Helper, MD. - I have reviewed her available thyroid records and clinically evaluated the patient. - Based on these reviews, she has had total thyroidectomy in 2015 for goiter which revealed benign findings.  Repeat thyroid/neck ultrasound on April 20, 2018 was consistent with surgically absent thyroid.  -Review of her thyroid function tests involves only TSH since 2015 showing markedly fluctuating TSH ranging from 0.015- 40.815.   -Patient insists on compliance to her levothyroxine.    -I approached her for repeat full profile thyroid function tests today and return in 3 days to review her results.   -Based on her current body weight approximately 150 mcg of levothyroxine should be more than enough to replace her surgically absent thyroid.  All attempts will be to avoid overtreatment as well as under treatment. -She may benefit from brand name Synthroid over levothyroxine if she continues to have above target TSH.    Till her next visit, she will be continued on her current dose of levothyroxine 200 mcg p.o. nightly.   - We discussed about the correct intake of her thyroid hormone, on empty stomach at fasting, with water, separated by at least 30 minutes from breakfast and other medications,  and separated by more than 4 hours from calcium, iron, multivitamins, acid reflux medications (PPIs). -Patient is made aware of the fact that thyroid hormone replacement is needed for life, dose to be adjusted by periodic monitoring of thyroid function tests.  -Her most recent thyroid/neck ultrasound from September 2019 was consistent with  surgically absent thyroid, no need for repeat imaging. -Her lab work will involve TPO and thyroglobulin antibodies, if indicative of positive antithyroid antibodies, she will be considered for ophthalmology referral given her complaint of teary eyes on and off.  Regarding her weight concern: - Patient admits there is a room for improvement in her diet and drink choices. -  Suggestion is made for her to avoid simple carbohydrates  from her diet including Cakes, Sweet Desserts / Pastries, Ice Cream, Soda (diet and regular), Sweet Tea, Candies, Chips, Cookies, Store Bought Juices, Alcohol in Excess of  1-2 drinks a day, Artificial Sweeteners, and "Sugar-free" Products. This will help patient to have stable blood glucose profile and potentially avoid unintended weight gain.  - I did not initiate any new prescriptions today. - I advised her  to maintain close follow up with Fayrene Helper, MD for primary care needs.   - Time spent with the patient: 35 minutes, of which >50% was spent in obtaining information about her symptoms, reviewing her previous labs/studies,  evaluations, and treatments, counseling her about her postsurgical hypothyroidism, and developing a plan to confirm the diagnosis and long term treatment based on the latest standards of care/guidelines.    Martha Montgomery participated in the discussions, expressed understanding, and voiced agreement with the above plans.  All questions were answered to her satisfaction. she is encouraged to contact clinic should she have any questions or concerns prior to her return visit.  Follow up plan: Return in about 3 days (around 09/17/2018) for Labs Today- Non-Fasting Ok.   Heriberto Antigua  Dorris Fetch, MD [facility] [address] [place], [place] [address] Phone: [phone]  Fax: [phone]     09/14/2018, 4:51 PM  This note was partially dictated with voice recognition software. Similar sounding  words can be transcribed inadequately or may not  be corrected upon review.

## 2018-09-15 DIAGNOSIS — M130 Polyarthritis, unspecified: Secondary | ICD-10-CM | POA: Diagnosis not present

## 2018-09-15 DIAGNOSIS — E890 Postprocedural hypothyroidism: Secondary | ICD-10-CM | POA: Diagnosis not present

## 2018-09-15 DIAGNOSIS — M25569 Pain in unspecified knee: Secondary | ICD-10-CM | POA: Diagnosis not present

## 2018-09-15 DIAGNOSIS — M797 Fibromyalgia: Secondary | ICD-10-CM | POA: Diagnosis not present

## 2018-09-15 DIAGNOSIS — Z79891 Long term (current) use of opiate analgesic: Secondary | ICD-10-CM | POA: Diagnosis not present

## 2018-09-16 LAB — T4, FREE: Free T4: 1.3 ng/dL (ref 0.8–1.8)

## 2018-09-16 LAB — TSH: TSH: 3.8 mIU/L

## 2018-09-16 LAB — T3, FREE: T3, Free: 3 pg/mL (ref 2.3–4.2)

## 2018-09-16 LAB — THYROID PEROXIDASE ANTIBODY: Thyroperoxidase Ab SerPl-aCnc: <1 IU/mL (ref <9)

## 2018-09-16 LAB — THYROGLOBULIN ANTIBODY: Thyroglobulin Ab: <1 IU/mL (ref <=1)

## 2018-09-17 ENCOUNTER — Ambulatory Visit: Payer: Medicare Other | Admitting: "Endocrinology

## 2018-10-05 ENCOUNTER — Ambulatory Visit (INDEPENDENT_AMBULATORY_CARE_PROVIDER_SITE_OTHER): Payer: Medicare Other | Admitting: "Endocrinology

## 2018-10-05 ENCOUNTER — Encounter: Payer: Self-pay | Admitting: "Endocrinology

## 2018-10-05 VITALS — BP 138/89 | HR 76 | Ht 63.0 in | Wt 195.0 lb

## 2018-10-05 DIAGNOSIS — E890 Postprocedural hypothyroidism: Secondary | ICD-10-CM | POA: Diagnosis not present

## 2018-10-05 MED ORDER — LEVOTHYROXINE SODIUM 200 MCG PO TABS: ORAL_TABLET | ORAL | 2 refills | Status: DC

## 2018-10-05 NOTE — Progress Notes (Signed)
Endocrinology follow-up  Note                                            10/05/2018, 4:30 PM   Subjective:    Patient ID: Martha Montgomery, female    DOB: 05-Jul-1981, PCP Fayrene Helper, MD   Past Medical History:  Diagnosis Date  . Anxiety   . Anxiety disorder   . Arthritis    "knees, joints, hands, toes" (09/28/2013)  . Depression   . Fibromyalgia   . Gastritis nov. 2011   EGD by Dr. Oneida Alar, negative h pylori  . GERD (gastroesophageal reflux disease)   . Gout   . Hypertension x 6 years   . Hypothyroidism   . Migraines    "15/month average" (09/28/2013)  . Spinal stenosis   . Thyroid goiter    Past Surgical History:  Procedure Laterality Date  . BIOPSY  10/20/2017   Procedure: BIOPSY;  Surgeon: Danie Binder, MD;  [facility];  Service: Endoscopy;;  gastric duodenal esophagus  . CESAREAN SECTION  2008  . CHONDROPLASTY  04/02/2012   Procedure: CHONDROPLASTY;  Surgeon: Carole Civil, MD;  Location: [facility];  Service: Orthopedics;  Laterality: Left;  of left patella  . COLONOSCOPY  May 2012   Dr. Oneida Alar: normal colon, small internal hemorrhoids  . COLONOSCOPY WITH PROPOFOL N/A 10/22/2017   Procedure: COLONOSCOPY WITH PROPOFOL;  Surgeon: Danie Binder, MD;  [facility];  Service: Endoscopy;  Laterality: N/A;  7:30am  . COLONOSCOPY WITH PROPOFOL N/A 10/20/2017   Procedure: COLONOSCOPY WITH PROPOFOL;  Surgeon: Danie Binder, MD;  [facility];  Service: Endoscopy;  Laterality: N/A;  11:00am  . ESOPHAGOGASTRODUODENOSCOPY (EGD) WITH PROPOFOL N/A 10/20/2017   Procedure: ESOPHAGOGASTRODUODENOSCOPY (EGD) WITH PROPOFOL;  Surgeon: Danie Binder, MD;  [facility];  Service: Endoscopy;  Laterality: N/A;  . INCISION AND DRAINAGE ABSCESS  1997; 2005   "under right buttocks; under left arm"  . SAVORY DILATION N/A 10/20/2017   Procedure: SAVORY DILATION;  Surgeon: Danie Binder, MD;  [facility]: [facility];  Service:  Endoscopy;  Laterality: N/A;  . THYROIDECTOMY N/A 09/28/2013   Procedure: TOTAL THYROIDECTOMY;  Surgeon: Ascencion Dike, MD;  Location: [facility];  Service: ENT;  Laterality: N/A;  . TOTAL THYROIDECTOMY  09/27/2013  . UPPER GI ENDOSCOPY  2011   EGD: gastritis, no h.pylori   Social History   Socioeconomic History  . Marital status: Legally Separated    Spouse name: Not on file  . Number of children: 1  . Years of education: 65  . Highest education level: 12th grade  Occupational History  . Occupation: Hospital doctor: UNEMPLOYED  Social Needs  . Financial resource strain: Somewhat hard  . Food insecurity:    Worry: Sometimes true    Inability: Sometimes true  . Transportation needs:    Medical: No    Non-medical: No  Tobacco Use  . Smoking status: Former Smoker    Packs/day: 1.00    Years: 5.00    Pack years: 5.00    Types: Cigarettes    Last attempt to quit: 09/11/2010    Years since quitting: 8.0  . Smokeless tobacco: Never Used  . Tobacco comment: QUIT SMOKING X 3 YEARS AGO  Substance and Sexual Activity  . Alcohol use: No  . Drug use: No  . Sexual activity: Not Currently    Partners: Male    Birth control/protection: Other-see comments, Injection    Comment: Depo  Lifestyle  . Physical activity:    Days per week: 2 days    Minutes per session: 20 min  . Stress: Only a little  Relationships  . Social connections:    Talks on phone: More than three times a week    Gets together: More than three times a week    Attends religious service: 1 to 4 times per year    Active member of club or organization: Yes    Attends meetings of clubs or organizations: 1 to 4 times per year    Relationship status: Married  Other Topics Concern  . Not on file  Social History Narrative   Pt is currently on medicaid. From [place] and came to [place] in mid 2010   Is separated from Husband, lives with Mother right now    Outpatient Encounter  Medications as of 10/05/2018  Medication Sig  . amLODipine (NORVASC) 2.5 MG tablet Take 1 tablet (2.5 mg total) by mouth daily.  . fluconazole (DIFLUCAN) 150 MG tablet One tablet once daily as needed , for vaginal itch associated with prolonged antibiotic use  . FLUoxetine (PROZAC) 40 MG capsule Take 1 capsule (40 mg total) by mouth daily.  . fluticasone (FLONASE) 50 MCG/ACT nasal spray Place 1 spray into both nostrils daily as needed for allergies or rhinitis.  . hydrochlorothiazide (MICROZIDE) 12.5 MG capsule Take 1 capsule (12.5 mg total) by mouth daily.  . hydrocortisone (ANUSOL-HC) 2.5 % rectal cream Place 1 application rectally 2 (two) times daily. for up to 10 days at a time.  . hydrOXYzine (ATARAX/VISTARIL) 50 MG tablet Take 50 mg by mouth 2 (two) times daily.  Marland Kitchen levothyroxine (SYNTHROID, LEVOTHROID) 200 MCG tablet TAKE 1 TABLET BY MOUTH EVERY DAY BEFORE BREAKFAST  . lubiprostone (AMITIZA) 24 MCG capsule Take 1 capsule (24 mcg total) by mouth 2 (two) times daily with a meal.  . medroxyPROGESTERone (DEPO-PROVERA) 150 MG/ML injection Inject 150 mg into the muscle every 3 (three) months.  . metoprolol tartrate (LOPRESSOR) 50 MG tablet Take 1 tablet (50 mg total) by mouth 2 (two) times daily.  . pantoprazole (PROTONIX) 40 MG tablet Take 1 tablet (40 mg total) by mouth daily. Take 30 minutes before breakfast (Patient taking differently: Take 40 mg by mouth daily as needed (pain). Take 30 minutes before breakfast)  . potassium chloride (K-DUR) 10 MEQ tablet Take 1 tablet (10 mEq total) by mouth daily.  . pregabalin (LYRICA) 100 MG capsule Take 100 mg by mouth 3 (three) times daily.  . traMADol (ULTRAM) 50 MG tablet Take by mouth every 12 (twelve) hours as needed.  . [DISCONTINUED] diphenhydrAMINE (BENADRYL) 25 MG tablet Take 25 mg by mouth daily.  . [DISCONTINUED] levothyroxine (SYNTHROID, LEVOTHROID) 200 MCG tablet TAKE 1 TABLET BY MOUTH EVERY DAY BEFORE BREAKFAST   No facility-administered  encounter medications on file as of 10/05/2018.    ALLERGIES: Allergies  Allergen Reactions  . Codeine Itching  . Ibuprofen Other (See Comments)    Stomach pain  . Soybean-Containing Drug Products Other (See Comments)    Tongue swells a little and feels "rug burned".    VACCINATION STATUS: Immunization History  Administered Date(s) Administered  . Hpv 10/18/2008, 03/07/2009  . Influenza Split 03/26/2016  . Influenza Whole 06/07/2009, 03/28/2010  .  Influenza,inj,Quad PF,6+ Mos 05/30/2014, 06/28/2015, 06/22/2017, 03/16/2018  . Influenza-Unspecified 03/28/2013  . Td 03/07/2009    HPI MOLLI GETHERS is 38 y.o. female who presents today with a medical history as above. she is returning with repeat thyroid function tests after she was seen in consultation for longstanding history of postsurgical hypothyroidism requested by Fayrene Helper, MD.  -Her history starts in 2015 when she underwent total thyroidectomy for multinodular goiter revealing benign findings. -She was treated with various forms of thyroid hormone at various doses over the years, currently on levothyroxine 200 mcg p.o. every morning.   -Her previsit thyroid function tests are consistent with appropriate replacement.  She reports compliance to this medication.  - She complains of progressive weight gain, fluctuating energy level, anxiety, and sleep disturbance.  Review of Systems  Constitutional: + Progressive weight gain, + fatigue, no subjective hyperthermia, no subjective hypothermia Eyes: no blurry vision, no xerophthalmia ENT: no sore throat, no nodules palpated in throat, no dysphagia/odynophagia, no hoarseness Cardiovascular: no Chest Pain, no Shortness of Breath, no palpitations, no leg swelling Respiratory: no cough, no shortness of breath Gastrointestinal: no Nausea/Vomiting/Diarhhea Musculoskeletal: no muscle/joint aches Skin: no rashes Neurological: no tremors, no numbness, no tingling, no  dizziness Psychiatric: no depression, no anxiety  Objective:    BP 138/89   Pulse 76   Ht 5\' 3"  (1.6 m)   Wt 195 lb (88.5 kg)   BMI 34.54 kg/m   Wt Readings from Last 3 Encounters:  10/05/18 195 lb (88.5 kg)  09/14/18 193 lb (87.5 kg)  08/17/18 195 lb 1.3 oz (88.5 kg)    Physical Exam  Constitutional:  + Obese for height, not in acute distress, normal state of mind Eyes: PERRLA, EOMI, no exophthalmos ENT: moist mucous membranes, + post thyroidectomy scar on anterior lower neck,  no gross cervical lymphadenopathy Musculoskeletal: no gross deformities, strength intact in all four extremities Skin: moist, warm, no rashes Neurological: + tremor with outstretched hands, Deep tendon reflexes normal in bilateral lower extremities.  CMP ( most recent) CMP     Component Value Date/Time   NA 141 08/03/2018 1506   K 4.1 08/03/2018 1506   CL 105 08/03/2018 1506   CO2 27 08/03/2018 1506   GLUCOSE 83 08/03/2018 1506   BUN 5 (L) 08/03/2018 1506   CREATININE 0.81 08/03/2018 1506   CALCIUM 9.2 08/03/2018 1506   PROT 7.3 08/03/2018 1506   ALBUMIN 4.7 08/07/2016 1624   AST 15 08/03/2018 1506   ALT 11 08/03/2018 1506   ALKPHOS 51 08/07/2016 1624   BILITOT 0.4 08/03/2018 1506   GFRNONAA 93 08/03/2018 1506   GFRAA 108 08/03/2018 1506     Diabetic Labs (most recent): Lab Results  Component Value Date   HGBA1C 5.6 08/03/2018   HGBA1C 5.7 (H) 11/18/2017     Lipid Panel ( most recent) Lipid Panel     Component Value Date/Time   CHOL 240 (H) 08/03/2018 1506   TRIG 109 08/03/2018 1506   HDL 51 08/03/2018 1506   CHOLHDL 4.7 08/03/2018 1506   VLDL 16 08/07/2016 1624   LDLCALC 166 (H) 08/03/2018 1506     Results for MANE, CONSOLO "MARKASIA CARROL" (MRN [id number]) as of 10/05/2018 16:32  Ref. Range 09/15/2018 15:54  TSH Latest Units: mIU/L 3.80  Triiodothyronine,Free,Serum Latest Ref Range: 2.3 - 4.2 pg/mL 3.0  T4,Free(Direct) Latest Ref Range: 0.8 - 1.8 ng/dL 1.3  Thyroglobulin  Ab Latest Ref Range: < or = 1 IU/mL <1  Thyroperoxidase  Ab SerPl-aCnc Latest Ref Range: <9 IU/mL <1    Assessment & Plan:   1. Post-surgical hypothyroidism -Her previsit thyroid function tests are consistent with appropriate replacement.  She is advised to continue levothyroxine 200 mcg p.o. every morning.  - We discussed about the correct intake of her thyroid hormone, on empty stomach at fasting, with water, separated by at least 30 minutes from breakfast and other medications,  and separated by more than 4 hours from calcium, iron, multivitamins, acid reflux medications (PPIs). -Patient is made aware of the fact that thyroid hormone replacement is needed for life, dose to be adjusted by periodic monitoring of thyroid function tests.  -Her most recent thyroid/neck ultrasound from September 2019 was consistent with surgically absent thyroid, no need for repeat imaging.   Regarding her weight concern: - Patient admits there is a room for improvement in her diet and drink choices. -  Suggestion is made for her to avoid simple carbohydrates  from her diet including Cakes, Sweet Desserts / Pastries, Ice Cream, Soda (diet and regular), Sweet Tea, Candies, Chips, Cookies, Store Bought Juices, Alcohol in Excess of  1-2 drinks a day, Artificial Sweeteners, and "Sugar-free" Products.   - I advised her  to maintain close follow up with Fayrene Helper, MD for primary care needs.   Follow up plan: Return in about 3 months (around 01/05/2019) for Follow up with Pre-visit Labs.   Glade Lloyd, MD [facility] [address] [place], [place] [address] Phone: [phone]  Fax: [phone]     10/05/2018, 4:30 PM  This note was partially dictated with voice recognition software. Similar sounding words can be transcribed inadequately or may not  be corrected upon review.

## 2018-10-13 DIAGNOSIS — M545 Low back pain: Secondary | ICD-10-CM | POA: Diagnosis not present

## 2018-10-13 DIAGNOSIS — M25569 Pain in unspecified knee: Secondary | ICD-10-CM | POA: Diagnosis not present

## 2018-10-13 DIAGNOSIS — M25572 Pain in left ankle and joints of left foot: Secondary | ICD-10-CM | POA: Diagnosis not present

## 2018-10-14 ENCOUNTER — Encounter: Payer: Self-pay | Admitting: Family Medicine

## 2018-10-14 ENCOUNTER — Ambulatory Visit (INDEPENDENT_AMBULATORY_CARE_PROVIDER_SITE_OTHER): Payer: Medicare Other | Admitting: Family Medicine

## 2018-10-14 VITALS — BP 110/80 | HR 91 | Resp 12 | Ht 63.0 in | Wt 195.1 lb

## 2018-10-14 DIAGNOSIS — Z309 Encounter for contraceptive management, unspecified: Secondary | ICD-10-CM | POA: Diagnosis not present

## 2018-10-14 DIAGNOSIS — F324 Major depressive disorder, single episode, in partial remission: Secondary | ICD-10-CM

## 2018-10-14 DIAGNOSIS — E669 Obesity, unspecified: Secondary | ICD-10-CM

## 2018-10-14 DIAGNOSIS — I10 Essential (primary) hypertension: Secondary | ICD-10-CM

## 2018-10-14 DIAGNOSIS — E890 Postprocedural hypothyroidism: Secondary | ICD-10-CM | POA: Diagnosis not present

## 2018-10-14 MED ORDER — MEDROXYPROGESTERONE ACETATE 150 MG/ML IM SUSP
150.0000 mg | Freq: Once | INTRAMUSCULAR | Status: AC
  Administered 2018-10-14: 150 mg via INTRAMUSCULAR

## 2018-10-14 NOTE — Patient Instructions (Signed)
F./u in 12 weeks, call if you need me before , MD visit and depo provera   Need dentist/ orthodontist to adjust brace for oral ulcer    It is important that you exercise regularly at least 30 minutes 5 times a week. If you develop chest pain, have severe difficulty breathing, or feel very tired, stop exercising immediately and seek medical attention    Think about what you will eat, plan ahead. Choose " clean, green, fresh or frozen" over canned, processed or packaged foods which are more sugary, salty and fatty. 70 to 75% of food eaten should be vegetables and fruit. Three meals at set times with snacks allowed between meals, but they must be fruit or vegetables. Aim to eat over a 12 hour period , example 7 am to 7 pm, and STOP after  your last meal of the day. Drink water,generally about 64 ounces per day, no other drink is as healthy. Fruit juice is best enjoyed in a healthy way, by EATING the fruit.

## 2018-10-17 ENCOUNTER — Encounter: Payer: Self-pay | Admitting: Family Medicine

## 2018-10-17 NOTE — Assessment & Plan Note (Signed)
Controlled, no change in medication DASH diet and commitment to daily physical activity for a minimum of 30 minutes discussed and encouraged, as a part of hypertension management. The importance of attaining a healthy weight is also discussed.  BP/Weight 10/14/2018 10/05/2018 09/14/2018 08/17/2018 08/12/2018 06/10/2018 44/51/4604  Systolic BP 799 872 158 727 618 485 927  Diastolic BP 80 89 85 90 88 84 87  Wt. (Lbs) 195.08 195 193 195.08 194 198.04 -  BMI 34.56 34.54 34.19 34.56 34.37 35.08 -

## 2018-10-17 NOTE — Assessment & Plan Note (Signed)
Adequately treated on current medication , continue same

## 2018-10-17 NOTE — Assessment & Plan Note (Signed)
Urine pregnancy test in office is negative Depo Provera 150 mg IM administered , needs 12 week follow up

## 2018-10-17 NOTE — Progress Notes (Signed)
Martha Montgomery     MRN: [id number]      DOB: [birth year]   HPI Ms. Kuehne is here for follow up and re-evaluation of chronic medical conditions, medication management and review of any available recent lab and radiology data.  Preventive health is updated, specifically  Cancer screening and Immunization.   Questions or concerns regarding consultations or procedures which the PT has had in the interim are  addressed. The PT denies any adverse reactions to current medications since the last visit.  Late with her depo provera, needs urine pregnancy test prior to resuming States BP was elevated at neurology visit yesterday C/o sore in mouth where braces re pressing on gum, antibiotic initiated yesterday, has upcoming appt with dentist  ROS Denies recent fever or chills. Denies sinus pressure, nasal congestion, ear pain or sore throat. Denies chest congestion, productive cough or wheezing. Denies chest pains, palpitations and leg swelling Denies abdominal pain, nausea, vomiting,diarrhea or constipation.   Denies dysuria, frequency, hesitancy or incontinence. Denies joint pain, swelling and limitation in mobility. Denies uncontrolled  headaches, seizures, numbness, or tingling. Denies uncontrolled  depression, anxiety or insomnia.    PE  BP 110/80   Pulse 91   Resp 12   Ht 5\' 3"  (1.6 m)   Wt 195 lb 1.3 oz (88.5 kg)   SpO2 97% Comment: room air  BMI 34.56 kg/m   Patient alert and oriented and in no cardiopulmonary distress.In discomfort due to denal trauma  HEENT: No facial asymmetry, EOMI,   oropharynx pink and moist.  Neck supple no JVD, no mass.  Chest: Clear to auscultation bilaterally.  CVS: S1, S2 no murmurs, no S3.Regular rate.  ABD: Soft non tender.   Ext: No edema  MS: Adequate ROM spine, shoulders, hips and knees.  Skin: Intact, no ulcerations or rash noted.  Psych: Good eye contact, normal affect. Memory intact not anxious or depressed appearing.  CNS: CN  2-12 intact, power,  normal throughout.no focal deficits noted.   Assessment & Plan  Encounter for contraceptive management Urine pregnancy test in office is negative Depo Provera 150 mg IM administered , needs 12 week follow up  HTN (hypertension), benign Controlled, no change in medication DASH diet and commitment to daily physical activity for a minimum of 30 minutes discussed and encouraged, as a part of hypertension management. The importance of attaining a healthy weight is also discussed.  BP/Weight 10/14/2018 10/05/2018 09/14/2018 08/17/2018 08/12/2018 06/10/2018 42/68/3419  Systolic BP 622 297 989 211 941 740 814  Diastolic BP 80 89 85 90 88 84 87  Wt. (Lbs) 195.08 195 193 195.08 194 198.04 -  BMI 34.56 34.54 34.19 34.56 34.37 35.08 -       Obesity (BMI 30.0-34.9) Unchanged.  Patient re-educated about  the importance of commitment to a  minimum of 150 minutes of exercise per week as able.  The importance of healthy food choices with portion control discussed, as well as eating regularly and within a 12 hour window most days. The need to choose "clean , green" food 50 to 75% of the time is discussed, as well as to make water the primary drink and set a goal of 64 ounces water daily.  Encouraged to start a food diary,  and to consider  joining a support group. Sample diet sheets offered. Goals set by the patient for the next several months.   Weight /BMI 10/14/2018 10/05/2018 09/14/2018  WEIGHT 195 lb 1.3 oz 195 lb 193 lb  HEIGHT 5\' 3"  5\' 3"  5\' 3"   BMI 34.56 kg/m2 34.54 kg/m2 34.19 kg/m2      Post-surgical hypothyroidism Adequately controlled, managed by Endo  Depression, major, single episode, in partial remission ([place]) Adequately treated on current medication , continue same

## 2018-10-17 NOTE — Assessment & Plan Note (Signed)
Adequately controlled, managed by Endo

## 2018-10-17 NOTE — Assessment & Plan Note (Signed)
Unchanged.  Patient re-educated about  the importance of commitment to a  minimum of 150 minutes of exercise per week as able.  The importance of healthy food choices with portion control discussed, as well as eating regularly and within a 12 hour window most days. The need to choose "clean , green" food 50 to 75% of the time is discussed, as well as to make water the primary drink and set a goal of 64 ounces water daily.  Encouraged to start a food diary,  and to consider  joining a support group. Sample diet sheets offered. Goals set by the patient for the next several months.   Weight /BMI 10/14/2018 10/05/2018 09/14/2018  WEIGHT 195 lb 1.3 oz 195 lb 193 lb  HEIGHT 5\' 3"  5\' 3"  5\' 3"   BMI 34.56 kg/m2 34.54 kg/m2 34.19 kg/m2

## 2018-10-18 LAB — POCT URINE PREGNANCY: Preg Test, Ur: NEGATIVE

## 2018-11-19 IMAGING — US US THYROID
1 series · 11 of 11 positions shown · non-contrast
Comparison: None.

CLINICAL DATA: 37-year-old female with a history of thyroid
resection

EXAM:
THYROID ULTRASOUND
TECHNIQUE: Ultrasound examination of the thyroid gland and adjacent soft
tissues was performed.

[Series 1: us thyroid · 11 of 11 slices shown]
[im 1/11]
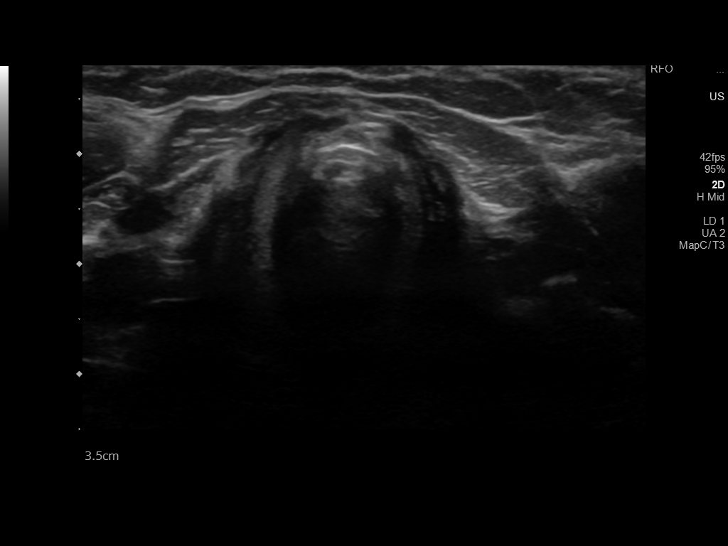
[im 2/11]
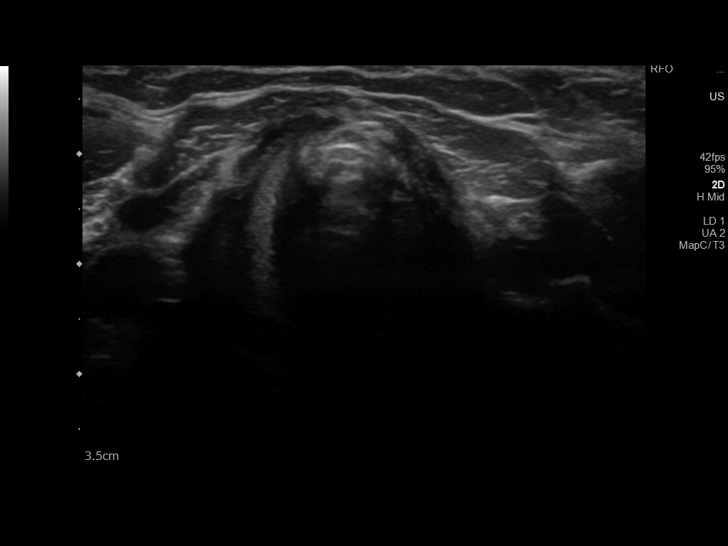
[im 3/11]
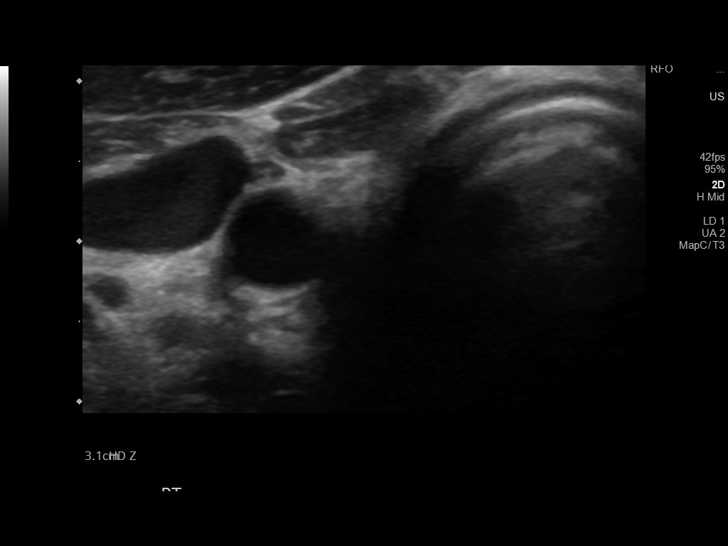
[im 4/11]
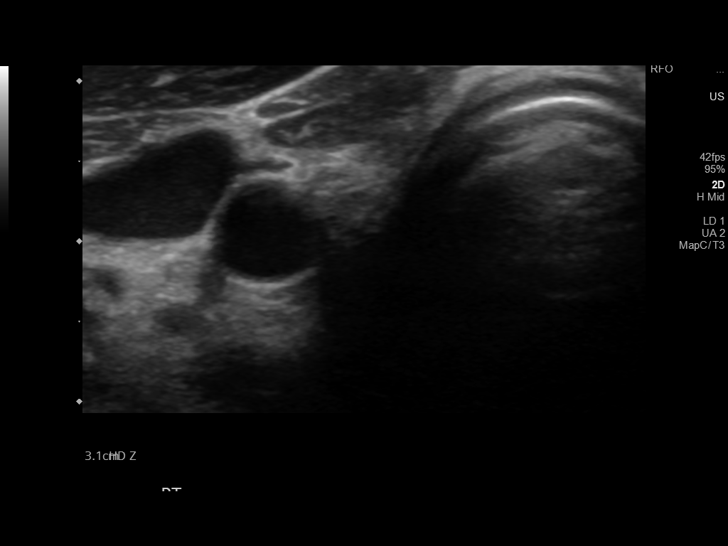
[im 5/11]
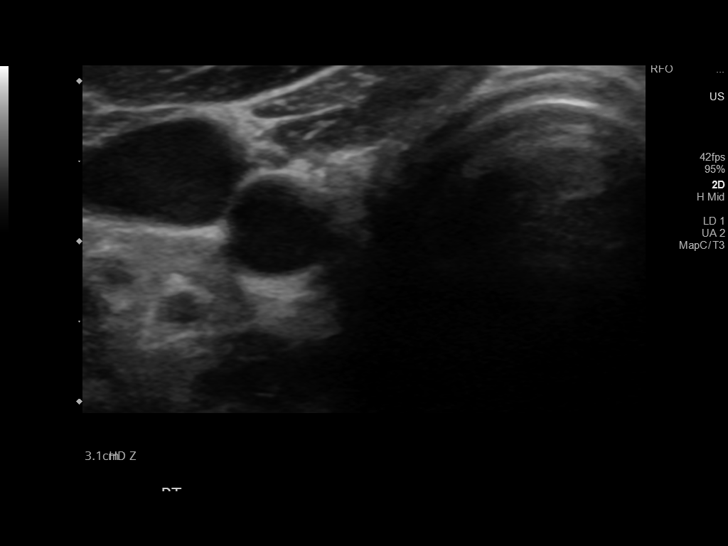
[im 6/11]
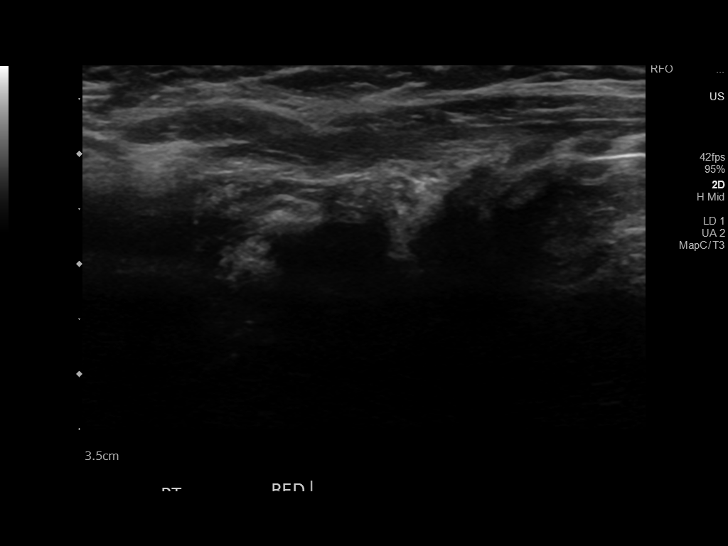
[im 7/11]
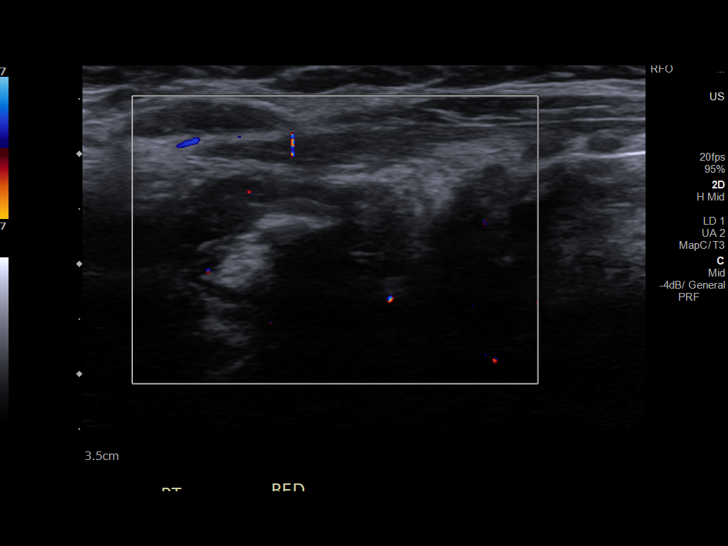
[im 8/11]
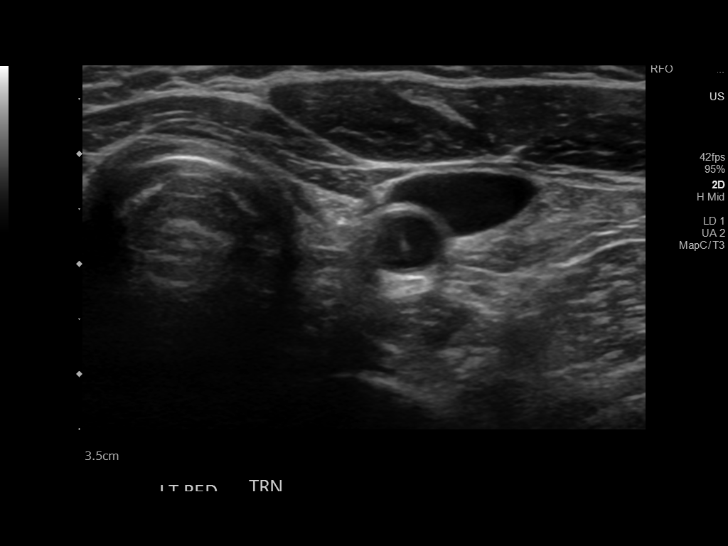
[im 9/11]
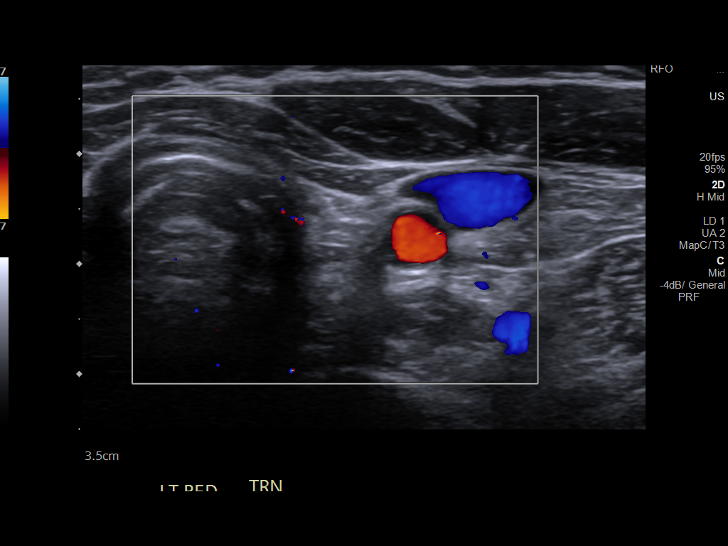
[im 10/11]
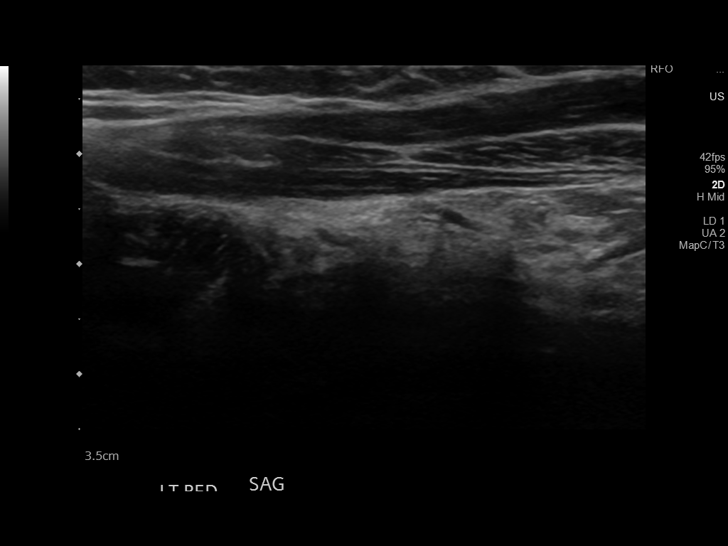
[im 11/11]
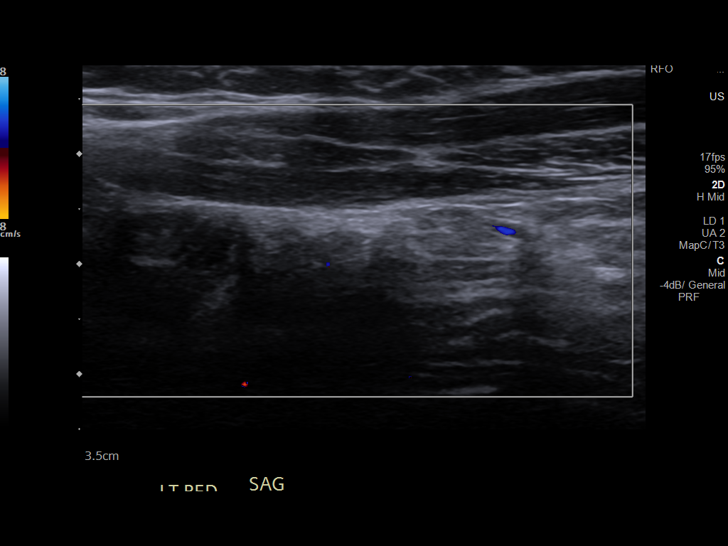

[11 of 11 positions shown; findings below may reference images not displayed]

FINDINGS: Surgical changes of total thyroidectomy.

No residual soft tissue in the surgical thyroid bed.

No adenopathy.
IMPRESSION: Surgical changes of total thyroidectomy, with no residual thyroid
tissue identified on the sonographic survey.

## 2018-11-22 ENCOUNTER — Other Ambulatory Visit: Payer: Self-pay

## 2018-11-22 ENCOUNTER — Encounter: Payer: Self-pay | Admitting: Gastroenterology

## 2018-11-22 ENCOUNTER — Encounter: Payer: Self-pay | Admitting: Nurse Practitioner

## 2018-11-22 ENCOUNTER — Ambulatory Visit (INDEPENDENT_AMBULATORY_CARE_PROVIDER_SITE_OTHER): Payer: Medicare Other | Admitting: Nurse Practitioner

## 2018-11-22 DIAGNOSIS — K625 Hemorrhage of anus and rectum: Secondary | ICD-10-CM

## 2018-11-22 DIAGNOSIS — K581 Irritable bowel syndrome with constipation: Secondary | ICD-10-CM | POA: Diagnosis not present

## 2018-11-22 NOTE — Progress Notes (Signed)
cc'ed to pcp °

## 2018-11-22 NOTE — Assessment & Plan Note (Signed)
IBS constipation.  She was previously having diarrhea on Amitiza twice a day so she cut back to once a day.  She is now having bloating, abdominal discomfort, and urinary retention (per her description).  Likely having worsening constipation.  I recommended she increase her Amitiza to twice a day and call us with a progress report in 2 weeks.  Depending on how she does we can explore other medication titrations options.  Overall follow-up in 2 months to check on clinical progression.

## 2018-11-22 NOTE — Assessment & Plan Note (Signed)
Intermittent, low-volume/scant hematochezia.  More often on the toilet tissue.  Known internal hemorrhoids and has Anusol that she can use intermittently.  Continue to monitor and notify us of any large volume bleeding.  Follow-up in 2 months.

## 2018-11-22 NOTE — Progress Notes (Signed)
Referring Provider: Fayrene Helper, MD Primary Care Physician:  Fayrene Helper, MD Primary GI:  Dr. Oneida Alar  NOTE: Service was provided via telemedicine and was requested by the patient due to COVID-19 pandemic.  Method of visit: Zoom  Patient Location: Home  Provider Location: Office  Reason for Phone Visit: Follow-up  The patient was consented to phone follow-up via telephone encounter including billing of the encounter (yes/no): Yes  Persons present on the phone encounter, with roles: Father  Total time (minutes) spent on medical discussion: 21 minutes  Chief Complaint  Patient presents with  . Hemorrhoids    Pt feels like she has a blockage,Abd bloating    HPI:   Martha Montgomery is a 38 y.o. female who presents for virtual visit regarding: Follow-up on hemorrhoids.  The patient was last seen in our office 05/19/2018 for rectal bleeding, hemorrhoids, constipation.  Colonoscopy up-to-date 2019 with which initially found a poor prep and repeat today after found normal exam and rectal bleeding due to internal hemorrhoids.  Recommended 10-year repeat exam (2029).  At her last visit she was having some persistent rectal bleeding due to hemorrhoids.  Some bouts of diarrhea with associated hemorrhoid flares while on Linzess with reduction in dose of morphine.  Does not feel like Preparation H can reach her internal hemorrhoids, states that sometimes protrude.  Has had intentional weight loss of 15 pounds in 8 months with diet and exercise.  Recommended Anusol rectal cream, titrate dose of amities up based on response, follow-up in 6 months.  Today she states she's doing ok overall. Her hemorrhoids have improved on Annusol. Still with bloating and 'retaining fluid" and thinks it's from Lyrica. States she will feel like she completely empties her bladder. After that if she has a bowel movement she will urinate as well (despite no sensation of needing to urinate.) Has intermittent  abdominal pain lower abdomen, described as dull or sharp (at times, when having a bowel movement). Pain typically resolves after a bowel movement. Denies N/V. Has intermittent hematochezia with known hemorrhoids, although denies overt hemorrhoid symptoms. Blood is in the stool and, more often, on the paper. Has a bowel movement about once a day. Stools are hard, difficulty passing. Tries to drink more water. States if she tries to drink too much water it comes back up. Is taking Amitiza once a day only due to previous diarrhea. Denies chest pain, dyspnea, dizziness, lightheadedness, syncope, near syncope. Denies any other upper or lower GI symptoms.  Past Medical History:  Diagnosis Date  . Anxiety   . Anxiety disorder   . Arthritis    "knees, joints, hands, toes" (09/28/2013)  . Depression   . Fibromyalgia   . Gastritis nov. 2011   EGD by Dr. Oneida Alar, negative h pylori  . GERD (gastroesophageal reflux disease)   . Gout   . Hypertension x 6 years   . Hypothyroidism   . Migraines    "15/month average" (09/28/2013)  . Spinal stenosis   . Thyroid goiter     Past Surgical History:  Procedure Laterality Date  . BIOPSY  10/20/2017   Procedure: BIOPSY;  Surgeon: Danie Binder, MD;  Location: [facility];  Service: Endoscopy;;  gastric duodenal esophagus  . CESAREAN SECTION  2008  . CHONDROPLASTY  04/02/2012   Procedure: CHONDROPLASTY;  Surgeon: Carole Civil, MD;  Location: [facility];  Service: Orthopedics;  Laterality: Left;  of left patella  . COLONOSCOPY  May 2012  Dr. Oneida Alar: normal colon, small internal hemorrhoids  . COLONOSCOPY WITH PROPOFOL N/A 10/22/2017   Procedure: COLONOSCOPY WITH PROPOFOL;  Surgeon: Danie Binder, MD;  Location: [facility];  Service: Endoscopy;  Laterality: N/A;  7:30am  . COLONOSCOPY WITH PROPOFOL N/A 10/20/2017   Procedure: COLONOSCOPY WITH PROPOFOL;  Surgeon: Danie Binder, MD;  Location: [facility];  Service: Endoscopy;  Laterality: N/A;  11:00am   . ESOPHAGOGASTRODUODENOSCOPY (EGD) WITH PROPOFOL N/A 10/20/2017   Procedure: ESOPHAGOGASTRODUODENOSCOPY (EGD) WITH PROPOFOL;  Surgeon: Danie Binder, MD;  Location: [facility];  Service: Endoscopy;  Laterality: N/A;  . INCISION AND DRAINAGE ABSCESS  1997; 2005   "under right buttocks; under left arm"  . SAVORY DILATION N/A 10/20/2017   Procedure: SAVORY DILATION;  Surgeon: Danie Binder, MD;  Location: [facility];  Service: Endoscopy;  Laterality: N/A;  . THYROIDECTOMY N/A 09/28/2013   Procedure: TOTAL THYROIDECTOMY;  Surgeon: Ascencion Dike, MD;  Location: [facility];  Service: ENT;  Laterality: N/A;  . TOTAL THYROIDECTOMY  09/27/2013  . UPPER GI ENDOSCOPY  2011   EGD: gastritis, no h.pylori    Current Outpatient Medications  Medication Sig Dispense Refill  . amLODipine (NORVASC) 2.5 MG tablet Take 1 tablet (2.5 mg total) by mouth daily. 90 tablet 3  . fluticasone (FLONASE) 50 MCG/ACT nasal spray Place 1 spray into both nostrils daily as needed for allergies or rhinitis.    . hydrochlorothiazide (MICROZIDE) 12.5 MG capsule Take 1 capsule (12.5 mg total) by mouth daily. 90 capsule 3  . hydrocortisone (ANUSOL-HC) 2.5 % rectal cream Place 1 application rectally 2 (two) times daily. for up to 10 days at a time. 30 g 1  . levothyroxine (SYNTHROID, LEVOTHROID) 200 MCG tablet TAKE 1 TABLET BY MOUTH EVERY DAY BEFORE BREAKFAST 90 tablet 2  . lubiprostone (AMITIZA) 24 MCG capsule Take 1 capsule (24 mcg total) by mouth 2 (two) times daily with a meal. 180 capsule 3  . medroxyPROGESTERone (DEPO-PROVERA) 150 MG/ML injection Inject 150 mg into the muscle every 3 (three) months.    . metoprolol tartrate (LOPRESSOR) 50 MG tablet Take 1 tablet (50 mg total) by mouth 2 (two) times daily. 180 tablet 1  . pantoprazole (PROTONIX) 40 MG tablet Take 1 tablet (40 mg total) by mouth daily. Take 30 minutes before breakfast (Patient taking differently: Take 40 mg by mouth daily as needed (pain). Take 30 minutes before  breakfast) 30 tablet 3  . potassium chloride (K-DUR) 10 MEQ tablet Take 1 tablet (10 mEq total) by mouth daily. 30 tablet 3  . pregabalin (LYRICA) 100 MG capsule Take 100 mg by mouth 3 (three) times daily.    . traMADol (ULTRAM) 50 MG tablet Take by mouth every 12 (twelve) hours as needed.     No current facility-administered medications for this visit.     Allergies as of 11/22/2018 - Review Complete 11/22/2018  Allergen Reaction Noted  . Codeine Itching   . Ibuprofen Other (See Comments) 09/02/2016  . Soybean-containing drug products Other (See Comments) 12/10/2010    Family History  Problem Relation Age of Onset  . Arthritis Mother   . Migraines Mother   . Hypertension Mother   . Cancer Maternal Grandmother        lung  . Asthma Other        family history   . Colon cancer Neg Hx     Social History   Socioeconomic History  . Marital status: Legally Separated  Spouse name: Not on file  . Number of children: 1  . Years of education: 18  . Highest education level: 12th grade  Occupational History  . Occupation: Hospital doctor: UNEMPLOYED  Social Needs  . Financial resource strain: Somewhat hard  . Food insecurity:    Worry: Sometimes true    Inability: Sometimes true  . Transportation needs:    Medical: No    Non-medical: No  Tobacco Use  . Smoking status: Former Smoker    Packs/day: 1.00    Years: 5.00    Pack years: 5.00    Types: E-cigarettes    Last attempt to quit: 09/11/2010    Years since quitting: 8.2  . Smokeless tobacco: Never Used  . Tobacco comment: QUIT SMOKING X 3 YEARS AGO,Vaping now  Substance and Sexual Activity  . Alcohol use: No  . Drug use: No  . Sexual activity: Not Currently    Partners: Male    Birth control/protection: Other-see comments, Injection    Comment: Depo  Lifestyle  . Physical activity:    Days per week: 2 days    Minutes per session: 20 min  . Stress: Only a  little  Relationships  . Social connections:    Talks on phone: More than three times a week    Gets together: More than three times a week    Attends religious service: 1 to 4 times per year    Active member of club or organization: Yes    Attends meetings of clubs or organizations: 1 to 4 times per year    Relationship status: Married  Other Topics Concern  . Not on file  Social History Narrative   Pt is currently on medicaid. From [place] and came to [place] in mid 2010   Is separated from Husband, lives with Mother right now     Review of Systems: Complete ROS negative except as per HPI.  Physical Exam: Note: limited exam due to virtual visit General:   Alert and oriented. Pleasant and cooperative. Well-nourished and well-developed.  Head:  Normocephalic and atraumatic. Eyes:  Without icterus, sclera clear and conjunctiva pink.  Ears:  Normal auditory acuity. Skin:  Intact without facial significant lesions or rashes. Neurologic:  Alert and oriented x4;  grossly normal neurologically. Psych:  Alert and cooperative. Normal mood and affect. Heme/Lymph/Immune: No excessive bruising noted.

## 2018-11-22 NOTE — Patient Instructions (Signed)
Your health issues we discussed today were:   Constipation with bloating and abdominal discomfort: 1. As we discussed, increase your Amitiza to twice a day.  Take this on a full stomach/after meals 2. Call us in 2 weeks and let us know how you are doing 3. Call with any severe or worsening symptoms  Overall I recommend:  1. Follow-up in 2 months 2. Call us if you have any questions or concerns 3. Continue your other medications   Because of recent events of COVID-19 ("Coronavirus"), follow CDC recommendations:  1. Wash your hand frequently 2. Avoid touching your face 3. Stay away from people who are sick 4. If you have symptoms such as fever, cough, shortness of breath then call your healthcare provider for further guidance 5. If you are sick, STAY AT HOME unless otherwise directed by your healthcare provider. 6. Follow directions from state and national officials regarding staying safe    At [facility] we value your feedback. You may receive a survey about your visit today. Please share your experience as we strive to create trusting relationships with our patients to provide genuine, compassionate, quality care.  We appreciate your understanding and patience as we review any laboratory studies, imaging, and other diagnostic tests that are ordered as we care for you. Our office policy is 5 business days for review of these results, and any emergent or urgent results are addressed in a timely manner for your best interest. If you do not hear from our office in 1 week, please contact us.   We also encourage the use of MyChart, which contains your medical information for your review as well. If you are not enrolled in this feature, an access code is on this after visit summary for your convenience. Thank you for allowing [place] to be involved in your care.  It was great to see you today!  I hope you have a great day!!

## 2018-12-17 ENCOUNTER — Encounter: Payer: Self-pay | Admitting: Family Medicine

## 2018-12-19 ENCOUNTER — Other Ambulatory Visit: Payer: Self-pay | Admitting: Family Medicine

## 2018-12-19 MED ORDER — CEPHALEXIN 500 MG PO CAPS: 500.0000 mg | ORAL_CAPSULE | Freq: Three times a day (TID) | ORAL | 0 refills | Status: AC

## 2018-12-19 NOTE — Progress Notes (Signed)
Keflex

## 2019-01-05 ENCOUNTER — Ambulatory Visit (INDEPENDENT_AMBULATORY_CARE_PROVIDER_SITE_OTHER): Payer: Medicare Other | Admitting: Family Medicine

## 2019-01-05 ENCOUNTER — Encounter: Payer: Self-pay | Admitting: Family Medicine

## 2019-01-05 ENCOUNTER — Other Ambulatory Visit: Payer: Self-pay

## 2019-01-05 ENCOUNTER — Ambulatory Visit: Payer: Medicare Other | Admitting: "Endocrinology

## 2019-01-05 VITALS — BP 122/80 | HR 93 | Temp 98.3°F | Resp 15 | Ht 63.0 in | Wt 204.0 lb

## 2019-01-05 DIAGNOSIS — M25569 Pain in unspecified knee: Secondary | ICD-10-CM | POA: Diagnosis not present

## 2019-01-05 DIAGNOSIS — Z308 Encounter for other contraceptive management: Secondary | ICD-10-CM

## 2019-01-05 DIAGNOSIS — M130 Polyarthritis, unspecified: Secondary | ICD-10-CM | POA: Diagnosis not present

## 2019-01-05 DIAGNOSIS — I10 Essential (primary) hypertension: Secondary | ICD-10-CM

## 2019-01-05 DIAGNOSIS — F324 Major depressive disorder, single episode, in partial remission: Secondary | ICD-10-CM | POA: Diagnosis not present

## 2019-01-05 DIAGNOSIS — M797 Fibromyalgia: Secondary | ICD-10-CM | POA: Diagnosis not present

## 2019-01-05 MED ORDER — MEDROXYPROGESTERONE ACETATE 150 MG/ML IM SUSP
150.0000 mg | Freq: Once | INTRAMUSCULAR | Status: AC
  Administered 2019-01-05: 150 mg via INTRAMUSCULAR

## 2019-01-05 NOTE — Patient Instructions (Signed)
F/U in 12 weeks, MD visit and Depo provera Depo  Today if due Excellent BP  It is important that you exercise regularly at least 30 minutes 5 times a week. If you develop chest pain, have severe difficulty breathing, or feel very tired, stop exercising immediately and seek medical attention    Think about what you will eat, plan ahead. Choose " clean, green, fresh or frozen" over canned, processed or packaged foods which are more sugary, salty and fatty. 70 to 75% of food eaten should be vegetables and fruit. Three meals at set times with snacks allowed between meals, but they must be fruit or vegetables. Aim to eat over a 12 hour period , example 7 am to 7 pm, and STOP after  your last meal of the day. Drink water,generally about 64 ounces per day, no other drink is as healthy. Fruit juice is best enjoyed in a healthy way, by EATING the fruit.   Fasting lipid, chem 7 and EGFR, TSH 1 week before visit

## 2019-01-06 ENCOUNTER — Ambulatory Visit: Payer: Medicare Other | Admitting: Family Medicine

## 2019-01-08 ENCOUNTER — Encounter: Payer: Self-pay | Admitting: Family Medicine

## 2019-01-08 DIAGNOSIS — E669 Obesity, unspecified: Secondary | ICD-10-CM | POA: Insufficient documentation

## 2019-01-08 NOTE — Assessment & Plan Note (Signed)
Obesity linked with hypertension and depression  Patient re-educated about  the importance of commitment to a  minimum of 150 minutes of exercise per week as able.  The importance of healthy food choices with portion control discussed, as well as eating regularly and within a 12 hour window most days. The need to choose "clean , green" food 50 to 75% of the time is discussed, as well as to make water the primary drink and set a goal of 64 ounces water daily.    Weight /BMI 01/05/2019 10/14/2018 10/05/2018  WEIGHT 204 lb 195 lb 1.3 oz 195 lb  HEIGHT 5\' 3"  5\' 3"  5\' 3"   BMI 36.14 kg/m2 34.56 kg/m2 34.54 kg/m2

## 2019-01-08 NOTE — Assessment & Plan Note (Signed)
Controlled, no change in medication DASH diet and commitment to daily physical activity for a minimum of 30 minutes discussed and encouraged, as a part of hypertension management. The importance of attaining a healthy weight is also discussed.  BP/Weight 01/05/2019 10/14/2018 10/05/2018 09/14/2018 08/17/2018 08/12/2018 16/74/2552  Systolic BP 589 [phone]  Diastolic BP 80 80 89 85 90 88 84  Wt. (Lbs) 204 195.08 195 193 195.08 194 198.04  BMI 36.14 34.56 34.54 34.19 34.56 34.37 35.08

## 2019-01-08 NOTE — Assessment & Plan Note (Signed)
Depo 150 mg iM administered

## 2019-01-08 NOTE — Assessment & Plan Note (Signed)
Controlled, no change in medication  

## 2019-01-08 NOTE — Progress Notes (Signed)
   Martha Montgomery     MRN: [id number]      DOB: 1980-08-26   HPI [name] is here for follow up and re-evaluation of chronic medical conditions, medication management and review of any available recent lab and radiology data.  Preventive health is updated, specifically  Cancer screening and Immunization.   Questions or concerns regarding consultations or procedures which the PT has had in the interim are  addressed. The PT denies any adverse reactions to current medications since the last visit.  There are no new concerns.  There are no specific complaints  States increased commitment to exercise, has good food choice but over eats unhealthy snacks will work on this   ROS Denies recent fever or chills. Denies sinus pressure, nasal congestion, ear pain or sore throat. Denies chest congestion, productive cough or wheezing. Denies chest pains, palpitations and leg swelling Denies abdominal pain, nausea, vomiting,diarrhea or constipation.   Denies dysuria, frequency, hesitancy or incontinence. Denies joint pain, swelling and limitation in mobility. Denies headaches, seizures, numbness, or tingling. Denies depression, anxiety or insomnia. Denies skin break down or rash.   PE  BP 122/80   Pulse 93   Temp 98.3 F (36.8 C) (Temporal)   Resp 15   Ht 5\' 3"  (1.6 m)   Wt 204 lb (92.5 kg)   SpO2 98%   BMI 36.14 kg/m   Patient alert and oriented and in no cardiopulmonary distress.  HEENT: No facial asymmetry, EOMI,   oropharynx pink and moist.  Neck supple no JVD, no mass.  Chest: Clear to auscultation bilaterally.  CVS: S1, S2 no murmurs, no S3.Regular rate.  ABD: Soft non tender.   Ext: No edema  MS: Adequate ROM spine, shoulders, hips and knees.  Skin: Intact, no ulcerations or rash noted.  Psych: Good eye contact, normal affect. Memory intact not anxious or depressed appearing.  CNS: CN 2-12 intact, power,  normal throughout.no focal deficits noted.   Assessment &  Plan  Encounter for contraceptive management Depo 150 mg iM administered  Depression, major, single episode, in partial remission (HCC) Controlled, no change in medication   HTN (hypertension), benign Controlled, no change in medication DASH diet and commitment to daily physical activity for a minimum of 30 minutes discussed and encouraged, as a part of hypertension management. The importance of attaining a healthy weight is also discussed.  BP/Weight 01/05/2019 10/14/2018 10/05/2018 09/14/2018 08/17/2018 08/12/2018 92/42/6834  Systolic BP 196 222 979 892 119 417 408  Diastolic BP 80 80 89 85 90 88 84  Wt. (Lbs) 204 195.08 195 193 195.08 194 198.04  BMI 36.14 34.56 34.54 34.19 34.56 34.37 35.08       Morbid obesity (HCC) Obesity linked with hypertension and depression  Patient re-educated about  the importance of commitment to a  minimum of 150 minutes of exercise per week as able.  The importance of healthy food choices with portion control discussed, as well as eating regularly and within a 12 hour window most days. The need to choose "clean , green" food 50 to 75% of the time is discussed, as well as to make water the primary drink and set a goal of 64 ounces water daily.    Weight /BMI 01/05/2019 10/14/2018 10/05/2018  WEIGHT 204 lb 195 lb 1.3 oz 195 lb  HEIGHT 5\' 3"  5\' 3"  5\' 3"   BMI 36.14 kg/m2 34.56 kg/m2 34.54 kg/m2

## 2019-01-21 ENCOUNTER — Ambulatory Visit: Payer: Medicare Other | Admitting: "Endocrinology

## 2019-02-08 ENCOUNTER — Ambulatory Visit (INDEPENDENT_AMBULATORY_CARE_PROVIDER_SITE_OTHER): Payer: Medicare Other | Admitting: Nurse Practitioner

## 2019-02-08 ENCOUNTER — Other Ambulatory Visit: Payer: Self-pay

## 2019-02-08 ENCOUNTER — Encounter: Payer: Self-pay | Admitting: Nurse Practitioner

## 2019-02-08 VITALS — BP 115/79 | HR 78 | Temp 98.8°F | Ht 63.0 in | Wt 202.4 lb

## 2019-02-08 DIAGNOSIS — K219 Gastro-esophageal reflux disease without esophagitis: Secondary | ICD-10-CM

## 2019-02-08 DIAGNOSIS — K581 Irritable bowel syndrome with constipation: Secondary | ICD-10-CM | POA: Diagnosis not present

## 2019-02-08 MED ORDER — PANTOPRAZOLE SODIUM 40 MG PO TBEC: 40.0000 mg | DELAYED_RELEASE_TABLET | Freq: Every day | ORAL | 3 refills | Status: DC

## 2019-02-08 NOTE — Assessment & Plan Note (Signed)
Currently well managed on once daily PPI.  Refilled as she is out of her medication.  Continue current regimen and follow-up in 6 months.

## 2019-02-08 NOTE — Assessment & Plan Note (Signed)
IBS constipation type much improved on twice daily Amitiza.  Typically has a bowel movement 1-2 times a day.  Occasionally will have a day where she does not have a bowel movement.  Overall she is emptying much better.  She is increased her fiber and water intake.  Recommend continue current medications and can add MiraLAX on days where she has breakthrough constipation.  Otherwise follow-up in 6 months.

## 2019-02-08 NOTE — Progress Notes (Signed)
Referring Provider: Fayrene Helper, MD Primary Care Physician:  Fayrene Helper, MD Primary GI:  Dr. Oneida Alar  Chief Complaint  Patient presents with  . Constipation    sometimes unable to empty bowels completely    HPI:   Martha Montgomery is a 38 y.o. female who presents for follow-up on IBS constipation type and bloating.  The patient was last seen in our office 11/22/2018 for rectal bleeding and IBS constipation type.  The patient's last visit was a virtual visit due to COVID-19/coronavirus pandemic.  Colonoscopy up-to-date 2019 that was essentially normal and noted internal hemorrhoids.  Recommend 10-year repeat exam in 2029.  At her last visit she noted hemorrhoids improved on Anusol, still with bloating and "retaining fluid" and thinks it is due to her Lyrica.  Intermittent abdominal pain in the lower abdomen which typically resolves after a bowel movement.  No nausea or vomiting.  Intermittent hematochezia with known hemorrhoids although denies overt hemorrhoid symptoms.  Bowel movement once a day with hard stools and difficulty passing.  Trying to drink more water.  On Amitiza once a day due to previous diarrhea on twice a day dosing.  No other GI complaints.  Recommended rechallenge Amitiza twice a day, follow-up in 2 weeks to let us know how she is doing and follow-up in 2 months.  No progress report was called per her request  Today she states she's doing better. No diarrhea with bid dosing of Amitiza. Is drinking more water. Having better bowel movements. Typically has a bowel movement daily with more complete emptying. Occasionally will skip a day but will not take anything at that time. She is also taking fiber supplements. Abdominal pain is much better, rarely occurs. No hematochezia in some time (known hemorrhoids). GERD well managed on daily Protonix, needs a refill. Denies melena, fever, chills, unintentional weight loss. Denies URI or flu-like symptoms. Denies loss of  sense of taste or smell. Denies chest pain, dyspnea, dizziness, lightheadedness, syncope, near syncope. Denies any other upper or lower GI symptoms.  Past Medical History:  Diagnosis Date  . Anxiety   . Anxiety disorder   . Arthritis    "knees, joints, hands, toes" (09/28/2013)  . Depression   . Fibromyalgia   . Gastritis nov. 2011   EGD by Dr. Oneida Alar, negative h pylori  . GERD (gastroesophageal reflux disease)   . Gout   . Hypertension x 6 years   . Hypothyroidism   . Migraines    "15/month average" (09/28/2013)  . Spinal stenosis   . Thyroid goiter     Past Surgical History:  Procedure Laterality Date  . BIOPSY  10/20/2017   Procedure: BIOPSY;  Surgeon: Danie Binder, MD;  [facility];  Service: Endoscopy;;  gastric duodenal esophagus  . CESAREAN SECTION  2008  . CHONDROPLASTY  04/02/2012   Procedure: CHONDROPLASTY;  Surgeon: Carole Civil, MD;  Location: [facility];  Service: Orthopedics;  Laterality: Left;  of left patella  . COLONOSCOPY  May 2012   Dr. Oneida Alar: normal colon, small internal hemorrhoids  . COLONOSCOPY WITH PROPOFOL N/A 10/22/2017   Procedure: COLONOSCOPY WITH PROPOFOL;  Surgeon: Danie Binder, MD;  [facility];  Service: Endoscopy;  Laterality: N/A;  7:30am  . COLONOSCOPY WITH PROPOFOL N/A 10/20/2017   Procedure: COLONOSCOPY WITH PROPOFOL;  Surgeon: Danie Binder, MD;  [facility]: [facility];  Service: Endoscopy;  Laterality: N/A;  11:00am  . ESOPHAGOGASTRODUODENOSCOPY (EGD) WITH PROPOFOL N/A  10/20/2017   Procedure: ESOPHAGOGASTRODUODENOSCOPY (EGD) WITH PROPOFOL;  Surgeon: Danie Binder, MD;  [facility];  Service: Endoscopy;  Laterality: N/A;  . INCISION AND DRAINAGE ABSCESS  1997; 2005   "under right buttocks; under left arm"  . SAVORY DILATION N/A 10/20/2017   Procedure: SAVORY DILATION;  Surgeon: Danie Binder, MD;  [facility]: [facility];  Service: Endoscopy;  Laterality: N/A;  . THYROIDECTOMY N/A 09/28/2013    Procedure: TOTAL THYROIDECTOMY;  Surgeon: Ascencion Dike, MD;  Location: [facility];  Service: ENT;  Laterality: N/A;  . TOTAL THYROIDECTOMY  09/27/2013  . UPPER GI ENDOSCOPY  2011   EGD: gastritis, no h.pylori    Current Outpatient Medications  Medication Sig Dispense Refill  . amLODipine (NORVASC) 2.5 MG tablet Take 1 tablet (2.5 mg total) by mouth daily. 90 tablet 3  . fluticasone (FLONASE) 50 MCG/ACT nasal spray Place 1 spray into both nostrils daily as needed for allergies or rhinitis.    . hydrochlorothiazide (MICROZIDE) 12.5 MG capsule Take 1 capsule (12.5 mg total) by mouth daily. 90 capsule 3  . hydrocortisone (ANUSOL-HC) 2.5 % rectal cream Place 1 application rectally 2 (two) times daily. for up to 10 days at a time. (Patient taking differently: Place 1 application rectally as needed. for up to 10 days at a time.) 30 g 1  . hydrOXYzine (ATARAX/VISTARIL) 10 MG tablet Take 10 mg by mouth 2 (two) times a day.    . levothyroxine (SYNTHROID, LEVOTHROID) 200 MCG tablet TAKE 1 TABLET BY MOUTH EVERY DAY BEFORE BREAKFAST 90 tablet 2  . lubiprostone (AMITIZA) 24 MCG capsule Take 1 capsule (24 mcg total) by mouth 2 (two) times daily with a meal. 180 capsule 3  . medroxyPROGESTERone (DEPO-PROVERA) 150 MG/ML injection Inject 150 mg into the muscle every 3 (three) months.    . metoprolol tartrate (LOPRESSOR) 50 MG tablet Take 1 tablet (50 mg total) by mouth 2 (two) times daily. 180 tablet 1  . pantoprazole (PROTONIX) 40 MG tablet Take 1 tablet (40 mg total) by mouth daily. Take 30 minutes before breakfast 90 tablet 3  . potassium chloride (K-DUR) 10 MEQ tablet Take 1 tablet (10 mEq total) by mouth daily. 30 tablet 3  . pregabalin (LYRICA) 100 MG capsule Take 100 mg by mouth 3 (three) times daily.    . traMADol (ULTRAM) 50 MG tablet Take by mouth every 12 (twelve) hours as needed.     No current facility-administered medications for this visit.     Allergies as of 02/08/2019 - Review Complete 02/08/2019   Allergen Reaction Noted  . Codeine Itching   . Ibuprofen Other (See Comments) 09/02/2016  . Soybean-containing drug products Other (See Comments) 12/10/2010    Family History  Problem Relation Age of Onset  . Arthritis Mother   . Migraines Mother   . Hypertension Mother   . Cancer Maternal Grandmother        lung  . Asthma Other        family history   . Colon cancer Neg Hx     Social History   Socioeconomic History  . Marital status: Legally Separated    Spouse name: Not on file  . Number of children: 1  . Years of education: 81  . Highest education level: 12th grade  Occupational History  . Occupation: Hospital doctor: UNEMPLOYED  Social Needs  . Financial resource strain: Somewhat hard  . Food insecurity  Worry: Sometimes true    Inability: Sometimes true  . Transportation needs    Medical: No    Non-medical: No  Tobacco Use  . Smoking status: Former Smoker    Packs/day: 1.00    Years: 5.00    Pack years: 5.00    Types: E-cigarettes    Quit date: 09/11/2010    Years since quitting: 8.4  . Smokeless tobacco: Never Used  . Tobacco comment: QUIT SMOKING X 3 YEARS AGO,Vaping now  Substance and Sexual Activity  . Alcohol use: No  . Drug use: No  . Sexual activity: Not Currently    Partners: Male    Birth control/protection: Other-see comments, Injection    Comment: Depo  Lifestyle  . Physical activity    Days per week: 2 days    Minutes per session: 20 min  . Stress: Only a little  Relationships  . Social connections    Talks on phone: More than three times a week    Gets together: More than three times a week    Attends religious service: 1 to 4 times per year    Active member of club or organization: Yes    Attends meetings of clubs or organizations: 1 to 4 times per year    Relationship status: Married  Other Topics Concern  . Not on file  Social History Narrative   Pt is currently on  medicaid. From [place] and came to [place] in mid 2010   Is separated from Husband, lives with Mother right now     Review of Systems: General: Negative for anorexia, weight loss, fever, chills, fatigue, weakness. Eyes: Negative for vision changes.  ENT: Negative for hoarseness, difficulty swallowing , nasal congestion. CV: Negative for chest pain, angina, palpitations, dyspnea on exertion, peripheral edema.  Respiratory: Negative for dyspnea at rest, dyspnea on exertion, cough, sputum, wheezing.  GI: See history of present illness. GU:  Negative for dysuria, hematuria, urinary incontinence, urinary frequency, nocturnal urination.  MS: Negative for joint pain, low back pain.  Derm: Negative for rash or itching.  Neuro: Negative for weakness, abnormal sensation, seizure, frequent headaches, memory loss, confusion.  Psych: Negative for anxiety, depression, suicidal ideation, hallucinations.  Endo: Negative for unusual weight change.  Heme: Negative for bruising or bleeding. Allergy: Negative for rash or hives.   Physical Exam: BP 115/79   Pulse 78   Temp 98.8 F (37.1 C) (Oral)   Ht 5\' 3"  (1.6 m)   Wt 202 lb 6.4 oz (91.8 kg)   BMI 35.85 kg/m  General:   Alert and oriented. Pleasant and cooperative. Well-nourished and well-developed.  Eyes:  Without icterus, sclera clear and conjunctiva pink.  Ears:  Normal auditory acuity. Cardiovascular:  S1, S2 present without murmurs appreciated. Extremities without clubbing or edema. Respiratory:  Clear to auscultation bilaterally. No wheezes, rales, or rhonchi. No distress.  Gastrointestinal:  +BS, soft, non-tender and non-distended. No HSM noted. No guarding or rebound. No masses appreciated.  Rectal:  Deferred  Musculoskalatal:  Symmetrical without gross deformities. Neurologic:  Alert and oriented x4;  grossly normal neurologically. Psych:  Alert and cooperative. Normal mood and affect. Heme/Lymph/Immune: No excessive bruising noted.     02/08/2019 8:38 AM   Disclaimer: This note was dictated with voice recognition software. Similar sounding words can inadvertently be transcribed and may not be corrected upon review.

## 2019-02-08 NOTE — Patient Instructions (Signed)
Your health issues we discussed today were:   GERD (heartburn/reflux): 1. I have refilled your Protonix 2. Continue taking it once a day 3. Call us if you have any worsening or severe symptoms  Irritable bowel syndrome, constipation type (IBS-C): 1. I am glad you are doing better exhalation point 2. Continue Amitiza twice daily 3. On days if you have constipation despite fiber, water, Amitiza then you can add a dose of MiraLAX to see if this helps 4. Call us if you have any worsening or severe symptoms  Overall I recommend:  1. Continue your other current medications 2. Follow-up in 6 months 3. Call to get any questions or concerns.   Because of recent events of COVID-19 ("Coronavirus"), follow CDC recommendations:  1. Wash your hand frequently 2. Avoid touching your face 3. Stay away from people who are sick 4. If you have symptoms such as fever, cough, shortness of breath then call your healthcare provider for further guidance 5. If you are sick, STAY AT HOME unless otherwise directed by your healthcare provider. 6. Follow directions from state and national officials regarding staying safe   At [facility] we value your feedback. You may receive a survey about your visit today. Please share your experience as we strive to create trusting relationships with our patients to provide genuine, compassionate, quality care.  We appreciate your understanding and patience as we review any laboratory studies, imaging, and other diagnostic tests that are ordered as we care for you. Our office policy is 5 business days for review of these results, and any emergent or urgent results are addressed in a timely manner for your best interest. If you do not hear from our office in 1 week, please contact us.   We also encourage the use of MyChart, which contains your medical information for your review as well. If you are not enrolled in this feature, an access code is on this after  visit summary for your convenience. Thank you for allowing [place] to be involved in your care.  It was great to see you today!  I hope you have a great summer!!

## 2019-02-09 NOTE — Progress Notes (Signed)
CC'D TO PCP °

## 2019-02-23 ENCOUNTER — Ambulatory Visit: Payer: Medicare Other | Admitting: "Endocrinology

## 2019-03-03 ENCOUNTER — Other Ambulatory Visit: Payer: Self-pay | Admitting: Family Medicine

## 2019-03-03 DIAGNOSIS — I10 Essential (primary) hypertension: Secondary | ICD-10-CM

## 2019-03-09 ENCOUNTER — Ambulatory Visit: Payer: Medicare Other | Admitting: "Endocrinology

## 2019-03-09 ENCOUNTER — Encounter: Payer: Self-pay | Admitting: "Endocrinology

## 2019-03-29 ENCOUNTER — Ambulatory Visit: Payer: Medicare Other | Admitting: Family Medicine

## 2019-03-30 DIAGNOSIS — M25569 Pain in unspecified knee: Secondary | ICD-10-CM | POA: Diagnosis not present

## 2019-03-30 DIAGNOSIS — M130 Polyarthritis, unspecified: Secondary | ICD-10-CM | POA: Diagnosis not present

## 2019-03-30 DIAGNOSIS — M797 Fibromyalgia: Secondary | ICD-10-CM | POA: Diagnosis not present

## 2019-04-01 ENCOUNTER — Encounter: Payer: Self-pay | Admitting: Family Medicine

## 2019-04-05 ENCOUNTER — Other Ambulatory Visit: Payer: Self-pay | Admitting: Family Medicine

## 2019-04-05 MED ORDER — FLUCONAZOLE 150 MG PO TABS: ORAL_TABLET | ORAL | 0 refills | Status: DC

## 2019-04-05 MED ORDER — CEPHALEXIN 500 MG PO CAPS: 500.0000 mg | ORAL_CAPSULE | Freq: Four times a day (QID) | ORAL | 0 refills | Status: DC

## 2019-04-08 IMAGING — MG DIGITAL DIAGNOSTIC BILATERAL MAMMOGRAM WITH TOMO AND CAD
6 of 12 series · 6 of 36 positions shown · non-contrast
Comparison: None.

CLINICAL DATA: 37-year-old female with bilateral breast palpable
areas of concern.

EXAM:
DIGITAL DIAGNOSTIC BILATERAL MAMMOGRAM WITH CAD AND TOMO
BILATERAL BREAST ULTRASOUND

[R MLO synth-2D (1 of 2)]
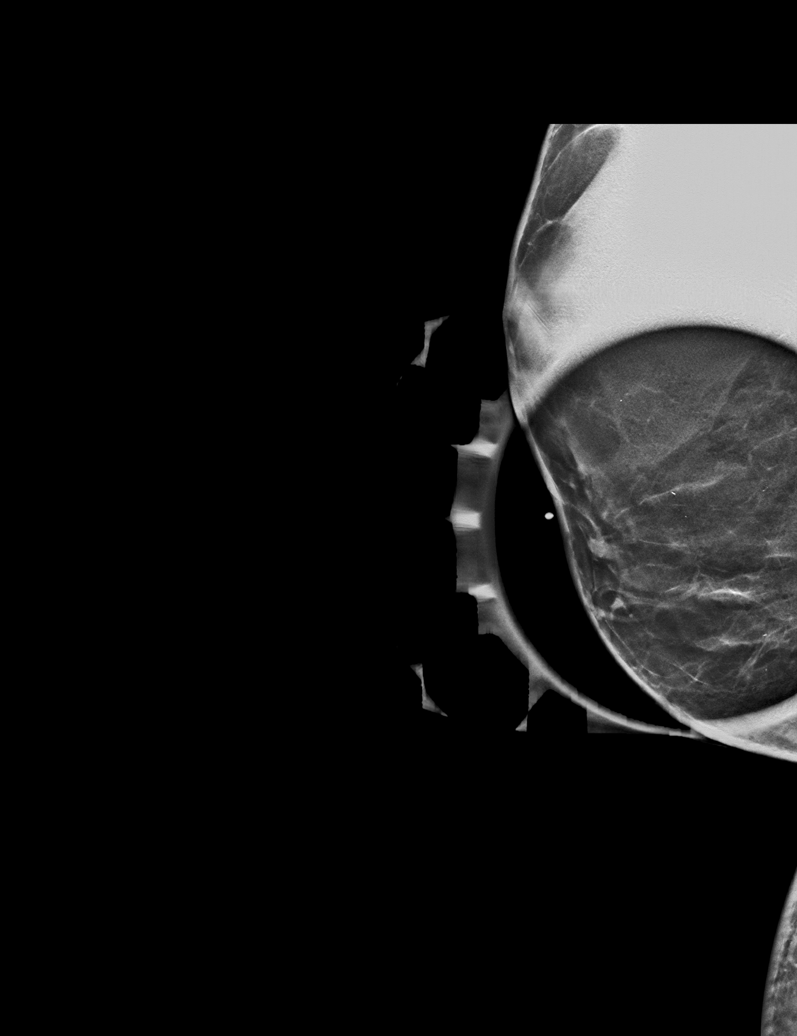

[L MLO synth-2D (1 of 2)]
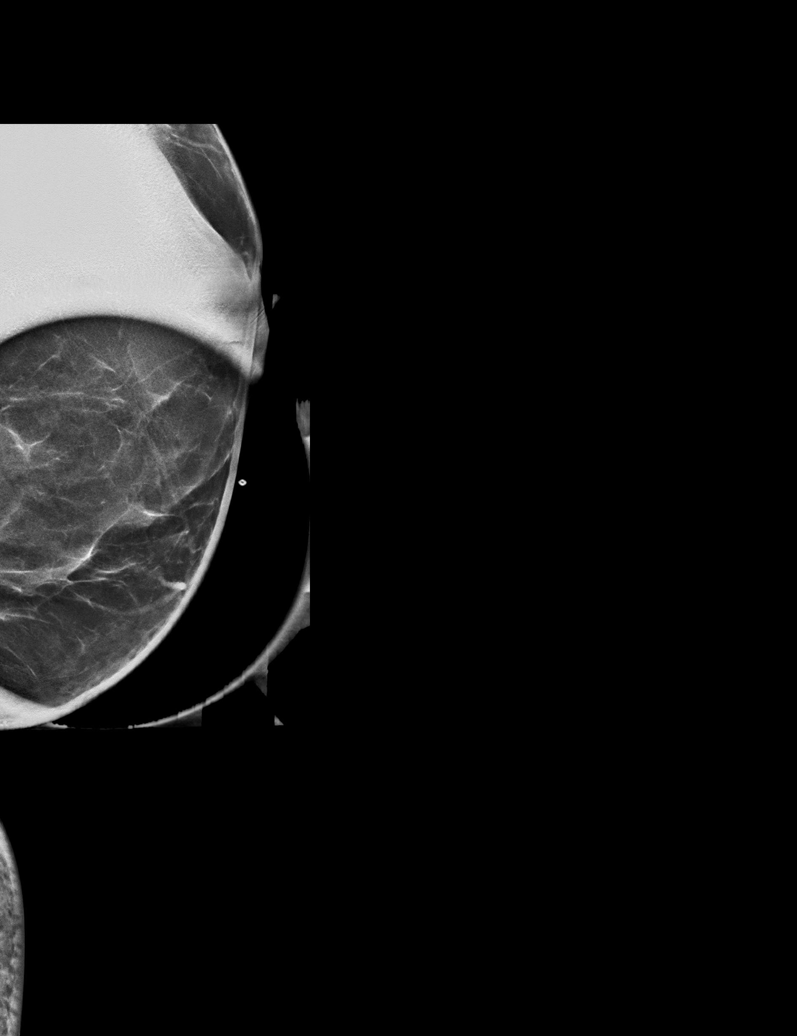

[R MLO synth-2D (2 of 2)]
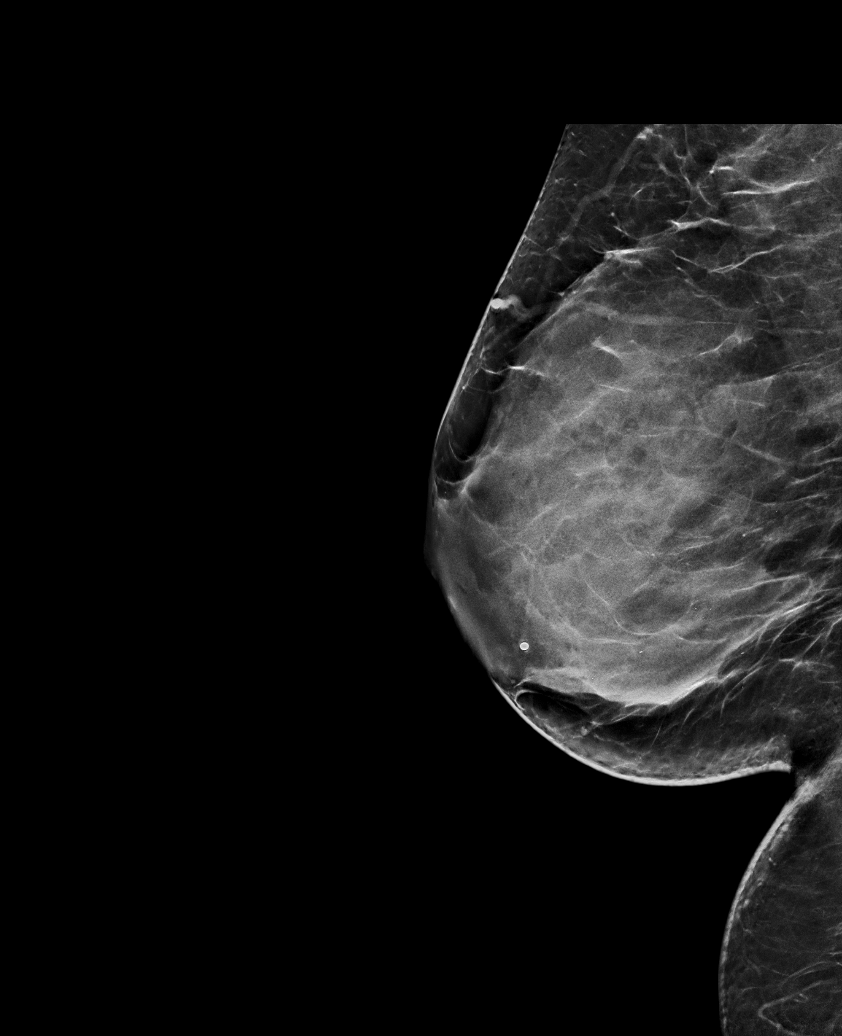

[L CC synth-2D]
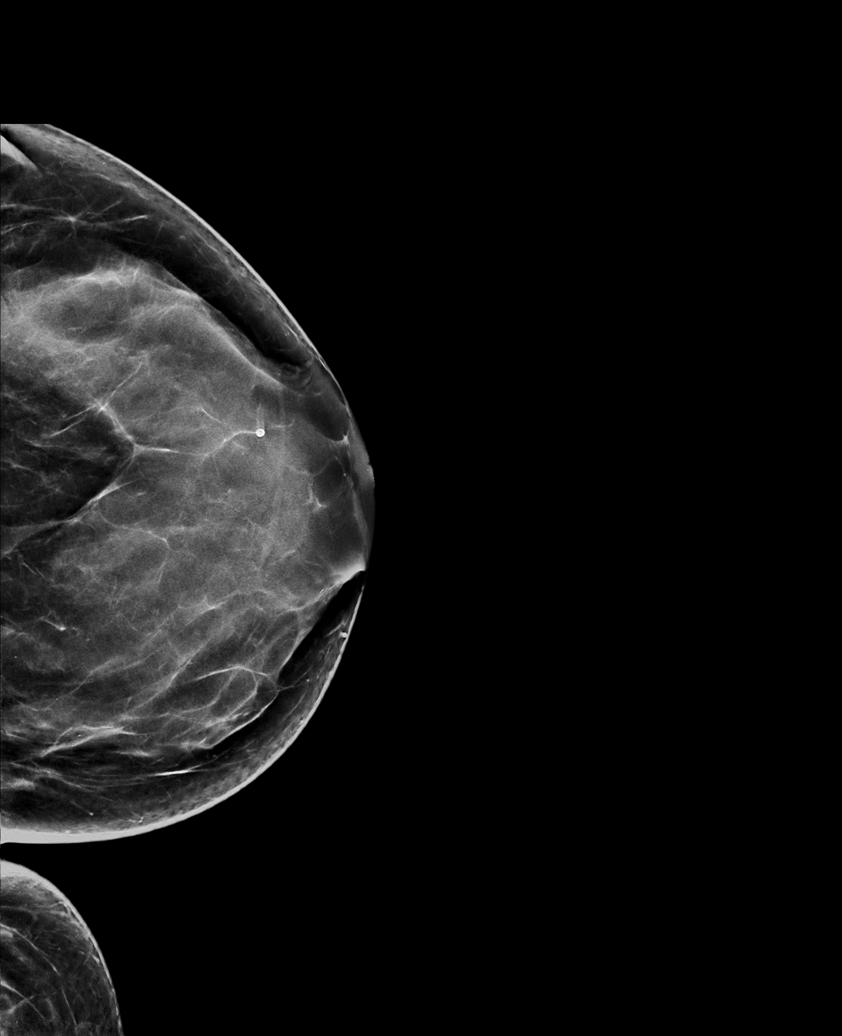

[L MLO synth-2D (2 of 2)]
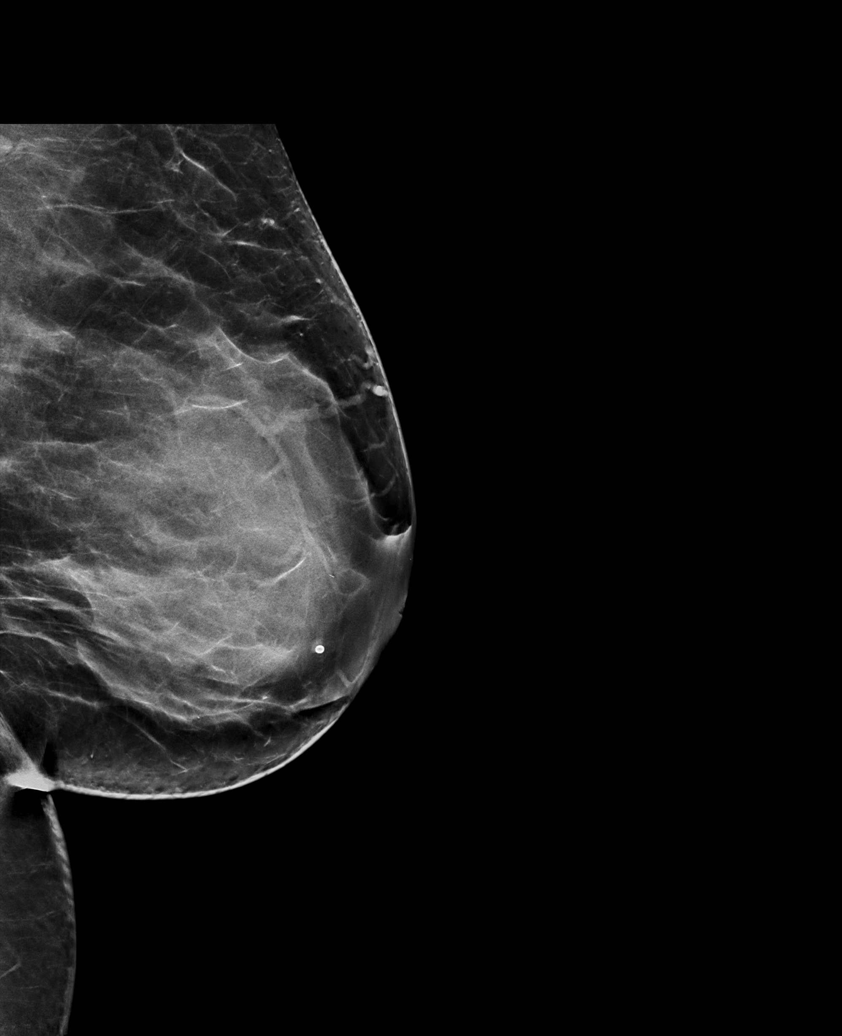

[R CC synth-2D]
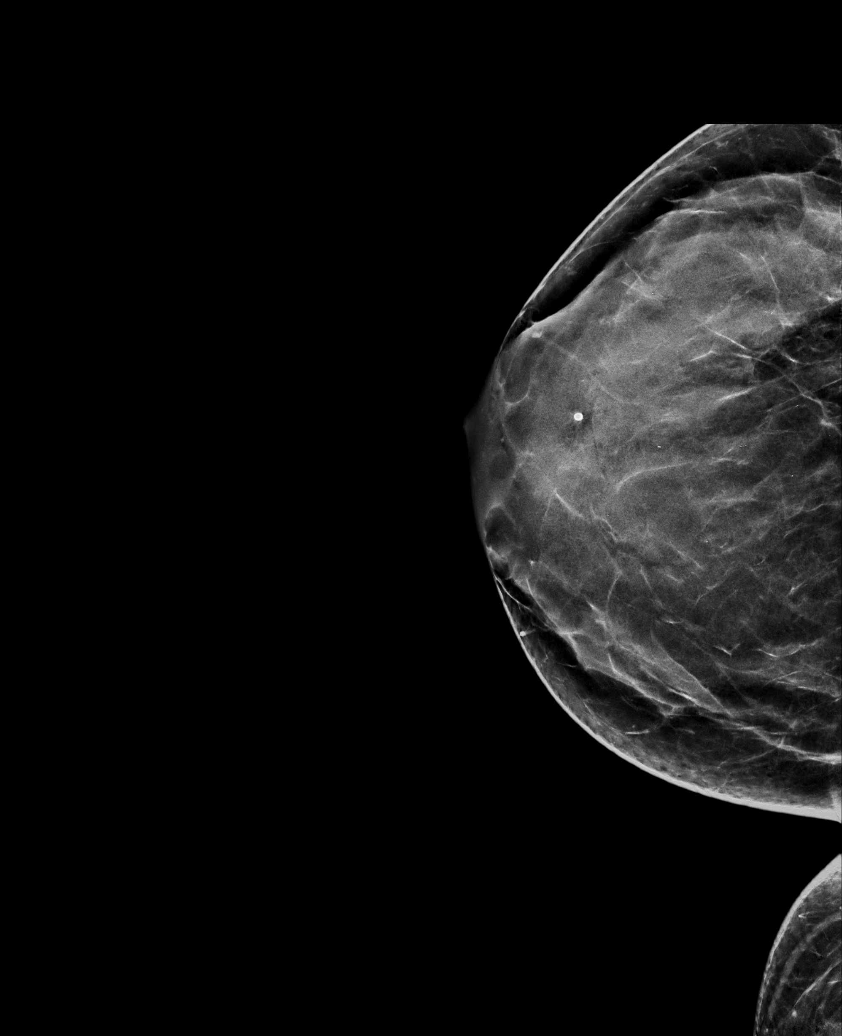

[6 of 36 positions shown; findings below may reference images not displayed]

ACR Breast Density Category d: The breast tissue is extremely dense,
which lowers the sensitivity of mammography.
FINDINGS: No suspicious masses or calcifications are seen in either breast.
Spot compression MLO tomograms were performed over the palpable
areas of concern in each breast with no abnormality seen.

Mammographic images were processed with CAD.

Physical examination at site of palpable concern in the lower
central breast does not reveal any discrete palpable masses.

Targeted ultrasound of the bilateral breasts was performed. No
suspicious masses or abnormality seen, only extremely dense
fibroglandular tissue identified.
IMPRESSION: 1. No abnormalities identified at the sites of palpable concern in
each breast.

2.  No findings of malignancy in either breast.

RECOMMENDATION:
1. Recommend further management of the bilateral breast palpable
abnormalities be based on clinical assessment.

2. Screening mammogram at age 40 unless there are persistent or
intervening clinical concerns. (Code:TL-R-AXX)

I have discussed the findings and recommendations with the patient.
Results were also provided in writing at the conclusion of the
visit. If applicable, a reminder letter will be sent to the patient
regarding the next appointment.

BI-RADS CATEGORY  1: Negative.

## 2019-04-08 IMAGING — US ULTRASOUND RIGHT BREAST LIMITED
1 series · 4 of 4 positions shown · non-contrast
Comparison: None.

CLINICAL DATA: 37-year-old female with bilateral breast palpable
areas of concern.

EXAM:
DIGITAL DIAGNOSTIC BILATERAL MAMMOGRAM WITH CAD AND TOMO
BILATERAL BREAST ULTRASOUND

[Series 1: ultrasound right breast limited · 0.07mm/px · 4 of 4 slices shown]
[im 1/4]
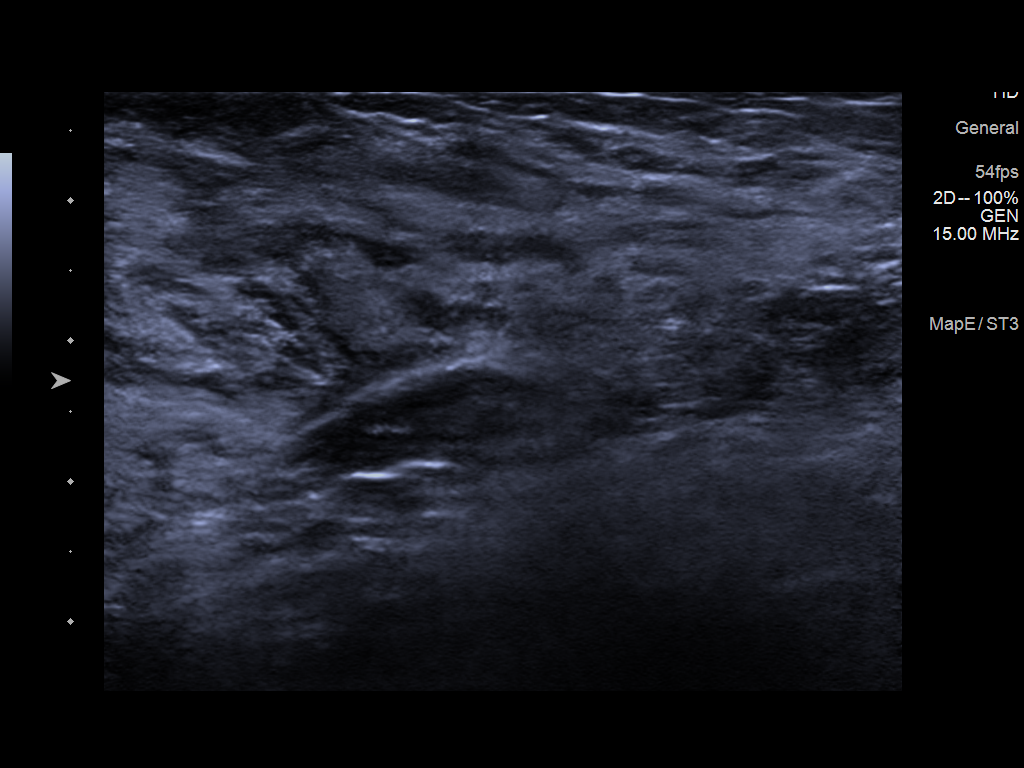
[im 2/4]
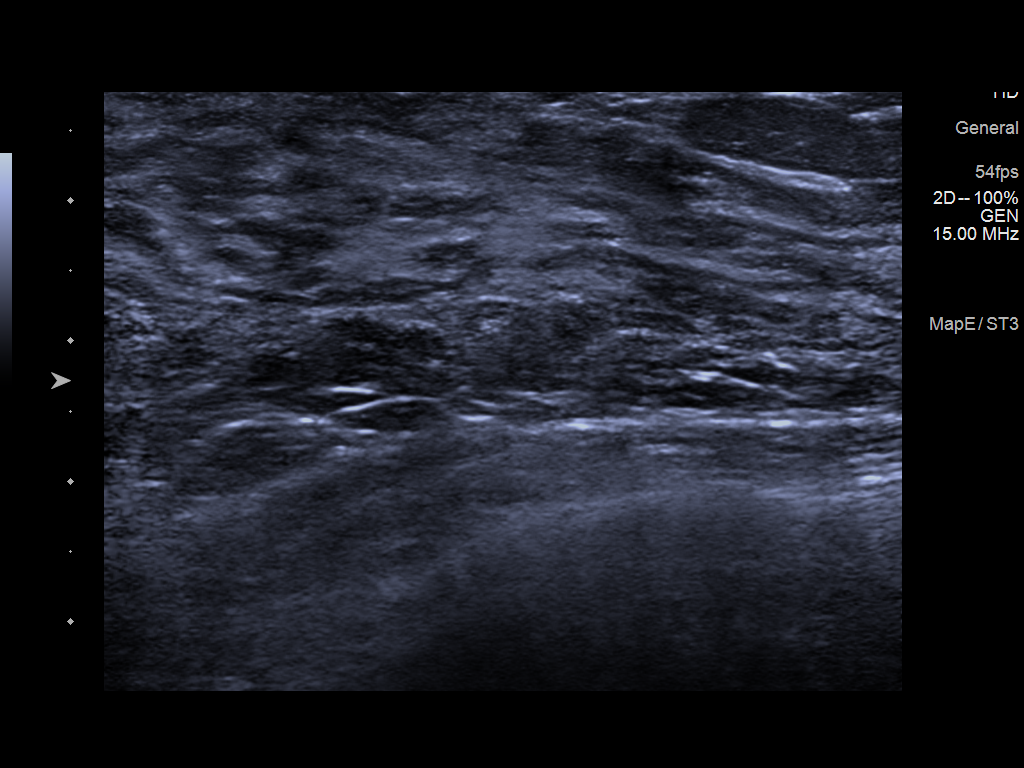
[im 3/4]
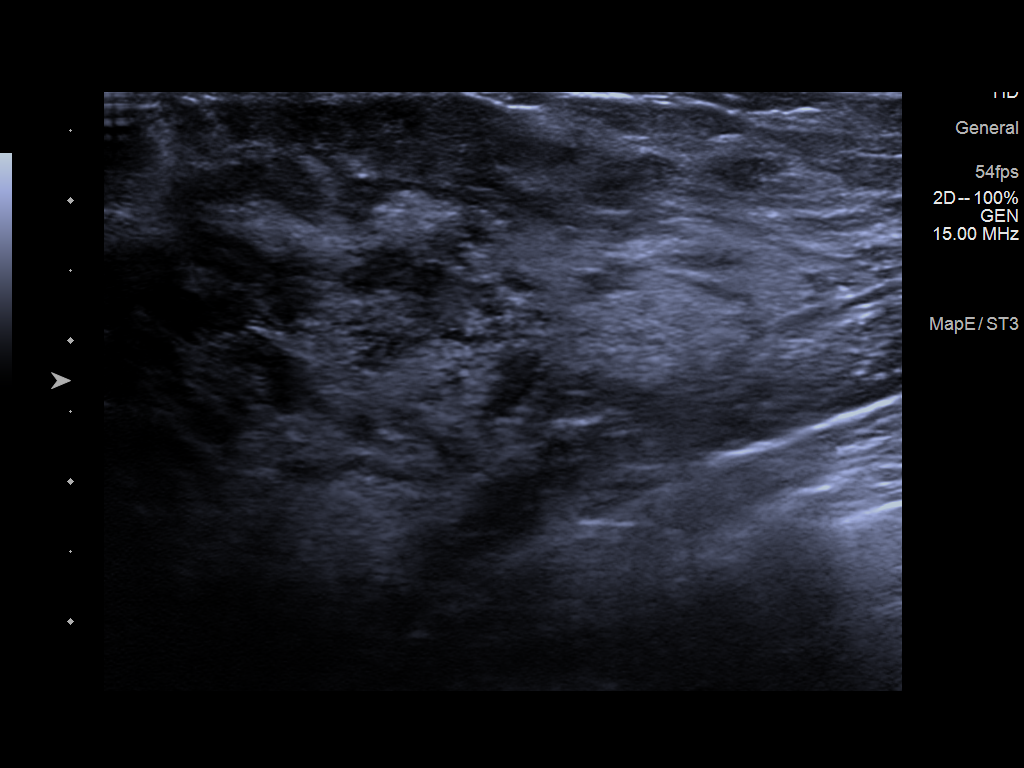
[im 4/4]
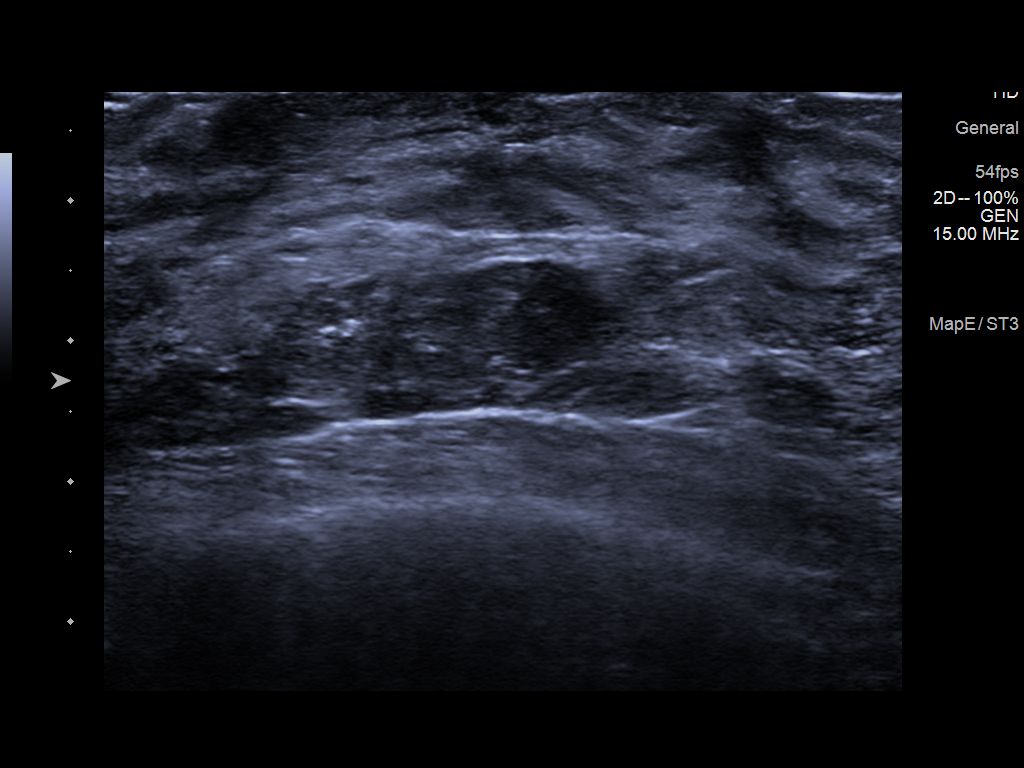

[4 of 4 positions shown; findings below may reference images not displayed]

ACR Breast Density Category d: The breast tissue is extremely dense,
which lowers the sensitivity of mammography.
FINDINGS: No suspicious masses or calcifications are seen in either breast.
Spot compression MLO tomograms were performed over the palpable
areas of concern in each breast with no abnormality seen.

Mammographic images were processed with CAD.

Physical examination at site of palpable concern in the lower
central breast does not reveal any discrete palpable masses.

Targeted ultrasound of the bilateral breasts was performed. No
suspicious masses or abnormality seen, only extremely dense
fibroglandular tissue identified.
IMPRESSION: 1. No abnormalities identified at the sites of palpable concern in
each breast.

2.  No findings of malignancy in either breast.

RECOMMENDATION:
1. Recommend further management of the bilateral breast palpable
abnormalities be based on clinical assessment.

2. Screening mammogram at age 40 unless there are persistent or
intervening clinical concerns. (Code:TL-R-AXX)

I have discussed the findings and recommendations with the patient.
Results were also provided in writing at the conclusion of the
visit. If applicable, a reminder letter will be sent to the patient
regarding the next appointment.

BI-RADS CATEGORY  1: Negative.

## 2019-04-14 ENCOUNTER — Ambulatory Visit: Payer: Medicare Other | Admitting: Family Medicine

## 2019-04-20 ENCOUNTER — Ambulatory Visit (INDEPENDENT_AMBULATORY_CARE_PROVIDER_SITE_OTHER): Payer: Medicare Other | Admitting: Family Medicine

## 2019-04-20 ENCOUNTER — Other Ambulatory Visit: Payer: Self-pay

## 2019-04-20 ENCOUNTER — Encounter: Payer: Self-pay | Admitting: Family Medicine

## 2019-04-20 VITALS — BP 118/78 | HR 81 | Temp 97.5°F | Resp 15 | Ht 63.0 in | Wt 202.0 lb

## 2019-04-20 DIAGNOSIS — Z309 Encounter for contraceptive management, unspecified: Secondary | ICD-10-CM

## 2019-04-20 DIAGNOSIS — Z23 Encounter for immunization: Secondary | ICD-10-CM

## 2019-04-20 DIAGNOSIS — K029 Dental caries, unspecified: Secondary | ICD-10-CM

## 2019-04-20 DIAGNOSIS — G43709 Chronic migraine without aura, not intractable, without status migrainosus: Secondary | ICD-10-CM

## 2019-04-20 DIAGNOSIS — I10 Essential (primary) hypertension: Secondary | ICD-10-CM | POA: Diagnosis not present

## 2019-04-20 DIAGNOSIS — K5900 Constipation, unspecified: Secondary | ICD-10-CM

## 2019-04-20 LAB — POCT URINE PREGNANCY: Preg Test, Ur: NEGATIVE

## 2019-04-20 MED ORDER — MEDROXYPROGESTERONE ACETATE 150 MG/ML IM SUSP
150.0000 mg | Freq: Once | INTRAMUSCULAR | Status: AC
  Administered 2019-04-20: 150 mg via INTRAMUSCULAR

## 2019-04-20 MED ORDER — CEPHALEXIN 500 MG PO CAPS: 500.0000 mg | ORAL_CAPSULE | Freq: Four times a day (QID) | ORAL | 0 refills | Status: DC

## 2019-04-20 MED ORDER — FLUCONAZOLE 150 MG PO TABS: ORAL_TABLET | ORAL | 0 refills | Status: DC

## 2019-04-20 NOTE — Patient Instructions (Addendum)
Annual exam wih MD and pap in 12 weeks, also depo provera at visit  Flu vaccine today  Depo today after negative pregnancy test since late  10 day antibiotic course for teeth an fluconazole as needed    Please commit to 1500 calorie diet, we will give you a sample and continue regular exercise and enjoy it!  Think about what you will eat, plan ahead. Choose " clean, green, fresh or frozen" over canned, processed or packaged foods which are more sugary, salty and fatty. 70 to 75% of food eaten should be vegetables and fruit. Three meals at set times with snacks allowed between meals, but they must be fruit or vegetables. Aim to eat over a 12 hour period , example 7 am to 7 pm, and STOP after  your last meal of the day. Drink water,generally about 64 ounces per day, no other drink is as healthy. Fruit juice is best enjoyed in a healthy way, by EATING the fruit. Thanks for choosing [facility], we consider it a privelige to serve you.

## 2019-04-20 NOTE — Progress Notes (Signed)
Martha Montgomery     MRN: [id number]      DOB: 1980-09-03   HPI Martha Montgomery is here for follow up and re-evaluation of chronic medical conditions, medication management and review of any available recent lab and radiology data.  Preventive health is updated, specifically  Immunization.   Questions or concerns regarding consultations or procedures which the PT has had in the interim are  addressed. The PT denies any adverse reactions to current medications since the last visit.  Recurrent gum swelling and dental pain in past 6 months, has had in braces for over 5 years and feels this may be attributing to the problem,, reports has about 4 cavities that need attention now, waiting on insurance coverage which should be available ion next month.No fever , chills or purulent drainage  Needs Depo and is late exercisisng more regularly and continues to work on diet  ROS Denies recent fever or chills. Denies sinus pressure, nasal congestion, ear pain or sore throat.Increased  Clear sinus drainage, as expected during this season, responding to flonase and benadryl Denies chest congestion, productive cough or wheezing. Denies chest pains, palpitations and leg swelling Denies abdominal pain, nausea, vomiting,diarrhea or constipation.   Denies dysuria, frequency, hesitancy or incontinence. Denies joint pain, swelling and limitation in mobility. Denies headaches, seizures, numbness, or tingling. Denies depression, anxiety or insomnia. Denies skin break down or rash.   PE  BP 118/78   Pulse 81   Temp (!) 97.5 F (36.4 C) (Temporal)   Resp 15   Ht 5\' 3"  (1.6 m)   Wt 202 lb (91.6 kg)   SpO2 98%   BMI 35.78 kg/m   Patient alert and oriented and in no cardiopulmonary distress.  HEENT: No facial asymmetry, EOMI,   oropharynx pink and moist.  Neck supple no JVD, no mass.gum erythematous, caries present  Chest: Clear to auscultation bilaterally.  CVS: S1, S2 no murmurs, no S3.Regular rate.   ABD: Soft non tender.   Ext: No edema  MS: Adequate ROM spine, shoulders, hips and knees.  Skin: Intact, no ulcerations or rash noted.  Psych: Good eye contact, normal affect. Memory intact not anxious or depressed appearing.  CNS: CN 2-12 intact, power,  normal throughout.no focal deficits noted.   Assessment & Plan  Encounter for contraceptive management Urine pregnancy test in office is negative Depo provera 150 mg iM administered  HTN (hypertension), benign Controlled, no change in medication DASH diet and commitment to daily physical activity for a minimum of 30 minutes discussed and encouraged, as a part of hypertension management. The importance of attaining a healthy weight is also discussed.  BP/Weight 04/20/2019 02/08/2019 01/05/2019 10/14/2018 10/05/2018 09/14/2018 [id number]  Systolic BP 123456 [id number]  Diastolic BP 78 79 80 80 89 85 90  Wt. (Lbs) 202 202.4 204 195.08 195 193 195.08  BMI 35.78 35.85 36.14 34.56 34.54 34.19 34.56       Morbid obesity (HCC) Obesity associated with hypertension and arthritis  Patient re-educated about  the importance of commitment to a  minimum of 150 minutes of exercise per week as able.  The importance of healthy food choices with portion control discussed, as well as eating regularly and within a 12 hour window most days. The need to choose "clean , green" food 50 to 75% of the time is discussed, as well as to make water the primary drink and set a goal of 64 ounces water daily.    Weight /  BMI 04/20/2019 02/08/2019 01/05/2019  WEIGHT 202 lb 202 lb 6.4 oz 204 lb  HEIGHT 5\' 3"  5\' 3"  5\' 3"   BMI 35.78 kg/m2 35.85 kg/m2 36.14 kg/m2      Migraine headache Controlled and managed by Neurology  Dental caries Recurrent problem, financially challenged currently however will soon correct with insurance. Antibiotic prescribed  Constipation Good response to [employer]

## 2019-04-24 ENCOUNTER — Encounter: Payer: Self-pay | Admitting: Family Medicine

## 2019-04-24 DIAGNOSIS — K029 Dental caries, unspecified: Secondary | ICD-10-CM | POA: Insufficient documentation

## 2019-04-24 NOTE — Assessment & Plan Note (Signed)
Recurrent problem, financially challenged currently however will soon correct with insurance. Antibiotic prescribed

## 2019-04-24 NOTE — Assessment & Plan Note (Signed)
Controlled, no change in medication DASH diet and commitment to daily physical activity for a minimum of 30 minutes discussed and encouraged, as a part of hypertension management. The importance of attaining a healthy weight is also discussed.  BP/Weight 04/20/2019 02/08/2019 01/05/2019 10/14/2018 10/05/2018 09/14/2018 123456  Systolic BP 123456 [id number] [id number]  Diastolic BP 78 79 80 80 89 85 90  Wt. (Lbs) 202 202.4 204 195.08 195 193 195.08  BMI 35.78 35.85 36.14 34.56 34.54 34.19 34.56

## 2019-04-24 NOTE — Assessment & Plan Note (Signed)
Good response to [employer]

## 2019-04-24 NOTE — Assessment & Plan Note (Signed)
Obesity associated with hypertension and arthritis  Patient re-educated about  the importance of commitment to a  minimum of 150 minutes of exercise per week as able.  The importance of healthy food choices with portion control discussed, as well as eating regularly and within a 12 hour window most days. The need to choose "clean , green" food 50 to 75% of the time is discussed, as well as to make water the primary drink and set a goal of 64 ounces water daily.    Weight /BMI 04/20/2019 02/08/2019 01/05/2019  WEIGHT 202 lb 202 lb 6.4 oz 204 lb  HEIGHT 5\' 3"  5\' 3"  5\' 3"   BMI 35.78 kg/m2 35.85 kg/m2 36.14 kg/m2

## 2019-04-24 NOTE — Assessment & Plan Note (Signed)
Urine pregnancy test in office is negative Depo provera 150 mg iM administered

## 2019-04-24 NOTE — Assessment & Plan Note (Signed)
Controlled and managed by Neurology 

## 2019-06-21 DIAGNOSIS — M545 Low back pain: Secondary | ICD-10-CM | POA: Diagnosis not present

## 2019-06-21 DIAGNOSIS — Z79899 Other long term (current) drug therapy: Secondary | ICD-10-CM | POA: Diagnosis not present

## 2019-06-21 DIAGNOSIS — M130 Polyarthritis, unspecified: Secondary | ICD-10-CM | POA: Diagnosis not present

## 2019-06-21 DIAGNOSIS — M25569 Pain in unspecified knee: Secondary | ICD-10-CM | POA: Diagnosis not present

## 2019-07-13 ENCOUNTER — Encounter: Payer: Medicare Other | Admitting: Family Medicine

## 2019-07-19 ENCOUNTER — Other Ambulatory Visit: Payer: Self-pay

## 2019-07-19 ENCOUNTER — Ambulatory Visit (INDEPENDENT_AMBULATORY_CARE_PROVIDER_SITE_OTHER): Payer: Medicare Other

## 2019-07-19 DIAGNOSIS — Z308 Encounter for other contraceptive management: Secondary | ICD-10-CM | POA: Diagnosis not present

## 2019-07-19 MED ORDER — MEDROXYPROGESTERONE ACETATE 150 MG/ML IM SUSP
150.0000 mg | Freq: Once | INTRAMUSCULAR | Status: AC
  Administered 2019-07-19: 15:00:00 150 mg via INTRAMUSCULAR

## 2019-07-29 HISTORY — PX: KNEE ARTHROSCOPY: SUR90

## 2019-08-03 ENCOUNTER — Ambulatory Visit (INDEPENDENT_AMBULATORY_CARE_PROVIDER_SITE_OTHER): Payer: Medicare Other | Admitting: Family Medicine

## 2019-08-03 ENCOUNTER — Other Ambulatory Visit (HOSPITAL_COMMUNITY)
Admission: RE | Admit: 2019-08-03 | Discharge: 2019-08-03 | Disposition: A | Discharge status: Home / Self Care | Payer: Medicare Other | Source: Ambulatory Visit | Attending: Family Medicine | Admitting: Family Medicine

## 2019-08-03 ENCOUNTER — Other Ambulatory Visit: Payer: Self-pay

## 2019-08-03 ENCOUNTER — Other Ambulatory Visit: Payer: Self-pay | Admitting: Nurse Practitioner

## 2019-08-03 VITALS — BP 140/98 | HR 89 | Temp 97.8°F | Resp 15 | Ht 63.0 in | Wt 208.1 lb

## 2019-08-03 DIAGNOSIS — K649 Unspecified hemorrhoids: Secondary | ICD-10-CM

## 2019-08-03 DIAGNOSIS — Z1151 Encounter for screening for human papillomavirus (HPV): Secondary | ICD-10-CM | POA: Diagnosis not present

## 2019-08-03 DIAGNOSIS — Z124 Encounter for screening for malignant neoplasm of cervix: Secondary | ICD-10-CM | POA: Insufficient documentation

## 2019-08-03 DIAGNOSIS — N760 Acute vaginitis: Secondary | ICD-10-CM

## 2019-08-03 DIAGNOSIS — E890 Postprocedural hypothyroidism: Secondary | ICD-10-CM

## 2019-08-03 DIAGNOSIS — I10 Essential (primary) hypertension: Secondary | ICD-10-CM

## 2019-08-03 DIAGNOSIS — Z0001 Encounter for general adult medical examination with abnormal findings: Secondary | ICD-10-CM

## 2019-08-03 DIAGNOSIS — R7989 Other specified abnormal findings of blood chemistry: Secondary | ICD-10-CM

## 2019-08-03 DIAGNOSIS — K625 Hemorrhage of anus and rectum: Secondary | ICD-10-CM

## 2019-08-03 DIAGNOSIS — Z125 Encounter for screening for malignant neoplasm of prostate: Secondary | ICD-10-CM

## 2019-08-03 DIAGNOSIS — Z0000 Encounter for general adult medical examination without abnormal findings: Secondary | ICD-10-CM

## 2019-08-03 DIAGNOSIS — K5900 Constipation, unspecified: Secondary | ICD-10-CM

## 2019-08-03 DIAGNOSIS — R7301 Impaired fasting glucose: Secondary | ICD-10-CM

## 2019-08-03 MED ORDER — AMLODIPINE BESYLATE 5 MG PO TABS: 5.0000 mg | ORAL_TABLET | Freq: Every day | ORAL | 3 refills | Status: DC

## 2019-08-03 NOTE — Patient Instructions (Addendum)
F/u nin office , re evaluate blood pressure with mD in 5 weeks, call if you need me sooner  Start higher dose of amlodipine 5 mg one daily, this is sent to your pharmacy, continue hCTZ as before  Lbas today cBC, lipid, cmp and EGFR, tSH , vit D, free T4 AND  Hba1C  INFORMATION PROVIDED RE BARIATRIC ( WEIGHT LOSS SURGERY) YOU DO QUALIFY ON MEDICAL GROUNDS  It is important that you exercise regularly at least 30 minutes 5 times a week. If you develop chest pain, have severe difficulty breathing, or feel very tired, stop exercising immediately and seek medical attention  negative MIND Thanks for choosing [facility], we consider it a privelige to serve you.   Best FOR 2021!

## 2019-08-05 ENCOUNTER — Encounter: Payer: Self-pay | Admitting: Family Medicine

## 2019-08-05 LAB — CERVICOVAGINAL ANCILLARY ONLY
Bacterial Vaginitis (gardnerella): NEGATIVE
Candida Glabrata: NEGATIVE
Candida Vaginitis: NEGATIVE
Chlamydia: NEGATIVE
Comment: NEGATIVE
Comment: NEGATIVE
Comment: NEGATIVE
Comment: NEGATIVE
Comment: NEGATIVE
Comment: NORMAL
Neisseria Gonorrhea: NEGATIVE
Trichomonas: NEGATIVE

## 2019-08-05 LAB — CYTOLOGY - PAP
Comment: NEGATIVE
Diagnosis: NEGATIVE
High risk HPV: NEGATIVE

## 2019-08-05 NOTE — Assessment & Plan Note (Signed)
Uncontrolled, increase amlodipine dose to 5 mg daily DASH diet and commitment to daily physical activity for a minimum of 30 minutes discussed and encouraged, as a part of hypertension management. The importance of attaining a healthy weight is also discussed.  BP/Weight 08/03/2019 04/20/2019 02/08/2019 01/05/2019 10/14/2018 10/05/2018 [id number]  Systolic BP [id number] 123456 [id number]  Diastolic BP 98 78 79 80 80 89 85  Wt. (Lbs) 208.12 202 202.4 204 195.08 195 193  BMI 36.87 35.78 35.85 36.14 34.56 34.54 34.19     Review I in 5 weeks

## 2019-08-05 NOTE — Progress Notes (Signed)
Martha Montgomery     MRN: [id number]      DOB: 01-19-1981  HPI: Patient is in for annual physical exam. Uncontrolled hyperextension is also addressed Requests STD testing  Immunization is reviewed and is up to date   PE: BP (!) 140/98 Comment: just took meds  Pulse 89   Temp 97.8 F (36.6 C) (Temporal)   Resp 15   Ht 5\' 3"  (1.6 m)   Wt 208 lb 1.9 oz (94.4 kg)   SpO2 98%   BMI 36.87 kg/m   Pleasant  female, alert and oriented x 3, in no cardio-pulmonary distress. Afebrile. HEENT No facial trauma or asymetry. Sinuses non tender.  Extra occullar muscles intact.. External ears normal, . Neck: supple, no adenopathy,JVD or thyromegaly.No bruits.  Chest: Clear to ascultation bilaterally.No crackles or wheezes. Non tender to palpation  Breast: No asymetry,no masses or lumps. No tenderness. No nipple discharge or inversion. No axillary or supraclavicular adenopathy  Cardiovascular system; Heart sounds normal,  S1 and  S2 ,no S3.  No murmur, or thrill. Apical beat not displaced Peripheral pulses normal.  Abdomen: Soft, non tender, no organomegaly or masses. No bruits. Bowel sounds normal. No guarding, tenderness or rebound.   GU: External genitalia normal female genitalia , normal female distribution of hair. No lesions. Urethral meatus normal in size, no  Prolapse, no lesions visibly  Present. Bladder non tender. Vagina pink and moist , with no visible lesions , discharge present . Adequate pelvic support no  cystocele or rectocele noted Cervix pink and appears healthy, no lesions or ulcerations noted, no discharge noted from os Uterus normal size, no adnexal masses, no cervical motion or adnexal tenderness.   Musculoskeletal exam: Full ROM of spine, hips , shoulders and knees. No deformity ,swelling or crepitus noted. No muscle wasting or atrophy.   Neurologic: Cranial nerves 2 to 12 intact. Power, tone ,sensation and reflexes normal throughout. No  disturbance in gait. No tremor.  Skin: Intact, no ulceration, erythema , scaling or rash noted. Pigmentation normal throughout  Psych; Normal mood and affect. Judgement and concentration normal   Assessment & Plan:  HTN (hypertension), benign Uncontrolled, increase amlodipine dose to 5 mg daily DASH diet and commitment to daily physical activity for a minimum of 30 minutes discussed and encouraged, as a part of hypertension management. The importance of attaining a healthy weight is also discussed.  BP/Weight 08/03/2019 04/20/2019 02/08/2019 01/05/2019 10/14/2018 10/05/2018 [id number]  Systolic BP [id number]  Diastolic BP 98 78 79 80 80 89 85  Wt. (Lbs) 208.12 202 202.4 204 195.08 195 193  BMI 36.87 35.78 35.85 36.14 34.56 34.54 34.19     Review I in 5 weeks  Morbid obesity (HCC)  Patient re-educated about  the importance of commitment to a  minimum of 150 minutes of exercise per week as able.  The importance of healthy food choices with portion control discussed, as well as eating regularly and within a 12 hour window most days. The need to choose "clean , green" food 50 to 75% of the time is discussed, as well as to make water the primary drink and set a goal of 64 ounces water daily.    Weight /BMI 08/03/2019 04/20/2019 02/08/2019  WEIGHT 208 lb 1.9 oz 202 lb 202 lb 6.4 oz  HEIGHT 5\' 3"  5\' 3"  5\' 3"   BMI 36.87 kg/m2 35.78 kg/m2 35.85 kg/m2   Obesity associated with hypertension and arthritis. Information provided re  bariatric surgery   Annual physical exam Annual exam as documented. Counseling done  re healthy lifestyle involving commitment to 150 minutes exercise per week, heart healthy diet, and attaining healthy weight.Tdiscussed. Changes in health habits are decided on by the patient with goals and time frames  set for achieving them. Immunization and cancer screening needs are specifically addressed at this visit.

## 2019-08-05 NOTE — Assessment & Plan Note (Signed)
  Patient re-educated about  the importance of commitment to a  minimum of 150 minutes of exercise per week as able.  The importance of healthy food choices with portion control discussed, as well as eating regularly and within a 12 hour window most days. The need to choose "clean , green" food 50 to 75% of the time is discussed, as well as to make water the primary drink and set a goal of 64 ounces water daily.    Weight /BMI 08/03/2019 04/20/2019 02/08/2019  WEIGHT 208 lb 1.9 oz 202 lb 202 lb 6.4 oz  HEIGHT 5\' 3"  5\' 3"  5\' 3"   BMI 36.87 kg/m2 35.78 kg/m2 35.85 kg/m2   Obesity associated with hypertension and arthritis. Information provided re bariatric surgery

## 2019-08-05 NOTE — Assessment & Plan Note (Signed)
Annual exam as documented. Counseling done  re healthy lifestyle involving commitment to 150 minutes exercise per week, heart healthy diet, and attaining healthy weight.Tdiscussed. Changes in health habits are decided on by the patient with goals and time frames  set for achieving them. Immunization and cancer screening needs are specifically addressed at this visit.

## 2019-08-15 ENCOUNTER — Ambulatory Visit: Payer: Self-pay

## 2019-08-16 ENCOUNTER — Ambulatory Visit: Payer: Medicare Other | Admitting: Nurse Practitioner

## 2019-08-16 ENCOUNTER — Encounter: Payer: Self-pay | Admitting: Nurse Practitioner

## 2019-08-16 ENCOUNTER — Other Ambulatory Visit: Payer: Self-pay

## 2019-08-16 VITALS — BP 145/95 | HR 97 | Temp 96.2°F | Ht 63.0 in | Wt 205.2 lb

## 2019-08-16 DIAGNOSIS — K581 Irritable bowel syndrome with constipation: Secondary | ICD-10-CM | POA: Diagnosis not present

## 2019-08-16 DIAGNOSIS — K625 Hemorrhage of anus and rectum: Secondary | ICD-10-CM

## 2019-08-16 DIAGNOSIS — K219 Gastro-esophageal reflux disease without esophagitis: Secondary | ICD-10-CM

## 2019-08-16 MED ORDER — PANTOPRAZOLE SODIUM 40 MG PO TBEC: 40.0000 mg | DELAYED_RELEASE_TABLET | Freq: Two times a day (BID) | ORAL | 3 refills | Status: DC

## 2019-08-16 NOTE — Assessment & Plan Note (Signed)
GERD doing okay on Protonix once daily, but feels she has room for improvement.  Some more frequent breakthrough symptoms.  I will increase her Protonix to twice daily.  Return for follow-up in 6 months.

## 2019-08-16 NOTE — Assessment & Plan Note (Signed)
1 episode of mostly scant toilet tissue hematochezia.  She does have ongoing constipation but does not remember overtly straining at that time.  Reviewed procedures and last colonoscopy about 1 and half to 2 years ago with similar presentation of rectal bleeding found to be due to internal hemorrhoids.  I have told her to call us if she needs hemorrhoid cream or suppositories to help with any persistent bleeding.  Otherwise, rare bleeding is not overtly concerning with known hemorrhoids.  She will likely start colon cancer screening in about 8 years assuming no other symptoms.

## 2019-08-16 NOTE — Progress Notes (Signed)
Cc'ed to pcp °

## 2019-08-16 NOTE — Progress Notes (Signed)
Referring Provider: Fayrene Helper, MD Primary Care Physician:  Fayrene Helper, MD Primary GI:  Dr. Oneida Alar  Chief Complaint  Patient presents with  . Constipation  . Gastroesophageal Reflux    HPI:   Martha Montgomery is a 39 y.o. female who presents for follow-up on GERD and IBS.  The patient was last seen in our office 02/08/2019 for the same.  Noted chronic history of IBS constipation type and GERD.  Colonoscopy up-to-date 2019 was essentially normal and recommended 10-year repeat in 2029.  History of hemorrhoids that improved with Anusol.  At her last visit she noted no diarrhea with twice daily dosing of Amitiza.  She is drinking more water and having better bowel movements.  Typically once a day with more complete emptying.  Occasionally will skip a day but will not take anything at that time.  Also on fiber supplement.  Abdominal pain significantly improved with improvement in GERD and IBS symptoms.  No other overt GI complaints.  Requested refill of Protonix which was sent to her pharmacy.  Recommended continue Protonix, continue Amitiza twice daily and MiraLAX as needed for breakthrough constipation.  Follow-up in 6 months.  Today she states she doing ok overall. Taking fiber 2-3 times a day but still with some constipation. She ran out of Amitiza and has been out for a couple months, a refill is now waiting for her at the Pharmacy. Having a bowel movement every day to every other day but noted incomplete emptying. Stools are incomplete, hard, straining. Still with some GERD symptoms on Protonix once daily. She is planning diet changes to help with weight loss and GI symptoms. Denies abdominal pain, N/V, melena, fever, chills, unintentional weight loss. She had one occasion of scant toilet tissue hematochezia, wasn't constipated or straining at that time. Does have rare hemorrhoid symptoms. Sits on the commode up to 15 minutes. Typically closer to 10 minutes. Denies URI or  flu-like symptoms. Denies loss of sense of taste or smell. Denies chest pain, dyspnea, dizziness, lightheadedness, syncope, near syncope. Denies any other upper or lower GI symptoms.  She is wanting her GB to be tested. Denies pain, N/V. Some of her current GI symptoms coincide with other family members' issues when they had billiary problems.  She also notes "feels like something is blocking the stool from coming out" and "like something is irritated and inflamed." Admits rare but occasional flat, ribbon-like stools. Also occasional ractal pain.  Past Medical History:  Diagnosis Date  . Anxiety   . Anxiety disorder   . Arthritis    "knees, joints, hands, toes" (09/28/2013)  . Depression   . Depression, major, single episode, in partial remission ([place]) 10/18/2008   Qualifier: Diagnosis of  By: Moshe Cipro MD, Margaret  PHQ 9 score of 7 in 10/2017, not suicidal or homicidal, interested in telepsych however, score of 16 in  02/2018, not suicidal or homicidal, start medication and refer to telepsych Score of 7 in 07/2018   . Fibromyalgia   . Gastritis nov. 2011   EGD by Dr. Oneida Alar, negative h pylori  . GERD (gastroesophageal reflux disease)   . Gout   . Hypertension x 6 years   . Hypothyroidism   . Migraines    "15/month average" (09/28/2013)  . Spinal stenosis   . Thyroid goiter     Past Surgical History:  Procedure Laterality Date  . BIOPSY  10/20/2017   Procedure: BIOPSY;  Surgeon: Danie Binder, MD;  Location: [facility];  Service: Endoscopy;;  gastric duodenal esophagus  . CESAREAN SECTION  2008  . CHONDROPLASTY  04/02/2012   Procedure: CHONDROPLASTY;  Surgeon: Carole Civil, MD;  Location: [facility];  Service: Orthopedics;  Laterality: Left;  of left patella  . COLONOSCOPY  May 2012   Dr. Oneida Alar: normal colon, small internal hemorrhoids  . COLONOSCOPY WITH PROPOFOL N/A 10/22/2017   Procedure: COLONOSCOPY WITH PROPOFOL;  Surgeon: Danie Binder, MD;  Location: [facility];   Service: Endoscopy;  Laterality: N/A;  7:30am  . COLONOSCOPY WITH PROPOFOL N/A 10/20/2017   Procedure: COLONOSCOPY WITH PROPOFOL;  Surgeon: Danie Binder, MD;  Location: [facility];  Service: Endoscopy;  Laterality: N/A;  11:00am  . ESOPHAGOGASTRODUODENOSCOPY (EGD) WITH PROPOFOL N/A 10/20/2017   Procedure: ESOPHAGOGASTRODUODENOSCOPY (EGD) WITH PROPOFOL;  Surgeon: Danie Binder, MD;  Location: [facility];  Service: Endoscopy;  Laterality: N/A;  . INCISION AND DRAINAGE ABSCESS  1997; 2005   "under right buttocks; under left arm"  . SAVORY DILATION N/A 10/20/2017   Procedure: SAVORY DILATION;  Surgeon: Danie Binder, MD;  Location: [facility];  Service: Endoscopy;  Laterality: N/A;  . THYROIDECTOMY N/A 09/28/2013   Procedure: TOTAL THYROIDECTOMY;  Surgeon: Ascencion Dike, MD;  Location: [facility];  Service: ENT;  Laterality: N/A;  . TOTAL THYROIDECTOMY  09/27/2013  . UPPER GI ENDOSCOPY  2011   EGD: gastritis, no h.pylori    Current Outpatient Medications  Medication Sig Dispense Refill  . AMITIZA 24 MCG capsule TAKE 1 CAPSULE BY MOUTH TWICE DAILY WITH A MEAL 180 capsule 3  . amLODipine (NORVASC) 5 MG tablet Take 1 tablet (5 mg total) by mouth daily. 90 tablet 3  . Calcium Polycarbophil (FIBER-CAPS PO) Take 2 capsules by mouth 3 (three) times daily.    . fluticasone (FLONASE) 50 MCG/ACT nasal spray Place 1 spray into both nostrils daily as needed for allergies or rhinitis.    . hydrochlorothiazide (HYDRODIURIL) 25 MG tablet Take 25 mg by mouth daily. Once daily     . levothyroxine (SYNTHROID, LEVOTHROID) 200 MCG tablet TAKE 1 TABLET BY MOUTH EVERY DAY BEFORE BREAKFAST 90 tablet 2  . medroxyPROGESTERone (DEPO-PROVERA) 150 MG/ML injection Inject 150 mg into the muscle every 3 (three) months.    . metoprolol tartrate (LOPRESSOR) 50 MG tablet TAKE 1 TABLET BY MOUTH TWICE DAILY 180 tablet 1  . pantoprazole (PROTONIX) 40 MG tablet Take 1 tablet (40 mg total) by mouth daily. Take 30 minutes before  breakfast 90 tablet 3  . potassium chloride (K-DUR) 10 MEQ tablet Take 1 tablet (10 mEq total) by mouth daily. 30 tablet 3  . pregabalin (LYRICA) 100 MG capsule Take 100 mg by mouth 3 (three) times daily.    . traMADol (ULTRAM) 50 MG tablet Take by mouth every 12 (twelve) hours as needed.     No current facility-administered medications for this visit.    Allergies as of 08/16/2019 - Review Complete 08/16/2019  Allergen Reaction Noted  . Codeine Itching   . Ibuprofen Other (See Comments) 09/02/2016  . Soybean-containing drug products Other (See Comments) 12/10/2010    Family History  Problem Relation Age of Onset  . Arthritis Mother   . Migraines Mother   . Hypertension Mother   . Cancer Maternal Grandmother        lung  . Asthma Other        family history   . Colon cancer Neg Hx     Social  History   Socioeconomic History  . Marital status: Legally Separated    Spouse name: Not on file  . Number of children: 1  . Years of education: 54  . Highest education level: 12th grade  Occupational History  . Occupation: Hospital doctor: UNEMPLOYED  Tobacco Use  . Smoking status: Former Smoker    Packs/day: 1.00    Years: 5.00    Pack years: 5.00    Types: E-cigarettes    Quit date: 09/11/2010    Years since quitting: 8.9  . Smokeless tobacco: Never Used  . Tobacco comment: QUIT SMOKING X 3 YEARS AGO,Vaping now  Substance and Sexual Activity  . Alcohol use: No  . Drug use: No  . Sexual activity: Not Currently    Partners: Male    Birth control/protection: Other-see comments, Injection    Comment: Depo  Other Topics Concern  . Not on file  Social History Narrative   Pt is currently on medicaid. From [place] and came to [facility] in mid 2010   Is separated from Husband, lives with Mother right now    Social Determinants of Health   Financial Resource Strain:   . Difficulty of Paying Living Expenses: Not on file  Food  Insecurity:   . Worried About Charity fundraiser in the Last Year: Not on file  . Ran Out of Food in the Last Year: Not on file  Transportation Needs:   . Lack of Transportation (Medical): Not on file  . Lack of Transportation (Non-Medical): Not on file  Physical Activity:   . Days of Exercise per Week: Not on file  . Minutes of Exercise per Session: Not on file  Stress:   . Feeling of Stress : Not on file  Social Connections:   . Frequency of Communication with Friends and Family: Not on file  . Frequency of Social Gatherings with Friends and Family: Not on file  . Attends Religious Services: Not on file  . Active Member of Clubs or Organizations: Not on file  . Attends Archivist Meetings: Not on file  . Marital Status: Not on file    Review of Systems: General: Negative for anorexia, weight loss, fever, chills, fatigue, weakness. ENT: Negative for hoarseness, difficulty swallowing. CV: Negative for chest pain, angina, palpitations, peripheral edema.  Respiratory: Negative for dyspnea at rest, cough, sputum, wheezing.  GI: See history of present illness. Endo: Negative for unusual weight change.  Heme: Negative for bruising or bleeding. Allergy: Negative for rash or hives.   Physical Exam: BP (!) 145/95   Pulse 97   Temp (!) 96.2 F (35.7 C) (Temporal)   Ht 5\' 3"  (1.6 m)   Wt 205 lb 3.2 oz (93.1 kg)   BMI 36.35 kg/m  General:   Alert and oriented. Pleasant and cooperative. Well-nourished and well-developed.  Eyes:  Without icterus, sclera clear and conjunctiva pink.  Ears:  Normal auditory acuity. Cardiovascular:  S1, S2 present without murmurs appreciated. Extremities without clubbing or edema. Respiratory:  Clear to auscultation bilaterally. No wheezes, rales, or rhonchi. No distress.  Gastrointestinal:  +BS, soft, non-tender and non-distended. No HSM noted. No guarding or rebound. No masses appreciated.  Rectal:  Deferred  Musculoskalatal:  Symmetrical  without gross deformities. Neurologic:  Alert and oriented x4;  grossly normal neurologically. Psych:  Alert and cooperative. Normal mood and affect. Heme/Lymph/Immune: No excessive bruising noted.    08/16/2019 2:02 PM   Disclaimer: This  note was dictated with voice recognition software. Similar sounding words can inadvertently be transcribed and may not be corrected upon review.

## 2019-08-16 NOTE — Assessment & Plan Note (Signed)
IBS constipation type generally well controlled on fiber supplement and Amitiza.  Unfortunately, she has been out of Amitiza for couple months.  She has a prescription waiting for her at the pharmacy and she is going to pick this up and start taking it again.  I expect, based on her response Amitiza previously, that her constipation will improve when she restarts her medication.  Follow-up in 6 months.

## 2019-08-16 NOTE — Patient Instructions (Signed)
Your health issues we discussed today were:   GERD (reflux/heartburn): 1. I sent a prescription to your pharmacy for Protonix.  Increase this to 40 mg twice a day: First thing in the morning and 30 minutes for your last meal the day 2. Call let us know if you have any worsening or severe symptoms or if you do not have improvement on Protonix twice daily  Irritable bowel syndrome constipation type: 1. Pick up and restart Amitiza as soon as you can 2. Continue with fibers foods like vegetables, fiber supplements, increase water intake 3. Call us if you have any worsening or severe symptoms 4. Call us if you have any persistent ongoing bleeding  Overall I recommend:  1. Continue your other current medications 2. Return for follow-up in 6 months 3. Call us if you have any questions or concerns.   ---------------------------------------------------------------  COVID-19 Vaccine Information can be found at: [email or web address] For questions related to vaccine distribution or appointments, please email [email or web address] or call [phone].   ---------------------------------------------------------------   At [facility] we value your feedback. You may receive a survey about your visit today. Please share your experience as we strive to create trusting relationships with our patients to provide genuine, compassionate, quality care.  We appreciate your understanding and patience as we review any laboratory studies, imaging, and other diagnostic tests that are ordered as we care for you. Our office policy is 5 business days for review of these results, and any emergent or urgent results are addressed in a timely manner for your best interest. If you do not hear from our office in 1 week, please contact us.   We also encourage the use of MyChart, which contains your medical information for your review as well. If you  are not enrolled in this feature, an access code is on this after visit summary for your convenience. Thank you for allowing [place] to be involved in your care.  It was great to see you today!  I hope you have a Happy New Year!!

## 2019-08-17 ENCOUNTER — Encounter: Payer: Medicare Other | Admitting: Family Medicine

## 2019-08-26 ENCOUNTER — Encounter: Payer: Medicare Other | Admitting: Family Medicine

## 2019-08-30 ENCOUNTER — Encounter: Payer: Self-pay | Admitting: Family Medicine

## 2019-08-30 ENCOUNTER — Ambulatory Visit (INDEPENDENT_AMBULATORY_CARE_PROVIDER_SITE_OTHER): Payer: Medicare Other | Admitting: Family Medicine

## 2019-08-30 ENCOUNTER — Other Ambulatory Visit: Payer: Self-pay

## 2019-08-30 VITALS — BP 125/95 | HR 79 | Resp 15 | Ht 63.0 in | Wt 208.0 lb

## 2019-08-30 DIAGNOSIS — Z0000 Encounter for general adult medical examination without abnormal findings: Secondary | ICD-10-CM

## 2019-08-30 NOTE — Patient Instructions (Addendum)
Martha Montgomery , Thank you for taking time to come for your Medicare Wellness Visit. I appreciate your ongoing commitment to your health goals. Please review the following plan we discussed and let me know if I can assist you in the future.   Please continue to practice social distancing to keep you, your family, and our community safe.  If you must go out, please wear a Mask and practice good handwashing.  Screening recommendations/referrals: Colonoscopy: Due at 62-58 years of age 44: Due at 63 Bone Density:  Due at 38 years of age Recommended yearly ophthalmology/optometry visit for glaucoma screening and checkup Recommended yearly dental visit for hygiene and checkup  Vaccinations: Influenza vaccine: up to date Pneumococcal vaccine: not due yet Tdap vaccine: due 03/2020 Shingles vaccine: no due yet  Advanced directives: Please let us know if you have questions   Conditions/risks identified: Falls  Next appointment: 09/07/2019  Preventive Care  Female Preventive care refers to lifestyle choices and visits with your health care provider that can promote health and wellness. What does preventive care include?  A yearly physical exam. This is also called an annual well check.  Dental exams once or twice a year.  Routine eye exams. Ask your health care provider how often you should have your eyes checked.  Personal lifestyle choices, including:  Daily care of your teeth and gums.  Regular physical activity.  Eating a healthy diet.  Avoiding tobacco and drug use.  Limiting alcohol use.  Practicing safe sex.  Taking low-dose aspirin daily starting at age 70.  Taking vitamin and mineral supplements as recommended by your health care provider. What happens during an annual well check? The services and screenings done by your health care provider during your annual well check will depend on your age, overall health, lifestyle risk factors, and family history of  disease. Counseling  Your health care provider may ask you questions about your:  Alcohol use.  Tobacco use.  Drug use.  Emotional well-being.  Home and relationship well-being.  Sexual activity.  Eating habits.  Work and work Statistician.  Method of birth control.  Menstrual cycle.  Pregnancy history. Screening  You may have the following tests or measurements:  Height, weight, and BMI.  Blood pressure.  Lipid and cholesterol levels. These may be checked every 5 years, or more frequently if you are over 40 years old.  Skin check.  Lung cancer screening. You may have this screening every year starting at age 43 if you have a 30-pack-year history of smoking and currently smoke or have quit within the past 15 years.  Fecal occult blood test (FOBT) of the stool. You may have this test every year starting at age 76.  Flexible sigmoidoscopy or colonoscopy. You may have a sigmoidoscopy every 5 years or a colonoscopy every 10 years starting at age 52.  Hepatitis C blood test.  Hepatitis B blood test.  Sexually transmitted disease (STD) testing.  Diabetes screening. This is done by checking your blood sugar (glucose) after you have not eaten for a while (fasting). You may have this done every 1-3 years.  Mammogram. This may be done every 1-2 years. Talk to your health care provider about when you should start having regular mammograms. This may depend on whether you have a family history of breast cancer.  BRCA-related cancer screening. This may be done if you have a family history of breast, ovarian, tubal, or peritoneal cancers.  Pelvic exam and Pap test. This may be  done every 3 years starting at age 83. Starting at age 9, this may be done every 5 years if you have a Pap test in combination with an HPV test.  Bone density scan. This is done to screen for osteoporosis. You may have this scan if you are at high risk for osteoporosis. Discuss your test results,  treatment options, and if necessary, the need for more tests with your health care provider. Vaccines  Your health care provider may recommend certain vaccines, such as:  Influenza vaccine. This is recommended every year.  Tetanus, diphtheria, and acellular pertussis (Tdap, Td) vaccine. You may need a Td booster every 10 years.  Zoster vaccine. You may need this after age 68.  Pneumococcal 13-valent conjugate (PCV13) vaccine. You may need this if you have certain conditions and were not previously vaccinated.  Pneumococcal polysaccharide (PPSV23) vaccine. You may need one or two doses if you smoke cigarettes or if you have certain conditions. Talk to your health care provider about which screenings and vaccines you need and how often you need them. This information is not intended to replace advice given to you by your health care provider. Make sure you discuss any questions you have with your health care provider. Document Released: 08/10/2015 Document Revised: 04/02/2016 Document Reviewed: 05/15/2015 Elsevier Interactive Patient Education  2017 [place] Prevention in the Home Falls can cause injuries. They can happen to people of all ages. There are many things you can do to make your home safe and to help prevent falls. What can I do on the outside of my home?  Regularly fix the edges of walkways and driveways and fix any cracks.  Remove anything that might make you trip as you walk through a door, such as a raised step or threshold.  Trim any bushes or trees on the path to your home.  Use bright outdoor lighting.  Clear any walking paths of anything that might make someone trip, such as rocks or tools.  Regularly check to see if handrails are loose or broken. Make sure that both sides of any steps have handrails.  Any raised decks and porches should have guardrails on the edges.  Have any leaves, snow, or ice cleared regularly.  Use sand or salt on walking  paths during winter.  Clean up any spills in your garage right away. This includes oil or grease spills. What can I do in the bathroom?  Use night lights.  Install grab bars by the toilet and in the tub and shower. Do not use towel bars as grab bars.  Use non-skid mats or decals in the tub or shower.  If you need to sit down in the shower, use a plastic, non-slip stool.  Keep the floor dry. Clean up any water that spills on the floor as soon as it happens.  Remove soap buildup in the tub or shower regularly.  Attach bath mats securely with double-sided non-slip rug tape.  Do not have throw rugs and other things on the floor that can make you trip. What can I do in the bedroom?  Use night lights.  Make sure that you have a light by your bed that is easy to reach.  Do not use any sheets or blankets that are too big for your bed. They should not hang down onto the floor.  Have a firm chair that has side arms. You can use this for support while you get dressed.  Do not  have throw rugs and other things on the floor that can make you trip. What can I do in the kitchen?  Clean up any spills right away.  Avoid walking on wet floors.  Keep items that you use a lot in easy-to-reach places.  If you need to reach something above you, use a strong step stool that has a grab bar.  Keep electrical cords out of the way.  Do not use floor polish or wax that makes floors slippery. If you must use wax, use non-skid floor wax.  Do not have throw rugs and other things on the floor that can make you trip. What can I do with my stairs?  Do not leave any items on the stairs.  Make sure that there are handrails on both sides of the stairs and use them. Fix handrails that are broken or loose. Make sure that handrails are as long as the stairways.  Check any carpeting to make sure that it is firmly attached to the stairs. Fix any carpet that is loose or worn.  Avoid having throw rugs at  the top or bottom of the stairs. If you do have throw rugs, attach them to the floor with carpet tape.  Make sure that you have a light switch at the top of the stairs and the bottom of the stairs. If you do not have them, ask someone to add them for you. What else can I do to help prevent falls?  Wear shoes that:  Do not have high heels.  Have rubber bottoms.  Are comfortable and fit you well.  Are closed at the toe. Do not wear sandals.  If you use a stepladder:  Make sure that it is fully opened. Do not climb a closed stepladder.  Make sure that both sides of the stepladder are locked into place.  Ask someone to hold it for you, if possible.  Clearly mark and make sure that you can see:  Any grab bars or handrails.  First and last steps.  Where the edge of each step is.  Use tools that help you move around (mobility aids) if they are needed. These include:  Canes.  Walkers.  Scooters.  Crutches.  Turn on the lights when you go into a dark area. Replace any light bulbs as soon as they burn out.  Set up your furniture so you have a clear path. Avoid moving your furniture around.  If any of your floors are uneven, fix them.  If there are any pets around you, be aware of where they are.  Review your medicines with your doctor. Some medicines can make you feel dizzy. This can increase your chance of falling. Ask your doctor what other things that you can do to help prevent falls. This information is not intended to replace advice given to you by your health care provider. Make sure you discuss any questions you have with your health care provider. Document Released: 05/10/2009 Document Revised: 12/20/2015 Document Reviewed: 08/18/2014 Elsevier Interactive Patient Education  2017 [employer].

## 2019-08-30 NOTE — Progress Notes (Signed)
Subjective:   Martha Montgomery is a 39 y.o. female who presents for Medicare Annual (Subsequent) preventive examination.  Location of Patient: Home Location of Provider: Telehealth Consent was obtain for visit to be over via telehealth. I verified that I am speaking with the correct person using two identifiers.   Review of Systems:    Cardiac Risk Factors include: hypertension;obesity (BMI >30kg/m2)     Objective:     Vitals: BP (!) 125/95   Pulse 79   Resp 15   Ht 5\' 3"  (1.6 m)   Wt 208 lb (94.3 kg)   BMI 36.85 kg/m   Body mass index is 36.85 kg/m.  Advanced Directives 08/12/2018 06/08/2018 05/10/2018 12/30/2017 10/22/2017 10/20/2017 10/13/2017  Does Patient Have a Medical Advance Directive? Yes No No No No No No  Does patient want to make changes to medical advance directive? Yes (ED - Information included in AVS) - - - - - -  Would patient like information on creating a medical advance directive? - - - No - Patient declined No - Patient declined No - Patient declined No - Patient declined  Pre-existing out of facility DNR order (yellow form or pink MOST form) - - - - - - -    Tobacco Social History   Tobacco Use  Smoking Status Former Smoker  . Packs/day: 1.00  . Years: 5.00  . Pack years: 5.00  . Types: E-cigarettes  . Quit date: 09/11/2010  . Years since quitting: 8.9  Smokeless Tobacco Never Used  Tobacco Comment   QUIT SMOKING X 3 YEARS AGO,Vaping now     Counseling given: Yes Comment: QUIT SMOKING X 3 YEARS AGO,Vaping now   Clinical Intake:  Pre-visit preparation completed: Yes  Pain : 0-10 Pain Score: 5  Pain Type: Chronic pain Pain Location: Back Pain Orientation: Lower Pain Descriptors / Indicators: Constant, Tightness Pain Onset: More than a month ago Pain Frequency: Constant Pain Relieving Factors: pain medication Effect of Pain on Daily Activities: yes  Pain Relieving Factors: pain medication  BMI - recorded: 36.85 Nutritional Status:  BMI > 30  Obese Nutritional Risks: None Diabetes: No  How often do you need to have someone help you when you read instructions, pamphlets, or other written materials from your doctor or pharmacy?: 1 - Never What is the last grade level you completed in school?: some college  Interpreter Needed?: No     Past Medical History:  Diagnosis Date  . Anxiety   . Anxiety disorder   . Arthritis    "knees, joints, hands, toes" (09/28/2013)  . Depression   . Depression, major, single episode, in partial remission ([place]) 10/18/2008   Qualifier: Diagnosis of  By: Moshe Cipro MD, Margaret  PHQ 9 score of 7 in 10/2017, not suicidal or homicidal, interested in telepsych however, score of 16 in  02/2018, not suicidal or homicidal, start medication and refer to telepsych Score of 7 in 07/2018   . Fibromyalgia   . Gastritis nov. 2011   EGD by Dr. Oneida Alar, negative h pylori  . GERD (gastroesophageal reflux disease)   . Gout   . Hypertension x 6 years   . Hypothyroidism   . Migraines    "15/month average" (09/28/2013)  . Spinal stenosis   . Thyroid goiter    Past Surgical History:  Procedure Laterality Date  . BIOPSY  10/20/2017   Procedure: BIOPSY;  Surgeon: Danie Binder, MD;  [facility];  Service: Endoscopy;;  gastric duodenal esophagus  .  CESAREAN SECTION  2008  . CHONDROPLASTY  04/02/2012   Procedure: CHONDROPLASTY;  Surgeon: Carole Civil, MD;  Location: [facility];  Service: Orthopedics;  Laterality: Left;  of left patella  . COLONOSCOPY  May 2012   Dr. Oneida Alar: normal colon, small internal hemorrhoids  . COLONOSCOPY WITH PROPOFOL N/A 10/22/2017   Procedure: COLONOSCOPY WITH PROPOFOL;  Surgeon: Danie Binder, MD;  [facility];  Service: Endoscopy;  Laterality: N/A;  7:30am  . COLONOSCOPY WITH PROPOFOL N/A 10/20/2017   Procedure: COLONOSCOPY WITH PROPOFOL;  Surgeon: Danie Binder, MD;  [facility];  Service: Endoscopy;  Laterality: N/A;  11:00am  .  ESOPHAGOGASTRODUODENOSCOPY (EGD) WITH PROPOFOL N/A 10/20/2017   Procedure: ESOPHAGOGASTRODUODENOSCOPY (EGD) WITH PROPOFOL;  Surgeon: Danie Binder, MD;  [facility];  Service: Endoscopy;  Laterality: N/A;  . INCISION AND DRAINAGE ABSCESS  1997; 2005   "under right buttocks; under left arm"  . SAVORY DILATION N/A 10/20/2017   Procedure: SAVORY DILATION;  Surgeon: Danie Binder, MD;  [facility];  Service: Endoscopy;  Laterality: N/A;  . THYROIDECTOMY N/A 09/28/2013   Procedure: TOTAL THYROIDECTOMY;  Surgeon: Ascencion Dike, MD;  Location: [facility];  Service: ENT;  Laterality: N/A;  . TOTAL THYROIDECTOMY  09/27/2013  . UPPER GI ENDOSCOPY  2011   EGD: gastritis, no h.pylori   Family History  Problem Relation Age of Onset  . Arthritis Mother   . Migraines Mother   . Hypertension Mother   . Cancer Maternal Grandmother        lung  . Asthma Other        family history   . Colon cancer Neg Hx    Social History   Socioeconomic History  . Marital status: Legally Separated    Spouse name: Not on file  . Number of children: 1  . Years of education: 52  . Highest education level: 12th grade  Occupational History  . Occupation: Hospital doctor: UNEMPLOYED  Tobacco Use  . Smoking status: Former Smoker    Packs/day: 1.00    Years: 5.00    Pack years: 5.00    Types: E-cigarettes    Quit date: 09/11/2010    Years since quitting: 8.9  . Smokeless tobacco: Never Used  . Tobacco comment: QUIT SMOKING X 3 YEARS AGO,Vaping now  Substance and Sexual Activity  . Alcohol use: No  . Drug use: No  . Sexual activity: Not Currently    Partners: Male    Birth control/protection: Other-see comments, Injection    Comment: Depo  Other Topics Concern  . Not on file  Social History Narrative   Pt is currently on medicaid. From [place] and came to [facility] in mid 2010   Is separated from Husband, lives with Mother right now    Social  Determinants of Health   Financial Resource Strain:   . Difficulty of Paying Living Expenses: Not on file  Food Insecurity:   . Worried About Charity fundraiser in the Last Year: Not on file  . Ran Out of Food in the Last Year: Not on file  Transportation Needs:   . Lack of Transportation (Medical): Not on file  . Lack of Transportation (Non-Medical): Not on file  Physical Activity:   . Days of Exercise per Week: Not on file  . Minutes of Exercise per Session: Not on file  Stress:   . Feeling of  Stress : Not on file  Social Connections:   . Frequency of Communication with Friends and Family: Not on file  . Frequency of Social Gatherings with Friends and Family: Not on file  . Attends Religious Services: Not on file  . Active Member of Clubs or Organizations: Not on file  . Attends Archivist Meetings: Not on file  . Marital Status: Not on file    Outpatient Encounter Medications as of 08/30/2019  Medication Sig  . AMITIZA 24 MCG capsule TAKE 1 CAPSULE BY MOUTH TWICE DAILY WITH A MEAL  . amLODipine (NORVASC) 5 MG tablet Take 1 tablet (5 mg total) by mouth daily.  . Calcium Polycarbophil (FIBER-CAPS PO) Take 2 capsules by mouth 3 (three) times daily.  . fluticasone (FLONASE) 50 MCG/ACT nasal spray Place 1 spray into both nostrils daily as needed for allergies or rhinitis.  . hydrochlorothiazide (HYDRODIURIL) 25 MG tablet Take 25 mg by mouth daily. Once daily   . levothyroxine (SYNTHROID, LEVOTHROID) 200 MCG tablet TAKE 1 TABLET BY MOUTH EVERY DAY BEFORE BREAKFAST  . medroxyPROGESTERone (DEPO-PROVERA) 150 MG/ML injection Inject 150 mg into the muscle every 3 (three) months.  . metoprolol tartrate (LOPRESSOR) 50 MG tablet TAKE 1 TABLET BY MOUTH TWICE DAILY  . pantoprazole (PROTONIX) 40 MG tablet Take 1 tablet (40 mg total) by mouth 2 (two) times daily before a meal.  . potassium chloride (K-DUR) 10 MEQ tablet Take 1 tablet (10 mEq total) by mouth daily.  . pregabalin (LYRICA)  100 MG capsule Take 100 mg by mouth 3 (three) times daily.  . traMADol (ULTRAM) 50 MG tablet Take by mouth every 12 (twelve) hours as needed.   No facility-administered encounter medications on file as of 08/30/2019.    Activities of Daily Living In your present state of health, do you have any difficulty performing the following activities: 08/30/2019  Hearing? N  Vision? N  Difficulty concentrating or making decisions? Y  Walking or climbing stairs? Y  Dressing or bathing? Y  Doing errands, shopping? N  Preparing Food and eating ? N  Using the Toilet? N  In the past six months, have you accidently leaked urine? N  Do you have problems with loss of bowel control? N  Managing your Medications? N  Managing your Finances? N  Housekeeping or managing your Housekeeping? N  Some recent data might be hidden    Patient Care Team: Fayrene Helper, MD as PCP - General Danie Binder, MD (Gastroenterology)    Assessment:   This is a routine wellness examination for [place].  Exercise Activities and Dietary recommendations Current Exercise Habits: Home exercise routine, Type of exercise: stretching;Other - see comments(cycling machine), Time (Minutes): 30, Frequency (Times/Week): 2, Weekly Exercise (Minutes/Week): 60, Intensity: Mild, Exercise limited by: None identified  Goals    . DIET - INCREASE WATER INTAKE    . Increase physical activity    . Patient Stated     I want to start a plant based diet        Fall Risk Fall Risk  08/30/2019 08/03/2019 04/20/2019 10/14/2018 09/14/2018  Falls in the past year? 0 0 0 0 0  Number falls in past yr: 0 0 0 - -  Injury with Fall? 0 0 0 0 -  Risk for fall due to : - - - - -  Follow up - - - - Falls evaluation completed   Is the patient's home free of loose throw rugs in walkways, pet beds,  electrical cords, etc?   yes      Grab bars in the bathroom? no      Handrails on the stairs?   yes      Adequate lighting?   yes     Depression  Screen PHQ 2/9 Scores 08/30/2019 08/03/2019 04/20/2019 10/14/2018  PHQ - 2 Score 0 0 0 0  PHQ- 9 Score - 0 - 3     Cognitive Function     6CIT Screen 08/30/2019 08/12/2018 06/22/2017  What Year? 0 points 0 points 0 points  What month? 0 points 0 points 0 points  What time? 0 points 3 points 0 points  Count back from 20 0 points 0 points 0 points  Months in reverse 0 points 0 points 0 points  Repeat phrase 0 points 0 points 0 points  Total Score 0 3 0    Immunization History  Administered Date(s) Administered  . Hpv 10/18/2008, 03/07/2009  . Influenza Split 03/26/2016  . Influenza Whole 06/07/2009, 03/28/2010  . Influenza,inj,Quad PF,6+ Mos 05/30/2014, 06/28/2015, 06/22/2017, 03/16/2018, 04/20/2019  . Influenza-Unspecified 03/28/2013  . Td 03/07/2009    Qualifies for Shingles Vaccine? n/a  Screening Tests Health Maintenance  Topic Date Due  . TETANUS/TDAP  04/19/2020 (Originally 03/08/2019)  . PAP SMEAR-Modifier  08/02/2022  . INFLUENZA VACCINE  Completed  . HIV Screening  Completed    Cancer Screenings: Lung: Low Dose CT Chest recommended if Age 70-80 years, 30 pack-year currently smoking OR have quit w/in 15years. Patient does not qualify. Breast:  Up to date on Mammogram? Yes   Up to date of Bone Density/Dexa? No n/a Colorectal:  n/a  Additional Screenings:   Hepatitis C Screening:  n/a     Plan:       1. Encounter for Medicare annual wellness exam   I have personally reviewed and noted the following in the patient's chart:   . Medical and social history . Use of alcohol, tobacco or illicit drugs  . Current medications and supplements . Functional ability and status . Nutritional status . Physical activity . Advanced directives . List of other physicians . Hospitalizations, surgeries, and ER visits in previous 12 months . Vitals . Screenings to include cognitive, depression, and falls . Referrals and appointments  In addition, I have reviewed and  discussed with patient certain preventive protocols, quality metrics, and best practice recommendations. A written personalized care plan for preventive services as well as general preventive health recommendations were provided to patient.     I provided 20 minutes of non-face-to-face time during this encounter.    Perlie Mayo, NP  08/30/2019

## 2019-09-05 ENCOUNTER — Encounter: Payer: Self-pay | Admitting: Family Medicine

## 2019-09-07 ENCOUNTER — Ambulatory Visit: Payer: Medicare Other | Admitting: Family Medicine

## 2019-09-13 DIAGNOSIS — M797 Fibromyalgia: Secondary | ICD-10-CM | POA: Diagnosis not present

## 2019-09-13 DIAGNOSIS — M130 Polyarthritis, unspecified: Secondary | ICD-10-CM | POA: Diagnosis not present

## 2019-09-13 DIAGNOSIS — M25569 Pain in unspecified knee: Secondary | ICD-10-CM | POA: Diagnosis not present

## 2019-09-26 ENCOUNTER — Ambulatory Visit (INDEPENDENT_AMBULATORY_CARE_PROVIDER_SITE_OTHER): Payer: Medicare Other | Admitting: Family Medicine

## 2019-09-26 ENCOUNTER — Other Ambulatory Visit: Payer: Self-pay

## 2019-09-26 ENCOUNTER — Encounter: Payer: Self-pay | Admitting: Family Medicine

## 2019-09-26 VITALS — BP 118/86 | HR 97 | Temp 98.5°F | Resp 15 | Ht 63.0 in | Wt 204.0 lb

## 2019-09-26 DIAGNOSIS — J302 Other seasonal allergic rhinitis: Secondary | ICD-10-CM

## 2019-09-26 DIAGNOSIS — I10 Essential (primary) hypertension: Secondary | ICD-10-CM

## 2019-09-26 DIAGNOSIS — G8929 Other chronic pain: Secondary | ICD-10-CM

## 2019-09-26 DIAGNOSIS — M5441 Lumbago with sciatica, right side: Secondary | ICD-10-CM | POA: Diagnosis not present

## 2019-09-26 DIAGNOSIS — M5442 Lumbago with sciatica, left side: Secondary | ICD-10-CM

## 2019-09-26 DIAGNOSIS — E890 Postprocedural hypothyroidism: Secondary | ICD-10-CM | POA: Diagnosis not present

## 2019-09-26 DIAGNOSIS — K219 Gastro-esophageal reflux disease without esophagitis: Secondary | ICD-10-CM

## 2019-09-26 NOTE — Patient Instructions (Signed)
F/u in office with MD  End August, call if you need me before   Please get fasting labs already ordered this week  Please schedule Depo appt with Nurse  It is important that you exercise regularly at least 30 minutes 5 times a week. If you develop chest pain, have severe difficulty breathing, or feel very tired, stop exercising immediately and seek medical attention    Think about what you will eat, plan ahead. Choose " clean, green, fresh or frozen" over canned, processed or packaged foods which are more sugary, salty and fatty. 70 to 75% of food eaten should be vegetables and fruit. Three meals at set times with snacks allowed between meals, but they must be fruit or vegetables. Aim to eat over a 12 hour period , example 7 am to 7 pm, and STOP after  your last meal of the day. Drink water,generally about 64 ounces per day, no other drink is as healthy. Fruit juice is best enjoyed in a healthy way, by EATING the fruit.   Thanks for choosing [facility], we consider it a privelige to serve you.

## 2019-09-29 ENCOUNTER — Encounter: Payer: Self-pay | Admitting: Family Medicine

## 2019-09-29 NOTE — Assessment & Plan Note (Signed)
Obesity linked with hypertension and arthritis  Patient re-educated about  the importance of commitment to a  minimum of 150 minutes of exercise per week as able.  The importance of healthy food choices with portion control discussed, as well as eating regularly and within a 12 hour window most days. The need to choose "clean , green" food 50 to 75% of the time is discussed, as well as to make water the primary drink and set a goal of 64 ounces water daily.    Weight /BMI 09/26/2019 08/30/2019 08/16/2019  WEIGHT 204 lb 208 lb 205 lb 3.2 oz  HEIGHT 5\' 3"  5\' 3"  5\' 3"   BMI 36.14 kg/m2 36.85 kg/m2 36.35 kg/m2

## 2019-09-29 NOTE — Progress Notes (Signed)
   Martha Montgomery     MRN: [id number]      DOB: 1981/05/24   HPI Martha Montgomery is here for follow up and re-evaluation of chronic medical conditions, medication management and review of any available recent lab and radiology data.  Preventive health is updated, specifically  Cancer screening and Immunization.   Questions or concerns regarding consultations or procedures which the PT has had in the interim are  addressed. The PT denies any adverse reactions to current medications since the last visit.  There are no new concerns.  There are no specific complaints   ROS Denies recent fever or chills. Denies sinus pressure, nasal congestion, ear pain or sore throat. Denies chest congestion, productive cough or wheezing. Denies chest pains, palpitations and leg swelling Denies abdominal pain, nausea, vomiting,diarrhea or constipation.   Denies dysuria, frequency, hesitancy or incontinence. Denies joint pain, swelling and limitation in mobility. Denies headaches, seizures, numbness, or tingling. Denies  depression, anxiety or insomnia. Denies skin break down or rash.   PE  BP 118/86   Pulse 97   Temp 98.5 F (36.9 C) (Temporal)   Resp 15   Ht 5\' 3"  (1.6 m)   Wt 204 lb (92.5 kg)   SpO2 99%   BMI 36.14 kg/m   Patient alert and oriented and in no cardiopulmonary distress.  HEENT: No facial asymmetry, EOMI,     Neck supple .  Chest: Clear to auscultation bilaterally.  CVS: S1, S2 no murmurs, no S3.Regular rate.  ABD: Soft non tender.   Ext: No edema  MS: Adequate ROM spine, shoulders, hips and knees.  Skin: Intact, no ulcerations or rash noted.  Psych: Good eye contact, normal affect. Memory intact not anxious or depressed appearing.  CNS: CN 2-12 intact, power,  normal throughout.no focal deficits noted.   Assessment & Plan  HTN (hypertension), benign Controlled, no change in medication DASH diet and commitment to daily physical activity for a minimum of 30 minutes  discussed and encouraged, as a part of hypertension management. The importance of attaining a healthy weight is also discussed.  BP/Weight 09/26/2019 08/30/2019 08/16/2019 08/03/2019 04/20/2019 02/08/2019 0000000  Systolic BP 123456 0000000 [id number] [id number] [id number]  Diastolic BP 86 95 95 98 78 79 80  Wt. (Lbs) 204 208 205.2 208.12 202 202.4 204  BMI 36.14 36.85 36.35 36.87 35.78 35.85 36.14       Post-surgical hypothyroidism Managed by Endo Updated lab needed at/ before next visit.   Morbid obesity ([place]) Obesity linked with hypertension and arthritis  Patient re-educated about  the importance of commitment to a  minimum of 150 minutes of exercise per week as able.  The importance of healthy food choices with portion control discussed, as well as eating regularly and within a 12 hour window most days. The need to choose "clean , green" food 50 to 75% of the time is discussed, as well as to make water the primary drink and set a goal of 64 ounces water daily.    Weight /BMI 09/26/2019 08/30/2019 08/16/2019  WEIGHT 204 lb 208 lb 205 lb 3.2 oz  HEIGHT 5\' 3"  5\' 3"  5\' 3"   BMI 36.14 kg/m2 36.85 kg/m2 36.35 kg/m2      Back pain Managed by neurology and controlled  Allergic rhinitis Controlled, no change in medication   GERD (gastroesophageal reflux disease) Controlled, no change in medication

## 2019-09-29 NOTE — Assessment & Plan Note (Signed)
Controlled, no change in medication  

## 2019-09-29 NOTE — Assessment & Plan Note (Signed)
Managed by neurology and controlled 

## 2019-09-29 NOTE — Assessment & Plan Note (Signed)
Controlled, no change in medication DASH diet and commitment to daily physical activity for a minimum of 30 minutes discussed and encouraged, as a part of hypertension management. The importance of attaining a healthy weight is also discussed.  BP/Weight 09/26/2019 08/30/2019 08/16/2019 08/03/2019 04/20/2019 02/08/2019 0000000  Systolic BP 123456 0000000 [id number] 123456 [id number]  Diastolic BP 86 95 95 98 78 79 80  Wt. (Lbs) 204 208 205.2 208.12 202 202.4 204  BMI 36.14 36.85 36.35 36.87 35.78 35.85 36.14

## 2019-09-29 NOTE — Assessment & Plan Note (Signed)
Managed by Endo Updated lab needed at/ before next visit.

## 2019-11-04 ENCOUNTER — Other Ambulatory Visit: Payer: Self-pay | Admitting: "Endocrinology

## 2019-11-04 ENCOUNTER — Telehealth: Payer: Self-pay | Admitting: "Endocrinology

## 2019-11-04 ENCOUNTER — Other Ambulatory Visit: Payer: Self-pay

## 2019-11-04 DIAGNOSIS — E890 Postprocedural hypothyroidism: Secondary | ICD-10-CM

## 2019-11-04 MED ORDER — LEVOTHYROXINE SODIUM 200 MCG PO TABS: ORAL_TABLET | ORAL | 0 refills | Status: DC

## 2019-11-04 NOTE — Telephone Encounter (Signed)
Which labs does pt need, has not been here since 09/2018

## 2019-11-04 NOTE — Telephone Encounter (Signed)
Tried to reach pt to sch. No answer

## 2019-11-04 NOTE — Telephone Encounter (Signed)
TSH , Free t4. I ordered them.

## 2019-11-16 ENCOUNTER — Other Ambulatory Visit: Payer: Self-pay | Admitting: "Endocrinology

## 2019-11-16 DIAGNOSIS — E890 Postprocedural hypothyroidism: Secondary | ICD-10-CM | POA: Diagnosis not present

## 2019-11-16 DIAGNOSIS — E559 Vitamin D deficiency, unspecified: Secondary | ICD-10-CM | POA: Diagnosis not present

## 2019-11-16 DIAGNOSIS — R7989 Other specified abnormal findings of blood chemistry: Secondary | ICD-10-CM | POA: Diagnosis not present

## 2019-11-16 DIAGNOSIS — R7301 Impaired fasting glucose: Secondary | ICD-10-CM | POA: Diagnosis not present

## 2019-11-17 ENCOUNTER — Encounter: Payer: Self-pay | Admitting: Family Medicine

## 2019-11-17 ENCOUNTER — Other Ambulatory Visit: Payer: Self-pay | Admitting: Family Medicine

## 2019-11-17 LAB — LIPID PANEL
Cholesterol: 220 mg/dL — ABNORMAL HIGH (ref <200)
HDL: 49 mg/dL — ABNORMAL LOW (ref >=50)
LDL Cholesterol (Calc): 153 mg/dL (calc) — ABNORMAL HIGH
Non-HDL Cholesterol (Calc): 171 mg/dL (calc) — ABNORMAL HIGH (ref <130)
Total CHOL/HDL Ratio: 4.5 (calc) (ref <5.0)
Triglycerides: 76 mg/dL (ref <150)

## 2019-11-17 LAB — COMPLETE METABOLIC PANEL WITH GFR
AG Ratio: 1.7 (calc) (ref 1.0–2.5)
ALT: 13 U/L (ref 6–29)
AST: 21 U/L (ref 10–30)
Albumin: 4.4 g/dL (ref 3.6–5.1)
Alkaline phosphatase (APISO): 53 U/L (ref 31–125)
BUN: 9 mg/dL (ref 7–25)
CO2: 24 mmol/L (ref 20–32)
Calcium: 9.3 mg/dL (ref 8.6–10.2)
Chloride: 108 mmol/L (ref 98–110)
Creat: 0.95 mg/dL (ref 0.50–1.10)
GFR, Est African American: 89 mL/min/{1.73_m2} (ref >=60)
GFR, Est Non African American: 77 mL/min/{1.73_m2} (ref >=60)
Globulin: 2.6 g/dL (calc) (ref 1.9–3.7)
Glucose, Bld: 103 mg/dL (ref 65–139)
Potassium: 4.3 mmol/L (ref 3.5–5.3)
Sodium: 140 mmol/L (ref 135–146)
Total Bilirubin: 0.4 mg/dL (ref 0.2–1.2)
Total Protein: 7 g/dL (ref 6.1–8.1)

## 2019-11-17 LAB — CBC
HCT: 36.5 % (ref 35.0–45.0)
Hemoglobin: 12.2 g/dL (ref 11.7–15.5)
MCH: 30.5 pg (ref 27.0–33.0)
MCHC: 33.4 g/dL (ref 32.0–36.0)
MCV: 91.3 fL (ref 80.0–100.0)
MPV: 10.6 fL (ref 7.5–12.5)
Platelets: 439 10*3/uL — ABNORMAL HIGH (ref 140–400)
RBC: 4 10*6/uL (ref 3.80–5.10)
RDW: 13.1 % (ref 11.0–15.0)
WBC: 7.2 10*3/uL (ref 3.8–10.8)

## 2019-11-17 LAB — TSH
TSH: 8.88 u[IU]/mL — ABNORMAL HIGH (ref 0.450–4.500)
TSH: 8.83 mIU/L — ABNORMAL HIGH

## 2019-11-17 LAB — HEMOGLOBIN A1C
Hgb A1c MFr Bld: 5.5 % of total Hgb (ref <5.7)
Mean Plasma Glucose: 111 (calc)
eAG (mmol/L): 6.2 (calc)

## 2019-11-17 LAB — VITAMIN D 25 HYDROXY (VIT D DEFICIENCY, FRACTURES): Vit D, 25-Hydroxy: 20 ng/mL — ABNORMAL LOW (ref 30–100)

## 2019-11-17 LAB — T4, FREE
Free T4: 1.27 ng/dL (ref 0.82–1.77)
Free T4: 1.3 ng/dL (ref 0.8–1.8)

## 2019-11-17 MED ORDER — LEVOTHYROXINE SODIUM 200 MCG PO TABS: ORAL_TABLET | ORAL | 3 refills | Status: DC

## 2019-11-17 NOTE — Progress Notes (Signed)
Synthroid , will verify compliance before dose increase

## 2019-11-17 NOTE — Progress Notes (Signed)
Synthroid 

## 2019-11-21 ENCOUNTER — Telehealth: Payer: Self-pay

## 2019-11-21 DIAGNOSIS — E890 Postprocedural hypothyroidism: Secondary | ICD-10-CM

## 2019-11-21 NOTE — Telephone Encounter (Signed)
-----   Message from Fayrene Helper, MD sent at 11/17/2019  2:01 PM EDT ----- Needs inc in dose of synthroid, 200 mcg daily on Monday, Wed, Friday, One and a half, on Tuesday, Thursday, Saturday and Sunday Needs rept TSH in 6 weeks please. Needs to lower fat intake , cholesterol still high Needs to increase vit D in diet, look at foods, with soy allergy the prescription is flagged . Kidney kidney and liver functionare good, blood count is nornal

## 2019-12-02 ENCOUNTER — Other Ambulatory Visit: Payer: Self-pay | Admitting: "Endocrinology

## 2019-12-25 ENCOUNTER — Encounter: Payer: Self-pay | Admitting: Family Medicine

## 2019-12-29 ENCOUNTER — Encounter: Payer: Self-pay | Admitting: Family Medicine

## 2019-12-29 ENCOUNTER — Other Ambulatory Visit: Payer: Self-pay

## 2019-12-29 ENCOUNTER — Ambulatory Visit (INDEPENDENT_AMBULATORY_CARE_PROVIDER_SITE_OTHER): Payer: Medicare Other | Admitting: Family Medicine

## 2019-12-29 VITALS — BP 148/100 | HR 91 | Temp 97.1°F | Resp 15 | Ht 63.0 in | Wt 200.0 lb

## 2019-12-29 DIAGNOSIS — Z3042 Encounter for surveillance of injectable contraceptive: Secondary | ICD-10-CM | POA: Diagnosis not present

## 2019-12-29 DIAGNOSIS — I10 Essential (primary) hypertension: Secondary | ICD-10-CM | POA: Diagnosis not present

## 2019-12-29 DIAGNOSIS — F321 Major depressive disorder, single episode, moderate: Secondary | ICD-10-CM

## 2019-12-29 LAB — POCT URINE PREGNANCY: Preg Test, Ur: NEGATIVE

## 2019-12-29 MED ORDER — MEDROXYPROGESTERONE ACETATE 150 MG/ML IM SUSP
150.0000 mg | Freq: Once | INTRAMUSCULAR | Status: AC
  Administered 2019-12-29: 150 mg via INTRAMUSCULAR

## 2019-12-29 MED ORDER — BUPROPION HCL ER (XL) 150 MG PO TB24: 150.0000 mg | ORAL_TABLET | Freq: Every day | ORAL | 3 refills | Status: DC

## 2019-12-29 MED ORDER — AMLODIPINE BESYLATE 10 MG PO TABS: 10.0000 mg | ORAL_TABLET | Freq: Every day | ORAL | 3 refills | Status: DC

## 2019-12-29 NOTE — Patient Instructions (Signed)
F/U  With MD in 6 to 8 weeks , call if you me need me sooner  Depo Provera in the office today  New for blood pressure is amlodipine 10 mg one daily, stop HCTZ and potassium  New for depression is Wellbutrin , which you have benefited from in the past   Continue low fat diet with limited sugar  Thanks for choosing [facility], we consider it a privelige to serve you.

## 2020-01-01 ENCOUNTER — Encounter: Payer: Self-pay | Admitting: Family Medicine

## 2020-01-01 DIAGNOSIS — F321 Major depressive disorder, single episode, moderate: Secondary | ICD-10-CM | POA: Insufficient documentation

## 2020-01-01 NOTE — Assessment & Plan Note (Signed)
Urine pregnancy test as late, this is negative. Depo provera administered 150 mgIM

## 2020-01-01 NOTE — Progress Notes (Signed)
   Martha Montgomery     MRN: [id number]      DOB: 11-21-1980   HPI Martha Montgomery is here with a main c/o increased depression , uncontrolled , not suiicdal or homicidal, but disabling , starting approximately 3 months ago, has to do wih personal and family stress. Has benefited from antidepressant in the past, currently in therapy wit her son, wants tpo resume medication ROS Denies recent fever or chills. Denies sinus pressure, nasal congestion, ear pain or sore throat. Denies chest congestion, productive cough or wheezing. Denies chest pains, palpitations and leg swelling Denies abdominal pain, nausea, vomiting,diarrhea or constipation.   Denies dysuria, frequency, hesitancy or incontinence. Denies joint pain, swelling and limitation in mobility. Denies uncontrolled eadaches, seizures, numbness, or tingling.  Denies skin break down or rash.   PE  BP (!) 148/100   Pulse 91   Temp (!) 97.1 F (36.2 C) (Temporal)   Resp 15   Ht 5\' 3"  (1.6 m)   Wt 200 lb (90.7 kg)   SpO2 96%   BMI 35.43 kg/m   Patient alert and oriented and in no cardiopulmonary distress.  HEENT: No facial asymmetry, EOMI,     Neck supple .  Chest: Clear to auscultation bilaterally.  CVS: S1, S2 no murmurs, no S3.Regular rate.  ABD: Soft non tender.   Ext: No edema  MS: Adequate ROM spine, shoulders, hips and knees.  Skin: Intact, no ulcerations or rash noted.  Psych: Good eye contact, flat affect. Memory intact mildly  anxious and  depressed appearing.  CNS: CN 2-12 intact, power,  normal throughout.no focal deficits noted.   Assessment & Plan  HTN (hypertension), benign Uncontrolled, increase amlodipine and d/c hCTZ and potassium  DASH diet and commitment to daily physical activity for a minimum of 30 minutes discussed and encouraged, as a part of hypertension management. The importance of attaining a healthy weight is also discussed.  BP/Weight 12/29/2019 09/26/2019 08/30/2019 08/16/2019 08/03/2019  04/20/2019 9/62/2297  Systolic BP 989 211 941 740 814 481 856  Diastolic BP 314 86 95 95 98 78 79  Wt. (Lbs) 200 204 208 205.2 208.12 202 202.4  BMI 35.43 36.14 36.85 36.35 36.87 35.78 35.85     Fd/u in 8 weeks  Encounter for contraceptive management Urine pregnancy test as late, this is negative. Depo provera administered 150 mgIM  Morbid obesity ([place])  Patient re-educated about  the importance of commitment to a  minimum of 150 minutes of exercise per week as able.  The importance of healthy food choices with portion control discussed, as well as eating regularly and within a 12 hour window most days. The need to choose "clean , green" food 50 to 75% of the time is discussed, as well as to make water the primary drink and set a goal of 64 ounces water daily.    Weight /BMI 12/29/2019 09/26/2019 08/30/2019  WEIGHT 200 lb 204 lb 208 lb  HEIGHT 5\' 3"  5\' 3"  5\' 3"   BMI 35.43 kg/m2 36.14 kg/m2 36.85 kg/m2  improved which is great, needs to keep this up   Depression, major, single episode, moderate ([place]) Start wellborn, continue therapy , re eval in 8 weeks

## 2020-01-01 NOTE — Assessment & Plan Note (Signed)
Start wellborn, continue therapy , re eval in 8 weeks

## 2020-01-01 NOTE — Assessment & Plan Note (Signed)
Uncontrolled, increase amlodipine and d/c hCTZ and potassium  DASH diet and commitment to daily physical activity for a minimum of 30 minutes discussed and encouraged, as a part of hypertension management. The importance of attaining a healthy weight is also discussed.  BP/Weight 12/29/2019 09/26/2019 08/30/2019 08/16/2019 08/03/2019 04/20/2019 0/27/2536  Systolic BP 644 034 742 595 638 756 433  Diastolic BP 295 86 95 95 98 78 79  Wt. (Lbs) 200 204 208 205.2 208.12 202 202.4  BMI 35.43 36.14 36.85 36.35 36.87 35.78 35.85     Fd/u in 8 weeks

## 2020-01-01 NOTE — Assessment & Plan Note (Signed)
  Patient re-educated about  the importance of commitment to a  minimum of 150 minutes of exercise per week as able.  The importance of healthy food choices with portion control discussed, as well as eating regularly and within a 12 hour window most days. The need to choose "clean , green" food 50 to 75% of the time is discussed, as well as to make water the primary drink and set a goal of 64 ounces water daily.    Weight /BMI 12/29/2019 09/26/2019 08/30/2019  WEIGHT 200 lb 204 lb 208 lb  HEIGHT 5\' 3"  5\' 3"  5\' 3"   BMI 35.43 kg/m2 36.14 kg/m2 36.85 kg/m2  improved which is great, needs to keep this up

## 2020-01-09 ENCOUNTER — Other Ambulatory Visit: Payer: Self-pay | Admitting: Family Medicine

## 2020-01-09 ENCOUNTER — Encounter: Payer: Self-pay | Admitting: Family Medicine

## 2020-01-09 MED ORDER — BUPROPION HCL ER (XL) 300 MG PO TB24: 300.0000 mg | ORAL_TABLET | Freq: Every day | ORAL | 2 refills | Status: DC

## 2020-02-08 ENCOUNTER — Encounter: Payer: Self-pay | Admitting: Family Medicine

## 2020-02-14 ENCOUNTER — Encounter: Payer: Self-pay | Admitting: Nurse Practitioner

## 2020-02-14 ENCOUNTER — Ambulatory Visit (INDEPENDENT_AMBULATORY_CARE_PROVIDER_SITE_OTHER): Payer: Medicare Other | Admitting: Nurse Practitioner

## 2020-02-14 ENCOUNTER — Other Ambulatory Visit: Payer: Self-pay

## 2020-02-14 VITALS — BP 134/88 | HR 103 | Temp 96.8°F | Ht 63.0 in | Wt 195.8 lb

## 2020-02-14 DIAGNOSIS — K649 Unspecified hemorrhoids: Secondary | ICD-10-CM

## 2020-02-14 DIAGNOSIS — K5900 Constipation, unspecified: Secondary | ICD-10-CM | POA: Diagnosis not present

## 2020-02-14 DIAGNOSIS — K219 Gastro-esophageal reflux disease without esophagitis: Secondary | ICD-10-CM | POA: Diagnosis not present

## 2020-02-14 MED ORDER — LUBIPROSTONE 24 MCG PO CAPS: 24.0000 ug | ORAL_CAPSULE | Freq: Two times a day (BID) | ORAL | 3 refills | Status: DC

## 2020-02-14 NOTE — Progress Notes (Signed)
Referring Provider: Fayrene Helper, MD Primary Care Physician:  Fayrene Helper, MD Primary GI:  Dr. Gala Romney (in the absence of Dr. Oneida Alar); pending Dr. Abbey Chatters  Chief Complaint  Patient presents with  . Constipation  . Hemorrhoids    has questions about internal hemorrhoids  . Gastroesophageal Reflux    occ, protonix is helping    HPI:   Martha Montgomery is a 39 y.o. female who presents for follow-up on IBS and GERD.  The patient was last seen in our office 08/16/2019 for rectal bleeding, GERD, IBS.  Noted chronic history of IBS constipation type and GERD.  Colonoscopy up-to-date 2019 essentially normal with recommended 10-year repeat in 2029.  History of hemorrhoids as well that improved with Anusol.  At last visit noted taking fiber 2-3 times a day but still some constipation, rare Amitiza previously worked well for her.  She been on for couple months but now has a refill waiting at the pharmacy.  Bowel movement every day to every other day but incomplete emptying while out of Amitiza.  Stools are incomplete, hard, straining.  Still with some GERD despite Protonix daily, planning dietary changes to help with weight loss and GI symptoms.  She did have one occasion of scant flow with his hematochezia despite no constipation or straining.  Notes rare hemorrhoid symptoms.  Says on the commode up to 15 minutes but typically closer to 10 minutes.  No other overt GI complaints.  Recommended increase Protonix to 40 mg twice a day, restart Amitiza soon as possible, continue adequate fiber intake and water intake, call for any worsening or severe symptoms.  Otherwise follow-up in 6 months.  No further communication from the patient since then.  Today she states she's doing ok overall. Has been out of Amitiza for several months. Has been using OTC options such as MiraLAX but this isn't as effective. Has a bowel movement every 2 days at best, has been as long as 4-5 days; hard stools, minimal  straining. Has had some intermittent mild hematochezia. Typically stools [place] 1-3. Some mild nausea. No abdominal pain, vomiting, melena, fevers, chills, unintentional weight loss. GERD symptoms doing well on Protonix. Denies URI or flu-like symptoms. Denies loss of sense of taste or smell. The patient has received first dose of Pfizer COVID-19 vaccination. They have an appointment for the second dose in about 2 weeks. Denies chest pain, dyspnea, dizziness, lightheadedness, syncope, near syncope. Denies any other upper or lower GI symptoms.  Past Medical History:  Diagnosis Date  . Anxiety   . Anxiety disorder   . Arthritis    "knees, joints, hands, toes" (09/28/2013)  . Depression   . Depression, major, single episode, in partial remission ([place]) 10/18/2008   Qualifier: Diagnosis of  By: Moshe Cipro MD, Margaret  PHQ 9 score of 7 in 10/2017, not suicidal or homicidal, interested in telepsych however, score of 16 in  02/2018, not suicidal or homicidal, start medication and refer to telepsych Score of 7 in 07/2018   . Fibromyalgia   . Gastritis nov. 2011   EGD by Dr. Oneida Alar, negative h pylori  . GERD (gastroesophageal reflux disease)   . Gout   . Hypertension x 6 years   . Hypothyroidism   . Migraines    "15/month average" (09/28/2013)  . Spinal stenosis   . Thyroid goiter     Past Surgical History:  Procedure Laterality Date  . BIOPSY  10/20/2017   Procedure: BIOPSY;  Surgeon: Barney Drain  L, MD;  Location: [facility];  Service: Endoscopy;;  gastric duodenal esophagus  . CESAREAN SECTION  2008  . CHONDROPLASTY  04/02/2012   Procedure: CHONDROPLASTY;  Surgeon: Carole Civil, MD;  Location: [facility];  Service: Orthopedics;  Laterality: Left;  of left patella  . COLONOSCOPY  May 2012   Dr. Oneida Alar: normal colon, small internal hemorrhoids  . COLONOSCOPY WITH PROPOFOL N/A 10/22/2017   Procedure: COLONOSCOPY WITH PROPOFOL;  Surgeon: Danie Binder, MD;  Location: [facility];  Service:  Endoscopy;  Laterality: N/A;  7:30am  . COLONOSCOPY WITH PROPOFOL N/A 10/20/2017   Procedure: COLONOSCOPY WITH PROPOFOL;  Surgeon: Danie Binder, MD;  Location: [facility];  Service: Endoscopy;  Laterality: N/A;  11:00am  . ESOPHAGOGASTRODUODENOSCOPY (EGD) WITH PROPOFOL N/A 10/20/2017   Procedure: ESOPHAGOGASTRODUODENOSCOPY (EGD) WITH PROPOFOL;  Surgeon: Danie Binder, MD;  Location: [facility];  Service: Endoscopy;  Laterality: N/A;  . INCISION AND DRAINAGE ABSCESS  1997; 2005   "under right buttocks; under left arm"  . SAVORY DILATION N/A 10/20/2017   Procedure: SAVORY DILATION;  Surgeon: Danie Binder, MD;  Location: [facility];  Service: Endoscopy;  Laterality: N/A;  . THYROIDECTOMY N/A 09/28/2013   Procedure: TOTAL THYROIDECTOMY;  Surgeon: Ascencion Dike, MD;  Location: [facility];  Service: ENT;  Laterality: N/A;  . TOTAL THYROIDECTOMY  09/27/2013  . UPPER GI ENDOSCOPY  2011   EGD: gastritis, no h.pylori    Current Outpatient Medications  Medication Sig Dispense Refill  . amLODipine (NORVASC) 10 MG tablet Take 1 tablet (10 mg total) by mouth daily. 30 tablet 3  . buPROPion (WELLBUTRIN XL) 300 MG 24 hr tablet Take 1 tablet (300 mg total) by mouth daily. 30 tablet 2  . fluticasone (FLONASE) 50 MCG/ACT nasal spray Place 1 spray into both nostrils daily as needed for allergies or rhinitis.    Marland Kitchen levothyroxine (SYNTHROID) 200 MCG tablet Take one tablet by mouth every morning, 1 hour before breakfast, on Monday , Wednesday and  Friday Take one and a half tablets by mouth every morning , 1 hour before breakfast, on Tuesday, Thursday, Saturday and Sunday 36 tablet 3  . medroxyPROGESTERone (DEPO-PROVERA) 150 MG/ML injection Inject 150 mg into the muscle every 3 (three) months.    . metoprolol tartrate (LOPRESSOR) 50 MG tablet TAKE 1 TABLET BY MOUTH TWICE DAILY 180 tablet 1  . pantoprazole (PROTONIX) 40 MG tablet Take 1 tablet (40 mg total) by mouth 2 (two) times daily before a meal. 180 tablet 3    . polyethylene glycol (MIRALAX / GLYCOLAX) 17 g packet Take 17 g by mouth daily.    . pregabalin (LYRICA) 100 MG capsule Take 100 mg by mouth 3 (three) times daily.    . traMADol (ULTRAM) 50 MG tablet Take by mouth every 12 (twelve) hours as needed.    . lubiprostone (AMITIZA) 24 MCG capsule Take 1 capsule (24 mcg total) by mouth 2 (two) times daily with a meal. 180 capsule 3   No current facility-administered medications for this visit.    Allergies as of 02/14/2020 - Review Complete 02/14/2020  Allergen Reaction Noted  . Codeine Itching   . Ibuprofen Other (See Comments) 09/02/2016  . Soybean-containing drug products Other (See Comments) 12/10/2010    Family History  Problem Relation Age of Onset  . Arthritis Mother   . Migraines Mother   . Hypertension Mother   . Cancer Maternal Grandmother  lung  . Asthma Other        family history   . Colon cancer Neg Hx     Social History   Socioeconomic History  . Marital status: Legally Separated    Spouse name: Not on file  . Number of children: 1  . Years of education: 23  . Highest education level: 12th grade  Occupational History  . Occupation: Hospital doctor: UNEMPLOYED  Tobacco Use  . Smoking status: Former Smoker    Packs/day: 1.00    Years: 5.00    Pack years: 5.00    Quit date: 09/11/2010    Years since quitting: 9.4  . Smokeless tobacco: Never Used  . Tobacco comment: QUIT SMOKING X 3 YEARS AGO,was vaping but has since quit completely  Vaping Use  . Vaping Use: Every day  Substance and Sexual Activity  . Alcohol use: No  . Drug use: No  . Sexual activity: Not Currently    Partners: Male    Birth control/protection: Other-see comments, Injection    Comment: Depo  Other Topics Concern  . Not on file  Social History Narrative   Pt is currently on medicaid. From [place] and came to [facility] in mid 2010   Is separated from Husband, lives with Mother right now     Social Determinants of Health   Financial Resource Strain:   . Difficulty of Paying Living Expenses:   Food Insecurity:   . Worried About Charity fundraiser in the Last Year:   . Arboriculturist in the Last Year:   Transportation Needs:   . Film/video editor (Medical):   Marland Kitchen Lack of Transportation (Non-Medical):   Physical Activity:   . Days of Exercise per Week:   . Minutes of Exercise per Session:   Stress:   . Feeling of Stress :   Social Connections:   . Frequency of Communication with Friends and Family:   . Frequency of Social Gatherings with Friends and Family:   . Attends Religious Services:   . Active Member of Clubs or Organizations:   . Attends Archivist Meetings:   Marland Kitchen Marital Status:     Subjective: Review of Systems  Constitutional: Negative for chills, fever, malaise/fatigue and weight loss.  HENT: Negative for congestion and sore throat.   Respiratory: Negative for cough and shortness of breath.   Cardiovascular: Negative for chest pain and palpitations.  Gastrointestinal: Negative for abdominal pain, blood in stool, diarrhea, melena, nausea and vomiting.  Musculoskeletal: Negative for joint pain and myalgias.  Skin: Negative for rash.  Neurological: Negative for dizziness and weakness.  Endo/Heme/Allergies: Does not bruise/bleed easily.  Psychiatric/Behavioral: Negative for depression. The patient is not nervous/anxious.   All other systems reviewed and are negative.    Objective: BP 134/88   Pulse (!) 103   Temp (!) 96.8 F (36 C) (Oral)   Ht 5\' 3"  (1.6 m)   Wt 195 lb 12.8 oz (88.8 kg)   BMI 34.68 kg/m  Physical Exam Vitals and nursing note reviewed.  Constitutional:      General: She is not in acute distress.    Appearance: Normal appearance. She is well-developed. She is not ill-appearing, toxic-appearing or diaphoretic.  HENT:     Head: Normocephalic and atraumatic.     Nose: No congestion or rhinorrhea.  Eyes:      General: No scleral icterus. Cardiovascular:     Rate and Rhythm:  Normal rate and regular rhythm.     Heart sounds: Normal heart sounds.  Pulmonary:     Effort: Pulmonary effort is normal. No respiratory distress.     Breath sounds: Normal breath sounds.  Abdominal:     General: Bowel sounds are normal.     Palpations: Abdomen is soft. There is no hepatomegaly, splenomegaly or mass.     Tenderness: There is no abdominal tenderness. There is no guarding or rebound.     Hernia: No hernia is present.  Skin:    General: Skin is warm and dry.     Coloration: Skin is not jaundiced.     Findings: No rash.  Neurological:     General: No focal deficit present.     Mental Status: She is alert and oriented to person, place, and time.  Psychiatric:        Attention and Perception: Attention normal.        Mood and Affect: Mood normal.        Speech: Speech normal.        Behavior: Behavior normal.        Thought Content: Thought content normal.        Cognition and Memory: Cognition and memory normal.       02/14/2020 2:12 PM   Disclaimer: This note was dictated with voice recognition software. Similar sounding words can inadvertently be transcribed and may not be corrected upon review.

## 2020-02-14 NOTE — Assessment & Plan Note (Signed)
The patient is having significant constipation. She ran out of Amitiza and thought that there was a refill at her pharmacy, but there was not. Unfortunately she did not call us to let us know for [place] to refill it. In the meantime she has been trying over-the-counter options such as MiraLAX to help with her constipation but this is not been as effective. She does have some hemorrhoid symptoms which have been flaring recently including some intermittent, mild hematochezia. Colonoscopy is up-to-date and reassuring. I feel that she is likely having benign anorectal bleeding from her known hemorrhoids in the setting of worsening constipation. I refilled her Amitiza for 1 year. Recommend she restart her Amitiza, consider use MiraLAX as needed over-the-counter for breakthrough symptoms. Call for any worsening or unresolved symptoms. Follow-up in 1 year

## 2020-02-14 NOTE — Assessment & Plan Note (Signed)
GERD symptoms generally doing well on Protonix. Recommend she continue her medication and let us know for any worsening symptoms. Otherwise follow-up in 1 year.

## 2020-02-14 NOTE — Patient Instructions (Signed)
Your health issues we discussed today were:   GERD (reflux/heartburn): 1. I am glad Protonix is working well for you 2. Continue to take your Protonix as you have been 3. Call us if you have any worsening or severe symptoms  Constipation: 1. I have sent a refill of Amitiza to your pharmacy 2. To restart taking Amitiza. You can still use MiraLAX as needed for "breakthrough" constipation 3. Controlling your constipation should help with your hemorrhoid issues  Hemorrhoids: 1. These are likely flaring up and causing "fullness" and discomfort because of your constipation 2. When you restart your Amitiza and your constipation improves your hemorrhoids should improve as well 3. Continue to use Anusol rectal cream as needed for hemorrhoid flares 4. Call us if you have any worsening or severe symptoms.  Overall I recommend:  1. Continue your other current medications 2. Return for follow-up in 1 year 3. Call us if any questions or concerns   ---------------------------------------------------------------  I am glad you have gotten your first COVID-19 vaccination! Keep your appointment for your second dose. Even when you are fully vaccinated you should continue to follow CDC and state/local guidelines.  ---------------------------------------------------------------   At [facility] we value your feedback. You may receive a survey about your visit today. Please share your experience as we strive to create trusting relationships with our patients to provide genuine, compassionate, quality care.  We appreciate your understanding and patience as we review any laboratory studies, imaging, and other diagnostic tests that are ordered as we care for you. Our office policy is 5 business days for review of these results, and any emergent or urgent results are addressed in a timely manner for your best interest. If you do not hear from our office in 1 week, please contact us.   We  also encourage the use of MyChart, which contains your medical information for your review as well. If you are not enrolled in this feature, an access code is on this after visit summary for your convenience. Thank you for allowing [place] to be involved in your care.  It was great to see you today!  I hope you have a great Summer!!

## 2020-02-14 NOTE — Assessment & Plan Note (Signed)
Not surprisingly, she is having some flare of her hemorrhoid symptoms with her uncontrolled constipation because of running out of Amitiza. I refilled her Amitiza for her and when her constipation improves her hemorrhoid should improve as well. She does have Anusol rectal cream at home and I recommended she use this as needed for any hemorrhoid symptoms. Monitor and let us know if any worsening or unusual symptoms. Follow-up in 1 year otherwise.

## 2020-03-13 DIAGNOSIS — M130 Polyarthritis, unspecified: Secondary | ICD-10-CM | POA: Diagnosis not present

## 2020-03-13 DIAGNOSIS — Z79891 Long term (current) use of opiate analgesic: Secondary | ICD-10-CM | POA: Diagnosis not present

## 2020-03-13 DIAGNOSIS — I10 Essential (primary) hypertension: Secondary | ICD-10-CM | POA: Diagnosis not present

## 2020-03-13 DIAGNOSIS — M545 Low back pain: Secondary | ICD-10-CM | POA: Diagnosis not present

## 2020-03-13 DIAGNOSIS — M25569 Pain in unspecified knee: Secondary | ICD-10-CM | POA: Diagnosis not present

## 2020-03-18 ENCOUNTER — Other Ambulatory Visit: Payer: Self-pay

## 2020-03-18 ENCOUNTER — Emergency Department (HOSPITAL_COMMUNITY): Payer: Medicare Other

## 2020-03-18 ENCOUNTER — Encounter (HOSPITAL_COMMUNITY): Payer: Self-pay

## 2020-03-18 DIAGNOSIS — X58XXXA Exposure to other specified factors, initial encounter: Secondary | ICD-10-CM | POA: Insufficient documentation

## 2020-03-18 DIAGNOSIS — Y998 Other external cause status: Secondary | ICD-10-CM | POA: Insufficient documentation

## 2020-03-18 DIAGNOSIS — Z79899 Other long term (current) drug therapy: Secondary | ICD-10-CM | POA: Insufficient documentation

## 2020-03-18 DIAGNOSIS — Y9289 Other specified places as the place of occurrence of the external cause: Secondary | ICD-10-CM | POA: Diagnosis not present

## 2020-03-18 DIAGNOSIS — Y9389 Activity, other specified: Secondary | ICD-10-CM | POA: Diagnosis not present

## 2020-03-18 DIAGNOSIS — M25562 Pain in left knee: Secondary | ICD-10-CM | POA: Diagnosis not present

## 2020-03-18 DIAGNOSIS — S83005A Unspecified dislocation of left patella, initial encounter: Secondary | ICD-10-CM | POA: Diagnosis not present

## 2020-03-18 DIAGNOSIS — Z87891 Personal history of nicotine dependence: Secondary | ICD-10-CM | POA: Diagnosis not present

## 2020-03-18 DIAGNOSIS — E039 Hypothyroidism, unspecified: Secondary | ICD-10-CM | POA: Insufficient documentation

## 2020-03-18 DIAGNOSIS — I10 Essential (primary) hypertension: Secondary | ICD-10-CM | POA: Diagnosis not present

## 2020-03-18 NOTE — ED Triage Notes (Signed)
Pt arrives from home via POV c/o left knee pain. Pt reports having previous surgery on left knee and was told by a doctor that it was a failed surgery. Pt reports attempting to take a step earlier today and left knee shifted out of place and when Pt fell to ground, the knee shifted back. t presents with knee brace in place from home and was observed ambulating In waiting room.

## 2020-03-19 ENCOUNTER — Emergency Department (HOSPITAL_COMMUNITY)
Admission: EM | Admit: 2020-03-19 | Discharge: 2020-03-19 | Disposition: A | Discharge status: Home / Self Care | Payer: Medicare Other | Attending: Emergency Medicine | Admitting: Emergency Medicine

## 2020-03-19 DIAGNOSIS — S83005A Unspecified dislocation of left patella, initial encounter: Secondary | ICD-10-CM

## 2020-03-19 NOTE — ED Provider Notes (Signed)
[facility] EMERGENCY DEPARTMENT Provider Note   CSN: [id number] Arrival date & time: 03/18/20  1804     History Chief Complaint  Patient presents with   Knee Pain    Martha Montgomery is a 39 y.o. female.  HPI Patient presents after recurrent left patellar dislocation.  States she has had previous surgery that was "failed".  Had previous patellar dislocation and has seen both Dr. Aline Brochure and then seen another surgeon at [facility] and stated she is going to need a bigger surgery she was not eager to get done.  States she was trying to take a step and she felt her knee pop.  States the patella came medially.  States it went back to where it was supposed to be after she fell.  Did hit her head.  No loss conscious.  Mild headache at this time.  Not on anticoagulation.  She presents wearing a brace that does allow bending of the knee and she states was for the contralateral side.    Past Medical History:  Diagnosis Date   Anxiety    Anxiety disorder    Arthritis    "knees, joints, hands, toes" (09/28/2013)   Depression    Depression, major, single episode, in partial remission ([place]) 10/18/2008   Qualifier: Diagnosis of  By: Moshe Cipro MD, Margaret  PHQ 9 score of 7 in 10/2017, not suicidal or homicidal, interested in telepsych however, score of 16 in  02/2018, not suicidal or homicidal, start medication and refer to telepsych Score of 7 in 07/2018    Fibromyalgia    Gastritis nov. 2011   EGD by Dr. Oneida Alar, negative h pylori   GERD (gastroesophageal reflux disease)    Gout    Hypertension x 6 years    Hypothyroidism    Migraines    "15/month average" (09/28/2013)   Spinal stenosis    Thyroid goiter     Patient Active Problem List   Diagnosis Date Noted   Depression, major, single episode, moderate ([place]) 01/01/2020   Dental caries 04/24/2019   Morbid obesity ([place]) 01/08/2019   Multiple benign lumps of breast 08/17/2018   Hemorrhoids 05/19/2018   Encounter for  contraceptive management 03/20/2018   Low vitamin D level 11/30/2017   Esophageal dysphagia 09/09/2017   Bloating 03/05/2017   Bilateral knee pain 01/05/2017   Amenorrhea 01/05/2017   Bilateral hip pain 07/22/2016   Post-surgical hypothyroidism 06/02/2014   HTN (hypertension), benign 06/30/2012   Knee pain 06/02/2012   Patellar malalignment syndrome 03/09/2012   IBS (irritable bowel syndrome) 08/29/2011   GERD (gastroesophageal reflux disease) 08/29/2011   Rectal bleeding 12/10/2010   Back pain 09/24/2010   Migraine headache 04/02/2010   Constipation 09/12/2009   Allergic rhinitis 06/16/2009    Past Surgical History:  Procedure Laterality Date   BIOPSY  10/20/2017   Procedure: BIOPSY;  Surgeon: Danie Binder, MD;  Location: [facility];  Service: Endoscopy;;  gastric duodenal esophagus   CESAREAN SECTION  2008   CHONDROPLASTY  04/02/2012   Procedure: CHONDROPLASTY;  Surgeon: Carole Civil, MD;  Location: [facility] ORS;  Service: Orthopedics;  Laterality: Left;  of left patella   COLONOSCOPY  May 2012   Dr. Oneida Alar: normal colon, small internal hemorrhoids   COLONOSCOPY WITH PROPOFOL N/A 10/22/2017   Procedure: COLONOSCOPY WITH PROPOFOL;  Surgeon: Danie Binder, MD;  Location: [facility];  Service: Endoscopy;  Laterality: N/A;  7:30am   COLONOSCOPY WITH PROPOFOL N/A 10/20/2017   Procedure: COLONOSCOPY WITH PROPOFOL;  Surgeon: Danie Binder, MD;  Location: [facility];  Service: Endoscopy;  Laterality: N/A;  11:00am   ESOPHAGOGASTRODUODENOSCOPY (EGD) WITH PROPOFOL N/A 10/20/2017   Procedure: ESOPHAGOGASTRODUODENOSCOPY (EGD) WITH PROPOFOL;  Surgeon: Danie Binder, MD;  Location: [facility];  Service: Endoscopy;  Laterality: N/A;   INCISION AND DRAINAGE ABSCESS  1997; 2005   "under right buttocks; under left arm"   SAVORY DILATION N/A 10/20/2017   Procedure: SAVORY DILATION;  Surgeon: Danie Binder, MD;  Location: [facility];  Service:  Endoscopy;  Laterality: N/A;   THYROIDECTOMY N/A 09/28/2013   Procedure: TOTAL THYROIDECTOMY;  Surgeon: Ascencion Dike, MD;  Location: [facility] OR;  Service: ENT;  Laterality: N/A;   TOTAL THYROIDECTOMY  09/27/2013   UPPER GI ENDOSCOPY  2011   EGD: gastritis, no h.pylori     OB History    Gravida  1   Para  1   Term      Preterm      AB      Living  1     SAB      TAB      Ectopic      Multiple      Live Births              Family History  Problem Relation Age of Onset   Arthritis Mother    Migraines Mother    Hypertension Mother    Cancer Maternal Grandmother        lung   Asthma Other        family history    Colon cancer Neg Hx     Social History   Tobacco Use   Smoking status: Former Smoker    Packs/day: 1.00    Years: 5.00    Pack years: 5.00    Quit date: 09/11/2010    Years since quitting: 9.5   Smokeless tobacco: Never Used   Tobacco comment: QUIT SMOKING X 3 YEARS AGO,was vaping but has since quit completely  Vaping Use   Vaping Use: Every day  Substance Use Topics   Alcohol use: No   Drug use: No    Home Medications Prior to Admission medications   Medication Sig Start Date End Date Taking? Authorizing Provider  amLODipine (NORVASC) 10 MG tablet Take 1 tablet (10 mg total) by mouth daily. 12/29/19   Fayrene Helper, MD  buPROPion (WELLBUTRIN XL) 300 MG 24 hr tablet Take 1 tablet (300 mg total) by mouth daily. 01/09/20   Fayrene Helper, MD  fluticasone (FLONASE) 50 MCG/ACT nasal spray Place 1 spray into both nostrils daily as needed for allergies or rhinitis.    [provider]  levothyroxine (SYNTHROID) 200 MCG tablet Take one tablet by mouth every morning, 1 hour before breakfast, on Monday , Wednesday and  Friday Take one and a half tablets by mouth every morning , 1 hour before breakfast, on Tuesday, Thursday, Saturday and Sunday 11/17/19   Fayrene Helper, MD  lubiprostone (AMITIZA) 24 MCG capsule Take 1 capsule  (24 mcg total) by mouth 2 (two) times daily with a meal. 02/14/20   Carlis Stable, NP  medroxyPROGESTERone (DEPO-PROVERA) 150 MG/ML injection Inject 150 mg into the muscle every 3 (three) months.    [provider]  metoprolol tartrate (LOPRESSOR) 50 MG tablet TAKE 1 TABLET BY MOUTH TWICE DAILY 03/04/19   Fayrene Helper, MD  pantoprazole (PROTONIX) 40 MG tablet Take 1 tablet (40 mg total) by  mouth 2 (two) times daily before a meal. 08/16/19   Carlis Stable, NP  polyethylene glycol (MIRALAX / GLYCOLAX) 17 g packet Take 17 g by mouth daily.    [provider]  pregabalin (LYRICA) 100 MG capsule Take 100 mg by mouth 3 (three) times daily.    [provider]  traMADol (ULTRAM) 50 MG tablet Take by mouth every 12 (twelve) hours as needed.    [provider]    Allergies    Codeine, Ibuprofen, and Soybean-containing drug products  Review of Systems   Review of Systems  Constitutional: Negative for appetite change.  Respiratory: Negative for shortness of breath.   Gastrointestinal: Negative for abdominal pain.  Musculoskeletal:       Left knee pain.  Skin: Negative for rash.  Neurological: Negative for weakness and numbness.  Psychiatric/Behavioral: Negative for confusion.    Physical Exam Updated Vital Signs BP (!) 142/83 (BP Location: Right Arm)    Pulse (!) 117    Temp 99.1 F (37.3 C) (Oral)    Resp 18    Ht 5\' 3"  (1.6 m)    Wt 88.5 kg    SpO2 97%    BMI 34.54 kg/m   Physical Exam Vitals and nursing note reviewed.  HENT:     Head:     Comments: Mild tenderness on left occipital area.  No deformity. Chest:     Chest wall: No tenderness.  Abdominal:     Tenderness: There is no abdominal tenderness.  Musculoskeletal:     Cervical back: Neck supple. No tenderness.     Comments: Mild tenderness left anterior knee.  Patella somewhat lax with medial lateral movement.  No knee effusion.  Knee appears grossly stable.  Skin:    Capillary Refill:  Capillary refill takes less than 2 seconds.  Neurological:     Mental Status: She is alert.     ED Results / Procedures / Treatments   Labs (all labs ordered are listed, but only abnormal results are displayed) Labs Reviewed - No data to display  EKG None  Radiology DG Knee Complete 4 Views Left  Result Date: 03/18/2020 CLINICAL DATA:  Left knee pain, fall EXAM: LEFT KNEE - COMPLETE 4+ VIEW COMPARISON:  None. FINDINGS: No evidence of fracture, dislocation, or joint effusion. No evidence of arthropathy or other focal bone abnormality. Soft tissues are unremarkable. IMPRESSION: Negative. Electronically Signed   By: Fidela Salisbury MD   On: 03/18/2020 20:40    Procedures Procedures (including critical care time)  Medications Ordered in ED Medications - No data to display  ED Course  I have reviewed the triage vital signs and the nursing notes.  Pertinent labs & imaging results that were available during my care of the patient were reviewed by me and considered in my medical decision making (see chart for details).    MDM Rules/Calculators/A&P                          Patient with likely dislocation of patella.  Spontaneously reduced prior to coming to the ER.  Feeling somewhat better.  Will give knee immobilizer.  Will have follow-up with Dr. Aline Brochure but may also need to follow-up with her other orthopedic surgeon.  Did hit head but no loss conscious.  Had an equivalent of a monitoring period in the time she is waiting to be seen in the ER.  Do not think she needs imaging of her head  at this time.  Will discharge home. Final Clinical Impression(s) / ED Diagnoses Final diagnoses:  Patellar dislocation, left, initial encounter    Rx / DC Orders ED Discharge Orders    None       Davonna Belling, MD 03/19/20 [phone]

## 2020-03-22 DIAGNOSIS — M25362 Other instability, left knee: Secondary | ICD-10-CM | POA: Diagnosis not present

## 2020-03-22 DIAGNOSIS — M2202 Recurrent dislocation of patella, left knee: Secondary | ICD-10-CM | POA: Diagnosis not present

## 2020-03-22 DIAGNOSIS — M25562 Pain in left knee: Secondary | ICD-10-CM | POA: Diagnosis not present

## 2020-03-22 DIAGNOSIS — M25361 Other instability, right knee: Secondary | ICD-10-CM | POA: Diagnosis not present

## 2020-03-22 DIAGNOSIS — M25561 Pain in right knee: Secondary | ICD-10-CM | POA: Diagnosis not present

## 2020-03-27 ENCOUNTER — Ambulatory Visit (INDEPENDENT_AMBULATORY_CARE_PROVIDER_SITE_OTHER): Payer: Medicare Other | Admitting: Family Medicine

## 2020-03-27 ENCOUNTER — Encounter: Payer: Self-pay | Admitting: Family Medicine

## 2020-03-27 ENCOUNTER — Other Ambulatory Visit: Payer: Self-pay

## 2020-03-27 VITALS — BP 148/83 | HR 97 | Resp 16 | Ht 63.0 in | Wt 189.0 lb

## 2020-03-27 DIAGNOSIS — Z23 Encounter for immunization: Secondary | ICD-10-CM

## 2020-03-27 DIAGNOSIS — G43709 Chronic migraine without aura, not intractable, without status migrainosus: Secondary | ICD-10-CM | POA: Diagnosis not present

## 2020-03-27 DIAGNOSIS — E669 Obesity, unspecified: Secondary | ICD-10-CM | POA: Diagnosis not present

## 2020-03-27 DIAGNOSIS — F321 Major depressive disorder, single episode, moderate: Secondary | ICD-10-CM | POA: Diagnosis not present

## 2020-03-27 DIAGNOSIS — I10 Essential (primary) hypertension: Secondary | ICD-10-CM | POA: Diagnosis not present

## 2020-03-27 MED ORDER — SERTRALINE HCL 50 MG PO TABS: 50.0000 mg | ORAL_TABLET | Freq: Every day | ORAL | 3 refills | Status: DC

## 2020-03-27 MED ORDER — TRIAMTERENE-HCTZ 37.5-25 MG PO TABS: 1.0000 | ORAL_TABLET | Freq: Every day | ORAL | 3 refills | Status: DC

## 2020-03-27 NOTE — Patient Instructions (Addendum)
F/U in 8 weeks, call if you need me before  New additional medication for blood pressure is triamterene one daily, do not take HCTZ if you have this as this is already in triamterene  NEED to bring meds taking to every visit  New for depression is Zoloft 50 mg daily and you are referred to Dr Michail Sermon  Flu vaccine today  Fasting lipid cmp and EGFr 4 days beforwe follow up  CONGRATS on changes  You are making , all the best!

## 2020-03-28 ENCOUNTER — Other Ambulatory Visit: Payer: Self-pay

## 2020-03-28 ENCOUNTER — Encounter: Payer: Self-pay | Admitting: Family Medicine

## 2020-03-28 MED ORDER — SERTRALINE HCL 50 MG PO TABS
50.0000 mg | ORAL_TABLET | Freq: Every day | ORAL | 3 refills | Status: DC
Start: 2020-03-28 — End: 2020-05-22

## 2020-03-30 ENCOUNTER — Encounter: Payer: Self-pay | Admitting: Family Medicine

## 2020-03-30 NOTE — Assessment & Plan Note (Signed)
Uncontrolled , not at goal, start triamterene continue amlodipine DASH diet and commitment to daily physical activity for a minimum of 30 minutes discussed and encouraged, as a part of hypertension management. The importance of attaining a healthy weight is also discussed.  BP/Weight 03/27/2020 03/19/2020 03/18/2020 02/14/2020 12/29/2019 10/01/2900 07/28/1550  Systolic BP 080 223 - 361 224 497 530  Diastolic BP 83 80 - 88 051 86 95  Wt. (Lbs) 189 - 195 195.8 200 204 208  BMI 33.48 34.54 - 34.68 35.43 36.14 36.85

## 2020-03-30 NOTE — Assessment & Plan Note (Signed)
Controlled, no change in medication  

## 2020-03-30 NOTE — Assessment & Plan Note (Signed)
Add  Zoloft, refer to therapist

## 2020-03-30 NOTE — Progress Notes (Signed)
   Martha Montgomery     MRN: [id number]      DOB: 1981-07-17   HPI Martha Montgomery is here for follow up and re-evaluation of chronic medical conditions, medication management and review of any available recent lab and radiology data.  Preventive health is updated, specifically  Cancer screening and Immunization.   Questions or concerns regarding consultations or procedures which the PT has had in the interim are  addressed. The PT denies any adverse reactions to current medications since the last visit.  There are no new concerns.  There are no specific complaints   ROS Denies recent fever or chills. Denies sinus pressure, nasal congestion, ear pain or sore throat. Denies chest congestion, productive cough or wheezing. Denies chest pains, palpitations and leg swelling Denies abdominal pain, nausea, vomiting,diarrhea or constipation.   Denies dysuria, frequency, hesitancy or incontinence. Denies joint pain, swelling and limitation in mobility. Denies headaches, seizures, numbness, or tingling. C/o uncontrolled  Depression,reuquests med adjustment and therapy, anticipates worsening mood in the inter, denies uncontrolled  anxiety or insomnia. Denies skin break down or rash.   PE  BP (!) 148/83   Pulse 97   Resp 16   Ht 5\' 3"  (1.6 m)   Wt 189 lb (85.7 kg)   SpO2 95%   BMI 33.48 kg/m   Patient alert and oriented and in no cardiopulmonary distress.  HEENT: No facial asymmetry, EOMI,     Neck supple .  Chest: Clear to auscultation bilaterally.  CVS: S1, S2 no murmurs, no S3.Regular rate.  ABD: Soft non tender.   Ext: No edema  MS: Adequate ROM spine, shoulders, hips and knees.  Skin: Intact, no ulcerations or rash noted.  Psych: Good eye contact, normal affect. Memory intact not anxious or depressed appearing.  CNS: CN 2-12 intact, power,  normal throughout.no focal deficits noted.   Assessment & Plan  Depression, major, single episode, moderate (HCC) Add  Zoloft, refer to  therapist  Obesity (BMI 30.0-34.9)  Patient re-educated about  the importance of commitment to a  minimum of 150 minutes of exercise per week as able.  The importance of healthy food choices with portion control discussed, as well as eating regularly and within a 12 hour window most days. The need to choose "clean , green" food 50 to 75% of the time is discussed, as well as to make water the primary drink and set a goal of 64 ounces water daily.    Weight /BMI 03/27/2020 03/19/2020 03/18/2020  WEIGHT 189 lb - 195 lb  HEIGHT 5\' 3"  - 5\' 3"   BMI 33.48 kg/m2 34.54 kg/m2 -      HTN (hypertension), benign Uncontrolled , not at goal, start triamterene continue amlodipine DASH diet and commitment to daily physical activity for a minimum of 30 minutes discussed and encouraged, as a part of hypertension management. The importance of attaining a healthy weight is also discussed.  BP/Weight 03/27/2020 03/19/2020 03/18/2020 02/14/2020 12/29/2019 09/03/7822 08/31/5359  Systolic BP 443 154 - 008 676 195 093  Diastolic BP 83 80 - 88 267 86 95  Wt. (Lbs) 189 - 195 195.8 200 204 208  BMI 33.48 34.54 - 34.68 35.43 36.14 36.85       Migraine headache Controlled, no change in medication

## 2020-03-30 NOTE — Assessment & Plan Note (Signed)
  Patient re-educated about  the importance of commitment to a  minimum of 150 minutes of exercise per week as able.  The importance of healthy food choices with portion control discussed, as well as eating regularly and within a 12 hour window most days. The need to choose "clean , green" food 50 to 75% of the time is discussed, as well as to make water the primary drink and set a goal of 64 ounces water daily.    Weight /BMI 03/27/2020 03/19/2020 03/18/2020  WEIGHT 189 lb - 195 lb  HEIGHT 5\' 3"  - 5\' 3"   BMI 33.48 kg/m2 34.54 kg/m2 -

## 2020-04-09 ENCOUNTER — Other Ambulatory Visit: Payer: Self-pay | Admitting: Family Medicine

## 2020-04-12 DIAGNOSIS — M25362 Other instability, left knee: Secondary | ICD-10-CM | POA: Diagnosis not present

## 2020-04-26 ENCOUNTER — Other Ambulatory Visit: Payer: Self-pay | Admitting: Family Medicine

## 2020-05-08 DIAGNOSIS — M130 Polyarthritis, unspecified: Secondary | ICD-10-CM | POA: Diagnosis not present

## 2020-05-08 DIAGNOSIS — M25569 Pain in unspecified knee: Secondary | ICD-10-CM | POA: Diagnosis not present

## 2020-05-08 DIAGNOSIS — M797 Fibromyalgia: Secondary | ICD-10-CM | POA: Diagnosis not present

## 2020-05-08 DIAGNOSIS — I10 Essential (primary) hypertension: Secondary | ICD-10-CM | POA: Diagnosis not present

## 2020-05-08 DIAGNOSIS — Z79891 Long term (current) use of opiate analgesic: Secondary | ICD-10-CM | POA: Diagnosis not present

## 2020-05-21 DIAGNOSIS — M2202 Recurrent dislocation of patella, left knee: Secondary | ICD-10-CM | POA: Diagnosis not present

## 2020-05-21 DIAGNOSIS — M2242 Chondromalacia patellae, left knee: Secondary | ICD-10-CM | POA: Diagnosis not present

## 2020-05-22 ENCOUNTER — Ambulatory Visit (INDEPENDENT_AMBULATORY_CARE_PROVIDER_SITE_OTHER): Payer: Medicare Other | Admitting: Family Medicine

## 2020-05-22 ENCOUNTER — Encounter: Payer: Self-pay | Admitting: Family Medicine

## 2020-05-22 ENCOUNTER — Other Ambulatory Visit: Payer: Self-pay

## 2020-05-22 VITALS — BP 147/96 | HR 103 | Resp 15 | Ht 63.0 in | Wt 188.0 lb

## 2020-05-22 DIAGNOSIS — I10 Essential (primary) hypertension: Secondary | ICD-10-CM | POA: Diagnosis not present

## 2020-05-22 DIAGNOSIS — F324 Major depressive disorder, single episode, in partial remission: Secondary | ICD-10-CM | POA: Diagnosis not present

## 2020-05-22 DIAGNOSIS — E890 Postprocedural hypothyroidism: Secondary | ICD-10-CM

## 2020-05-22 DIAGNOSIS — Z30019 Encounter for initial prescription of contraceptives, unspecified: Secondary | ICD-10-CM | POA: Diagnosis not present

## 2020-05-22 DIAGNOSIS — Z308 Encounter for other contraceptive management: Secondary | ICD-10-CM | POA: Diagnosis not present

## 2020-05-22 DIAGNOSIS — Z1159 Encounter for screening for other viral diseases: Secondary | ICD-10-CM

## 2020-05-22 DIAGNOSIS — E669 Obesity, unspecified: Secondary | ICD-10-CM

## 2020-05-22 LAB — POCT URINE PREGNANCY: Preg Test, Ur: NEGATIVE

## 2020-05-22 MED ORDER — TRIAMTERENE-HCTZ 75-50 MG PO TABS: 1.0000 | ORAL_TABLET | Freq: Every day | ORAL | 3 refills | Status: DC

## 2020-05-22 MED ORDER — MEDROXYPROGESTERONE ACETATE 150 MG/ML IM SUSP
150.0000 mg | Freq: Once | INTRAMUSCULAR | Status: AC
  Administered 2020-05-22: 150 mg via INTRAMUSCULAR

## 2020-05-22 NOTE — Patient Instructions (Addendum)
Follow-up in office with MD first week in January call if you need me sooner.  Dose increase in triamterene 75/50, one  tablet once daily please start today.  You are referred to therapy you will be called from that office.  Depo-Provera to be administered today if due   Labs today lipid TSH Chem-7 and EGFR and hepatitis C  Congrats on weight loss , keep it up!   All the best with knee surgery tomorrow  Think about what you will eat, plan ahead. Choose " clean, green, fresh or frozen" over canned, processed or packaged foods which are more sugary, salty and fatty. 70 to 75% of food eaten should be vegetables and fruit. Three meals at set times with snacks allowed between meals, but they must be fruit or vegetables. Aim to eat over a 12 hour period , example 7 am to 7 pm, and STOP after  your last meal of the day. Drink water,generally about 64 ounces per day, no other drink is as healthy. Fruit juice is best enjoyed in a healthy way, by EATING the fruit.   Thanks for choosing [facility], we consider it a privelige to serve you.

## 2020-05-23 DIAGNOSIS — M25561 Pain in right knee: Secondary | ICD-10-CM | POA: Diagnosis not present

## 2020-05-23 DIAGNOSIS — M65862 Other synovitis and tenosynovitis, left lower leg: Secondary | ICD-10-CM | POA: Diagnosis not present

## 2020-05-23 DIAGNOSIS — E559 Vitamin D deficiency, unspecified: Secondary | ICD-10-CM | POA: Diagnosis not present

## 2020-05-23 DIAGNOSIS — M25361 Other instability, right knee: Secondary | ICD-10-CM | POA: Diagnosis not present

## 2020-05-23 DIAGNOSIS — I10 Essential (primary) hypertension: Secondary | ICD-10-CM | POA: Diagnosis not present

## 2020-05-23 DIAGNOSIS — M2242 Chondromalacia patellae, left knee: Secondary | ICD-10-CM | POA: Diagnosis not present

## 2020-05-23 DIAGNOSIS — Z87891 Personal history of nicotine dependence: Secondary | ICD-10-CM | POA: Diagnosis not present

## 2020-05-23 DIAGNOSIS — M25562 Pain in left knee: Secondary | ICD-10-CM | POA: Diagnosis not present

## 2020-05-23 DIAGNOSIS — M25362 Other instability, left knee: Secondary | ICD-10-CM | POA: Diagnosis not present

## 2020-05-23 DIAGNOSIS — E039 Hypothyroidism, unspecified: Secondary | ICD-10-CM | POA: Diagnosis not present

## 2020-05-23 DIAGNOSIS — Z885 Allergy status to narcotic agent status: Secondary | ICD-10-CM | POA: Diagnosis not present

## 2020-05-23 DIAGNOSIS — M94262 Chondromalacia, left knee: Secondary | ICD-10-CM | POA: Diagnosis not present

## 2020-05-26 ENCOUNTER — Encounter: Payer: Self-pay | Admitting: Family Medicine

## 2020-05-26 NOTE — Progress Notes (Signed)
   Martha Montgomery     MRN: [id number]      DOB: Mar 27, 1981   HPI Martha Montgomery is here for follow up and re-evaluation of chronic medical conditions, medication management and review of any available recent lab and radiology data.  Preventive health is updated, specifically  Cancer screening and Immunization.   Questions or concerns regarding consultations or procedures which the PT has had in the interim are  addressed. The PT denies any adverse reactions to current medications since the last visit.  There are no new concerns.  There are no specific complaints   ROS Denies recent fever or chills. Denies sinus pressure, nasal congestion, ear pain or sore throat. Denies chest congestion, productive cough or wheezing. Denies chest pains, palpitations and leg swelling Denies abdominal pain, nausea, vomiting,diarrhea or constipation.   Denies dysuria, frequency, hesitancy or incontinence. Denies joint pain, swelling and limitation in mobility. Denies headaches, seizures, numbness, or tingling. Denies depression, anxiety or insomnia. Denies skin break down or rash.   PE  BP (!) 147/96   Pulse (!) 103   Resp 15   Ht 5\' 3"  (1.6 m)   Wt 188 lb (85.3 kg)   SpO2 95%   BMI 33.30 kg/m   Patient alert and oriented and in no cardiopulmonary distress.  HEENT: No facial asymmetry, EOMI,     Neck supple .  Chest: Clear to auscultation bilaterally.  CVS: S1, S2 no murmurs, no S3.Regular rate.  ABD: Soft non tender.   Ext: No edema  MS: Adequate ROM spine, shoulders, hips and knees.  Skin: Intact, no ulcerations or rash noted.  Psych: Good eye contact, normal affect. Memory intact not anxious or depressed appearing.  CNS: CN 2-12 intact, power,  normal throughout.no focal deficits noted.   Assessment & Plan  HTN (hypertension), benign uhcontroled , dose increase in triameren DASH diet and commitment to daily physical activity for a minimum of 30 minutes discussed and encouraged,  as a part of hypertension management. The importance of attaining a healthy weight is also discussed.  BP/Weight 05/22/2020 03/27/2020 03/19/2020 03/18/2020 02/14/2020 08/31/2681 10/26/9620  Systolic BP 297 989 211 - 941 740 814  Diastolic BP 96 83 80 - 88 100 86  Wt. (Lbs) 188 189 - 195 195.8 200 204  BMI 33.3 33.48 34.54 - 34.68 35.43 36.14       Obesity (BMI 30.0-34.9) imoroved  Patient re-educated about  the importance of commitment to a  minimum of 150 minutes of exercise per week as able.  The importance of healthy food choices with portion control discussed, as well as eating regularly and within a 12 hour window most days. The need to choose "clean , green" food 50 to 75% of the time is discussed, as well as to make water the primary drink and set a goal of 64 ounces water daily.    Weight /BMI 05/22/2020 03/27/2020 03/19/2020  WEIGHT 188 lb 189 lb -  HEIGHT 5\' 3"  5\' 3"  -  BMI 33.3 kg/m2 33.48 kg/m2 34.54 kg/m2      Post-surgical hypothyroidism Controlled, no change in medication

## 2020-05-26 NOTE — Assessment & Plan Note (Signed)
Controlled, no change in medication  

## 2020-05-26 NOTE — Assessment & Plan Note (Signed)
imoroved  Patient re-educated about  the importance of commitment to a  minimum of 150 minutes of exercise per week as able.  The importance of healthy food choices with portion control discussed, as well as eating regularly and within a 12 hour window most days. The need to choose "clean , green" food 50 to 75% of the time is discussed, as well as to make water the primary drink and set a goal of 64 ounces water daily.    Weight /BMI 05/22/2020 03/27/2020 03/19/2020  WEIGHT 188 lb 189 lb -  HEIGHT 5\' 3"  5\' 3"  -  BMI 33.3 kg/m2 33.48 kg/m2 34.54 kg/m2

## 2020-05-26 NOTE — Assessment & Plan Note (Signed)
uhcontroled , dose increase in triameren DASH diet and commitment to daily physical activity for a minimum of 30 minutes discussed and encouraged, as a part of hypertension management. The importance of attaining a healthy weight is also discussed.  BP/Weight 05/22/2020 03/27/2020 03/19/2020 03/18/2020 02/14/2020 09/30/6292 01/31/5464  Systolic BP 035 465 681 - 275 170 017  Diastolic BP 96 83 80 - 88 100 86  Wt. (Lbs) 188 189 - 195 195.8 200 204  BMI 33.3 33.48 34.54 - 34.68 35.43 36.14

## 2020-06-05 DIAGNOSIS — Z8739 Personal history of other diseases of the musculoskeletal system and connective tissue: Secondary | ICD-10-CM | POA: Diagnosis not present

## 2020-06-05 DIAGNOSIS — Z4802 Encounter for removal of sutures: Secondary | ICD-10-CM | POA: Diagnosis not present

## 2020-06-18 ENCOUNTER — Other Ambulatory Visit: Payer: Self-pay

## 2020-06-18 ENCOUNTER — Other Ambulatory Visit (HOSPITAL_COMMUNITY)
Admission: RE | Admit: 2020-06-18 | Discharge: 2020-06-18 | Disposition: A | Discharge status: Home / Self Care | Payer: Medicare Other | Source: Ambulatory Visit | Attending: Family Medicine | Admitting: Family Medicine

## 2020-06-18 DIAGNOSIS — M545 Low back pain, unspecified: Secondary | ICD-10-CM | POA: Diagnosis not present

## 2020-06-18 DIAGNOSIS — M797 Fibromyalgia: Secondary | ICD-10-CM | POA: Diagnosis not present

## 2020-06-18 DIAGNOSIS — I10 Essential (primary) hypertension: Secondary | ICD-10-CM | POA: Insufficient documentation

## 2020-06-18 DIAGNOSIS — Z79899 Other long term (current) drug therapy: Secondary | ICD-10-CM | POA: Diagnosis not present

## 2020-06-18 DIAGNOSIS — M25569 Pain in unspecified knee: Secondary | ICD-10-CM | POA: Diagnosis not present

## 2020-06-18 DIAGNOSIS — M130 Polyarthritis, unspecified: Secondary | ICD-10-CM | POA: Diagnosis not present

## 2020-06-18 DIAGNOSIS — Z79891 Long term (current) use of opiate analgesic: Secondary | ICD-10-CM | POA: Diagnosis not present

## 2020-06-18 LAB — COMPREHENSIVE METABOLIC PANEL
ALT: 14 U/L (ref 0–44)
AST: 19 U/L (ref 15–41)
Albumin: 4 g/dL (ref 3.5–5.0)
Alkaline Phosphatase: 86 U/L (ref 38–126)
Anion gap: 10 (ref 5–15)
BUN: 9 mg/dL (ref 6–20)
CO2: 23 mmol/L (ref 22–32)
Calcium: 8.9 mg/dL (ref 8.9–10.3)
Chloride: 102 mmol/L (ref 98–111)
Creatinine, Ser: 0.79 mg/dL (ref 0.44–1.00)
GFR, Estimated: >60 mL/min (ref >60)
Glucose, Bld: 84 mg/dL (ref 70–99)
Potassium: 3.2 mmol/L — ABNORMAL LOW (ref 3.5–5.1)
Sodium: 135 mmol/L (ref 135–145)
Total Bilirubin: 0.4 mg/dL (ref 0.3–1.2)
Total Protein: 7.7 g/dL (ref 6.5–8.1)

## 2020-06-18 LAB — LIPID PANEL
Cholesterol: 244 mg/dL — ABNORMAL HIGH (ref 0–200)
HDL: 70 mg/dL (ref >40)
LDL Cholesterol: 156 mg/dL — ABNORMAL HIGH (ref 0–99)
Total CHOL/HDL Ratio: 3.5 RATIO
Triglycerides: 92 mg/dL (ref <150)
VLDL: 18 mg/dL (ref 0–40)

## 2020-06-20 ENCOUNTER — Other Ambulatory Visit: Payer: Self-pay

## 2020-06-20 ENCOUNTER — Other Ambulatory Visit: Payer: Self-pay | Admitting: Family Medicine

## 2020-06-20 DIAGNOSIS — E785 Hyperlipidemia, unspecified: Secondary | ICD-10-CM

## 2020-06-20 DIAGNOSIS — E876 Hypokalemia: Secondary | ICD-10-CM

## 2020-06-20 MED ORDER — ROSUVASTATIN CALCIUM 10 MG PO TABS: 10.0000 mg | ORAL_TABLET | Freq: Every day | ORAL | 3 refills | Status: DC

## 2020-06-21 LAB — POTASSIUM: Potassium: 4.3 mmol/L (ref 3.5–5.2)

## 2020-06-27 ENCOUNTER — Ambulatory Visit (HOSPITAL_COMMUNITY): Payer: Medicare Other | Attending: Sports Medicine | Admitting: Physical Therapy

## 2020-06-27 ENCOUNTER — Encounter (HOSPITAL_COMMUNITY): Payer: Self-pay | Admitting: Physical Therapy

## 2020-06-27 ENCOUNTER — Other Ambulatory Visit: Payer: Self-pay

## 2020-06-27 DIAGNOSIS — R262 Difficulty in walking, not elsewhere classified: Secondary | ICD-10-CM | POA: Insufficient documentation

## 2020-06-27 DIAGNOSIS — R296 Repeated falls: Secondary | ICD-10-CM | POA: Insufficient documentation

## 2020-06-27 DIAGNOSIS — M25562 Pain in left knee: Secondary | ICD-10-CM | POA: Diagnosis not present

## 2020-06-27 DIAGNOSIS — G8929 Other chronic pain: Secondary | ICD-10-CM | POA: Insufficient documentation

## 2020-06-27 DIAGNOSIS — M25662 Stiffness of left knee, not elsewhere classified: Secondary | ICD-10-CM | POA: Insufficient documentation

## 2020-06-27 NOTE — Therapy (Signed)
[place], [place], [address] Phone: [phone]   Fax:  [phone]  Physical Therapy Evaluation  Patient Details  Name: Martha Montgomery Montgomery MRN: [id number] Date of Birth: Aug 23, 1980 Referring Provider (Martha Montgomery): Martha Montgomery Montgomery   Encounter Date: Montgomery   Martha Montgomery End of Session - 06/27/20 1536    Visit Number 1    Number of Visits 12    Date for Martha Montgomery Re-Evaluation 08/08/20    Progress Note Due on Visit 10    Martha Montgomery Start Time 1448    Martha Montgomery Stop Time 1530    Martha Montgomery Time Calculation (min) 45 min    Activity Tolerance Patient tolerated treatment well    Behavior During Therapy [facility] for tasks assessed/performed           Past Medical History:  Diagnosis Date  . Anxiety   . Anxiety disorder   . Arthritis    "knees, joints, hands, toes" (09/28/2013)  . Depression   . Depression, major, single episode, in partial remission (Martha Montgomery Montgomery) 10/18/2008   Qualifier: Diagnosis of  By: Martha Cipro MD, Martha Montgomery Montgomery  PHQ 9 score of 7 in 10/2017, not suicidal or homicidal, interested in telepsych however, score of 16 in  02/2018, not suicidal or homicidal, start medication and refer to telepsych Score of 7 in 07/2018   . Fibromyalgia   . Gastritis nov. 2011   EGD by Dr. Oneida Montgomery, negative h pylori  . GERD (gastroesophageal reflux disease)   . Gout   . Hypertension x 6 years   . Hypothyroidism   . Migraines    "15/month average" (09/28/2013)  . Spinal stenosis   . Thyroid goiter     Past Surgical History:  Procedure Laterality Date  . BIOPSY  10/20/2017   Procedure: BIOPSY;  Surgeon: Martha Binder, MD;  [facility];  Service: Endoscopy;;  gastric duodenal esophagus  . CESAREAN SECTION  2008  . CHONDROPLASTY  04/02/2012   Procedure: CHONDROPLASTY;  Surgeon: Martha Civil, MD;  Location: [facility];  Service: Orthopedics;  Laterality: Left;  of left patella  . COLONOSCOPY  May 2012   Dr. Oneida Montgomery: normal colon, small internal hemorrhoids  .  COLONOSCOPY WITH PROPOFOL N/A 10/22/2017   Procedure: COLONOSCOPY WITH PROPOFOL;  Surgeon: Martha Binder, MD;  [facility];  Service: Endoscopy;  Laterality: N/A;  7:30am  . COLONOSCOPY WITH PROPOFOL N/A 10/20/2017   Procedure: COLONOSCOPY WITH PROPOFOL;  Surgeon: Martha Binder, MD;  [facility];  Service: Endoscopy;  Laterality: N/A;  11:00am  . ESOPHAGOGASTRODUODENOSCOPY (EGD) WITH PROPOFOL N/A 10/20/2017   Procedure: ESOPHAGOGASTRODUODENOSCOPY (EGD) WITH PROPOFOL;  Surgeon: Martha Binder, MD;  [facility];  Service: Endoscopy;  Laterality: N/A;  . INCISION AND DRAINAGE ABSCESS  1997; 2005   "under right buttocks; under left arm"  . SAVORY DILATION N/A 10/20/2017   Procedure: SAVORY DILATION;  Surgeon: Martha Binder, MD;  [facility];  Service: Endoscopy;  Laterality: N/A;  . THYROIDECTOMY N/A 09/28/2013   Procedure: TOTAL THYROIDECTOMY;  Surgeon: Martha Montgomery Dike, MD;  Location: [facility];  Service: ENT;  Laterality: N/A;  . TOTAL THYROIDECTOMY  09/27/2013  . UPPER GI ENDOSCOPY  2011   EGD: gastritis, no h.pylori    There were no vitals filed for this visit.    Subjective Assessment - 06/27/20 1444    Subjective Martha Montgomery states that she has had knee pain in both of her knees for years.  She had surgery on her Lt knee on Sept. 6th 2013 and  has been thru a bout of physical therapy that did not help.  She was diagnosed with B patellar instabiliy and  therefore she opted to have surgery on her LT knee on 05/23/2020.  She is now being referred to skilled Martha Montgomery for rehab.    Pertinent History anxiety, OA, depression, back pain    Limitations Sitting;Lifting;Standing;Walking;House hold activities    How long can you sit comfortably? 20 minutes    How long can you stand comfortably? Bears wwt on her RT LE for 15-20    How long can you walk comfortably? Martha Montgomery is ambulating with crutches, and brace on with TTWB 15 minutes    Patient Stated Goals Full range of motion  with her knee, some reduction of pain, able to walk longer, stronger.    Currently in Pain? Yes    Pain Score 5    past 3 days 8/10; best 5/10   Pain Location Knee    Pain Orientation Left    Pain Descriptors / Indicators Aching;Stabbing    Pain Type Surgical pain;Acute pain    Pain Onset More than a month ago    Pain Frequency Constant    Aggravating Factors  mornings    Pain Relieving Factors medication    Effect of Pain on Daily Activities limits              [facility] Martha Montgomery Assessment - 06/27/20 0001      Assessment   Medical Diagnosis Lt MPFL and TTO     Referring Provider (Martha Montgomery) Martha Montgomery Montgomery    Onset Date/Surgical Date 05/23/20    Next MD Visit 07/05/2020    Prior Therapy none after this surgery      Precautions   Precautions --   see protocol in media    Required Braces or Orthoses Knee Immobilizer - Left      Restrictions   Weight Bearing Restrictions Yes    LLE Weight Bearing Touchdown weight bearing      Balance Screen   Has the patient fallen in the past 6 months Yes    How many times? 1    Has the patient had a decrease in activity level because of a fear of falling?  Yes    Is the patient reluctant to leave their home because of a fear of falling?  No      Home Ecologist residence      Prior Function   Level of Independence Independent    Vocation On disability      Cognition   Overall Cognitive Status Within Functional Limits for tasks assessed      Observation/Other Assessments   Focus on Therapeutic Outcomes (FOTO)  26; 74% affected       ROM / Strength   AROM / PROM / Strength AROM;Strength      AROM   AROM Assessment Site Hip;Knee    Right/Left Hip Left    Right/Left Knee Left    Left Knee Extension 5    Left Knee Flexion 90      Strength   Strength Assessment Site Hip;Knee    Right/Left Hip Left    Left Hip Flexion 5/5    Left Hip Extension 4/5    Left Hip ABduction 5/5    Right/Left Knee --   deferred       Ambulation/Gait   Ambulation Distance (Feet) 220 Feet  Assistive device Crutches    Gait Comments knee brace on ; 2' walk test       Functional Gait  Assessment   Gait assessed  --                      Objective measurements completed on examination: See above findings.       [facility] Martha Montgomery Treatment/Exercise - 06/27/20 0001      Exercises   Exercises Knee/Hip      Knee/Hip Exercises: Supine   Quad Sets Both;10 reps    Heel Slides Left;10 reps    Straight Leg Raises Left;10 reps                  Martha Montgomery Education - 06/27/20 1535    Education Details HEP    Person(s) Educated Patient    Methods Explanation    Comprehension Verbalized understanding            Martha Montgomery Short Term Goals - 06/27/20 1626      Martha Montgomery SHORT TERM GOAL #1   Title Martha Montgomery pain to be no greater than a 5/10 to allow Martha Montgomery to walk for 30 minutes without crutches while in brace. .    Time 3    Period Weeks    Status New    Target Date 07/18/20      Martha Montgomery SHORT TERM GOAL #2   Title Martha Montgomery to be using heel to toe  gait mechanics with equal stride to improve knee pain.    Time 3    Period Weeks    Status New      Martha Montgomery SHORT TERM GOAL #3   Title Martha Montgomery LT knee flexion to be to 110 degrees to allow Martha Montgomery to be comfortable sitting for an hour    Time 3    Period Weeks    Status New             Martha Montgomery Long Term Goals - 06/27/20 1629      Martha Montgomery LONG TERM GOAL #1   Title Martha Montgomery pain to be no greater than a 3/10 to allow Martha Montgomery to walk for 60 minutes to return to walking for exercise as well as to be able to complete shopping tasks.    Time 6    Period Weeks    Status New    Target Date 08/08/20      Martha Montgomery LONG TERM GOAL #2   Title Martha Montgomery core and LE strength to be at least 4+/5 to allow Martha Montgomery to be able to stand up from a squatted postion without difficulty.    Time 6    Period Weeks    Status New    Target Date 08/08/20      Martha Montgomery LONG TERM GOAL #3   Title Martha Montgomery to have improved flexibility in her hips and knees to be  able to don and doff her shoes and socks without difficulty.    Time 6    Period Weeks    Status New      Martha Montgomery LONG TERM GOAL #4   Title Martha Montgomery to be able to single leg stance on both LE for 30 seconds to allow Martha Montgomery to feel confident walking on uneven ground.    Time 6    Period Weeks    Status New                  Plan - 06/27/20 1538    Clinical Impression Statement  Martha Montgomery Montgomery is a 39 yo who has had B patellar instability for years with the LT LE being worse than the RT.  She had a failed surgery on her Lt knee years ago, due to her knee giving way on her she opted to have a MPFL repair on 05/23/2020.  Her surgery was completed by Dr. Prudy Feeler,  the protocol is scanned in the media section of the Martha Montgomery chart.  She is now being referred to skilled Martha Montgomery.  Evaluation demonstrates Martha Montgomery is unaware how much wt she should be putting on her LE, her knee brace is still locked out at 0 flextion, she has decreased ROM, decreased strength, decreased balance, decreased functional activity tolerande,  increased pain and increased edema.  Martha Montgomery Montgomery will benefit from skilled Martha Montgomery at this time to address these deficits and maximize her functional ability.    Personal Factors and Comorbidities Comorbidity 3+;Past/Current Experience;Time since onset of injury/illness/exacerbation    Comorbidities OA,past surgical intervention, depression, anxiety.    Examination-Activity Limitations Carry;Dressing;Lift;Locomotion Level;Stairs;Squat;Sit    Examination-Participation Restrictions Cleaning;Community Activity;Laundry;Meal Prep;Shop    Stability/Clinical Decision Making Stable/Uncomplicated    Clinical Decision Making Low    Rehab Potential Good    Martha Montgomery Frequency 2x / week    Martha Montgomery Duration 6 weeks    Martha Montgomery Treatment/Interventions Patient/family education;Stair training;Therapeutic activities;Therapeutic exercise;Balance training;Manual techniques;Gait training    Martha Montgomery Next Visit Plan Begin sit to stand, mini squat, heel raises,  standing knee flexion and terminal knee extension; week on 12/6 may D/C crutches begin stationary bike and advanced clsed chain work to include balance    Martha Montgomery Home Exercise Plan ankle pumps, qs, heelslides, SLR, heelraises and mini squat    Consulted and Agree with Plan of Care Patient           Patient will benefit from skilled therapeutic intervention in order to improve the following deficits and impairments:  Pain, Decreased strength, Difficulty walking, Increased edema, Decreased range of motion, Decreased balance, Decreased activity tolerance  Visit Diagnosis: Chronic pain of left knee - Plan: Martha Montgomery plan of care cert/re-cert  Knee stiffness, left - Plan: Martha Montgomery plan of care cert/re-cert  Difficulty in walking, not elsewhere classified - Plan: Martha Montgomery plan of care cert/re-cert  Repeated falls - Plan: Martha Montgomery plan of care cert/re-cert     Problem List Patient Active Problem List   Diagnosis Date Noted  . Depression, major, single episode, moderate ([place]) 01/01/2020  . Dental caries 04/24/2019  . Obesity (BMI 30.0-34.9) 01/08/2019  . Multiple benign lumps of breast 08/17/2018  . Hemorrhoids 05/19/2018  . Encounter for contraceptive management 03/20/2018  . Low vitamin D level 11/30/2017  . Esophageal dysphagia 09/09/2017  . Bloating 03/05/2017  . Bilateral knee pain 01/05/2017  . Amenorrhea 01/05/2017  . Bilateral hip pain 07/22/2016  . Post-surgical hypothyroidism 06/02/2014  . HTN (hypertension), benign 06/30/2012  . Knee pain 06/02/2012  . Patellar malalignment syndrome 03/09/2012  . IBS (irritable bowel syndrome) 08/29/2011  . GERD (gastroesophageal reflux disease) 08/29/2011  . Back pain 09/24/2010  . Migraine headache 04/02/2010  . Constipation 09/12/2009  . Allergic rhinitis 06/16/2009    Martha Montgomery Montgomery, Martha Montgomery Montgomery, Martha Montgomery Montgomery  [facility] [address] [place], [place], [address] Phone: [phone]   Fax:   [phone]  Name: Martha Montgomery Montgomery MRN: [id number] Date of Birth: 1981-03-19

## 2020-06-29 ENCOUNTER — Ambulatory Visit (HOSPITAL_COMMUNITY): Payer: Medicare Other | Admitting: Physical Therapy

## 2020-06-29 ENCOUNTER — Telehealth (HOSPITAL_COMMUNITY): Payer: Self-pay | Admitting: Physical Therapy

## 2020-06-29 NOTE — Telephone Encounter (Signed)
pt cancelled appt because she has a headache

## 2020-07-03 ENCOUNTER — Ambulatory Visit (HOSPITAL_COMMUNITY): Payer: Medicare Other

## 2020-07-03 ENCOUNTER — Telehealth (HOSPITAL_COMMUNITY): Payer: Self-pay

## 2020-07-03 NOTE — Telephone Encounter (Signed)
pt stated that she has a funeral to attend

## 2020-07-04 ENCOUNTER — Ambulatory Visit (HOSPITAL_COMMUNITY): Payer: Medicare Other | Admitting: Psychiatry

## 2020-07-04 ENCOUNTER — Telehealth (HOSPITAL_COMMUNITY): Payer: Self-pay | Admitting: Psychiatry

## 2020-07-04 ENCOUNTER — Other Ambulatory Visit: Payer: Self-pay

## 2020-07-04 NOTE — Telephone Encounter (Signed)
Therapist attempted to contact patient twice via text through [facility] for scheduled appointment, no response.  Therapist called patient and left voicemail message indicating attempt and requesting patient call office.

## 2020-07-05 ENCOUNTER — Encounter (HOSPITAL_COMMUNITY): Payer: Self-pay

## 2020-07-05 ENCOUNTER — Ambulatory Visit (HOSPITAL_COMMUNITY): Payer: Medicare Other

## 2020-07-05 ENCOUNTER — Other Ambulatory Visit: Payer: Self-pay

## 2020-07-05 DIAGNOSIS — G8929 Other chronic pain: Secondary | ICD-10-CM

## 2020-07-05 DIAGNOSIS — R296 Repeated falls: Secondary | ICD-10-CM | POA: Diagnosis not present

## 2020-07-05 DIAGNOSIS — R262 Difficulty in walking, not elsewhere classified: Secondary | ICD-10-CM

## 2020-07-05 DIAGNOSIS — M25361 Other instability, right knee: Secondary | ICD-10-CM | POA: Diagnosis not present

## 2020-07-05 DIAGNOSIS — M25662 Stiffness of left knee, not elsewhere classified: Secondary | ICD-10-CM | POA: Diagnosis not present

## 2020-07-05 DIAGNOSIS — M2202 Recurrent dislocation of patella, left knee: Secondary | ICD-10-CM | POA: Diagnosis not present

## 2020-07-05 DIAGNOSIS — M25362 Other instability, left knee: Secondary | ICD-10-CM | POA: Diagnosis not present

## 2020-07-05 DIAGNOSIS — M25562 Pain in left knee: Secondary | ICD-10-CM | POA: Diagnosis not present

## 2020-07-05 NOTE — Therapy (Signed)
[place], [place], [address] Phone: [phone]   Fax:  [phone]  Physical Therapy Treatment  Patient Details  Name: Martha Montgomery MRN: [id number] Date of Birth: [birth year] Referring Provider (PT): Douglass Rivers   Encounter Date: 07/05/2020   PT End of Session - 07/05/20 1540    Visit Number 2    Number of Visits 12    Date for PT Re-Evaluation 08/08/20    Authorization Type [employer] Medicare    Progress Note Due on Visit 10    PT Start Time 1532    PT Stop Time 1618    PT Time Calculation (min) 46 min    Activity Tolerance Patient tolerated treatment well    Behavior During Therapy [facility] for tasks assessed/performed           Past Medical History:  Diagnosis Date  . Anxiety   . Anxiety disorder   . Arthritis    "knees, joints, hands, toes" (09/28/2013)  . Depression   . Depression, major, single episode, in partial remission ([place]) 10/18/2008   Qualifier: Diagnosis of  By: Moshe Cipro MD, Margaret  PHQ 9 score of 7 in 10/2017, not suicidal or homicidal, interested in telepsych however, score of 16 in  02/2018, not suicidal or homicidal, start medication and refer to telepsych Score of 7 in 07/2018   . Fibromyalgia   . Gastritis nov. 2011   EGD by Dr. Oneida Alar, negative h pylori  . GERD (gastroesophageal reflux disease)   . Gout   . Hypertension x 6 years   . Hypothyroidism   . Migraines    "15/month average" (09/28/2013)  . Spinal stenosis   . Thyroid goiter     Past Surgical History:  Procedure Laterality Date  . BIOPSY  10/20/2017   Procedure: BIOPSY;  Surgeon: Danie Binder, MD;  [facility];  Service: Endoscopy;;  gastric duodenal esophagus  . CESAREAN SECTION  2008  . CHONDROPLASTY  04/02/2012   Procedure: CHONDROPLASTY;  Surgeon: Carole Civil, MD;  [facility]: [facility];  Service: Orthopedics;  Laterality: Left;  of left patella  . COLONOSCOPY  May 2012   Dr. Oneida Alar: normal colon,  small internal hemorrhoids  . COLONOSCOPY WITH PROPOFOL N/A 10/22/2017   Procedure: COLONOSCOPY WITH PROPOFOL;  Surgeon: Danie Binder, MD;  [facility];  Service: Endoscopy;  Laterality: N/A;  7:30am  . COLONOSCOPY WITH PROPOFOL N/A 10/20/2017   Procedure: COLONOSCOPY WITH PROPOFOL;  Surgeon: Danie Binder, MD;  [facility];  Service: Endoscopy;  Laterality: N/A;  11:00am  . ESOPHAGOGASTRODUODENOSCOPY (EGD) WITH PROPOFOL N/A 10/20/2017   Procedure: ESOPHAGOGASTRODUODENOSCOPY (EGD) WITH PROPOFOL;  Surgeon: Danie Binder, MD;  [facility];  Service: Endoscopy;  Laterality: N/A;  . INCISION AND DRAINAGE ABSCESS  1997; 2005   "under right buttocks; under left arm"  . SAVORY DILATION N/A 10/20/2017   Procedure: SAVORY DILATION;  Surgeon: Danie Binder, MD;  [facility];  Service: Endoscopy;  Laterality: N/A;  . THYROIDECTOMY N/A 09/28/2013   Procedure: TOTAL THYROIDECTOMY;  Surgeon: Ascencion Dike, MD;  Location: [facility];  Service: ENT;  Laterality: N/A;  . TOTAL THYROIDECTOMY  09/27/2013  . UPPER GI ENDOSCOPY  2011   EGD: gastritis, no h.pylori    There were no vitals filed for this visit.   Subjective Assessment - 07/05/20 1537    Subjective Pt arrived without crutches, reports she has working on  walking without for about a week.  Reports knee is constantly achey and reports of intermittent sharp pain while walking.  Reports she has began HEP, has some family issues that have limited times available for exercises.    Pertinent History anxiety, OA, depression, back pain    Patient Stated Goals Full range of motion with her knee, some reduction of pain, able to walk longer, stronger.    Currently in Pain? Yes    Pain Score 6     Pain Location Knee    Pain Orientation Left    Pain Descriptors / Indicators Aching;Sharp    Pain Type Acute pain;Surgical pain    Pain Onset More than a month ago    Pain Frequency Constant    Aggravating Factors   morning, nights    Pain Relieving Factors medication    Effect of Pain on Daily Activities limits              [facility] PT Assessment - 07/05/20 0001      Assessment   Medical Diagnosis Lt MPFL and TTO     Referring Provider (PT) Douglass Rivers    Onset Date/Surgical Date 05/23/20    Next MD Visit 08/17/2019    Prior Therapy none after this surgery      Precautions   Precautions --   see protocol in media   Required Braces or Orthoses Knee Immobilizer - Left      Restrictions   Weight Bearing Restrictions Yes    LLE Weight Bearing Weight bearing as tolerated                         [facility] Adult PT Treatment/Exercise - 07/05/20 0001      Ambulation/Gait   Ambulation Distance (Feet) 226 Feet    Assistive device None    Gait Comments Cueing for heel to toe mechanics      Exercises   Exercises Knee/Hip      Knee/Hip Exercises: Aerobic   Stationary Bike 3' rocking      Knee/Hip Exercises: Standing   Heel Raises 10 reps    Knee Flexion Both;10 reps    Knee Flexion Limitations 3" lowering    Terminal Knee Extension 10 reps;Theraband    Theraband Level (Terminal Knee Extension) Level 3 (Green)    Terminal Knee Extension Limitations 5" holds    Functional Squat 2 sets;5 reps    Functional Squat Limitations cueing for mechanics, equal weight bearing      Knee/Hip Exercises: Seated   Sit to Sand 10 reps;without UE support   cueing for equal weight bearing and eccentric control     Knee/Hip Exercises: Supine   Quad Sets Both;10 reps    Heel Slides 5 reps    Knee Extension AROM    Knee Extension Limitations 4 degrees lacking    Knee Flexion AROM    Knee Flexion Limitations 104 degrees flexion                  PT Education - 07/05/20 1543    Education Details Reviewed goals, educated importance of HEP complaince, proper gait training without AD.    Person(s) Educated Patient    Methods Explanation;Demonstration    Comprehension Verbalized  understanding;Returned demonstration            PT Short Term Goals - 06/27/20 1626      PT SHORT TERM GOAL #1   Title PT pain to be no greater  than a 5/10 to allow pt to walk for 30 minutes without crutches while in brace. .    Time 3    Period Weeks    Status New    Target Date 07/18/20      PT SHORT TERM GOAL #2   Title Pt to be using heel to toe  gait mechanics with equal stride to improve knee pain.    Time 3    Period Weeks    Status New      PT SHORT TERM GOAL #3   Title PT LT knee flexion to be to 110 degrees to allow pt to be comfortable sitting for an hour    Time 3    Period Weeks    Status New             PT Long Term Goals - 06/27/20 1629      PT LONG TERM GOAL #1   Title Pt pain to be no greater than a 3/10 to allow pt to walk for 60 minutes to return to walking for exercise as well as to be able to complete shopping tasks.    Time 6    Period Weeks    Status New    Target Date 08/08/20      PT LONG TERM GOAL #2   Title PT core and LE strength to be at least 4+/5 to allow pt to be able to stand up from a squatted postion without difficulty.    Time 6    Period Weeks    Status New    Target Date 08/08/20      PT LONG TERM GOAL #3   Title PT to have improved flexibility in her hips and knees to be able to don and doff her shoes and socks without difficulty.    Time 6    Period Weeks    Status New      PT LONG TERM GOAL #4   Title Pt to be able to single leg stance on both LE for 30 seconds to allow pt to feel confident walking on uneven ground.    Time 6    Period Weeks    Status New                 Plan - 07/05/20 1558    Clinical Impression Statement Pt at 6 week post-op.  Pt with MD apt earlier today, instructed to continue with brace for 6 additional weeks.  Reviewed goals, educated importance of HEP, pt able to recall current exercise program though admits she hasn't been able to complete daily due to family sickness issues.  Did  verbalize understanding for importance of compliance.  Session focus with knee mobility and strengthening within protocol.  Gait training wiht verbal cueing to improve heel to toe mechanics, equal stride and stance phase.  AROM improved to 4-104 degrees.  Added bike today, unable to make full revolution just rocking this session for gentle stretch to assist with mobility.  Cueing through session with standing exercises for equal weight bearing and overall mechanics.    Personal Factors and Comorbidities Comorbidity 3+;Past/Current Experience;Time since onset of injury/illness/exacerbation    Comorbidities OA,past surgical intervention, depression, anxiety.    Examination-Activity Limitations Carry;Dressing;Lift;Locomotion Level;Stairs;Squat;Sit    Examination-Participation Restrictions Cleaning;Community Activity;Laundry;Meal Prep;Shop    Stability/Clinical Decision Making Stable/Uncomplicated    Clinical Decision Making Low    Rehab Potential Good    PT Frequency 2x / week    PT Duration  6 weeks    PT Treatment/Interventions Patient/family education;Stair training;Therapeutic activities;Therapeutic exercise;Balance training;Manual techniques;Gait training    PT Next Visit Plan Continue phase III per protocol til 07/18/20 with focus on knee mobility and strengthening including:  sit to stand, mini squat, heel raises, standing knee flexion and terminal knee extension; advanced clsed chain work to include balance    PT Home Exercise Plan ankle pumps, qs, heelslides, SLR, heelraises and mini squat           Patient will benefit from skilled therapeutic intervention in order to improve the following deficits and impairments:  Pain,Decreased strength,Difficulty walking,Increased edema,Decreased range of motion,Decreased balance,Decreased activity tolerance  Visit Diagnosis: Chronic pain of left knee  Knee stiffness, left  Difficulty in walking, not elsewhere classified  Repeated  falls     Problem List Patient Active Problem List   Diagnosis Date Noted  . Depression, major, single episode, moderate ([place]) 01/01/2020  . Dental caries 04/24/2019  . Obesity (BMI 30.0-34.9) 01/08/2019  . Multiple benign lumps of breast 08/17/2018  . Hemorrhoids 05/19/2018  . Encounter for contraceptive management 03/20/2018  . Low vitamin D level 11/30/2017  . Esophageal dysphagia 09/09/2017  . Bloating 03/05/2017  . Bilateral knee pain 01/05/2017  . Amenorrhea 01/05/2017  . Bilateral hip pain 07/22/2016  . Post-surgical hypothyroidism 06/02/2014  . HTN (hypertension), benign 06/30/2012  . Knee pain 06/02/2012  . Patellar malalignment syndrome 03/09/2012  . IBS (irritable bowel syndrome) 08/29/2011  . GERD (gastroesophageal reflux disease) 08/29/2011  . Back pain 09/24/2010  . Migraine headache 04/02/2010  . Constipation 09/12/2009  . Allergic rhinitis 06/16/2009   Ihor Austin, LPTA/CLT; CBIS [phone]  Aldona Lento 07/05/2020, 4:41 PM  [facility] [address] [place], [place], [address] Phone: [phone]   Fax:  [phone]  Name: Martha Montgomery MRN: [id number] Date of Birth: Apr 14, 1981

## 2020-07-10 ENCOUNTER — Ambulatory Visit (HOSPITAL_COMMUNITY): Payer: Medicare Other | Admitting: Physical Therapy

## 2020-07-10 ENCOUNTER — Other Ambulatory Visit: Payer: Self-pay

## 2020-07-10 ENCOUNTER — Telehealth (HOSPITAL_COMMUNITY): Payer: Self-pay | Admitting: Physical Therapy

## 2020-07-10 DIAGNOSIS — M25562 Pain in left knee: Secondary | ICD-10-CM | POA: Diagnosis not present

## 2020-07-10 DIAGNOSIS — M25662 Stiffness of left knee, not elsewhere classified: Secondary | ICD-10-CM

## 2020-07-10 DIAGNOSIS — R296 Repeated falls: Secondary | ICD-10-CM

## 2020-07-10 DIAGNOSIS — R262 Difficulty in walking, not elsewhere classified: Secondary | ICD-10-CM

## 2020-07-10 DIAGNOSIS — G8929 Other chronic pain: Secondary | ICD-10-CM | POA: Diagnosis not present

## 2020-07-10 NOTE — Telephone Encounter (Signed)
No show # 1 Called pt; left message of when her next appointment was and to please call if she can not make it.  Martha Montgomery, [place] CLT [phone]

## 2020-07-10 NOTE — Therapy (Signed)
[place], [place], [address] Phone: [phone]   Fax:  [phone]  Physical Therapy Treatment  Patient Details  Name: Martha Montgomery MRN: [id number] Date of Birth: 08-Dec-1980 Referring Provider (PT): Douglass Rivers   Encounter Date: 07/10/2020   PT End of Session - 07/10/20 1655    Visit Number 3    Number of Visits 12    Date for PT Re-Evaluation 08/08/20    Authorization Type [employer] Medicare    Progress Note Due on Visit 10    PT Start Time 1620    PT Stop Time 1658    PT Time Calculation (min) 38 min    Activity Tolerance Patient tolerated treatment well    Behavior During Therapy [facility] for tasks assessed/performed           Past Medical History:  Diagnosis Date  . Anxiety   . Anxiety disorder   . Arthritis    "knees, joints, hands, toes" (09/28/2013)  . Depression   . Depression, major, single episode, in partial remission ([place]) 10/18/2008   Qualifier: Diagnosis of  By: Moshe Cipro MD, Margaret  PHQ 9 score of 7 in 10/2017, not suicidal or homicidal, interested in telepsych however, score of 16 in  02/2018, not suicidal or homicidal, start medication and refer to telepsych Score of 7 in 07/2018   . Fibromyalgia   . Gastritis nov. 2011   EGD by Dr. Oneida Alar, negative h pylori  . GERD (gastroesophageal reflux disease)   . Gout   . Hypertension x 6 years   . Hypothyroidism   . Migraines    "15/month average" (09/28/2013)  . Spinal stenosis   . Thyroid goiter     Past Surgical History:  Procedure Laterality Date  . BIOPSY  10/20/2017   Procedure: BIOPSY;  Surgeon: Danie Binder, MD;  [facility];  Service: Endoscopy;;  gastric duodenal esophagus  . CESAREAN SECTION  2008  . CHONDROPLASTY  04/02/2012   Procedure: CHONDROPLASTY;  Surgeon: Carole Civil, MD;  [facility]: [facility];  Service: Orthopedics;  Laterality: Left;  of left patella  . COLONOSCOPY  May 2012   Dr. Oneida Alar: normal colon,  small internal hemorrhoids  . COLONOSCOPY WITH PROPOFOL N/A 10/22/2017   Procedure: COLONOSCOPY WITH PROPOFOL;  Surgeon: Danie Binder, MD;  [facility];  Service: Endoscopy;  Laterality: N/A;  7:30am  . COLONOSCOPY WITH PROPOFOL N/A 10/20/2017   Procedure: COLONOSCOPY WITH PROPOFOL;  Surgeon: Danie Binder, MD;  [facility];  Service: Endoscopy;  Laterality: N/A;  11:00am  . ESOPHAGOGASTRODUODENOSCOPY (EGD) WITH PROPOFOL N/A 10/20/2017   Procedure: ESOPHAGOGASTRODUODENOSCOPY (EGD) WITH PROPOFOL;  Surgeon: Danie Binder, MD;  [facility];  Service: Endoscopy;  Laterality: N/A;  . INCISION AND DRAINAGE ABSCESS  1997; 2005   "under right buttocks; under left arm"  . SAVORY DILATION N/A 10/20/2017   Procedure: SAVORY DILATION;  Surgeon: Danie Binder, MD;  [facility];  Service: Endoscopy;  Laterality: N/A;  . THYROIDECTOMY N/A 09/28/2013   Procedure: TOTAL THYROIDECTOMY;  Surgeon: Ascencion Dike, MD;  Location: [facility];  Service: ENT;  Laterality: N/A;  . TOTAL THYROIDECTOMY  09/27/2013  . UPPER GI ENDOSCOPY  2011   EGD: gastritis, no h.pylori    There were no vitals filed for this visit.   Subjective Assessment - 07/10/20 1631    Subjective pt states she seen MD last week and has  6 more weeks in the brace.  Reports her pain is elevated today to 7/10 secondary to being up alot on her LE's.    Currently in Pain? Yes    Pain Score 7     Pain Location Knee    Pain Orientation Left    Pain Descriptors / Indicators Tender;Aching;Sore    Pain Radiating Towards behind the knee cap                             [facility] Adult PT Treatment/Exercise - 07/10/20 0001      Knee/Hip Exercises: Stretches   Knee: Self-Stretch to increase Flexion Left;10 seconds    Knee: Self-Stretch Limitations 10 reps      Knee/Hip Exercises: Aerobic   Stationary Bike 3' rocking seat 14      Knee/Hip Exercises: Standing   Heel Raises 15 reps    Knee  Flexion Both;15 reps    Knee Flexion Limitations 3" lowering    Forward Lunges Both;15 reps;Limitations    Forward Lunges Limitations onto 4" step no UE's    Lateral Step Up Both;15 reps;Hand Hold: 1;Step Height: 4"    Forward Step Up Both;15 reps;Hand Hold: 0;Step Height: 4"    Functional Squat 15 reps    Functional Squat Limitations cueing for mechanics, equal weight bearing                    PT Short Term Goals - 06/27/20 1626      PT SHORT TERM GOAL #1   Title PT pain to be no greater than a 5/10 to allow pt to walk for 30 minutes without crutches while in brace. .    Time 3    Period Weeks    Status New    Target Date 07/18/20      PT SHORT TERM GOAL #2   Title Pt to be using heel to toe  gait mechanics with equal stride to improve knee pain.    Time 3    Period Weeks    Status New      PT SHORT TERM GOAL #3   Title PT LT knee flexion to be to 110 degrees to allow pt to be comfortable sitting for an hour    Time 3    Period Weeks    Status New             PT Long Term Goals - 06/27/20 1629      PT LONG TERM GOAL #1   Title Pt pain to be no greater than a 3/10 to allow pt to walk for 60 minutes to return to walking for exercise as well as to be able to complete shopping tasks.    Time 6    Period Weeks    Status New    Target Date 08/08/20      PT LONG TERM GOAL #2   Title PT core and LE strength to be at least 4+/5 to allow pt to be able to stand up from a squatted postion without difficulty.    Time 6    Period Weeks    Status New    Target Date 08/08/20      PT LONG TERM GOAL #3   Title PT to have improved flexibility in her hips and knees to be able to don and doff her shoes and socks without difficulty.    Time 6    Period Weeks  Status New      PT LONG TERM GOAL #4   Title Pt to be able to single leg stance on both LE for 30 seconds to allow pt to feel confident walking on uneven ground.    Time 6    Period Weeks    Status New                  Plan - 07/10/20 1659    Clinical Impression Statement Continued with focus on improving Lt knee flexion and functional strength of bil LE's.  Began with bike with seat at 14 and still unable to complete a full revolution.  Added standing knee flexion stretch using 12" step and began step ups and squats as well working through ROM.  Noted mm weakness completing these tasks bilaterally.  Pt able to complete without any complaints of pain, however with noted fatigue.    Personal Factors and Comorbidities Comorbidity 3+;Past/Current Experience;Time since onset of injury/illness/exacerbation    Comorbidities OA,past surgical intervention, depression, anxiety.    Examination-Activity Limitations Carry;Dressing;Lift;Locomotion Level;Stairs;Squat;Sit    Examination-Participation Restrictions Cleaning;Community Activity;Laundry;Meal Prep;Shop    Stability/Clinical Decision Making Stable/Uncomplicated    Rehab Potential Good    PT Frequency 2x / week    PT Duration 6 weeks    PT Treatment/Interventions Patient/family education;Stair training;Therapeutic activities;Therapeutic exercise;Balance training;Manual techniques;Gait training    PT Next Visit Plan Continue phase III per protocol til 07/18/20 with focus on knee mobility and strengthening.    PT Home Exercise Plan ankle pumps, qs, heelslides, SLR, heelraises and mini squat           Patient will benefit from skilled therapeutic intervention in order to improve the following deficits and impairments:  Pain,Decreased strength,Difficulty walking,Increased edema,Decreased range of motion,Decreased balance,Decreased activity tolerance  Visit Diagnosis: Knee stiffness, left  Difficulty in walking, not elsewhere classified  Repeated falls  Chronic pain of left knee     Problem List Patient Active Problem List   Diagnosis Date Noted  . Depression, major, single episode, moderate ([place]) 01/01/2020  . Dental caries  04/24/2019  . Obesity (BMI 30.0-34.9) 01/08/2019  . Multiple benign lumps of breast 08/17/2018  . Hemorrhoids 05/19/2018  . Encounter for contraceptive management 03/20/2018  . Low vitamin D level 11/30/2017  . Esophageal dysphagia 09/09/2017  . Bloating 03/05/2017  . Bilateral knee pain 01/05/2017  . Amenorrhea 01/05/2017  . Bilateral hip pain 07/22/2016  . Post-surgical hypothyroidism 06/02/2014  . HTN (hypertension), benign 06/30/2012  . Knee pain 06/02/2012  . Patellar malalignment syndrome 03/09/2012  . IBS (irritable bowel syndrome) 08/29/2011  . GERD (gastroesophageal reflux disease) 08/29/2011  . Back pain 09/24/2010  . Migraine headache 04/02/2010  . Constipation 09/12/2009  . Allergic rhinitis 06/16/2009   Teena Irani, PTA/CLT [phone]  Teena Irani 07/10/2020, 5:00 PM  [place] [address] [place], [place], [address] Phone: [phone]   Fax:  [phone]  Name: Martha Montgomery MRN: [id number] Date of Birth: August 06, 1980

## 2020-07-12 ENCOUNTER — Ambulatory Visit (HOSPITAL_COMMUNITY): Payer: Medicare Other

## 2020-07-12 ENCOUNTER — Telehealth (HOSPITAL_COMMUNITY): Payer: Self-pay

## 2020-07-12 NOTE — Telephone Encounter (Signed)
No show, called and left message concerning missed apt.  Included next apt date and time wiht contact number if needs to reschedule or cancel apt.    Ihor Austin, LPTA/CLT; Delana Meyer [phone]

## 2020-07-16 DIAGNOSIS — M130 Polyarthritis, unspecified: Secondary | ICD-10-CM | POA: Diagnosis not present

## 2020-07-16 DIAGNOSIS — I10 Essential (primary) hypertension: Secondary | ICD-10-CM | POA: Diagnosis not present

## 2020-07-16 DIAGNOSIS — M25569 Pain in unspecified knee: Secondary | ICD-10-CM | POA: Diagnosis not present

## 2020-07-16 DIAGNOSIS — Z79899 Other long term (current) drug therapy: Secondary | ICD-10-CM | POA: Diagnosis not present

## 2020-07-16 DIAGNOSIS — M545 Low back pain, unspecified: Secondary | ICD-10-CM | POA: Diagnosis not present

## 2020-07-16 DIAGNOSIS — M797 Fibromyalgia: Secondary | ICD-10-CM | POA: Diagnosis not present

## 2020-07-16 DIAGNOSIS — Z79891 Long term (current) use of opiate analgesic: Secondary | ICD-10-CM | POA: Diagnosis not present

## 2020-07-17 ENCOUNTER — Telehealth (HOSPITAL_COMMUNITY): Payer: Self-pay | Admitting: Physical Therapy

## 2020-07-17 ENCOUNTER — Ambulatory Visit (HOSPITAL_COMMUNITY): Payer: Medicare Other | Admitting: Physical Therapy

## 2020-07-17 NOTE — Telephone Encounter (Signed)
pt cancelled appt for today becuase she is in [place] with her mother at an MD appt

## 2020-07-19 ENCOUNTER — Ambulatory Visit (HOSPITAL_COMMUNITY): Payer: Medicare Other

## 2020-07-24 ENCOUNTER — Encounter (HOSPITAL_COMMUNITY): Payer: Self-pay | Admitting: Physical Therapy

## 2020-07-24 ENCOUNTER — Other Ambulatory Visit: Payer: Self-pay

## 2020-07-24 ENCOUNTER — Ambulatory Visit (HOSPITAL_COMMUNITY): Payer: Medicare Other | Admitting: Physical Therapy

## 2020-07-24 DIAGNOSIS — M25662 Stiffness of left knee, not elsewhere classified: Secondary | ICD-10-CM

## 2020-07-24 DIAGNOSIS — G8929 Other chronic pain: Secondary | ICD-10-CM | POA: Diagnosis not present

## 2020-07-24 DIAGNOSIS — R262 Difficulty in walking, not elsewhere classified: Secondary | ICD-10-CM | POA: Diagnosis not present

## 2020-07-24 DIAGNOSIS — R296 Repeated falls: Secondary | ICD-10-CM | POA: Diagnosis not present

## 2020-07-24 DIAGNOSIS — M25562 Pain in left knee: Secondary | ICD-10-CM | POA: Diagnosis not present

## 2020-07-24 NOTE — Therapy (Signed)
[place], [place], [address] Phone: [phone]   Fax:  [phone]  Physical Therapy Treatment  Patient Details  Name: Martha Montgomery MRN: [id number] Date of Birth: 29-Sep-1980 Referring Provider (PT): Douglass Rivers   Encounter Date: 07/24/2020   PT End of Session - 07/24/20 1200    Visit Number 4    Number of Visits 12    Date for PT Re-Evaluation 08/08/20    Authorization Type [employer] Medicare    Progress Note Due on Visit 10    PT Start Time 1133    PT Stop Time 1215    PT Time Calculation (min) 42 min    Activity Tolerance Patient tolerated treatment well    Behavior During Therapy [facility] for tasks assessed/performed           Past Medical History:  Diagnosis Date  . Anxiety   . Anxiety disorder   . Arthritis    "knees, joints, hands, toes" (09/28/2013)  . Depression   . Depression, major, single episode, in partial remission ([place]) 10/18/2008   Qualifier: Diagnosis of  By: Moshe Cipro MD, Margaret  PHQ 9 score of 7 in 10/2017, not suicidal or homicidal, interested in telepsych however, score of 16 in  02/2018, not suicidal or homicidal, start medication and refer to telepsych Score of 7 in 07/2018   . Fibromyalgia   . Gastritis nov. 2011   EGD by Dr. Oneida Alar, negative h pylori  . GERD (gastroesophageal reflux disease)   . Gout   . Hypertension x 6 years   . Hypothyroidism   . Migraines    "15/month average" (09/28/2013)  . Spinal stenosis   . Thyroid goiter     Past Surgical History:  Procedure Laterality Date  . BIOPSY  10/20/2017   Procedure: BIOPSY;  Surgeon: Danie Binder, MD;  [facility];  Service: Endoscopy;;  gastric duodenal esophagus  . CESAREAN SECTION  2008  . CHONDROPLASTY  04/02/2012   Procedure: CHONDROPLASTY;  Surgeon: Carole Civil, MD;  [facility];  Service: Orthopedics;  Laterality: Left;  of left patella  . COLONOSCOPY  May 2012   Dr. Oneida Alar: normal colon,  small internal hemorrhoids  . COLONOSCOPY WITH PROPOFOL N/A 10/22/2017   Procedure: COLONOSCOPY WITH PROPOFOL;  Surgeon: Danie Binder, MD;  [facility];  Service: Endoscopy;  Laterality: N/A;  7:30am  . COLONOSCOPY WITH PROPOFOL N/A 10/20/2017   Procedure: COLONOSCOPY WITH PROPOFOL;  Surgeon: Danie Binder, MD;  [facility];  Service: Endoscopy;  Laterality: N/A;  11:00am  . ESOPHAGOGASTRODUODENOSCOPY (EGD) WITH PROPOFOL N/A 10/20/2017   Procedure: ESOPHAGOGASTRODUODENOSCOPY (EGD) WITH PROPOFOL;  Surgeon: Danie Binder, MD;  [facility];  Service: Endoscopy;  Laterality: N/A;  . INCISION AND DRAINAGE ABSCESS  1997; 2005   "under right buttocks; under left arm"  . SAVORY DILATION N/A 10/20/2017   Procedure: SAVORY DILATION;  Surgeon: Danie Binder, MD;  [facility];  Service: Endoscopy;  Laterality: N/A;  . THYROIDECTOMY N/A 09/28/2013   Procedure: TOTAL THYROIDECTOMY;  Surgeon: Ascencion Dike, MD;  Location: [facility];  Service: ENT;  Laterality: N/A;  . TOTAL THYROIDECTOMY  09/27/2013  . UPPER GI ENDOSCOPY  2011   EGD: gastritis, no h.pylori    There were no vitals filed for this visit.   Subjective Assessment - 07/24/20 1135    Subjective PT states she no longer needs to use the  brace.  HEr main limitation at this time is bending her knee and standing in one place for to long.    Pertinent History anxiety, OA, depression, back pain    Currently in Pain? Yes    Pain Score 4     Pain Location Knee    Pain Orientation Left    Pain Descriptors / Indicators Sore    Pain Type Acute pain    Pain Onset More than a month ago    Pain Frequency Constant    Aggravating Factors  morning , nights    Pain Relieving Factors meds but is taking less    Effect of Pain on Daily Activities limits                             [facility] Adult PT Treatment/Exercise - 07/24/20 0001      Exercises   Exercises Knee/Hip      Knee/Hip Exercises:  Stretches   Quad Stretch Left;5 reps;20 seconds    Quad Stretch Limitations prone    Knee: Self-Stretch to increase Flexion Left;10 seconds    Knee: Self-Stretch Limitations 5 reps    Gastroc Stretch Limitations slant board x 1 minute      Knee/Hip Exercises: Aerobic   Stationary Bike full revolution at 13      Knee/Hip Exercises: Standing   Heel Raises 15 reps;10 reps    Heel Raises Limitations down on Lt only    Knee Flexion Both;15 reps    Forward Lunges Both;15 reps;Limitations    Forward Lunges Limitations onto 4" step no UE's    Terminal Knee Extension Left;10 reps    Lateral Step Up 15 reps;Hand Hold: 1;Left;Step Height: 6"    Forward Step Up 15 reps;Hand Hold: 0;Left;Step Height: 6"    Functional Squat 15 reps      Knee/Hip Exercises: Supine   Knee Extension Limitations 2    Knee Flexion Limitations 122                    PT Short Term Goals - 06/27/20 1626      PT SHORT TERM GOAL #1   Title PT pain to be no greater than a 5/10 to allow pt to walk for 30 minutes without crutches while in brace. .    Time 3    Period Weeks    Status New    Target Date 07/18/20      PT SHORT TERM GOAL #2   Title Pt to be using heel to toe  gait mechanics with equal stride to improve knee pain.    Time 3    Period Weeks    Status New      PT SHORT TERM GOAL #3   Title PT LT knee flexion to be to 110 degrees to allow pt to be comfortable sitting for an hour    Time 3    Period Weeks    Status New             PT Long Term Goals - 06/27/20 1629      PT LONG TERM GOAL #1   Title Pt pain to be no greater than a 3/10 to allow pt to walk for 60 minutes to return to walking for exercise as well as to be able to complete shopping tasks.    Time 6    Period Weeks    Status New    Target Date  08/08/20      PT LONG TERM GOAL #2   Title PT core and LE strength to be at least 4+/5 to allow pt to be able to stand up from a squatted postion without difficulty.    Time 6     Period Weeks    Status New    Target Date 08/08/20      PT LONG TERM GOAL #3   Title PT to have improved flexibility in her hips and knees to be able to don and doff her shoes and socks without difficulty.    Time 6    Period Weeks    Status New      PT LONG TERM GOAL #4   Title Pt to be able to single leg stance on both LE for 30 seconds to allow pt to feel confident walking on uneven ground.    Time 6    Period Weeks    Status New                 Plan - 07/24/20 1201    Clinical Impression Statement Pt progressed to a full revolution on bike, increased step up to 6" to be closer to a step height, added quad stretch to improve knee flexion.  PT continues to demonstrate decreased ROM and stregth as well as balance.    Personal Factors and Comorbidities Comorbidity 3+;Past/Current Experience;Time since onset of injury/illness/exacerbation    Comorbidities OA,past surgical intervention, depression, anxiety.    Examination-Activity Limitations Carry;Dressing;Lift;Locomotion Level;Stairs;Squat;Sit    Examination-Participation Restrictions Cleaning;Community Activity;Laundry;Meal Prep;Shop    Stability/Clinical Decision Making Stable/Uncomplicated    Rehab Potential Good    PT Frequency 2x / week    PT Duration 6 weeks    PT Treatment/Interventions Patient/family education;Stair training;Therapeutic activities;Therapeutic exercise;Balance training;Manual techniques;Gait training    PT Next Visit Plan Continue phase III per protocol til 07/18/20 with focus on knee mobility and strengthening.    PT Home Exercise Plan ankle pumps, qs, heelslides, SLR, heelraises and mini squat           Patient will benefit from skilled therapeutic intervention in order to improve the following deficits and impairments:  Pain,Decreased strength,Difficulty walking,Increased edema,Decreased range of motion,Decreased balance,Decreased activity tolerance  Visit Diagnosis: Knee stiffness,  left  Difficulty in walking, not elsewhere classified     Problem List Patient Active Problem List   Diagnosis Date Noted  . Depression, major, single episode, moderate ([facility]) 01/01/2020  . Dental caries 04/24/2019  . Obesity (BMI 30.0-34.9) 01/08/2019  . Multiple benign lumps of breast 08/17/2018  . Hemorrhoids 05/19/2018  . Encounter for contraceptive management 03/20/2018  . Low vitamin D level 11/30/2017  . Esophageal dysphagia 09/09/2017  . Bloating 03/05/2017  . Bilateral knee pain 01/05/2017  . Amenorrhea 01/05/2017  . Bilateral hip pain 07/22/2016  . Post-surgical hypothyroidism 06/02/2014  . HTN (hypertension), benign 06/30/2012  . Knee pain 06/02/2012  . Patellar malalignment syndrome 03/09/2012  . IBS (irritable bowel syndrome) 08/29/2011  . GERD (gastroesophageal reflux disease) 08/29/2011  . Back pain 09/24/2010  . Migraine headache 04/02/2010  . Constipation 09/12/2009  . Allergic rhinitis 06/16/2009   Virgina Organ, PT CLT [phone] 07/24/2020, 12:20 PM  [facility] [address] [place], [place], [address] Phone: [phone]   Fax:  [phone]  Name: KATHLINE BANBURY MRN: [id number] Date of Birth: [birth year]

## 2020-07-26 ENCOUNTER — Other Ambulatory Visit: Payer: Self-pay

## 2020-07-26 ENCOUNTER — Ambulatory Visit (HOSPITAL_COMMUNITY): Payer: Medicare Other

## 2020-07-26 ENCOUNTER — Encounter (HOSPITAL_COMMUNITY): Payer: Self-pay

## 2020-07-26 DIAGNOSIS — R296 Repeated falls: Secondary | ICD-10-CM

## 2020-07-26 DIAGNOSIS — M25662 Stiffness of left knee, not elsewhere classified: Secondary | ICD-10-CM

## 2020-07-26 DIAGNOSIS — R262 Difficulty in walking, not elsewhere classified: Secondary | ICD-10-CM | POA: Diagnosis not present

## 2020-07-26 DIAGNOSIS — M25562 Pain in left knee: Secondary | ICD-10-CM

## 2020-07-26 DIAGNOSIS — G8929 Other chronic pain: Secondary | ICD-10-CM | POA: Diagnosis not present

## 2020-07-26 NOTE — Therapy (Signed)
[facility] [address] [place], [place], [address] Phone: [phone]   Fax:  [phone]  Physical Therapy Treatment  Patient Details  Name: Martha Montgomery MRN: [id number] Date of Birth: 1980/10/13 Referring Provider (PT): Floria Raveling   Encounter Date: 07/26/2020   PT End of Session - 07/26/20 1614    Visit Number 5    Number of Visits 12    Date for PT Re-Evaluation 08/08/20    Authorization Type UHC Medicare    Progress Note Due on Visit 10    PT Start Time 1603    PT Stop Time 1642    PT Time Calculation (min) 39 min    Activity Tolerance Patient tolerated treatment well    Behavior During Therapy [facility] for tasks assessed/performed           Past Medical History:  Diagnosis Date  . Anxiety   . Anxiety disorder   . Arthritis    "knees, joints, hands, toes" (09/28/2013)  . Depression   . Depression, major, single episode, in partial remission (HCC) 10/18/2008   Qualifier: Diagnosis of  By: Lodema Hong MD, Margaret  PHQ 9 score of 7 in 10/2017, not suicidal or homicidal, interested in telepsych however, score of 16 in  02/2018, not suicidal or homicidal, start medication and refer to telepsych Score of 7 in 07/2018   . Fibromyalgia   . Gastritis nov. 2011   EGD by Dr. Darrick Penna, negative h pylori  . GERD (gastroesophageal reflux disease)   . Gout   . Hypertension x 6 years   . Hypothyroidism   . Migraines    "15/month average" (09/28/2013)  . Spinal stenosis   . Thyroid goiter     Past Surgical History:  Procedure Laterality Date  . BIOPSY  10/20/2017   Procedure: BIOPSY;  Surgeon: West Bali, MD;  [facility];  Service: Endoscopy;;  gastric duodenal esophagus  . CESAREAN SECTION  2008  . CHONDROPLASTY  04/02/2012   Procedure: CHONDROPLASTY;  Surgeon: Vickki Hearing, MD;  Location: [facility] ORS;  Service: Orthopedics;  Laterality: Left;  of left patella  . COLONOSCOPY  May 2012   Dr. Darrick Penna: normal colon,  small internal hemorrhoids  . COLONOSCOPY WITH PROPOFOL N/A 10/22/2017   Procedure: COLONOSCOPY WITH PROPOFOL;  Surgeon: West Bali, MD;  [facility];  Service: Endoscopy;  Laterality: N/A;  7:30am  . COLONOSCOPY WITH PROPOFOL N/A 10/20/2017   Procedure: COLONOSCOPY WITH PROPOFOL;  Surgeon: West Bali, MD;  [facility];  Service: Endoscopy;  Laterality: N/A;  11:00am  . ESOPHAGOGASTRODUODENOSCOPY (EGD) WITH PROPOFOL N/A 10/20/2017   Procedure: ESOPHAGOGASTRODUODENOSCOPY (EGD) WITH PROPOFOL;  Surgeon: West Bali, MD;  [facility];  Service: Endoscopy;  Laterality: N/A;  . INCISION AND DRAINAGE ABSCESS  1997; 2005   "under right buttocks; under left arm"  . SAVORY DILATION N/A 10/20/2017   Procedure: SAVORY DILATION;  Surgeon: West Bali, MD;  [facility]: [facility];  Service: Endoscopy;  Laterality: N/A;  . THYROIDECTOMY N/A 09/28/2013   Procedure: TOTAL THYROIDECTOMY;  Surgeon: Darletta Moll, MD;  [facility]: [facility];  Service: ENT;  Laterality: N/A;  . TOTAL THYROIDECTOMY  09/27/2013  . UPPER GI ENDOSCOPY  2011   EGD: gastritis, no h.pylori    There were no vitals filed for this visit.   Subjective Assessment - 07/26/20 1609    Subjective Patient reports increase in left knee soreness where she  has been more active and reports difficulty in sleeping due to her knee aching at night.  Patient reoprts she has been having difficulty performing HEP due to time/space constraints    Pertinent History anxiety, OA, depression, back pain    Pain Onset More than a month ago              [facility] PT Assessment - 07/26/20 0001      Assessment   Medical Diagnosis Lt MPFL and TTO     Referring Provider (PT) Floria Raveling    Onset Date/Surgical Date 05/23/20    Next MD Visit 08/16/2020                         [facility] Adult PT Treatment/Exercise - 07/26/20 0001      Knee/Hip Exercises: Standing   Functional Squat 15 reps   red loop around  knees     Knee/Hip Exercises: Seated   Hamstring Curl Strengthening;2 sets;10 reps;Left   red t-loop     Knee/Hip Exercises: Supine   Quad Sets Strengthening;Both;2 sets;10 reps    Other Supine Knee/Hip Exercises supine hip flexion with resistance loop 2x10      Knee/Hip Exercises: Sidelying   Hip ABduction 2 sets;10 reps      Knee/Hip Exercises: Prone   Hip Extension Strengthening;Left;2 sets;10 reps                  PT Education - 07/26/20 1613    Education Details Pt educated on HEP review and importance of compliance with home-program to improve outcomes    Person(s) Educated Patient    Methods Explanation    Comprehension Verbalized understanding;Returned demonstration            PT Short Term Goals - 06/27/20 1626      PT SHORT TERM GOAL #1   Title PT pain to be no greater than a 5/10 to allow pt to walk for 30 minutes without crutches while in brace. .    Time 3    Period Weeks    Status New    Target Date 07/18/20      PT SHORT TERM GOAL #2   Title Pt to be using heel to toe  gait mechanics with equal stride to improve knee pain.    Time 3    Period Weeks    Status New      PT SHORT TERM GOAL #3   Title PT LT knee flexion to be to 110 degrees to allow pt to be comfortable sitting for an hour    Time 3    Period Weeks    Status New             PT Long Term Goals - 06/27/20 1629      PT LONG TERM GOAL #1   Title Pt pain to be no greater than a 3/10 to allow pt to walk for 60 minutes to return to walking for exercise as well as to be able to complete shopping tasks.    Time 6    Period Weeks    Status New    Target Date 08/08/20      PT LONG TERM GOAL #2   Title PT core and LE strength to be at least 4+/5 to allow pt to be able to stand up from a squatted postion without difficulty.    Time 6    Period Weeks    Status New  Target Date 08/08/20      PT LONG TERM GOAL #3   Title PT to have improved flexibility in her hips and knees to  be able to don and doff her shoes and socks without difficulty.    Time 6    Period Weeks    Status New      PT LONG TERM GOAL #4   Title Pt to be able to single leg stance on both LE for 30 seconds to allow pt to feel confident walking on uneven ground.    Time 6    Period Weeks    Status New                 Plan - 07/26/20 1648    Clinical Impression Statement Patient progressing well with POC details. Continues to exhibit left knee edema and limited mechanics with difficulty perofrming symmetrical sit to stand and squat due to pain and limitation.  Demonstrates slight antalgia with gait.  Patient demonstrates good recitation of HEP activities and is motivated to increase her ROM and strength.  Continued services needed to further develop comprehensive HEP and progress with PRE and facilitate normalized body mechanics.    Personal Factors and Comorbidities Comorbidity 3+;Past/Current Experience;Time since onset of injury/illness/exacerbation    Comorbidities OA,past surgical intervention, depression, anxiety.    Examination-Activity Limitations Carry;Dressing;Lift;Locomotion Level;Stairs;Squat;Sit    Examination-Participation Restrictions Cleaning;Community Activity;Laundry;Meal Prep;Shop    Stability/Clinical Decision Making Stable/Uncomplicated    Rehab Potential Good    PT Frequency 2x / week    PT Duration 6 weeks    PT Treatment/Interventions Patient/family education;Stair training;Therapeutic activities;Therapeutic exercise;Balance training;Manual techniques;Gait training    PT Next Visit Plan Continue phase III per protocol til 07/18/20 with focus on knee mobility and strengthening.    PT Home Exercise Plan ankle pumps, qs, heelslides, SLR, heelraises and mini squat. LAQ, H/S curls, supine hip flexion with red t-loop. Squats with t-loop around knees           Patient will benefit from skilled therapeutic intervention in order to improve the following deficits and  impairments:  Pain,Decreased strength,Difficulty walking,Increased edema,Decreased range of motion,Decreased balance,Decreased activity tolerance  Visit Diagnosis: Knee stiffness, left  Difficulty in walking, not elsewhere classified  Repeated falls  Chronic pain of left knee     Problem List Patient Active Problem List   Diagnosis Date Noted  . Depression, major, single episode, moderate ([place]) 01/01/2020  . Dental caries 04/24/2019  . Obesity (BMI 30.0-34.9) 01/08/2019  . Multiple benign lumps of breast 08/17/2018  . Hemorrhoids 05/19/2018  . Encounter for contraceptive management 03/20/2018  . Low vitamin D level 11/30/2017  . Esophageal dysphagia 09/09/2017  . Bloating 03/05/2017  . Bilateral knee pain 01/05/2017  . Amenorrhea 01/05/2017  . Bilateral hip pain 07/22/2016  . Post-surgical hypothyroidism 06/02/2014  . HTN (hypertension), benign 06/30/2012  . Knee pain 06/02/2012  . Patellar malalignment syndrome 03/09/2012  . IBS (irritable bowel syndrome) 08/29/2011  . GERD (gastroesophageal reflux disease) 08/29/2011  . Back pain 09/24/2010  . Migraine headache 04/02/2010  . Constipation 09/12/2009  . Allergic rhinitis 06/16/2009    4:53 PM, 07/26/20 M. Sherlyn Lees, PT, DPT Physical Therapist- [place] Office Number: [phone]  [place] [address] [place], [place], [address] Phone: [phone]   Fax:  [phone]  Name: Martha Montgomery MRN: [id number] Date of Birth: 1981/01/15

## 2020-07-30 ENCOUNTER — Other Ambulatory Visit: Payer: Self-pay

## 2020-07-30 ENCOUNTER — Encounter: Payer: Self-pay | Admitting: Family Medicine

## 2020-07-30 ENCOUNTER — Telehealth (INDEPENDENT_AMBULATORY_CARE_PROVIDER_SITE_OTHER): Payer: Medicare Other | Admitting: Family Medicine

## 2020-07-30 VITALS — BP 132/86 | Ht 63.0 in | Wt 186.0 lb

## 2020-07-30 DIAGNOSIS — Z1159 Encounter for screening for other viral diseases: Secondary | ICD-10-CM

## 2020-07-30 DIAGNOSIS — R7989 Other specified abnormal findings of blood chemistry: Secondary | ICD-10-CM

## 2020-07-30 DIAGNOSIS — E669 Obesity, unspecified: Secondary | ICD-10-CM

## 2020-07-30 DIAGNOSIS — F321 Major depressive disorder, single episode, moderate: Secondary | ICD-10-CM

## 2020-07-30 DIAGNOSIS — I10 Essential (primary) hypertension: Secondary | ICD-10-CM

## 2020-07-30 DIAGNOSIS — M79644 Pain in right finger(s): Secondary | ICD-10-CM

## 2020-07-30 DIAGNOSIS — R1032 Left lower quadrant pain: Secondary | ICD-10-CM

## 2020-07-30 DIAGNOSIS — E890 Postprocedural hypothyroidism: Secondary | ICD-10-CM

## 2020-07-30 DIAGNOSIS — R1031 Right lower quadrant pain: Secondary | ICD-10-CM | POA: Diagnosis not present

## 2020-07-30 DIAGNOSIS — R102 Pelvic and perineal pain: Secondary | ICD-10-CM

## 2020-07-30 NOTE — Patient Instructions (Addendum)
F/U in office with Md  4 months, call if you need me sooner  Labs needed this week, TSH, vit D, chem 7 and EGFr , non fasting and hepatitis C screen  Please schedule your Covid booster due in February   You are referred toDr Aline Brochure re finger and groin pain   Congrats on 16 pound weight loss in the past 6 months, keep it up!  It is important that you exercise regularly at least 30 minutes 5 times a week. If you develop chest pain, have severe difficulty breathing, or feel very tired, stop exercising immediately and seek medical attention  Think about what you will eat, plan ahead. Choose " clean, green, fresh or frozen" over canned, processed or packaged foods which are more sugary, salty and fatty. 70 to 75% of food eaten should be vegetables and fruit. Three meals at set times with snacks allowed between meals, but they must be fruit or vegetables. Aim to eat over a 12 hour period , example 7 am to 7 pm, and STOP after  your last meal of the day. Drink water,generally about 64 ounces per day, no other drink is as healthy. Fruit juice is best enjoyed in a healthy way, by EATING the fruit. Thanks for choosing [facility], we consider it a privelige to serve you.

## 2020-07-30 NOTE — Progress Notes (Signed)
Virtual Visit via video Note  I connected with Martha Montgomery on 07/30/20 at  3:00 PM EST by telephone and verified that I am speaking with the correct person using two identifiers.  Location: Patient: home Provider: office   I discussed the limitations, risks, security and privacy concerns of performing an evaluation and management service by telephone and the availability of in person appointments. I also discussed with the patient that there may be a patient responsible charge related to this service. The patient expressed understanding and agreed to proceed.   History of Present Illness: 3 n month h/o swelling and deformity of right 4 fingers except index, wants Ortho referral Cracking of joints In groin worsening over past several years, wants Ortho eval Denies recent fever or chills. Denies sinus pressure, nasal congestion, ear pain or sore throat. Denies chest congestion, productive cough or wheezing. Denies chest pains, palpitations and leg swelling Denies abdominal pain, nausea, vomiting,diarrhea or constipation.   Denies dysuria, frequency, hesitancy or incontinence.  Denies headaches, seizures, numbness, or tingling. Denies uncontrolled depression, anxiety or insomnia. Denies skin break down or rash.       Observations/Objective: BP 132/86   Ht 5\' 3"  (1.6 m)   Wt 186 lb (84.4 kg)   BMI 32.95 kg/m  Good communication with no confusion and intact memory. Alert and oriented x 3 No signs of respiratory distress during speech    Assessment and Plan: Post-surgical hypothyroidism Updated lab  needed, over corrected when last checked  HTN (hypertension), benign Controlled, no change in medication DASH diet and commitment to daily physical activity for a minimum of 30 minutes discussed and encouraged, as a part of hypertension management. The importance of attaining a healthy weight is also discussed.  BP/Weight 07/30/2020 05/22/2020 03/27/2020 03/19/2020 03/18/2020  02/14/2020 12/29/2019  Systolic BP 132 147 148 123 - 02/28/2020 148  Diastolic BP 86 96 83 80 - 88 100  Wt. (Lbs) 186 188 189 - 195 195.8 200  BMI 32.95 33.3 33.48 34.54 - 34.68 35.43       Depression, major, single episode, moderate (HCC) Controlled, no change in medication Needs to establish with therapy  Finger pain, right Disabling, ortho to eval and manage  Bilateral groin pain several year history worsening with cracking and pelvic pain, refer ortho  Obesity (BMI 30.0-34.9)  Patient re-educated about  the importance of commitment to a  minimum of 150 minutes of exercise per week as able.  The importance of healthy food choices with portion control discussed, as well as eating regularly and within a 12 hour window most days. The need to choose "clean , green" food 50 to 75% of the time is discussed, as well as to make water the primary drink and set a goal of 64 ounces water daily.    Weight /BMI 07/30/2020 05/22/2020 03/27/2020  WEIGHT 186 lb 188 lb 189 lb  HEIGHT 5\' 3"  5\' 3"  5\' 3"   BMI 32.95 kg/m2 33.3 kg/m2 33.48 kg/m2   Improving gradually     Follow Up Instructions:    I discussed the assessment and treatment plan with the patient. The patient was provided an opportunity to ask questions and all were answered. The patient agreed with the plan and demonstrated an understanding of the instructions.   The patient was advised to call back or seek an in-person evaluation if the symptoms worsen or if the condition fails to improve as anticipated.  I provided 20 minutes of non-face-to-face time during this encounter.  Tula Nakayama, MD

## 2020-07-31 ENCOUNTER — Encounter (HOSPITAL_COMMUNITY): Payer: Self-pay

## 2020-07-31 ENCOUNTER — Ambulatory Visit (HOSPITAL_COMMUNITY): Payer: Medicare Other | Attending: Sports Medicine

## 2020-07-31 ENCOUNTER — Other Ambulatory Visit: Payer: Self-pay

## 2020-07-31 DIAGNOSIS — M79644 Pain in right finger(s): Secondary | ICD-10-CM | POA: Insufficient documentation

## 2020-07-31 DIAGNOSIS — G8929 Other chronic pain: Secondary | ICD-10-CM | POA: Insufficient documentation

## 2020-07-31 DIAGNOSIS — M25662 Stiffness of left knee, not elsewhere classified: Secondary | ICD-10-CM | POA: Diagnosis not present

## 2020-07-31 DIAGNOSIS — R262 Difficulty in walking, not elsewhere classified: Secondary | ICD-10-CM | POA: Diagnosis not present

## 2020-07-31 DIAGNOSIS — M25562 Pain in left knee: Secondary | ICD-10-CM | POA: Diagnosis not present

## 2020-07-31 DIAGNOSIS — R296 Repeated falls: Secondary | ICD-10-CM | POA: Insufficient documentation

## 2020-07-31 DIAGNOSIS — R1031 Right lower quadrant pain: Secondary | ICD-10-CM | POA: Insufficient documentation

## 2020-07-31 MED ORDER — TRIAMTERENE-HCTZ 75-50 MG PO TABS: 1.0000 | ORAL_TABLET | Freq: Every day | ORAL | 1 refills | Status: DC

## 2020-07-31 NOTE — Assessment & Plan Note (Signed)
Controlled, no change in medication Needs to establish with therapy

## 2020-07-31 NOTE — Assessment & Plan Note (Signed)
  Patient re-educated about  the importance of commitment to a  minimum of 150 minutes of exercise per week as able.  The importance of healthy food choices with portion control discussed, as well as eating regularly and within a 12 hour window most days. The need to choose "clean , green" food 50 to 75% of the time is discussed, as well as to make water the primary drink and set a goal of 64 ounces water daily.    Weight /BMI 07/30/2020 05/22/2020 03/27/2020  WEIGHT 186 lb 188 lb 189 lb  HEIGHT 5\' 3"  5\' 3"  5\' 3"   BMI 32.95 kg/m2 33.3 kg/m2 33.48 kg/m2   Improving gradually

## 2020-07-31 NOTE — Therapy (Signed)
[facility] [place], [place], [address] Phone: [phone]   Fax:  [phone]  Physical Therapy Treatment  Patient Details  Name: Martha Montgomery MRN: [id number] Date of Birth: [birth year] Referring Provider (PT): Douglass Rivers   Encounter Date: 07/31/2020   PT End of Session - 07/31/20 1727    Visit Number 6    Number of Visits 12    Date for PT Re-Evaluation 08/08/20    Authorization Type UHC Medicare    Progress Note Due on Visit 10    PT Start Time 1720   Pt late for apt   PT Stop Time 1748    PT Time Calculation (min) 28 min    Activity Tolerance Patient tolerated treatment well    Behavior During Therapy [facility] for tasks assessed/performed           Past Medical History:  Diagnosis Date   Anxiety    Anxiety disorder    Arthritis    "knees, joints, hands, toes" (09/28/2013)   Depression    Depression, major, single episode, in partial remission ([place]) 10/18/2008   Qualifier: Diagnosis of  By: Moshe Cipro MD, Margaret  PHQ 9 score of 7 in 10/2017, not suicidal or homicidal, interested in telepsych however, score of 16 in  02/2018, not suicidal or homicidal, start medication and refer to telepsych Score of 7 in 07/2018    Fibromyalgia    Gastritis nov. 2011   EGD by Dr. Oneida Alar, negative h pylori   GERD (gastroesophageal reflux disease)    Gout    Hypertension x 6 years    Hypothyroidism    Migraines    "15/month average" (09/28/2013)   Spinal stenosis    Thyroid goiter     Past Surgical History:  Procedure Laterality Date   BIOPSY  10/20/2017   Procedure: BIOPSY;  Surgeon: Danie Binder, MD;  [facility];  Service: Endoscopy;;  gastric duodenal esophagus   CESAREAN SECTION  2008   CHONDROPLASTY  04/02/2012   Procedure: CHONDROPLASTY;  Surgeon: Carole Civil, MD;  Location: [facility];  Service: Orthopedics;  Laterality: Left;  of left patella   COLONOSCOPY  May 2012   Dr. Oneida Alar:  normal colon, small internal hemorrhoids   COLONOSCOPY WITH PROPOFOL N/A 10/22/2017   Procedure: COLONOSCOPY WITH PROPOFOL;  Surgeon: Danie Binder, MD;  [facility];  Service: Endoscopy;  Laterality: N/A;  7:30am   COLONOSCOPY WITH PROPOFOL N/A 10/20/2017   Procedure: COLONOSCOPY WITH PROPOFOL;  Surgeon: Danie Binder, MD;  [facility];  Service: Endoscopy;  Laterality: N/A;  11:00am   ESOPHAGOGASTRODUODENOSCOPY (EGD) WITH PROPOFOL N/A 10/20/2017   Procedure: ESOPHAGOGASTRODUODENOSCOPY (EGD) WITH PROPOFOL;  Surgeon: Danie Binder, MD;  [facility];  Service: Endoscopy;  Laterality: N/A;   INCISION AND DRAINAGE ABSCESS  1997; 2005   "under right buttocks; under left arm"   SAVORY DILATION N/A 10/20/2017   Procedure: SAVORY DILATION;  Surgeon: Danie Binder, MD;  [facility];  Service: Endoscopy;  Laterality: N/A;   THYROIDECTOMY N/A 09/28/2013   Procedure: TOTAL THYROIDECTOMY;  Surgeon: Ascencion Dike, MD;  Location: [facility] OR;  Service: ENT;  Laterality: N/A;   TOTAL THYROIDECTOMY  09/27/2013   UPPER GI ENDOSCOPY  2011   EGD: gastritis, no h.pylori    There were no vitals filed for this visit.   Subjective Assessment - 07/31/20 1726    Subjective Pt late for apt,  reports she has been on feet all day.  Reports compliance wiht HEP, difficuly wiht time/school/family.    Pertinent History anxiety, OA, depression, back pain    Patient Stated Goals Full range of motion with her knee, some reduction of pain, able to walk longer, stronger.    Currently in Pain? Yes    Pain Score 5     Pain Location Knee    Pain Orientation Left    Pain Descriptors / Indicators Sore    Pain Type Acute pain    Pain Onset More than a month ago    Pain Frequency Constant    Aggravating Factors  morning, nights    Pain Relieving Factors meds but is taking less    Effect of Pain on Daily Activities limits                             [facility]  Adult PT Treatment/Exercise - 07/31/20 0001      Exercises   Exercises Knee/Hip      Knee/Hip Exercises: Standing   Heel Raises 15 reps    Heel Raises Limitations down on Lt only    Forward Lunges Both;15 reps;Limitations    Forward Lunges Limitations onto 4" step no UE's    Lateral Step Up 15 reps;Hand Hold: 1;Left;Step Height: 6"    Forward Step Up 15 reps;Hand Hold: 0;Left;Step Height: 6"    Forward Step Up Limitations knee drive    Functional Squat 15 reps    Other Standing Knee Exercises towel crunch 1x each foot                    PT Short Term Goals - 06/27/20 1626      PT SHORT TERM GOAL #1   Title PT pain to be no greater than a 5/10 to allow pt to walk for 30 minutes without crutches while in brace. .    Time 3    Period Weeks    Status New    Target Date 07/18/20      PT SHORT TERM GOAL #2   Title Pt to be using heel to toe  gait mechanics with equal stride to improve knee pain.    Time 3    Period Weeks    Status New      PT SHORT TERM GOAL #3   Title PT LT knee flexion to be to 110 degrees to allow pt to be comfortable sitting for an hour    Time 3    Period Weeks    Status New             PT Long Term Goals - 06/27/20 1629      PT LONG TERM GOAL #1   Title Pt pain to be no greater than a 3/10 to allow pt to walk for 60 minutes to return to walking for exercise as well as to be able to complete shopping tasks.    Time 6    Period Weeks    Status New    Target Date 08/08/20      PT LONG TERM GOAL #2   Title PT core and LE strength to be at least 4+/5 to allow pt to be able to stand up from a squatted postion without difficulty.    Time 6    Period Weeks    Status New    Target Date 08/08/20      PT  LONG TERM GOAL #3   Title PT to have improved flexibility in her hips and knees to be able to don and doff her shoes and socks without difficulty.    Time 6    Period Weeks    Status New      PT LONG TERM GOAL #4   Title Pt to be able  to single leg stance on both LE for 30 seconds to allow pt to feel confident walking on uneven ground.    Time 6    Period Weeks    Status New                 Plan - 07/31/20 1755    Clinical Impression Statement Continued session focus per protocol, will be 10 weeks tomorrow.  Pt continues to exhibit pain and uneven weight bearing during sit to stands and squats, verbal, visual and tactile cueing to improve mechanics.  Noted decreased arch BLE, pt educated on importance of buying supportive shoes, intrinsic strengthening and possible arch support in shoes to improve knee alignment and reduce knee valgus for pain control    Personal Factors and Comorbidities Comorbidity 3+;Past/Current Experience;Time since onset of injury/illness/exacerbation    Comorbidities OA,past surgical intervention, depression, anxiety.    Examination-Activity Limitations Carry;Dressing;Lift;Locomotion Level;Stairs;Squat;Sit    Examination-Participation Restrictions Cleaning;Community Activity;Laundry;Meal Prep;Shop    Stability/Clinical Decision Making Stable/Uncomplicated    Clinical Decision Making Low    Rehab Potential Good    PT Frequency 2x / week    PT Duration 6 weeks    PT Treatment/Interventions Patient/family education;Stair training;Therapeutic activities;Therapeutic exercise;Balance training;Manual techniques;Gait training    PT Next Visit Plan Pt at 10 weeks 08/02/19 phase IV continue focus on knee mobility and strengthening.    PT Home Exercise Plan ankle pumps, qs, heelslides, SLR, heelraises and mini squat. LAQ, H/S curls, supine hip flexion with red t-loop. Squats with t-loop around knees; 08/01/19: toe crunch standing           Patient will benefit from skilled therapeutic intervention in order to improve the following deficits and impairments:  Pain,Decreased strength,Difficulty walking,Increased edema,Decreased range of motion,Decreased balance,Decreased activity tolerance  Visit  Diagnosis: Knee stiffness, left  Difficulty in walking, not elsewhere classified  Repeated falls  Chronic pain of left knee     Problem List Patient Active Problem List   Diagnosis Date Noted   Finger pain, right 07/31/2020   Bilateral groin pain 07/31/2020   Depression, major, single episode, moderate ([place]) 01/01/2020   Dental caries 04/24/2019   Obesity (BMI 30.0-34.9) 01/08/2019   Multiple benign lumps of breast 08/17/2018   Hemorrhoids 05/19/2018   Encounter for contraceptive management 03/20/2018   Low vitamin D level 11/30/2017   Esophageal dysphagia 09/09/2017   Bloating 03/05/2017   Bilateral knee pain 01/05/2017   Amenorrhea 01/05/2017   Bilateral hip pain 07/22/2016   Post-surgical hypothyroidism 06/02/2014   HTN (hypertension), benign 06/30/2012   Knee pain 06/02/2012   Patellar malalignment syndrome 03/09/2012   IBS (irritable bowel syndrome) 08/29/2011   GERD (gastroesophageal reflux disease) 08/29/2011   Back pain 09/24/2010   Migraine headache 04/02/2010   Constipation 09/12/2009   Allergic rhinitis 06/16/2009   Ihor Austin, LPTA/CLT; CBIS [phone]  Aldona Lento 07/31/2020, 6:47 PM  [place] [address] [place], [place], [address] Phone: [phone]   Fax:  [phone]  Name: Martha Montgomery MRN: [id number] Date of Birth: [birth year]

## 2020-07-31 NOTE — Assessment & Plan Note (Signed)
Disabling, ortho to eval and manage

## 2020-07-31 NOTE — Assessment & Plan Note (Signed)
several year history worsening with cracking and pelvic pain, refer ortho

## 2020-07-31 NOTE — Assessment & Plan Note (Signed)
Updated lab needed, over corrected when last checked 

## 2020-07-31 NOTE — Assessment & Plan Note (Signed)
Controlled, no change in medication DASH diet and commitment to daily physical activity for a minimum of 30 minutes discussed and encouraged, as a part of hypertension management. The importance of attaining a healthy weight is also discussed.  BP/Weight 07/30/2020 05/22/2020 03/27/2020 03/19/2020 03/18/2020 02/14/2020 12/29/2019  Systolic BP 132 147 148 123 - 591 148  Diastolic BP 86 96 83 80 - 88 100  Wt. (Lbs) 186 188 189 - 195 195.8 200  BMI 32.95 33.3 33.48 34.54 - 34.68 35.43

## 2020-08-01 ENCOUNTER — Other Ambulatory Visit: Payer: Self-pay | Admitting: Family Medicine

## 2020-08-02 ENCOUNTER — Ambulatory Visit (HOSPITAL_COMMUNITY): Payer: Medicare Other

## 2020-08-02 ENCOUNTER — Encounter (HOSPITAL_COMMUNITY): Payer: Self-pay

## 2020-08-02 ENCOUNTER — Other Ambulatory Visit: Payer: Self-pay

## 2020-08-02 DIAGNOSIS — M25562 Pain in left knee: Secondary | ICD-10-CM | POA: Diagnosis not present

## 2020-08-02 DIAGNOSIS — M25662 Stiffness of left knee, not elsewhere classified: Secondary | ICD-10-CM

## 2020-08-02 DIAGNOSIS — G8929 Other chronic pain: Secondary | ICD-10-CM | POA: Diagnosis not present

## 2020-08-02 DIAGNOSIS — R296 Repeated falls: Secondary | ICD-10-CM | POA: Diagnosis not present

## 2020-08-02 DIAGNOSIS — R262 Difficulty in walking, not elsewhere classified: Secondary | ICD-10-CM

## 2020-08-02 NOTE — Therapy (Signed)
[facility] [address] [place], [place], [address] Phone: [phone]   Fax:  [phone]  Physical Therapy Treatment  Patient Details  Name: Martha Montgomery R Lafalce MRN: [id number] Date of Birth: 11/06/1980 Referring Provider (PT): Floria RavelingBrian Waterman   Encounter Date: 08/02/2020   PT End of Session - 08/02/20 1336    Visit Number 7    Number of Visits 12    Date for PT Re-Evaluation 08/08/20    Authorization Type [employer] Medicare    Progress Note Due on Visit 10    PT Start Time 1329   pt late for apt   PT Stop Time 1358    PT Time Calculation (min) 29 min    Activity Tolerance Patient tolerated treatment well    Behavior During Therapy [facility] for tasks assessed/performed           Past Medical History:  Diagnosis Date  . Anxiety   . Anxiety disorder   . Arthritis    "knees, joints, hands, toes" (09/28/2013)  . Depression   . Depression, major, single episode, in partial remission (HCC) 10/18/2008   Qualifier: Diagnosis of  By: Lodema HongSimpson MD, Margaret  PHQ 9 score of 7 in 10/2017, not suicidal or homicidal, interested in telepsych however, score of 16 in  02/2018, not suicidal or homicidal, start medication and refer to telepsych Score of 7 in 07/2018   . Fibromyalgia   . Gastritis nov. 2011   EGD by Dr. Darrick PennaFields, negative h pylori  . GERD (gastroesophageal reflux disease)   . Gout   . Hypertension x 6 years   . Hypothyroidism   . Migraines    "15/month average" (09/28/2013)  . Spinal stenosis   . Thyroid goiter     Past Surgical History:  Procedure Laterality Date  . BIOPSY  10/20/2017   Procedure: BIOPSY;  Surgeon: West BaliFields, Sandi L, MD;  Location: [facility];  Service: Endoscopy;;  gastric duodenal esophagus  . CESAREAN SECTION  2008  . CHONDROPLASTY  04/02/2012   Procedure: CHONDROPLASTY;  Surgeon: Vickki HearingStanley E Harrison, MD;  Location: [facility];  Service: Orthopedics;  Laterality: Left;  of left patella  . COLONOSCOPY  May 2012   Dr. Darrick PennaFields:  normal colon, small internal hemorrhoids  . COLONOSCOPY WITH PROPOFOL N/A 10/22/2017   Procedure: COLONOSCOPY WITH PROPOFOL;  Surgeon: West BaliFields, Sandi L, MD;  Location: [facility];  Service: Endoscopy;  Laterality: N/A;  7:30am  . COLONOSCOPY WITH PROPOFOL N/A 10/20/2017   Procedure: COLONOSCOPY WITH PROPOFOL;  Surgeon: West BaliFields, Sandi L, MD;  [facility]: [facility];  Service: Endoscopy;  Laterality: N/A;  11:00am  . ESOPHAGOGASTRODUODENOSCOPY (EGD) WITH PROPOFOL N/A 10/20/2017   Procedure: ESOPHAGOGASTRODUODENOSCOPY (EGD) WITH PROPOFOL;  Surgeon: West BaliFields, Sandi L, MD;  [facility]: [facility];  Service: Endoscopy;  Laterality: N/A;  . INCISION AND DRAINAGE ABSCESS  1997; 2005   "under right buttocks; under left arm"  . SAVORY DILATION N/A 10/20/2017   Procedure: SAVORY DILATION;  Surgeon: West BaliFields, Sandi L, MD;  Location: [facility];  Service: Endoscopy;  Laterality: N/A;  . THYROIDECTOMY N/A 09/28/2013   Procedure: TOTAL THYROIDECTOMY;  Surgeon: Darletta MollSui W Teoh, MD;  [facility];  Service: ENT;  Laterality: N/A;  . TOTAL THYROIDECTOMY  09/27/2013  . UPPER GI ENDOSCOPY  2011   EGD: gastritis, no h.pylori    There were no vitals filed for this visit.   Subjective Assessment - 08/02/20 1332    Subjective Pt late for apt  today.  Reports knee is feeling better today, pain scale 3/10.  Reports she has difficulty with lot of apts wiht family and school.                             [facility] PT Treatment/Exercise - 08/02/20 0001      Exercises   Exercises Knee/Hip      Knee/Hip Exercises: Standing   Heel Raises 15 reps    Heel Raises Limitations down on Lt only    Forward Lunges Both;15 reps;Limitations    Forward Lunges Limitations reduced height wiht good mechanics following initial instructions    Lateral Step Up 20 reps;Hand Hold: 1;Step Height: 6"    Forward Step Up 15 reps;Hand Hold: 0;Left;Step Height: 6"    Forward Step Up Limitations knee drive    Functional Squat  15 reps    Functional Squat Limitations mirror and chair behind wiht improved mechanics    Other Standing Knee Exercises lateral step 2RT GTB around thigh    Other Standing Knee Exercises monster walk 1Rt in minisquat position.                  PT Education - 08/02/20 1334    Education Details Discussed importance of arriving on time for full benefits with therapy    Person(s) Educated Patient    Methods Explanation    Comprehension Verbalized understanding            PT Short Term Goals - 06/27/20 1626      PT SHORT TERM GOAL #1   Title PT pain to be no greater than a 5/10 to allow pt to walk for 30 minutes without crutches while in brace. .    Time 3    Period Weeks    Status New    Target Date 07/18/20      PT SHORT TERM GOAL #2   Title Pt to be using heel to toe  gait mechanics with equal stride to improve knee pain.    Time 3    Period Weeks    Status New      PT SHORT TERM GOAL #3   Title PT LT knee flexion to be to 110 degrees to allow pt to be comfortable sitting for an hour    Time 3    Period Weeks    Status New             PT Long Term Goals - 06/27/20 1629      PT LONG TERM GOAL #1   Title Pt pain to be no greater than a 3/10 to allow pt to walk for 60 minutes to return to walking for exercise as well as to be able to complete shopping tasks.    Time 6    Period Weeks    Status New    Target Date 08/08/20      PT LONG TERM GOAL #2   Title PT core and LE strength to be at least 4+/5 to allow pt to be able to stand up from a squatted postion without difficulty.    Time 6    Period Weeks    Status New    Target Date 08/08/20      PT LONG TERM GOAL #3   Title PT to have improved flexibility in her hips and knees to be able to don and doff her shoes and socks without difficulty.  Time 6    Period Weeks    Status New      PT LONG TERM GOAL #4   Title Pt to be able to single leg stance on both LE for 30 seconds to allow pt to feel  confident walking on uneven ground.    Time 6    Period Weeks    Status New                 Plan - 08/02/20 1351    Clinical Impression Statement Session focus iwht functional strengthening per protocol at 10 week post-op.  Pt presents with improve mechanics wiht squats following verbal cueing and visual feedback with mirror.  Reduced height with lunges this session for increased weight bearing, tolerated well though does report some soreness following.  Added sidestep and monster walking for gluteal strengthening, tolerated well.  Pt was late again for session, encouraged to schedule apt times that allow her to get full session and arrive on time wiht verbalized understanding.    Personal Factors and Comorbidities Comorbidity 3+;Past/Current Experience;Time since onset of injury/illness/exacerbation    Comorbidities OA,past surgical intervention, depression, anxiety.    Examination-Activity Limitations Carry;Dressing;Lift;Locomotion Level;Stairs;Squat;Sit    Examination-Participation Restrictions Cleaning;Community Activity;Laundry;Meal Prep;Shop    Stability/Clinical Decision Making Stable/Uncomplicated    Clinical Decision Making Low    Rehab Potential Good    PT Frequency 2x / week    PT Duration 6 weeks    PT Treatment/Interventions Patient/family education;Stair training;Therapeutic activities;Therapeutic exercise;Balance training;Manual techniques;Gait training    PT Next Visit Plan Pt at 10 weeks 08/02/19 phase IV continue focus on knee mobility and strengthening.    PT Home Exercise Plan ankle pumps, qs, heelslides, SLR, heelraises and mini squat. LAQ, H/S curls, supine hip flexion with red t-loop. Squats with t-loop around knees; 08/01/19: toe crunch standing           Patient will benefit from skilled therapeutic intervention in order to improve the following deficits and impairments:  Pain,Decreased strength,Difficulty walking,Increased edema,Decreased range of  motion,Decreased balance,Decreased activity tolerance  Visit Diagnosis: Difficulty in walking, not elsewhere classified  Knee stiffness, left  Repeated falls  Chronic pain of left knee     Problem List Patient Active Problem List   Diagnosis Date Noted  . Finger pain, right 07/31/2020  . Bilateral groin pain 07/31/2020  . Depression, major, single episode, moderate (HCC) 01/01/2020  . Dental caries 04/24/2019  . Obesity (BMI 30.0-34.9) 01/08/2019  . Multiple benign lumps of breast 08/17/2018  . Hemorrhoids 05/19/2018  . Encounter for contraceptive management 03/20/2018  . Low vitamin D level 11/30/2017  . Esophageal dysphagia 09/09/2017  . Bloating 03/05/2017  . Bilateral knee pain 01/05/2017  . Amenorrhea 01/05/2017  . Bilateral hip pain 07/22/2016  . Post-surgical hypothyroidism 06/02/2014  . HTN (hypertension), benign 06/30/2012  . Knee pain 06/02/2012  . Patellar malalignment syndrome 03/09/2012  . IBS (irritable bowel syndrome) 08/29/2011  . GERD (gastroesophageal reflux disease) 08/29/2011  . Back pain 09/24/2010  . Migraine headache 04/02/2010  . Constipation 09/12/2009  . Allergic rhinitis 06/16/2009   Becky Sax, LPTA/CLT; CBIS [phone]  Juel Burrow 08/02/2020, 2:00 PM  [facility] [address] [place], [place], [address] Phone: [phone]   Fax:  [phone]  Name: GLENN CHRISTO MRN: [id number] Date of Birth: [birth year]

## 2020-08-03 ENCOUNTER — Encounter: Payer: Self-pay | Admitting: Orthopedic Surgery

## 2020-08-07 ENCOUNTER — Ambulatory Visit: Payer: Medicare Other

## 2020-08-08 ENCOUNTER — Telehealth (HOSPITAL_COMMUNITY): Payer: Self-pay | Admitting: Physical Therapy

## 2020-08-08 ENCOUNTER — Ambulatory Visit (HOSPITAL_COMMUNITY): Payer: Medicare Other | Admitting: Physical Therapy

## 2020-08-08 NOTE — Telephone Encounter (Signed)
She is sick and will not be here today - she might get a covid test- pateint will let us know about Friday's appointment later.

## 2020-08-09 ENCOUNTER — Other Ambulatory Visit: Payer: Medicare Other

## 2020-08-09 DIAGNOSIS — Z20822 Contact with and (suspected) exposure to covid-19: Secondary | ICD-10-CM | POA: Diagnosis not present

## 2020-08-10 ENCOUNTER — Telehealth (HOSPITAL_COMMUNITY): Payer: Self-pay

## 2020-08-10 ENCOUNTER — Ambulatory Visit (HOSPITAL_COMMUNITY): Payer: Medicare Other

## 2020-08-10 NOTE — Telephone Encounter (Signed)
pt called to cx her appt today due to she will not get her covid test results back untill next week

## 2020-08-12 LAB — NOVEL CORONAVIRUS, NAA: SARS-CoV-2, NAA: DETECTED — AB

## 2020-08-13 ENCOUNTER — Encounter: Payer: Self-pay | Admitting: Physician Assistant

## 2020-08-13 ENCOUNTER — Telehealth: Payer: Self-pay | Admitting: Physician Assistant

## 2020-08-13 DIAGNOSIS — I10 Essential (primary) hypertension: Secondary | ICD-10-CM | POA: Insufficient documentation

## 2020-08-13 NOTE — Telephone Encounter (Signed)
Called to discuss with Martha Montgomery about Covid symptoms and the use of sotrovimab, remdisivir or oral therapies for those with mild to moderate Covid symptoms and at a high risk of hospitalization.     Pt is qualified, but due to medication shortages we cannot offer the pt the infusion at this time. This decision was based on clinical judgement and the NIH Covid 19 treatment guidelines for treatment prioritization and equitable access. Pt is on day 8 of symptoms. Symptoms tier reviewed as well as criteria for ending isolation. Preventative practices reviewed. Patient verbalized understanding   Patient Active Problem List   Diagnosis Date Noted  . Hypertension   . Finger pain, right 07/31/2020  . Bilateral groin pain 07/31/2020  . Depression, major, single episode, moderate ([place]) 01/01/2020  . Dental caries 04/24/2019  . Obesity (BMI 30.0-34.9) 01/08/2019  . Multiple benign lumps of breast 08/17/2018  . Hemorrhoids 05/19/2018  . Encounter for contraceptive management 03/20/2018  . Low vitamin D level 11/30/2017  . Esophageal dysphagia 09/09/2017  . Bloating 03/05/2017  . Bilateral knee pain 01/05/2017  . Amenorrhea 01/05/2017  . Bilateral hip pain 07/22/2016  . Post-surgical hypothyroidism 06/02/2014  . HTN (hypertension), benign 06/30/2012  . Knee pain 06/02/2012  . Patellar malalignment syndrome 03/09/2012  . IBS (irritable bowel syndrome) 08/29/2011  . GERD (gastroesophageal reflux disease) 08/29/2011  . Back pain 09/24/2010  . Migraine headache 04/02/2010  . Constipation 09/12/2009  . Allergic rhinitis 06/16/2009  . Depression, major, single episode, in partial remission ([place]) 10/18/2008    Angelena Form PA-C

## 2020-08-17 ENCOUNTER — Ambulatory Visit: Payer: Medicare Other

## 2020-08-23 DIAGNOSIS — M25852 Other specified joint disorders, left hip: Secondary | ICD-10-CM | POA: Diagnosis not present

## 2020-08-23 DIAGNOSIS — Z9889 Other specified postprocedural states: Secondary | ICD-10-CM | POA: Diagnosis not present

## 2020-08-23 DIAGNOSIS — Z4789 Encounter for other orthopedic aftercare: Secondary | ICD-10-CM | POA: Diagnosis not present

## 2020-08-23 DIAGNOSIS — M25851 Other specified joint disorders, right hip: Secondary | ICD-10-CM | POA: Diagnosis not present

## 2020-08-23 DIAGNOSIS — M25362 Other instability, left knee: Secondary | ICD-10-CM | POA: Diagnosis not present

## 2020-08-23 DIAGNOSIS — M25361 Other instability, right knee: Secondary | ICD-10-CM | POA: Diagnosis not present

## 2020-08-30 ENCOUNTER — Telehealth (INDEPENDENT_AMBULATORY_CARE_PROVIDER_SITE_OTHER): Payer: Medicare Other

## 2020-08-30 ENCOUNTER — Other Ambulatory Visit: Payer: Self-pay

## 2020-08-30 VITALS — BP 115/87 | Ht 63.0 in | Wt 180.0 lb

## 2020-08-30 DIAGNOSIS — Z0000 Encounter for general adult medical examination without abnormal findings: Secondary | ICD-10-CM

## 2020-08-30 DIAGNOSIS — Z0100 Encounter for examination of eyes and vision without abnormal findings: Secondary | ICD-10-CM | POA: Diagnosis not present

## 2020-08-30 NOTE — Patient Instructions (Signed)
Martha Montgomery , Thank you for taking time to come for your Medicare Wellness Visit. I appreciate your ongoing commitment to your health goals. Please review the following plan we discussed and let me know if I can assist you in the future.   Screening recommendations/referrals: Colonoscopy: n/a Mammogram: n/a Bone Density: n/a Recommended yearly ophthalmology/optometry visit for glaucoma screening and checkup Recommended yearly dental visit for hygiene and checkup  Vaccinations: Influenza vaccine: up to date Pneumococcal vaccine: n/a Tdap vaccine: up to date Shingles vaccine: n/a      Next appointment: wellness in 1 year  Preventive Care 40-64 Years, Female Preventive care refers to lifestyle choices and visits with your health care provider that can promote health and wellness. What does preventive care include?  A yearly physical exam. This is also called an annual well check.  Dental exams once or twice a year.  Routine eye exams. Ask your health care provider how often you should have your eyes checked.  Personal lifestyle choices, including:  Daily care of your teeth and gums.  Regular physical activity.  Eating a healthy diet.  Avoiding tobacco and drug use.  Limiting alcohol use.  Practicing safe sex.  Taking low-dose aspirin daily starting at age 46.  Taking vitamin and mineral supplements as recommended by your health care provider. What happens during an annual well check? The services and screenings done by your health care provider during your annual well check will depend on your age, overall health, lifestyle risk factors, and family history of disease. Counseling  Your health care provider may ask you questions about your:  Alcohol use.  Tobacco use.  Drug use.  Emotional well-being.  Home and relationship well-being.  Sexual activity.  Eating habits.  Work and work Statistician.  Method of birth control.  Menstrual  cycle.  Pregnancy history. Screening  You may have the following tests or measurements:  Height, weight, and BMI.  Blood pressure.  Lipid and cholesterol levels. These may be checked every 5 years, or more frequently if you are over 64 years old.  Skin check.  Lung cancer screening. You may have this screening every year starting at age 44 if you have a 30-pack-year history of smoking and currently smoke or have quit within the past 15 years.  Fecal occult blood test (FOBT) of the stool. You may have this test every year starting at age 66.  Flexible sigmoidoscopy or colonoscopy. You may have a sigmoidoscopy every 5 years or a colonoscopy every 10 years starting at age 21.  Hepatitis C blood test.  Hepatitis B blood test.  Sexually transmitted disease (STD) testing.  Diabetes screening. This is done by checking your blood sugar (glucose) after you have not eaten for a while (fasting). You may have this done every 1-3 years.  Mammogram. This may be done every 1-2 years. Talk to your health care provider about when you should start having regular mammograms. This may depend on whether you have a family history of breast cancer.  BRCA-related cancer screening. This may be done if you have a family history of breast, ovarian, tubal, or peritoneal cancers.  Pelvic exam and Pap test. This may be done every 3 years starting at age 8. Starting at age 64, this may be done every 5 years if you have a Pap test in combination with an HPV test.  Bone density scan. This is done to screen for osteoporosis. You may have this scan if you are at high risk for  osteoporosis. Discuss your test results, treatment options, and if necessary, the need for more tests with your health care provider. Vaccines  Your health care provider may recommend certain vaccines, such as:  Influenza vaccine. This is recommended every year.  Tetanus, diphtheria, and acellular pertussis (Tdap, Td) vaccine. You may  need a Td booster every 10 years.  Zoster vaccine. You may need this after age 37.  Pneumococcal 13-valent conjugate (PCV13) vaccine. You may need this if you have certain conditions and were not previously vaccinated.  Pneumococcal polysaccharide (PPSV23) vaccine. You may need one or two doses if you smoke cigarettes or if you have certain conditions. Talk to your health care provider about which screenings and vaccines you need and how often you need them. This information is not intended to replace advice given to you by your health care provider. Make sure you discuss any questions you have with your health care provider. Document Released: 08/10/2015 Document Revised: 04/02/2016 Document Reviewed: 05/15/2015 Elsevier Interactive Patient Education  2017 [employer] Prevention in the Home Falls can cause injuries. They can happen to people of all ages. There are many things you can do to make your home safe and to help prevent falls. What can I do on the outside of my home?  Regularly fix the edges of walkways and driveways and fix any cracks.  Remove anything that might make you trip as you walk through a door, such as a raised step or threshold.  Trim any bushes or trees on the path to your home.  Use bright outdoor lighting.  Clear any walking paths of anything that might make someone trip, such as rocks or tools.  Regularly check to see if handrails are loose or broken. Make sure that both sides of any steps have handrails.  Any raised decks and porches should have guardrails on the edges.  Have any leaves, snow, or ice cleared regularly.  Use sand or salt on walking paths during winter.  Clean up any spills in your garage right away. This includes oil or grease spills. What can I do in the bathroom?  Use night lights.  Install grab bars by the toilet and in the tub and shower. Do not use towel bars as grab bars.  Use non-skid mats or decals in the tub or  shower.  If you need to sit down in the shower, use a plastic, non-slip stool.  Keep the floor dry. Clean up any water that spills on the floor as soon as it happens.  Remove soap buildup in the tub or shower regularly.  Attach bath mats securely with double-sided non-slip rug tape.  Do not have throw rugs and other things on the floor that can make you trip. What can I do in the bedroom?  Use night lights.  Make sure that you have a light by your bed that is easy to reach.  Do not use any sheets or blankets that are too big for your bed. They should not hang down onto the floor.  Have a firm chair that has side arms. You can use this for support while you get dressed.  Do not have throw rugs and other things on the floor that can make you trip. What can I do in the kitchen?  Clean up any spills right away.  Avoid walking on wet floors.  Keep items that you use a lot in easy-to-reach places.  If you need to reach something above you,  use a strong step stool that has a grab bar.  Keep electrical cords out of the way.  Do not use floor polish or wax that makes floors slippery. If you must use wax, use non-skid floor wax.  Do not have throw rugs and other things on the floor that can make you trip. What can I do with my stairs?  Do not leave any items on the stairs.  Make sure that there are handrails on both sides of the stairs and use them. Fix handrails that are broken or loose. Make sure that handrails are as long as the stairways.  Check any carpeting to make sure that it is firmly attached to the stairs. Fix any carpet that is loose or worn.  Avoid having throw rugs at the top or bottom of the stairs. If you do have throw rugs, attach them to the floor with carpet tape.  Make sure that you have a light switch at the top of the stairs and the bottom of the stairs. If you do not have them, ask someone to add them for you. What else can I do to help prevent  falls?  Wear shoes that:  Do not have high heels.  Have rubber bottoms.  Are comfortable and fit you well.  Are closed at the toe. Do not wear sandals.  If you use a stepladder:  Make sure that it is fully opened. Do not climb a closed stepladder.  Make sure that both sides of the stepladder are locked into place.  Ask someone to hold it for you, if possible.  Clearly mark and make sure that you can see:  Any grab bars or handrails.  First and last steps.  Where the edge of each step is.  Use tools that help you move around (mobility aids) if they are needed. These include:  Canes.  Walkers.  Scooters.  Crutches.  Turn on the lights when you go into a dark area. Replace any light bulbs as soon as they burn out.  Set up your furniture so you have a clear path. Avoid moving your furniture around.  If any of your floors are uneven, fix them.  If there are any pets around you, be aware of where they are.  Review your medicines with your doctor. Some medicines can make you feel dizzy. This can increase your chance of falling. Ask your doctor what other things that you can do to help prevent falls. This information is not intended to replace advice given to you by your health care provider. Make sure you discuss any questions you have with your health care provider. Document Released: 05/10/2009 Document Revised: 12/20/2015 Document Reviewed: 08/18/2014 Elsevier Interactive Patient Education  2017 [employer].

## 2020-08-30 NOTE — Progress Notes (Signed)
Subjective:   Martha Montgomery is a 40 y.o. female who presents for Medicare Annual (Subsequent) preventive examination.  Review of Systems     Cardiac Risk Factors include: hypertension;obesity (BMI >30kg/m2);smoking/ tobacco exposure     Objective:    Today's Vitals   08/30/20 1559  BP: 115/87  Weight: 180 lb (81.6 kg)  Height: 5\' 3"  (1.6 m)  PainSc: 4    Body mass index is 31.89 kg/m.  Advanced Directives 06/27/2020 03/18/2020 08/12/2018 06/08/2018 05/10/2018 12/30/2017 10/22/2017  Does Patient Have a Medical Advance Directive? No No Yes No No No No  Does patient want to make changes to medical advance directive? - - Yes (ED - Information included in AVS) - - - -  Would patient like information on creating a medical advance directive? No - Patient declined No - Patient declined - - - No - Patient declined No - Patient declined  Pre-existing out of facility DNR order (yellow form or pink MOST form) - - - - - - -    Current Medications (verified) Outpatient Encounter Medications as of 08/30/2020  Medication Sig  . amLODipine (NORVASC) 10 MG tablet TAKE 1 TABLET(10 MG) BY MOUTH DAILY  . buPROPion (WELLBUTRIN XL) 300 MG 24 hr tablet TAKE 1 TABLET(300 MG) BY MOUTH DAILY  . fluticasone (FLONASE) 50 MCG/ACT nasal spray Place 1 spray into both nostrils daily as needed for allergies or rhinitis.  Marland Kitchen levothyroxine (SYNTHROID) 200 MCG tablet TAKE 1 TABLET BY MOUTH 1 HOUR BEFORE BREAKFAST ON MONDAY, WEDNESDAY AND FRIDAY. TAKE 1 1/2 TABLETS 1 HR BEFORE BREAKFAST TUES, THURSDAY,  . lubiprostone (AMITIZA) 24 MCG capsule Take 1 capsule (24 mcg total) by mouth 2 (two) times daily with a meal.  . medroxyPROGESTERone (DEPO-PROVERA) 150 MG/ML injection Inject 150 mg into the muscle every 3 (three) months.  . metoprolol tartrate (LOPRESSOR) 50 MG tablet TAKE 1 TABLET BY MOUTH TWICE DAILY  . pantoprazole (PROTONIX) 40 MG tablet Take 1 tablet (40 mg total) by mouth 2 (two) times daily before a meal.  .  polyethylene glycol (MIRALAX / GLYCOLAX) 17 g packet Take 17 g by mouth daily.  . pregabalin (LYRICA) 100 MG capsule Take 100 mg by mouth 3 (three) times daily.  . rosuvastatin (CRESTOR) 10 MG tablet Take 1 tablet (10 mg total) by mouth daily.  . traMADol (ULTRAM) 50 MG tablet Take by mouth every 12 (twelve) hours as needed.  . triamterene-hydrochlorothiazide (MAXZIDE) 75-50 MG tablet Take 1 tablet by mouth daily.  Marland Kitchen venlafaxine (EFFEXOR) 37.5 MG tablet Take by mouth. Once per day and start twice a day next week   No facility-administered encounter medications on file as of 08/30/2020.    Allergies (verified) Codeine, Ibuprofen, and Soybean-containing drug products   History: Past Medical History:  Diagnosis Date  . Anxiety   . Anxiety disorder   . Arthritis    "knees, joints, hands, toes" (09/28/2013)  . Depression   . Depression, major, single episode, in partial remission ([place]) 10/18/2008   Qualifier: Diagnosis of  By: Moshe Cipro MD, Margaret  PHQ 9 score of 7 in 10/2017, not suicidal or homicidal, interested in telepsych however, score of 16 in  02/2018, not suicidal or homicidal, start medication and refer to telepsych Score of 7 in 07/2018   . Fibromyalgia   . Gastritis nov. 2011   EGD by Dr. Oneida Alar, negative h pylori  . GERD (gastroesophageal reflux disease)   . Gout   . Hypertension x 6 years   .  Hypothyroidism   . Migraines    "15/month average" (09/28/2013)  . Obesity (BMI 30.0-34.9)   . Spinal stenosis   . Thyroid goiter    Past Surgical History:  Procedure Laterality Date  . BIOPSY  10/20/2017   Procedure: BIOPSY;  Surgeon: Danie Binder, MD;  [facility];  Service: Endoscopy;;  gastric duodenal esophagus  . CESAREAN SECTION  2008  . CHONDROPLASTY  04/02/2012   Procedure: CHONDROPLASTY;  Surgeon: Carole Civil, MD;  Location: [facility];  Service: Orthopedics;  Laterality: Left;  of left patella  . COLONOSCOPY  May 2012   Dr. Oneida Alar: normal colon, small  internal hemorrhoids  . COLONOSCOPY WITH PROPOFOL N/A 10/22/2017   Procedure: COLONOSCOPY WITH PROPOFOL;  Surgeon: Danie Binder, MD;  [facility];  Service: Endoscopy;  Laterality: N/A;  7:30am  . COLONOSCOPY WITH PROPOFOL N/A 10/20/2017   Procedure: COLONOSCOPY WITH PROPOFOL;  Surgeon: Danie Binder, MD;  [facility];  Service: Endoscopy;  Laterality: N/A;  11:00am  . ESOPHAGOGASTRODUODENOSCOPY (EGD) WITH PROPOFOL N/A 10/20/2017   Procedure: ESOPHAGOGASTRODUODENOSCOPY (EGD) WITH PROPOFOL;  Surgeon: Danie Binder, MD;  [facility];  Service: Endoscopy;  Laterality: N/A;  . INCISION AND DRAINAGE ABSCESS  1997; 2005   "under right buttocks; under left arm"  . SAVORY DILATION N/A 10/20/2017   Procedure: SAVORY DILATION;  Surgeon: Danie Binder, MD;  [facility];  Service: Endoscopy;  Laterality: N/A;  . THYROIDECTOMY N/A 09/28/2013   Procedure: TOTAL THYROIDECTOMY;  Surgeon: Ascencion Dike, MD;  Location: [facility];  Service: ENT;  Laterality: N/A;  . TOTAL THYROIDECTOMY  09/27/2013  . UPPER GI ENDOSCOPY  2011   EGD: gastritis, no h.pylori   Family History  Problem Relation Age of Onset  . Arthritis Mother   . Migraines Mother   . Hypertension Mother   . Cancer Maternal Grandmother        lung  . Asthma Other        family history   . Colon cancer Neg Hx    Social History   Socioeconomic History  . Marital status: Legally Separated    Spouse name: Not on file  . Number of children: 1  . Years of education: 54  . Highest education level: 12th grade  Occupational History  . Occupation: Hospital doctor: UNEMPLOYED  Tobacco Use  . Smoking status: Former Smoker    Packs/day: 1.00    Years: 5.00    Pack years: 5.00    Quit date: 09/11/2010    Years since quitting: 9.9  . Smokeless tobacco: Never Used  . Tobacco comment: QUIT SMOKING X 3 YEARS AGO,was vaping but has since quit completely   Vaping Use  . Vaping Use: Every day  Substance and Sexual Activity  . Alcohol use: No  . Drug use: No  . Sexual activity: Not Currently    Partners: Male    Birth control/protection: Other-see comments, Injection    Comment: Depo  Other Topics Concern  . Not on file  Social History Narrative   Pt is currently on medicaid. From [place] and came to [facility] in mid 2010   Is separated from Husband, lives with Mother right now    Social Determinants of Health   Financial Resource Strain: Not on file  Food Insecurity: Food Insecurity Present  . Worried About Charity fundraiser in the Last Year: Sometimes  true  . Ran Out of Food in the Last Year: Sometimes true  Transportation Needs: No Transportation Needs  . Lack of Transportation (Medical): No  . Lack of Transportation (Non-Medical): No  Physical Activity: Sufficiently Active  . Days of Exercise per Week: 6 days  . Minutes of Exercise per Session: 80 min  Stress: Not on file  Social Connections: Moderately Isolated  . Frequency of Communication with Friends and Family: More than three times a week  . Frequency of Social Gatherings with Friends and Family: More than three times a week  . Attends Religious Services: More than 4 times per year  . Active Member of Clubs or Organizations: No  . Attends Archivist Meetings: Never  . Marital Status: Separated    Tobacco Counseling Counseling given: Not Answered Comment: QUIT SMOKING X 3 YEARS AGO,was vaping but has since quit completely   Clinical Intake:  Pre-visit preparation completed: Yes  Pain : 0-10 Pain Score: 4  Pain Type: Chronic pain     Nutritional Status: BMI > 30  Obese Diabetes: No  How often do you need to have someone help you when you read instructions, pamphlets, or other written materials from your doctor or pharmacy?: 1 - Never What is the last grade level you completed in school?: college  Diabetic? no  Interpreter Needed?: No  Information  entered by :: [email or web address]   Activities of Daily Living In your present state of health, do you have any difficulty performing the following activities: 08/30/2020  Hearing? N  Vision? N  Difficulty concentrating or making decisions? N  Walking or climbing stairs? Y  Dressing or bathing? N  Doing errands, shopping? N  Preparing Food and eating ? N  Using the Toilet? N  In the past six months, have you accidently leaked urine? N  Do you have problems with loss of bowel control? N  Managing your Medications? N  Managing your Finances? N  Housekeeping or managing your Housekeeping? N  Some recent data might be hidden    Patient Care Team: Fayrene Helper, MD as PCP - [facility], Marga Melnick, MD (Inactive) (Gastroenterology)  Indicate any recent Medical Services you may have received from other than [facility] providers in the past year (date may be approximate).     Assessment:   This is a routine wellness examination for [place].  Hearing/Vision screen No exam data present  Dietary issues and exercise activities discussed: Current Exercise Habits: Home exercise routine, Type of exercise: walking, Time (Minutes): 50, Frequency (Times/Week): 6, Weekly Exercise (Minutes/Week): 300, Intensity: Mild, Exercise limited by: orthopedic condition(s)  Goals    . DIET - INCREASE WATER INTAKE    . Increase physical activity    . Patient Stated     I want to start a plant based diet       Depression Screen PHQ 2/9 Scores 07/30/2020 05/22/2020 03/27/2020 03/27/2020 08/30/2019 08/03/2019 04/20/2019  PHQ - 2 Score 0 2 2 2  0 0 0  PHQ- 9 Score - 7 7 6  - 0 -    Fall Risk Fall Risk  08/30/2020 05/22/2020 09/26/2019 08/30/2019 08/03/2019  Falls in the past year? 1 1 0 0 0  Number falls in past yr: 0 1 0 0 0  Injury with Fall? 0 0 0 0 0  Risk for fall due to : - - - - -  Follow up - - - - -    [place]:  Any stairs in or around the home? Yes  If so, are there any  without handrails? No  Home free of loose throw rugs in walkways, pet beds, electrical cords, etc? Yes  Adequate lighting in your home to reduce risk of falls? Yes   ASSISTIVE DEVICES UTILIZED TO PREVENT FALLS:  Life alert? No  Use of a cane, walker or w/c? No  Grab bars in the bathroom? No  Shower chair or bench in shower? No  Elevated toilet seat or a handicapped toilet? No   TIMED UP AND GO:  Was the test performed? No .      Cognitive Function:     6CIT Screen 08/30/2020 08/30/2019 08/12/2018 06/22/2017  What Year? 0 points 0 points 0 points 0 points  What month? 0 points 0 points 0 points 0 points  What time? 0 points 0 points 3 points 0 points  Count back from 20 0 points 0 points 0 points 0 points  Months in reverse 0 points 0 points 0 points 0 points  Repeat phrase 2 points 0 points 0 points 0 points  Total Score 2 0 3 0    Immunizations Immunization History  Administered Date(s) Administered  . Hpv 10/18/2008, 03/07/2009  . Influenza Split 03/26/2016  . Influenza Whole 06/07/2009, 03/28/2010  . Influenza,inj,Quad PF,6+ Mos 05/30/2014, 06/28/2015, 06/22/2017, 03/16/2018, 04/20/2019, 03/27/2020  . Influenza-Unspecified 03/28/2013  . PFIZER(Purple Top)SARS-COV-2 Vaccination 02/07/2020, 03/05/2020  . Td 03/07/2009    TDAP status: Up to date  Flu Vaccine status: Up to date  Pneumococcal vaccine status: Up to date  Covid-19 vaccine status: Completed vaccines  Qualifies for Shingles Vaccine? No   Zostavax completed No   Shingrix Completed?: No.    Education has been provided regarding the importance of this vaccine. Patient has been advised to call insurance company to determine out of pocket expense if they have not yet received this vaccine. Advised may also receive vaccine at local pharmacy or Health Dept. Verbalized acceptance and understanding.  Screening Tests Health Maintenance  Topic Date Due  . Hepatitis C Screening  Never done  . COVID-19 Vaccine (3  - Pfizer risk 4-dose series) 04/02/2020  . TETANUS/TDAP  05/09/2021 (Originally 03/08/2019)  . PAP SMEAR-Modifier  08/02/2022  . INFLUENZA VACCINE  Completed  . HIV Screening  Completed    Health Maintenance  Health Maintenance Due  Topic Date Due  . Hepatitis C Screening  Never done  . COVID-19 Vaccine (3 - Pfizer risk 4-dose series) 04/02/2020    Colorectal cancer screening: No longer required.  not yet due  Mammogram status: No longer required due to  Not 40 years of age.  Bone density- NA  Lung Cancer Screening: (Low Dose CT Chest recommended if Age 8-80 years, 30 pack-year currently smoking OR have quit w/in 15years.) does not qualify.   Lung Cancer Screening Referral: no  Additional Screening:  Hepatitis C Screening: ordered and will complete at next lab draw  Vision Screening: Recommended annual ophthalmology exams for early detection of glaucoma and other disorders of the eye. Is the patient up to date with their annual eye exam?  No  Who is the provider or what is the name of the office in which the patient attends annual eye exams? Wants to see myeydr If pt is not established with a provider, would they like to be referred to a provider to establish care? Yes . Referral entered  Dental Screening: Recommended annual dental exams for proper oral hygiene  [employer]  Referral / Chronic Care Management: CRR required this visit?  No   CCM required this visit?  No      Plan:     I have personally reviewed and noted the following in the patient's chart:   . Medical and social history . Use of alcohol, tobacco or illicit drugs  . Current medications and supplements . Functional ability and status . Nutritional status . Physical activity . Advanced directives . List of other physicians . Hospitalizations, surgeries, and ER visits in previous 12 months . Vitals . Screenings to include cognitive, depression, and falls . Referrals and appointments  In  addition, I have reviewed and discussed with patient certain preventive protocols, quality metrics, and best practice recommendations. A written personalized care plan for preventive services as well as general preventive health recommendations were provided to patient.     Kate Sable, LPN, LPN   [id number]   Nurse Notes: Visit performed in the office, provider in the office patient at home. Gave verbal consent to virtual visit.  Time spent with pt 30 mins

## 2020-08-31 ENCOUNTER — Other Ambulatory Visit: Payer: Self-pay

## 2020-08-31 ENCOUNTER — Ambulatory Visit (INDEPENDENT_AMBULATORY_CARE_PROVIDER_SITE_OTHER): Payer: Medicare Other

## 2020-08-31 DIAGNOSIS — Z30019 Encounter for initial prescription of contraceptives, unspecified: Secondary | ICD-10-CM | POA: Diagnosis not present

## 2020-08-31 DIAGNOSIS — Z308 Encounter for other contraceptive management: Secondary | ICD-10-CM

## 2020-08-31 LAB — POCT URINE PREGNANCY: Preg Test, Ur: NEGATIVE

## 2020-08-31 MED ORDER — MEDROXYPROGESTERONE ACETATE 150 MG/ML IM SUSP
150.0000 mg | Freq: Once | INTRAMUSCULAR | Status: AC
  Administered 2020-08-31: 150 mg via INTRAMUSCULAR

## 2020-09-10 DIAGNOSIS — M545 Low back pain, unspecified: Secondary | ICD-10-CM | POA: Diagnosis not present

## 2020-09-10 DIAGNOSIS — M130 Polyarthritis, unspecified: Secondary | ICD-10-CM | POA: Diagnosis not present

## 2020-09-10 DIAGNOSIS — I10 Essential (primary) hypertension: Secondary | ICD-10-CM | POA: Diagnosis not present

## 2020-09-10 DIAGNOSIS — Z79899 Other long term (current) drug therapy: Secondary | ICD-10-CM | POA: Diagnosis not present

## 2020-09-10 DIAGNOSIS — M25569 Pain in unspecified knee: Secondary | ICD-10-CM | POA: Diagnosis not present

## 2020-09-10 DIAGNOSIS — Z79891 Long term (current) use of opiate analgesic: Secondary | ICD-10-CM | POA: Diagnosis not present

## 2020-09-10 DIAGNOSIS — M797 Fibromyalgia: Secondary | ICD-10-CM | POA: Diagnosis not present

## 2020-09-20 DIAGNOSIS — M7062 Trochanteric bursitis, left hip: Secondary | ICD-10-CM | POA: Diagnosis not present

## 2020-09-20 DIAGNOSIS — M1612 Unilateral primary osteoarthritis, left hip: Secondary | ICD-10-CM | POA: Diagnosis not present

## 2020-09-20 DIAGNOSIS — M1611 Unilateral primary osteoarthritis, right hip: Secondary | ICD-10-CM | POA: Diagnosis not present

## 2020-09-20 DIAGNOSIS — M67952 Unspecified disorder of synovium and tendon, left thigh: Secondary | ICD-10-CM | POA: Diagnosis not present

## 2020-09-20 DIAGNOSIS — M7061 Trochanteric bursitis, right hip: Secondary | ICD-10-CM | POA: Diagnosis not present

## 2020-09-20 DIAGNOSIS — M67951 Unspecified disorder of synovium and tendon, right thigh: Secondary | ICD-10-CM | POA: Diagnosis not present

## 2020-09-25 DIAGNOSIS — M25551 Pain in right hip: Secondary | ICD-10-CM | POA: Diagnosis not present

## 2020-09-25 DIAGNOSIS — M25552 Pain in left hip: Secondary | ICD-10-CM | POA: Diagnosis not present

## 2020-09-25 DIAGNOSIS — M1611 Unilateral primary osteoarthritis, right hip: Secondary | ICD-10-CM | POA: Diagnosis not present

## 2020-09-30 ENCOUNTER — Other Ambulatory Visit: Payer: Self-pay | Admitting: Nurse Practitioner

## 2020-09-30 DIAGNOSIS — K219 Gastro-esophageal reflux disease without esophagitis: Secondary | ICD-10-CM

## 2020-09-30 DIAGNOSIS — K625 Hemorrhage of anus and rectum: Secondary | ICD-10-CM

## 2020-09-30 DIAGNOSIS — K581 Irritable bowel syndrome with constipation: Secondary | ICD-10-CM

## 2020-10-01 ENCOUNTER — Other Ambulatory Visit: Payer: Self-pay | Admitting: Nurse Practitioner

## 2020-10-01 DIAGNOSIS — K625 Hemorrhage of anus and rectum: Secondary | ICD-10-CM

## 2020-10-01 DIAGNOSIS — K581 Irritable bowel syndrome with constipation: Secondary | ICD-10-CM

## 2020-10-01 DIAGNOSIS — K219 Gastro-esophageal reflux disease without esophagitis: Secondary | ICD-10-CM

## 2020-10-08 DIAGNOSIS — I10 Essential (primary) hypertension: Secondary | ICD-10-CM | POA: Diagnosis not present

## 2020-10-08 DIAGNOSIS — M130 Polyarthritis, unspecified: Secondary | ICD-10-CM | POA: Diagnosis not present

## 2020-10-08 DIAGNOSIS — Z79891 Long term (current) use of opiate analgesic: Secondary | ICD-10-CM | POA: Diagnosis not present

## 2020-10-08 DIAGNOSIS — M25569 Pain in unspecified knee: Secondary | ICD-10-CM | POA: Diagnosis not present

## 2020-10-08 DIAGNOSIS — M797 Fibromyalgia: Secondary | ICD-10-CM | POA: Diagnosis not present

## 2020-10-08 DIAGNOSIS — G894 Chronic pain syndrome: Secondary | ICD-10-CM | POA: Diagnosis not present

## 2020-10-25 ENCOUNTER — Encounter (HOSPITAL_COMMUNITY): Payer: Self-pay | Admitting: Physical Therapy

## 2020-10-25 NOTE — Therapy (Signed)
[place], [place], [address] Phone: [phone]   Fax:  [phone]  Patient Details  Name: WALESKA BUTTERY MRN: [id number] Date of Birth: 07-16-1981 Referring Provider:  Douglass Rivers   Encounter Date: 10/25/2020    PHYSICAL THERAPY DISCHARGE SUMMARY  Visits from Start of Care: 7  Current functional level related to goals / functional outcomes: Unknown as pt did not return to clinic    Remaining deficits: Unknown as pt did not return to clinic    Education / Equipment: HEP Plan: Patient agrees to discharge.  Patient goals were not met. Patient is being discharged due to not returning since the last visit.  ?????     Rayetta Humphrey, PT CLT [phone] 10/25/2020, 8:47 AM  [place], [place], [address] Phone: [phone]   Fax:  [phone]

## 2020-10-28 ENCOUNTER — Other Ambulatory Visit: Payer: Self-pay | Admitting: Family Medicine

## 2020-10-29 ENCOUNTER — Other Ambulatory Visit: Payer: Self-pay

## 2020-10-29 DIAGNOSIS — E785 Hyperlipidemia, unspecified: Secondary | ICD-10-CM

## 2020-10-29 MED ORDER — ROSUVASTATIN CALCIUM 10 MG PO TABS: 10.0000 mg | ORAL_TABLET | Freq: Every day | ORAL | 3 refills | Status: DC

## 2020-11-20 ENCOUNTER — Other Ambulatory Visit: Payer: Self-pay

## 2020-11-20 ENCOUNTER — Ambulatory Visit (INDEPENDENT_AMBULATORY_CARE_PROVIDER_SITE_OTHER): Payer: Medicare Other

## 2020-11-20 VITALS — Wt 183.0 lb

## 2020-11-20 DIAGNOSIS — Z309 Encounter for contraceptive management, unspecified: Secondary | ICD-10-CM | POA: Diagnosis not present

## 2020-11-20 DIAGNOSIS — Z3042 Encounter for surveillance of injectable contraceptive: Secondary | ICD-10-CM

## 2020-11-20 MED ORDER — MEDROXYPROGESTERONE ACETATE 150 MG/ML IM SUSP: 150.0000 mg | Freq: Once | INTRAMUSCULAR | Status: DC

## 2020-11-27 ENCOUNTER — Ambulatory Visit: Payer: Medicare Other | Admitting: Family Medicine

## 2020-12-04 ENCOUNTER — Other Ambulatory Visit: Payer: Self-pay | Admitting: Family Medicine

## 2020-12-04 DIAGNOSIS — M2012 Hallux valgus (acquired), left foot: Secondary | ICD-10-CM | POA: Diagnosis not present

## 2020-12-04 DIAGNOSIS — R21 Rash and other nonspecific skin eruption: Secondary | ICD-10-CM | POA: Diagnosis not present

## 2020-12-04 DIAGNOSIS — M79642 Pain in left hand: Secondary | ICD-10-CM | POA: Diagnosis not present

## 2020-12-04 DIAGNOSIS — M792 Neuralgia and neuritis, unspecified: Secondary | ICD-10-CM | POA: Diagnosis not present

## 2020-12-04 DIAGNOSIS — Z8739 Personal history of other diseases of the musculoskeletal system and connective tissue: Secondary | ICD-10-CM | POA: Diagnosis not present

## 2020-12-04 DIAGNOSIS — M2011 Hallux valgus (acquired), right foot: Secondary | ICD-10-CM | POA: Diagnosis not present

## 2020-12-04 DIAGNOSIS — M79641 Pain in right hand: Secondary | ICD-10-CM | POA: Diagnosis not present

## 2020-12-04 DIAGNOSIS — M2142 Flat foot [pes planus] (acquired), left foot: Secondary | ICD-10-CM | POA: Diagnosis not present

## 2020-12-04 DIAGNOSIS — M2141 Flat foot [pes planus] (acquired), right foot: Secondary | ICD-10-CM | POA: Diagnosis not present

## 2020-12-04 DIAGNOSIS — M79672 Pain in left foot: Secondary | ICD-10-CM | POA: Diagnosis not present

## 2020-12-04 DIAGNOSIS — R269 Unspecified abnormalities of gait and mobility: Secondary | ICD-10-CM | POA: Diagnosis not present

## 2020-12-04 DIAGNOSIS — M79671 Pain in right foot: Secondary | ICD-10-CM | POA: Diagnosis not present

## 2020-12-04 DIAGNOSIS — M1990 Unspecified osteoarthritis, unspecified site: Secondary | ICD-10-CM | POA: Diagnosis not present

## 2020-12-04 DIAGNOSIS — G8929 Other chronic pain: Secondary | ICD-10-CM | POA: Diagnosis not present

## 2021-01-07 ENCOUNTER — Other Ambulatory Visit: Payer: Self-pay | Admitting: Gastroenterology

## 2021-01-07 DIAGNOSIS — K219 Gastro-esophageal reflux disease without esophagitis: Secondary | ICD-10-CM

## 2021-01-07 DIAGNOSIS — K581 Irritable bowel syndrome with constipation: Secondary | ICD-10-CM

## 2021-01-07 DIAGNOSIS — K625 Hemorrhage of anus and rectum: Secondary | ICD-10-CM

## 2021-01-22 ENCOUNTER — Encounter: Payer: Self-pay | Admitting: Internal Medicine

## 2021-01-23 DIAGNOSIS — Z79891 Long term (current) use of opiate analgesic: Secondary | ICD-10-CM | POA: Diagnosis not present

## 2021-01-23 DIAGNOSIS — M25569 Pain in unspecified knee: Secondary | ICD-10-CM | POA: Diagnosis not present

## 2021-01-23 DIAGNOSIS — M130 Polyarthritis, unspecified: Secondary | ICD-10-CM | POA: Diagnosis not present

## 2021-01-23 DIAGNOSIS — M797 Fibromyalgia: Secondary | ICD-10-CM | POA: Diagnosis not present

## 2021-01-23 DIAGNOSIS — I10 Essential (primary) hypertension: Secondary | ICD-10-CM | POA: Diagnosis not present

## 2021-02-12 ENCOUNTER — Other Ambulatory Visit: Payer: Self-pay

## 2021-02-12 ENCOUNTER — Ambulatory Visit (INDEPENDENT_AMBULATORY_CARE_PROVIDER_SITE_OTHER): Payer: Medicare Other

## 2021-02-12 DIAGNOSIS — Z308 Encounter for other contraceptive management: Secondary | ICD-10-CM | POA: Diagnosis not present

## 2021-02-12 MED ORDER — MEDROXYPROGESTERONE ACETATE 150 MG/ML IM SUSP
150.0000 mg | Freq: Once | INTRAMUSCULAR | Status: AC
  Administered 2021-02-12: 150 mg via INTRAMUSCULAR

## 2021-02-13 ENCOUNTER — Telehealth (INDEPENDENT_AMBULATORY_CARE_PROVIDER_SITE_OTHER): Payer: Medicare Other | Admitting: Family Medicine

## 2021-02-13 DIAGNOSIS — R7301 Impaired fasting glucose: Secondary | ICD-10-CM | POA: Diagnosis not present

## 2021-02-13 DIAGNOSIS — N760 Acute vaginitis: Secondary | ICD-10-CM

## 2021-02-13 DIAGNOSIS — I10 Essential (primary) hypertension: Secondary | ICD-10-CM | POA: Diagnosis not present

## 2021-02-13 DIAGNOSIS — E785 Hyperlipidemia, unspecified: Secondary | ICD-10-CM

## 2021-02-13 DIAGNOSIS — E876 Hypokalemia: Secondary | ICD-10-CM | POA: Diagnosis not present

## 2021-02-13 DIAGNOSIS — Z1159 Encounter for screening for other viral diseases: Secondary | ICD-10-CM | POA: Diagnosis not present

## 2021-02-13 DIAGNOSIS — E669 Obesity, unspecified: Secondary | ICD-10-CM | POA: Diagnosis not present

## 2021-02-13 DIAGNOSIS — R7989 Other specified abnormal findings of blood chemistry: Secondary | ICD-10-CM | POA: Diagnosis not present

## 2021-02-13 NOTE — Patient Instructions (Addendum)
Follow-up as before call if you need me sooner.  Please submit specimen for testing for infection as soon as possible.No medication is being prescribed until the results are available. (wet prep and culture)  Fasting blood work is overdue please get this done this week CBC ,lipid CMP and EGFR ,hemoglobin A1c TSH and vitamin D.and hep C screen  Congrats on weight loss keep it up.  Covid booster is overdue  Thanks for choosing [facility], we consider it a privelige to serve you.

## 2021-02-13 NOTE — Progress Notes (Signed)
Virtual Visit via Telephone Note  I connected with Martha Montgomery on 02/13/21 at  8:00 AM EDT by telephone and verified that I am speaking with the correct person using two identifiers.  Location: Patient: home Provider: office   I discussed the limitations, risks, security and privacy concerns of performing an evaluation and management service by telephone and the availability of in person appointments. I also discussed with the patient that there may be a patient responsible charge related to this service. The patient expressed understanding and agreed to proceed.   History of Present Illness: 1 week h/o vaginal pain and itching, n recent sexual activity, denies fever, chills or urinary symptoms   Observations/Objective: There were no vitals taken for this visit. Good communication with no confusion and intact memory. Alert and oriented x 3 No signs of respiratory distress during speech   Assessment and Plan: Vaginitis 1 week history of itching and increased d/c , specimens to be sent for testing  Acute vaginitis STD screen with self collected specimen, wait on result before prescribing medication   Follow Up Instructions:    I discussed the assessment and treatment plan with the patient. The patient was provided an opportunity to ask questions and all were answered. The patient agreed with the plan and demonstrated an understanding of the instructions.   The patient was advised to call back or seek an in-person evaluation if the symptoms worsen or if the condition fails to improve as anticipated.  I provided 8 minutes of non-face-to-face time during this encounter.   Tula Nakayama, MD

## 2021-02-13 NOTE — Assessment & Plan Note (Signed)
1 week history of itching and increased d/c , specimens to be sent for testing

## 2021-02-15 ENCOUNTER — Ambulatory Visit: Payer: Medicare Other

## 2021-02-15 ENCOUNTER — Other Ambulatory Visit: Payer: Self-pay

## 2021-02-15 ENCOUNTER — Other Ambulatory Visit (HOSPITAL_COMMUNITY)
Admission: RE | Admit: 2021-02-15 | Discharge: 2021-02-15 | Disposition: A | Discharge status: Home / Self Care | Payer: Medicare Other | Source: Ambulatory Visit | Attending: Family Medicine | Admitting: Family Medicine

## 2021-02-15 DIAGNOSIS — N760 Acute vaginitis: Secondary | ICD-10-CM

## 2021-02-15 DIAGNOSIS — I10 Essential (primary) hypertension: Secondary | ICD-10-CM | POA: Diagnosis not present

## 2021-02-15 DIAGNOSIS — Z1159 Encounter for screening for other viral diseases: Secondary | ICD-10-CM | POA: Diagnosis not present

## 2021-02-15 DIAGNOSIS — R7301 Impaired fasting glucose: Secondary | ICD-10-CM | POA: Diagnosis not present

## 2021-02-15 DIAGNOSIS — E876 Hypokalemia: Secondary | ICD-10-CM | POA: Diagnosis not present

## 2021-02-15 DIAGNOSIS — E785 Hyperlipidemia, unspecified: Secondary | ICD-10-CM | POA: Diagnosis not present

## 2021-02-16 LAB — CMP14+EGFR
ALT: 17 IU/L (ref 0–32)
AST: 16 IU/L (ref 0–40)
Albumin/Globulin Ratio: 2 (ref 1.2–2.2)
Albumin: 4.5 g/dL (ref 3.8–4.8)
Alkaline Phosphatase: 72 IU/L (ref 44–121)
BUN/Creatinine Ratio: 8 — ABNORMAL LOW (ref 9–23)
BUN: 8 mg/dL (ref 6–20)
Bilirubin Total: 0.2 mg/dL (ref 0.0–1.2)
CO2: 21 mmol/L (ref 20–29)
Calcium: 9.3 mg/dL (ref 8.7–10.2)
Chloride: 102 mmol/L (ref 96–106)
Creatinine, Ser: 0.97 mg/dL (ref 0.57–1.00)
Globulin, Total: 2.3 g/dL (ref 1.5–4.5)
Glucose: 89 mg/dL (ref 65–99)
Potassium: 3.7 mmol/L (ref 3.5–5.2)
Sodium: 140 mmol/L (ref 134–144)
Total Protein: 6.8 g/dL (ref 6.0–8.5)
eGFR: 77 mL/min/{1.73_m2} (ref >59)

## 2021-02-16 LAB — CBC WITH DIFFERENTIAL/PLATELET
Basophils Absolute: 0.1 10*3/uL (ref 0.0–0.2)
Basos: 1 %
EOS (ABSOLUTE): 0.1 10*3/uL (ref 0.0–0.4)
Eos: 1 %
Hematocrit: 39.5 % (ref 34.0–46.6)
Hemoglobin: 13.3 g/dL (ref 11.1–15.9)
Immature Grans (Abs): 0 10*3/uL (ref 0.0–0.1)
Immature Granulocytes: 0 %
Lymphocytes Absolute: 3.2 10*3/uL — ABNORMAL HIGH (ref 0.7–3.1)
Lymphs: 47 %
MCH: 30.7 pg (ref 26.6–33.0)
MCHC: 33.7 g/dL (ref 31.5–35.7)
MCV: 91 fL (ref 79–97)
Monocytes Absolute: 0.5 10*3/uL (ref 0.1–0.9)
Monocytes: 7 %
Neutrophils Absolute: 3 10*3/uL (ref 1.4–7.0)
Neutrophils: 44 %
Platelets: 469 10*3/uL — ABNORMAL HIGH (ref 150–450)
RBC: 4.33 x10E6/uL (ref 3.77–5.28)
RDW: 13.5 % (ref 11.7–15.4)
WBC: 6.8 10*3/uL (ref 3.4–10.8)

## 2021-02-16 LAB — LIPID PANEL
Chol/HDL Ratio: 3.8 ratio (ref 0.0–4.4)
Cholesterol, Total: 214 mg/dL — ABNORMAL HIGH (ref 100–199)
HDL: 57 mg/dL (ref >39)
LDL Chol Calc (NIH): 144 mg/dL — ABNORMAL HIGH (ref 0–99)
Triglycerides: 76 mg/dL (ref 0–149)
VLDL Cholesterol Cal: 13 mg/dL (ref 5–40)

## 2021-02-16 LAB — HEPATITIS C ANTIBODY: Hep C Virus Ab: 0.1 s/co ratio (ref 0.0–0.9)

## 2021-02-16 LAB — TSH: TSH: 14.8 u[IU]/mL — ABNORMAL HIGH (ref 0.450–4.500)

## 2021-02-16 LAB — HEMOGLOBIN A1C
Est. average glucose Bld gHb Est-mCnc: 114 mg/dL
Hgb A1c MFr Bld: 5.6 % (ref 4.8–5.6)

## 2021-02-16 LAB — VITAMIN D 25 HYDROXY (VIT D DEFICIENCY, FRACTURES): Vit D, 25-Hydroxy: 12.7 ng/mL — ABNORMAL LOW (ref 30.0–100.0)

## 2021-02-17 ENCOUNTER — Encounter: Payer: Self-pay | Admitting: Family Medicine

## 2021-02-17 DIAGNOSIS — N760 Acute vaginitis: Secondary | ICD-10-CM | POA: Insufficient documentation

## 2021-02-17 NOTE — Assessment & Plan Note (Signed)
STD screen with self collected specimen, wait on result before prescribing medication

## 2021-02-19 ENCOUNTER — Other Ambulatory Visit: Payer: Self-pay | Admitting: Family Medicine

## 2021-02-19 LAB — CERVICOVAGINAL ANCILLARY ONLY
Bacterial Vaginitis (gardnerella): NEGATIVE
Candida Glabrata: NEGATIVE
Candida Vaginitis: POSITIVE — AB
Chlamydia: NEGATIVE
Comment: NEGATIVE
Comment: NEGATIVE
Comment: NEGATIVE
Comment: NEGATIVE
Comment: NEGATIVE
Comment: NORMAL
Neisseria Gonorrhea: NEGATIVE
Trichomonas: NEGATIVE

## 2021-02-19 MED ORDER — FLUCONAZOLE 150 MG PO TABS: 150.0000 mg | ORAL_TABLET | Freq: Once | ORAL | 0 refills | Status: AC

## 2021-02-19 MED ORDER — LEVOTHYROXINE SODIUM 200 MCG PO TABS: ORAL_TABLET | ORAL | 1 refills | Status: DC

## 2021-02-20 ENCOUNTER — Other Ambulatory Visit: Payer: Self-pay | Admitting: Family Medicine

## 2021-02-20 ENCOUNTER — Other Ambulatory Visit: Payer: Self-pay

## 2021-02-20 DIAGNOSIS — E039 Hypothyroidism, unspecified: Secondary | ICD-10-CM

## 2021-02-20 NOTE — Progress Notes (Signed)
Pt informed

## 2021-02-27 ENCOUNTER — Other Ambulatory Visit: Payer: Self-pay | Admitting: Family Medicine

## 2021-03-28 DIAGNOSIS — Z79899 Other long term (current) drug therapy: Secondary | ICD-10-CM | POA: Diagnosis not present

## 2021-03-28 DIAGNOSIS — M797 Fibromyalgia: Secondary | ICD-10-CM | POA: Diagnosis not present

## 2021-03-28 DIAGNOSIS — M25569 Pain in unspecified knee: Secondary | ICD-10-CM | POA: Diagnosis not present

## 2021-03-28 DIAGNOSIS — I10 Essential (primary) hypertension: Secondary | ICD-10-CM | POA: Diagnosis not present

## 2021-03-28 DIAGNOSIS — Z79891 Long term (current) use of opiate analgesic: Secondary | ICD-10-CM | POA: Diagnosis not present

## 2021-03-28 DIAGNOSIS — M130 Polyarthritis, unspecified: Secondary | ICD-10-CM | POA: Diagnosis not present

## 2021-05-14 ENCOUNTER — Ambulatory Visit: Payer: Medicare Other | Admitting: Family Medicine

## 2021-05-14 ENCOUNTER — Other Ambulatory Visit: Payer: Self-pay | Admitting: Family Medicine

## 2021-05-23 ENCOUNTER — Other Ambulatory Visit: Payer: Self-pay | Admitting: Family Medicine

## 2021-05-30 DIAGNOSIS — R7989 Other specified abnormal findings of blood chemistry: Secondary | ICD-10-CM | POA: Diagnosis not present

## 2021-05-30 DIAGNOSIS — E890 Postprocedural hypothyroidism: Secondary | ICD-10-CM | POA: Diagnosis not present

## 2021-05-30 DIAGNOSIS — E559 Vitamin D deficiency, unspecified: Secondary | ICD-10-CM | POA: Diagnosis not present

## 2021-05-30 DIAGNOSIS — K2970 Gastritis, unspecified, without bleeding: Secondary | ICD-10-CM | POA: Diagnosis not present

## 2021-05-30 DIAGNOSIS — Z79899 Other long term (current) drug therapy: Secondary | ICD-10-CM | POA: Diagnosis not present

## 2021-05-30 DIAGNOSIS — Z808 Family history of malignant neoplasm of other organs or systems: Secondary | ICD-10-CM | POA: Diagnosis not present

## 2021-06-09 ENCOUNTER — Encounter (HOSPITAL_COMMUNITY): Payer: Self-pay | Admitting: *Deleted

## 2021-06-09 ENCOUNTER — Emergency Department (HOSPITAL_COMMUNITY)
Admission: EM | Admit: 2021-06-09 | Discharge: 2021-06-09 | Disposition: A | Discharge status: Home / Self Care | Payer: Medicare Other | Attending: Emergency Medicine | Admitting: Emergency Medicine

## 2021-06-09 DIAGNOSIS — M7989 Other specified soft tissue disorders: Secondary | ICD-10-CM

## 2021-06-09 DIAGNOSIS — Z87891 Personal history of nicotine dependence: Secondary | ICD-10-CM | POA: Insufficient documentation

## 2021-06-09 DIAGNOSIS — M25422 Effusion, left elbow: Secondary | ICD-10-CM | POA: Insufficient documentation

## 2021-06-09 DIAGNOSIS — E049 Nontoxic goiter, unspecified: Secondary | ICD-10-CM | POA: Diagnosis not present

## 2021-06-09 DIAGNOSIS — I10 Essential (primary) hypertension: Secondary | ICD-10-CM | POA: Insufficient documentation

## 2021-06-09 DIAGNOSIS — Z79899 Other long term (current) drug therapy: Secondary | ICD-10-CM | POA: Insufficient documentation

## 2021-06-09 DIAGNOSIS — M25522 Pain in left elbow: Secondary | ICD-10-CM

## 2021-06-09 DIAGNOSIS — R600 Localized edema: Secondary | ICD-10-CM

## 2021-06-09 MED ORDER — CELECOXIB 200 MG PO CAPS: 200.0000 mg | ORAL_CAPSULE | Freq: Two times a day (BID) | ORAL | 0 refills | Status: DC

## 2021-06-09 NOTE — ED Triage Notes (Signed)
Knots in left arm x 1 week

## 2021-06-09 NOTE — Discharge Instructions (Addendum)
Rest ice and compress your elbow with a elbow sleeve. Return tomorrow for ultrasound of the upper extremity to rule out a DVT. Contact a health care provider if: Your pain does not improve or it gets worse. You notice numbness in your hand. Get help right away if: Your pain is severe. You cannot move your wrist. You have chest pain or shortness of breath.

## 2021-06-09 NOTE — ED Provider Notes (Signed)
[facility] EMERGENCY DEPARTMENT Provider Note   CSN: [id number] Arrival date & time: 06/09/21  1525     History Chief Complaint  Patient presents with   Arm Pain    Martha Montgomery is a 40 y.o. female who presents emergency department chief complaint of left elbow pain.  Patient states that she noticed pain in her left elbow and swelling going on for about a week.  She she has predominantly left hand dominant.  She is currently working at [employer] and is in [place] and states that she is using her arms a lot.  She noticed swelling across the arm and has occasionally had numbness and tingling in the left fourth and fifth digits but has none today.  Patient denies any weakness of grip.  She has tried topical Voltaren without relief symptom relief.  She is concerned she might have a DVT due to her arm swelling.  She denies injury to the arm.  She denies chest pain or shortness of breath.   Arm Pain      Past Medical History:  Diagnosis Date   Anxiety    Anxiety disorder    Arthritis    "knees, joints, hands, toes" (09/28/2013)   Depression    Depression, major, single episode, in partial remission ([place]) 10/18/2008   Qualifier: Diagnosis of  By: Moshe Cipro MD, Margaret  PHQ 9 score of 7 in 10/2017, not suicidal or homicidal, interested in telepsych however, score of 16 in  02/2018, not suicidal or homicidal, start medication and refer to telepsych Score of 7 in 07/2018    Fibromyalgia    Gastritis nov. 2011   EGD by Dr. Oneida Alar, negative h pylori   GERD (gastroesophageal reflux disease)    Gout    Hypertension x 6 years    Hypothyroidism    Migraines    "15/month average" (09/28/2013)   Obesity (BMI 30.0-34.9)    Spinal stenosis    Thyroid goiter     Patient Active Problem List   Diagnosis Date Noted   Acute vaginitis 02/17/2021   Hypertension    Finger pain, right 07/31/2020   Bilateral groin pain 07/31/2020   Depression, major, single episode, moderate ([place])  01/01/2020   Dental caries 04/24/2019   Obesity (BMI 30.0-34.9) 01/08/2019   Multiple benign lumps of breast 08/17/2018   Hemorrhoids 05/19/2018   Encounter for contraceptive management 03/20/2018   Low vitamin D level 11/30/2017   Esophageal dysphagia 09/09/2017   Bloating 03/05/2017   Bilateral knee pain 01/05/2017   Amenorrhea 01/05/2017   Bilateral hip pain 07/22/2016   Post-surgical hypothyroidism 06/02/2014   Vaginitis 02/26/2013   HTN (hypertension), benign 06/30/2012   Knee pain 06/02/2012   Patellar malalignment syndrome 03/09/2012   IBS (irritable bowel syndrome) 08/29/2011   GERD (gastroesophageal reflux disease) 08/29/2011   Back pain 09/24/2010   Migraine headache 04/02/2010   Constipation 09/12/2009   Allergic rhinitis 06/16/2009   Depression, major, single episode, in partial remission ([place]) 10/18/2008    Past Surgical History:  Procedure Laterality Date   BIOPSY  10/20/2017   Procedure: BIOPSY;  Surgeon: Danie Binder, MD;  Location: [facility];  Service: Endoscopy;;  gastric duodenal esophagus   CESAREAN SECTION  2008   CHONDROPLASTY  04/02/2012   Procedure: CHONDROPLASTY;  Surgeon: Carole Civil, MD;  Location: [facility] ORS;  Service: Orthopedics;  Laterality: Left;  of left patella   COLONOSCOPY  May 2012   Dr. Oneida Alar: normal colon, small internal  hemorrhoids   COLONOSCOPY WITH PROPOFOL N/A 10/22/2017   Procedure: COLONOSCOPY WITH PROPOFOL;  Surgeon: Danie Binder, MD;  Location: [facility];  Service: Endoscopy;  Laterality: N/A;  7:30am   COLONOSCOPY WITH PROPOFOL N/A 10/20/2017   Procedure: COLONOSCOPY WITH PROPOFOL;  Surgeon: Danie Binder, MD;  Location: [facility];  Service: Endoscopy;  Laterality: N/A;  11:00am   ESOPHAGOGASTRODUODENOSCOPY (EGD) WITH PROPOFOL N/A 10/20/2017   Procedure: ESOPHAGOGASTRODUODENOSCOPY (EGD) WITH PROPOFOL;  Surgeon: Danie Binder, MD;  Location: [facility];  Service: Endoscopy;  Laterality: N/A;   INCISION  AND DRAINAGE ABSCESS  1997; 2005   "under right buttocks; under left arm"   SAVORY DILATION N/A 10/20/2017   Procedure: SAVORY DILATION;  Surgeon: Danie Binder, MD;  Location: [facility];  Service: Endoscopy;  Laterality: N/A;   THYROIDECTOMY N/A 09/28/2013   Procedure: TOTAL THYROIDECTOMY;  Surgeon: Ascencion Dike, MD;  Location[facility];  Service: ENT;  Laterality: N/A;   TOTAL THYROIDECTOMY  09/27/2013   UPPER GI ENDOSCOPY  2011   EGD: gastritis, no h.pylori     OB History     Gravida  1   Para  1   Term      Preterm      AB      Living  1      SAB      IAB      Ectopic      Multiple      Live Births              Family History  Problem Relation Age of Onset   Arthritis Mother    Migraines Mother    Hypertension Mother    Cancer Maternal Grandmother        lung   Asthma Other        family history    Colon cancer Neg Hx     Social History   Tobacco Use   Smoking status: Former    Packs/day: 1.00    Years: 5.00    Pack years: 5.00    Types: Cigarettes    Quit date: 09/11/2010    Years since quitting: 10.7   Smokeless tobacco: Never   Tobacco comments:    QUIT SMOKING X 3 YEARS AGO,was vaping but has since quit completely  Vaping Use   Vaping Use: Every day  Substance Use Topics   Alcohol use: No   Drug use: No    Home Medications Prior to Admission medications   Medication Sig Start Date End Date Taking? Authorizing Provider  amLODipine (NORVASC) 10 MG tablet TAKE 1 TABLET(10 MG) BY MOUTH DAILY 02/27/21   Fayrene Helper, MD  buPROPion (WELLBUTRIN XL) 300 MG 24 hr tablet TAKE 1 TABLET(300 MG) BY MOUTH DAILY 08/01/20   Fayrene Helper, MD  fluticasone (FLONASE) 50 MCG/ACT nasal spray Place 1 spray into both nostrils daily as needed for allergies or rhinitis.    [provider]  levothyroxine (SYNTHROID) 200 MCG tablet TAKE 1 AND 1/2 TABLETS BY MOUTH EVERY DAY ON AN EMPTY STOMACH. DO NOT EAT. LESS THAN 2 HOURS AFTER TAKING THE  MEDICATION 05/23/21   Fayrene Helper, MD  lubiprostone (AMITIZA) 24 MCG capsule Take 1 capsule (24 mcg total) by mouth 2 (two) times daily with a meal. 02/14/20   Carlis Stable, NP  medroxyPROGESTERone (DEPO-PROVERA) 150 MG/ML injection Inject 150 mg into the muscle every 3 (three) months.    [provider]  metoprolol tartrate (LOPRESSOR) 50 MG tablet TAKE 1 TABLET BY MOUTH TWICE DAILY 03/04/19   Fayrene Helper, MD  pantoprazole (PROTONIX) 40 MG tablet TAKE 1 TABLET BY MOUTH TWICE DAILY BEFORE A MEAL 01/07/21   Annitta Needs, NP  polyethylene glycol (MIRALAX / GLYCOLAX) 17 g packet Take 17 g by mouth daily.    [provider]  pregabalin (LYRICA) 100 MG capsule Take 100 mg by mouth 3 (three) times daily.    [provider]  rosuvastatin (CRESTOR) 10 MG tablet Take 1 tablet (10 mg total) by mouth daily. 10/29/20   Perlie Mayo, NP  traMADol (ULTRAM) 50 MG tablet Take by mouth every 12 (twelve) hours as needed.    [provider]  triamterene-hydrochlorothiazide (MAXZIDE) 75-50 MG tablet TAKE 1 TABLET BY MOUTH DAILY 05/14/21   Fayrene Helper, MD  venlafaxine [facility]) 37.5 MG tablet Take by mouth. Once per day and start twice a day next week 05/08/20   [provider]    Allergies    Codeine, Ibuprofen, and Soybean-containing drug products  Review of Systems   Review of Systems Ten systems reviewed and are negative for acute change, except as noted in the HPI.   Physical Exam Updated Vital Signs BP (!) 162/99   Pulse (!) 110   Temp 99 F (37.2 C)   Resp 20   SpO2 99%   Physical Exam Vitals and nursing note reviewed.  Constitutional:      General: She is not in acute distress.    Appearance: She is well-developed. She is not diaphoretic.  HENT:     Head: Normocephalic and atraumatic.     Right Ear: External ear normal.     Left Ear: External ear normal.     Nose: Nose normal.     Mouth/Throat:     Mouth: Mucous membranes  are moist.  Eyes:     General: No scleral icterus.    Conjunctiva/sclera: Conjunctivae normal.  Cardiovascular:     Rate and Rhythm: Normal rate and regular rhythm.     Heart sounds: Normal heart sounds. No murmur heard.   No friction rub. No gallop.  Pulmonary:     Effort: Pulmonary effort is normal. No respiratory distress.     Breath sounds: Normal breath sounds.  Abdominal:     General: Bowel sounds are normal. There is no distension.     Palpations: Abdomen is soft. There is no mass.     Tenderness: There is no abdominal tenderness. There is no guarding.  Musculoskeletal:     Cervical back: Normal range of motion.     Comments: Mild swelling over the medial aspect of the left elbow.  Tenderness over the medial epicondyles.  Normal grip strengths, normal ipsilateral wrist hand and shoulder examination, 2+ radial pulse.  Skin:    General: Skin is warm and dry.  Neurological:     Mental Status: She is alert and oriented to person, place, and time.  Psychiatric:        Behavior: Behavior normal.    ED Results / Procedures / Treatments   Labs (all labs ordered are listed, but only abnormal results are displayed) Labs Reviewed - No data to display  EKG None  Radiology No results found.  Procedures Procedures   Medications Ordered in ED Medications - No data to display  ED Course  I have reviewed the triage vital signs and the nursing notes.  Pertinent labs & imaging  results that were available during my care of the patient were reviewed by me and considered in my medical decision making (see chart for details).    MDM Rules/Calculators/A&P                         Patient here with left elbow swelling.  Suspect medial epicondylitis.  She does not appear to have circumferential swelling however we will have her return tomorrow for an ultrasound of the left upper extremity.  In the meantime patient will be discharged with instructions to use an elbow sleeve, ice, rest.   She has a history of gastritis secondary to use of NSAIDs and she is willing to try Celebrex.  Prescription sent to her pharmacy.  Discussed outpatient follow-up and return precautions as well as instructions for return for ultrasound tomorrow. Final Clinical Impression(s) / ED Diagnoses Final diagnoses:  Elbow pain, left  Left arm swelling    Rx / DC Orders ED Discharge Orders     None        Margarita Mail, PA-C 06/09/21 1616    Noemi Chapel, MD 06/11/21 [phone]

## 2021-06-11 ENCOUNTER — Ambulatory Visit (HOSPITAL_COMMUNITY): Admission: RE | Admit: 2021-06-11 | Payer: Medicare Other | Source: Ambulatory Visit

## 2021-06-17 ENCOUNTER — Other Ambulatory Visit: Payer: Self-pay

## 2021-06-17 ENCOUNTER — Ambulatory Visit (HOSPITAL_COMMUNITY)
Admission: RE | Admit: 2021-06-17 | Discharge: 2021-06-17 | Disposition: A | Discharge status: Home / Self Care | Payer: Medicare Other | Source: Ambulatory Visit | Attending: Emergency Medicine | Admitting: Emergency Medicine

## 2021-06-17 DIAGNOSIS — R600 Localized edema: Secondary | ICD-10-CM | POA: Diagnosis not present

## 2021-06-17 DIAGNOSIS — M79602 Pain in left arm: Secondary | ICD-10-CM | POA: Diagnosis not present

## 2021-06-17 NOTE — ED Provider Notes (Signed)
Patient was seen yesterday for left elbow and upper extremity swelling.  Ultrasound DVT was ordered as an outpatient, patient just had the ultrasound completed and it is negative for DVT.  Patient is in the waiting room.  Given the circumstances of increased volume, it was unclear if patient would be able to get back to the bedside quickly.  I decided to call her.  We spoke about patient's presenting symptoms, the plan to get ultrasound in the low concerns for infection by the primary team that saw her yesterday.  We discussed the ultrasound findings today.  Patient has a PCP, we have advised that she follows up with them for further evaluation and the symptoms do not get better with OTC medications.  Patient agrees and is appreciative of [place] calling her to go over the results.   Varney Biles, MD 06/17/21 1251

## 2021-06-18 DIAGNOSIS — M797 Fibromyalgia: Secondary | ICD-10-CM | POA: Diagnosis not present

## 2021-06-18 DIAGNOSIS — M545 Low back pain, unspecified: Secondary | ICD-10-CM | POA: Diagnosis not present

## 2021-06-18 DIAGNOSIS — I10 Essential (primary) hypertension: Secondary | ICD-10-CM | POA: Diagnosis not present

## 2021-06-18 DIAGNOSIS — M25569 Pain in unspecified knee: Secondary | ICD-10-CM | POA: Diagnosis not present

## 2021-06-18 DIAGNOSIS — Z79891 Long term (current) use of opiate analgesic: Secondary | ICD-10-CM | POA: Diagnosis not present

## 2021-06-18 DIAGNOSIS — M130 Polyarthritis, unspecified: Secondary | ICD-10-CM | POA: Diagnosis not present

## 2021-07-01 ENCOUNTER — Telehealth: Payer: Self-pay | Admitting: *Deleted

## 2021-07-01 NOTE — Chronic Care Management (AMB) (Signed)
  Chronic Care Management   Outreach Note  07/01/2021 Name: Martha Montgomery MRN: [id number] DOB: August 03, 1980  Martha Montgomery is a 40 y.o. year old female who is a primary care patient of Moshe Cipro Norwood Levo, MD. I reached out to [name] by phone today in response to a referral sent by [name]'s primary care provider.  An unsuccessful telephone outreach was attempted today. The patient was referred to the case management team for assistance with care management and care coordination.   Follow Up Plan: A HIPAA compliant phone message was left for the patient providing contact information and requesting a return call.  The care management team will reach out to the patient again over the next 7 days.  If patient returns call to provider office, please advise to call [place]* at [phone].*  [place]  Direct Dial: [phone]

## 2021-07-04 ENCOUNTER — Telehealth: Payer: Self-pay | Admitting: Internal Medicine

## 2021-07-04 NOTE — Telephone Encounter (Signed)
Please call patient, she is having gi issues and wants some advise on what she can do before her upcoming appointment with Dr. Abbey Chatters

## 2021-07-04 NOTE — Telephone Encounter (Signed)
Dr. Abbey Chatters, This pt is scheduled to see you on 08/14/2021 (pt hasn't been seen in over a year). She advised me that yesterday she had a flair up of her chronic gastritis. She has abdomen soreness (no particular area-all over) she can also fill it in her back. She took 2 Amitiza and can only get out a little BM today. States her constipation is not letting her urinate like she should but when she is able to have a BM a little more urine comes out and it has a ammonia smell to it. She had some blood in BM yesterday, none today. She also had some nausea yesterday none today. Pt stated she was on her way to ER last night but turned around and went back home. Pt wants to know what she can do until her appt to see you. Please advise

## 2021-07-05 NOTE — Chronic Care Management (AMB) (Signed)
  Chronic Care Management   Note  07/05/2021 Name: Martha Montgomery MRN: [id number] DOB: 05/18/1981  Martha Montgomery is a 40 y.o. year old female who is a primary care patient of Moshe Cipro Norwood Levo, MD. I reached out to [name] by phone today in response to a referral sent by Martha Montgomery's PCP.  Martha Montgomery was given information about Chronic Care Management services today including:  CCM service includes personalized support from designated clinical staff supervised by her physician, including individualized plan of care and coordination with other care providers 24/7 contact phone numbers for assistance for urgent and routine care needs. Service will only be billed when office clinical staff spend 20 minutes or more in a month to coordinate care. Only one practitioner may furnish and bill the service in a calendar month. The patient may stop CCM services at any time (effective at the end of the month) by phone call to the office staff. The patient is responsible for co-pay (up to 20% after annual deductible is met) if co-pay is required by the individual health plan.   Patient agreed to services and verbal consent obtained.   Follow up plan: Telephone appointment with care management team member scheduled for:07/16/21  [place]: [phone]

## 2021-07-08 ENCOUNTER — Encounter (HOSPITAL_COMMUNITY): Payer: Self-pay | Admitting: *Deleted

## 2021-07-08 ENCOUNTER — Emergency Department (HOSPITAL_COMMUNITY)
Admission: EM | Admit: 2021-07-08 | Discharge: 2021-07-08 | Disposition: A | Discharge status: Home / Self Care | Payer: Medicare Other | Attending: Emergency Medicine | Admitting: Emergency Medicine

## 2021-07-08 DIAGNOSIS — K5900 Constipation, unspecified: Secondary | ICD-10-CM | POA: Insufficient documentation

## 2021-07-08 DIAGNOSIS — Z79899 Other long term (current) drug therapy: Secondary | ICD-10-CM | POA: Insufficient documentation

## 2021-07-08 DIAGNOSIS — E039 Hypothyroidism, unspecified: Secondary | ICD-10-CM | POA: Insufficient documentation

## 2021-07-08 DIAGNOSIS — I10 Essential (primary) hypertension: Secondary | ICD-10-CM | POA: Insufficient documentation

## 2021-07-08 DIAGNOSIS — K648 Other hemorrhoids: Secondary | ICD-10-CM | POA: Insufficient documentation

## 2021-07-08 DIAGNOSIS — Z87891 Personal history of nicotine dependence: Secondary | ICD-10-CM | POA: Insufficient documentation

## 2021-07-08 DIAGNOSIS — K219 Gastro-esophageal reflux disease without esophagitis: Secondary | ICD-10-CM | POA: Insufficient documentation

## 2021-07-08 DIAGNOSIS — R109 Unspecified abdominal pain: Secondary | ICD-10-CM | POA: Diagnosis present

## 2021-07-08 LAB — URINALYSIS, ROUTINE W REFLEX MICROSCOPIC
Bilirubin Urine: NEGATIVE
Glucose, UA: NEGATIVE mg/dL
Hgb urine dipstick: NEGATIVE
Ketones, ur: NEGATIVE mg/dL
Leukocytes,Ua: NEGATIVE
Nitrite: NEGATIVE
Protein, ur: NEGATIVE mg/dL
Specific Gravity, Urine: 1.013 (ref 1.005–1.030)
pH: 6 (ref 5.0–8.0)

## 2021-07-08 LAB — PREGNANCY, URINE: Preg Test, Ur: NEGATIVE

## 2021-07-08 MED ORDER — PEG 3350-KCL-NABCB-NACL-NASULF 236 G PO SOLR: ORAL | 0 refills | Status: DC

## 2021-07-08 NOTE — ED Provider Notes (Signed)
[facility] EMERGENCY DEPARTMENT Provider Note   CSN: [id number] Arrival date & time: 07/08/21  1300     History Chief Complaint  Patient presents with   Abdominal Pain   Constipation    Martha Montgomery is a 40 y.o. female.  Pt has multiple complaints.  Patient complains of having an exacerbation of gastritis.  Patient reports she has chronic gastritis and is on Protonix.   Patient reports she has felt some knots in her abdominal wall.  Patient reports she has felt a pea-sized area at her bellybutton and knots on the left side of her abdomen. She reports she has chronic constipation.  Patient reports no relief with over-the-counter medicines.  She recently has taken mineral oil without relief.  She reports MiraLAX does not work well for her. Patient is concerned about issues with her bladder.  She thinks she might have a bladder infection.  She is concerned that her bladder does not work as it is supposed to. Patient complains of internal hemorrhoids.  Patient reports she has fibromyalgia and feels like she has inflammation in her body. She complains of soreness to left elbow.  Patient has been taking Celebrex for tendinitis to her elbow without relief. She has called and scheduled to see her GI doctor.  She states she cannot be seen until a month from now. Patient also reports she has difficulty drinking water.  She feels like water does not go down like it is supposed to and she can only drink small amounts  The history is provided by the patient. No language interpreter was used.  Abdominal Pain Associated symptoms: constipation   Constipation Associated symptoms: abdominal pain       Past Medical History:  Diagnosis Date   Anxiety    Anxiety disorder    Arthritis    "knees, joints, hands, toes" (09/28/2013)   Depression    Depression, major, single episode, in partial remission ([place]) 10/18/2008   Qualifier: Diagnosis of  By: Moshe Cipro MD, Margaret  PHQ 9 score of 7 in  10/2017, not suicidal or homicidal, interested in telepsych however, score of 16 in  02/2018, not suicidal or homicidal, start medication and refer to telepsych Score of 7 in 07/2018    Fibromyalgia    Gastritis nov. 2011   EGD by Dr. Oneida Alar, negative h pylori   GERD (gastroesophageal reflux disease)    Gout    Hypertension x 6 years    Hypothyroidism    Migraines    "15/month average" (09/28/2013)   Obesity (BMI 30.0-34.9)    Spinal stenosis    Thyroid goiter     Patient Active Problem List   Diagnosis Date Noted   Acute vaginitis 02/17/2021   Hypertension    Finger pain, right 07/31/2020   Bilateral groin pain 07/31/2020   Depression, major, single episode, moderate ([place]) 01/01/2020   Dental caries 04/24/2019   Obesity (BMI 30.0-34.9) 01/08/2019   Multiple benign lumps of breast 08/17/2018   Hemorrhoids 05/19/2018   Encounter for contraceptive management 03/20/2018   Low vitamin D level 11/30/2017   Esophageal dysphagia 09/09/2017   Bloating 03/05/2017   Bilateral knee pain 01/05/2017   Amenorrhea 01/05/2017   Bilateral hip pain 07/22/2016   Post-surgical hypothyroidism 06/02/2014   Vaginitis 02/26/2013   HTN (hypertension), benign 06/30/2012   Knee pain 06/02/2012   Patellar malalignment syndrome 03/09/2012   IBS (irritable bowel syndrome) 08/29/2011   GERD (gastroesophageal reflux disease) 08/29/2011   Back pain 09/24/2010   Migraine  headache 04/02/2010   Constipation 09/12/2009   Allergic rhinitis 06/16/2009   Depression, major, single episode, in partial remission ([place]) 10/18/2008    Past Surgical History:  Procedure Laterality Date   BIOPSY  10/20/2017   Procedure: BIOPSY;  Surgeon: Danie Binder, MD;  Location: [facility];  Service: Endoscopy;;  gastric duodenal esophagus   CESAREAN SECTION  2008   CHONDROPLASTY  04/02/2012   Procedure: CHONDROPLASTY;  Surgeon: Carole Civil, MD;  Location: [facility];  Service: Orthopedics;  Laterality: Left;  of left  patella   COLONOSCOPY  May 2012   Dr. Oneida Alar: normal colon, small internal hemorrhoids   COLONOSCOPY WITH PROPOFOL N/A 10/22/2017   Procedure: COLONOSCOPY WITH PROPOFOL;  Surgeon: Danie Binder, MD;  Location: [facility];  Service: Endoscopy;  Laterality: N/A;  7:30am   COLONOSCOPY WITH PROPOFOL N/A 10/20/2017   Procedure: COLONOSCOPY WITH PROPOFOL;  Surgeon: Danie Binder, MD;  Location: [facility];  Service: Endoscopy;  Laterality: N/A;  11:00am   ESOPHAGOGASTRODUODENOSCOPY (EGD) WITH PROPOFOL N/A 10/20/2017   Procedure: ESOPHAGOGASTRODUODENOSCOPY (EGD) WITH PROPOFOL;  Surgeon: Danie Binder, MD;  Location: [facility];  Service: Endoscopy;  Laterality: N/A;   INCISION AND DRAINAGE ABSCESS  1997; 2005   "under right buttocks; under left arm"   SAVORY DILATION N/A 10/20/2017   Procedure: SAVORY DILATION;  Surgeon: Danie Binder, MD;  Location: [facility];  Service: Endoscopy;  Laterality: N/A;   THYROIDECTOMY N/A 09/28/2013   Procedure: TOTAL THYROIDECTOMY;  Surgeon: Ascencion Dike, MD;  Location: [facility] OR;  Service: ENT;  Laterality: N/A;   TOTAL THYROIDECTOMY  09/27/2013   UPPER GI ENDOSCOPY  2011   EGD: gastritis, no h.pylori     OB History     Gravida  1   Para  1   Term      Preterm      AB      Living  1      SAB      IAB      Ectopic      Multiple      Live Births              Family History  Problem Relation Age of Onset   Arthritis Mother    Migraines Mother    Hypertension Mother    Cancer Maternal Grandmother        lung   Asthma Other        family history    Colon cancer Neg Hx     Social History   Tobacco Use   Smoking status: Former    Packs/day: 1.00    Years: 5.00    Pack years: 5.00    Types: Cigarettes    Quit date: 09/11/2010    Years since quitting: 10.8   Smokeless tobacco: Never   Tobacco comments:    QUIT SMOKING X 3 YEARS AGO,was vaping but has since quit completely  Vaping Use   Vaping Use: Every day   Substance Use Topics   Alcohol use: No   Drug use: No    Home Medications Prior to Admission medications   Medication Sig Start Date End Date Taking? Authorizing Provider  amLODipine (NORVASC) 10 MG tablet TAKE 1 TABLET(10 MG) BY MOUTH DAILY 02/27/21   Fayrene Helper, MD  buPROPion (WELLBUTRIN XL) 300 MG 24 hr tablet TAKE 1 TABLET(300 MG) BY MOUTH DAILY 08/01/20   Fayrene Helper, MD  celecoxib (CELEBREX)  200 MG capsule Take 1 capsule (200 mg total) by mouth 2 (two) times daily. 06/09/21   Harris, Vernie Shanks, PA-C  fluticasone (FLONASE) 50 MCG/ACT nasal spray Place 1 spray into both nostrils daily as needed for allergies or rhinitis.    [provider]  levothyroxine (SYNTHROID) 200 MCG tablet TAKE 1 AND 1/2 TABLETS BY MOUTH EVERY DAY ON AN EMPTY STOMACH. DO NOT EAT. LESS THAN 2 HOURS AFTER TAKING THE MEDICATION 05/23/21   Fayrene Helper, MD  lubiprostone (AMITIZA) 24 MCG capsule Take 1 capsule (24 mcg total) by mouth 2 (two) times daily with a meal. 02/14/20   Carlis Stable, NP  medroxyPROGESTERone (DEPO-PROVERA) 150 MG/ML injection Inject 150 mg into the muscle every 3 (three) months.    [provider]  metoprolol tartrate (LOPRESSOR) 50 MG tablet TAKE 1 TABLET BY MOUTH TWICE DAILY 03/04/19   Fayrene Helper, MD  pantoprazole (PROTONIX) 40 MG tablet TAKE 1 TABLET BY MOUTH TWICE DAILY BEFORE A MEAL 01/07/21   Annitta Needs, NP  polyethylene glycol (MIRALAX / GLYCOLAX) 17 g packet Take 17 g by mouth daily.    [provider]  pregabalin (LYRICA) 100 MG capsule Take 100 mg by mouth 3 (three) times daily.    [provider]  rosuvastatin (CRESTOR) 10 MG tablet Take 1 tablet (10 mg total) by mouth daily. 10/29/20   Perlie Mayo, NP  traMADol (ULTRAM) 50 MG tablet Take by mouth every 12 (twelve) hours as needed.    [provider]  triamterene-hydrochlorothiazide (MAXZIDE) 75-50 MG tablet TAKE 1 TABLET BY MOUTH DAILY 05/14/21   Fayrene Helper, MD  venlafaxine [facility]) 37.5 MG tablet Take by mouth. Once per day and start twice a day next week 05/08/20   [provider]    Allergies    Codeine, Ibuprofen, and Soybean-containing drug products  Review of Systems   Review of Systems  Gastrointestinal:  Positive for abdominal pain and constipation.  All other systems reviewed and are negative.  Physical Exam Updated Vital Signs BP 128/88   Pulse 95   Temp 98.4 F (36.9 C) (Oral)   Resp 20   SpO2 99%   Physical Exam Vitals and nursing note reviewed.  Constitutional:      Appearance: She is well-developed.  HENT:     Head: Normocephalic.  Cardiovascular:     Rate and Rhythm: Normal rate.     Heart sounds: Normal heart sounds.  Pulmonary:     Effort: Pulmonary effort is normal.  Abdominal:     General: Abdomen is flat. There is no distension.     Palpations: Abdomen is soft. There is no mass.     Tenderness: There is generalized abdominal tenderness.     Hernia: No hernia is present.  Musculoskeletal:        General: Normal range of motion.     Cervical back: Normal range of motion.  Skin:    General: Skin is warm.  Neurological:     Mental Status: She is alert and oriented to person, place, and time.    ED Results / Procedures / Treatments   Labs (all labs ordered are listed, but only abnormal results are displayed) Labs Reviewed  URINALYSIS, ROUTINE W REFLEX MICROSCOPIC  PREGNANCY, URINE    EKG None  Radiology No results found.  Procedures Procedures   Medications Ordered in ED Medications - No data to display  ED Course  I have reviewed the triage vital signs  and the nursing notes.  Pertinent labs & imaging results that were available during my care of the patient were reviewed by me and considered in my medical decision making (see chart for details).    MDM Rules/Calculators/A&P                           MDM: Urinalysis obtained and results reviewed.  No evidence of  urinary tract infection urine pregnancy is negative.  Patient has chronic constipation.  I will try and her use GoLytely 8 ounces every hour until she has relief.  Patient advised if this does not alleviate her symptoms she should contact her GI doctor for further instructions.  Patient advised to follow-up with GI doctor as scheduled. Final Clinical Impression(s) / ED Diagnoses Final diagnoses:  Constipation, unspecified constipation type    Rx / DC Orders ED Discharge Orders     None     An After Visit Summary was printed and given to the patient.    Sidney Ace 07/08/21 2211    Daleen Bo, MD 07/09/21 1106

## 2021-07-08 NOTE — Discharge Instructions (Addendum)
Follow up with your Physician for recheck  

## 2021-07-08 NOTE — ED Triage Notes (Signed)
Low abdominal pain, has chronic gastritis

## 2021-07-16 ENCOUNTER — Ambulatory Visit (INDEPENDENT_AMBULATORY_CARE_PROVIDER_SITE_OTHER): Payer: Medicare Other | Admitting: Pharmacist

## 2021-07-16 DIAGNOSIS — I10 Essential (primary) hypertension: Secondary | ICD-10-CM

## 2021-07-16 DIAGNOSIS — E785 Hyperlipidemia, unspecified: Secondary | ICD-10-CM

## 2021-07-16 MED ORDER — AMLODIPINE BESYLATE 10 MG PO TABS: ORAL_TABLET | ORAL | 3 refills | Status: DC

## 2021-07-16 NOTE — Chronic Care Management (AMB) (Signed)
Chronic Care Management Pharmacy Note  07/16/2021 Name:  Martha Montgomery MRN:  [id number] DOB:  1980/09/28  Summary: Appointments needed (will message front desk to reach out to patient to schedule) Patient missed last appointment with PCP Patient overdue for Depo-Provera. Needs nurse visit.  Hypertension Blood pressure under good control. Blood pressure is at goal of <130/80 mmHg per 2017 AHA/ACC guidelines. Current medications: amlodipine 10 mg by mouth once daily, metoprolol tartrate 50 mg by mouth twice daily, and triamterene-HCTZ 75-2m by mouth once daily Taking medications as directed: no, patient reports she is out of refills of amlodipine and reports she thought she was told to discontinue metoprolol Continue amlodipine 10 mg by mouth once daily (refill sent today) and triamterene-HCTZ 75-545mby mouth once daily Given that patient has not taken metoprolol lately and blood pressure has been mostly well controlled, likely ok to discontinue but will confirm with PCP.  Hyperlipidemia Uncontrolled. LDL above goal of <100 due to moderate risk given <2 major CV risk factors (hypertension) and 10-year risk <10% per 2020 AACE/ACE guidelines. Triglycerides at goal of <150 per 2020 AACE/ACE guidelines. Current medications: rosuvastatin 10 mg by mouth once daily Continue rosuvastatin 10 mg by mouth once daily for now; however, if LDL remains elevated at next check will consider increasing rosuvastatin dose.  Encourage dietary reduction of high fat containing foods such as butter, nuts, bacon, egg yolks, etc. Recommend regular aerobic exercise Reviewed risks of hyperlipidemia, principles of treatment and consequences of untreated hyperlipidemia Discussed need for medication compliance Re-check lipid panel in 4-12 weeks  Subjective: Martha DEITERs an [age].o. year old female who is a primary patient of SiFayrene HelperMD.  The CCM team was consulted for assistance with disease  management and care coordination needs.    Engaged with patient by telephone for initial visit in response to provider referral for pharmacy case management and/or care coordination services.   Consent to Services:  The patient was given the following information about Chronic Care Management services today, agreed to services, and gave verbal consent: 1. CCM service includes personalized support from designated clinical staff supervised by the primary care provider, including individualized plan of care and coordination with other care providers 2. 24/7 contact phone numbers for assistance for urgent and routine care needs. 3. Service will only be billed when office clinical staff spend 20 minutes or more in a month to coordinate care. 4. Only one practitioner may furnish and bill the service in a calendar month. 5.The patient may stop CCM services at any time (effective at the end of the month) by phone call to the office staff. 6. The patient will be responsible for cost sharing (co-pay) of up to 20% of the service fee (after annual deductible is met). Patient agreed to services and consent obtained.  Patient Care Team: SiFayrene HelperMD as PCP - [facility], SaMarga MelnickMD (Inactive) (Gastroenterology) WaBeryle LatheRPSh[facility]riePharmacist)  Objective:  Lab Results  Component Value Date   CREATININE 0.97 02/15/2021   CREATININE 0.79 06/18/2020   CREATININE 0.95 11/16/2019    Lab Results  Component Value Date   HGBA1C 5.6 02/15/2021   Last diabetic Eye exam: No results found for: HMDIABEYEEXA  Last diabetic Foot exam: No results found for: HMDIABFOOTEX      Component Value Date/Time   CHOL 214 (H) 02/15/2021 0957   TRIG 76 02/15/2021 0957   HDL 57 02/15/2021 0957   CHOLHDL 3.8 02/15/2021  0957   CHOLHDL 3.5 06/18/2020 1556   VLDL 18 06/18/2020 1556   LDLCALC 144 (H) 02/15/2021 0957   LDLCALC 153 (H) 11/16/2019 1548    Hepatic Function Latest Ref Rng & Units 02/15/2021  06/18/2020 11/16/2019  Total Protein 6.0 - 8.5 g/dL 6.8 7.7 7.0  Albumin 3.8 - 4.8 g/dL 4.5 4.0 -  AST 0 - 40 IU/L 16 19 21   ALT 0 - 32 IU/L 17 14 13   Alk Phosphatase 44 - 121 IU/L 72 86 -  Total Bilirubin 0.0 - 1.2 mg/dL 0.2 0.4 0.4    Lab Results  Component Value Date/Time   TSH 14.800 (H) 02/15/2021 09:57 AM   TSH 8.83 (H) 11/16/2019 03:48 PM   FREET4 1.3 11/16/2019 03:48 PM   FREET4 1.27 11/16/2019 03:27 PM    CBC Latest Ref Rng & Units 02/15/2021 11/16/2019 11/18/2017  WBC 3.4 - 10.8 x10E3/uL 6.8 7.2 5.0  Hemoglobin 11.1 - 15.9 g/dL 13.3 12.2 12.7  Hematocrit 34.0 - 46.6 % 39.5 36.5 36.2  Platelets 150 - 450 x10E3/uL 469(H) 439(H) 405(H)    Lab Results  Component Value Date/Time   VD25OH 12.7 (L) 02/15/2021 09:57 AM   VD25OH 20 (L) 11/16/2019 03:48 PM    Clinical ASCVD: No  The ASCVD Risk score (Arnett DK, et al., 2019) failed to calculate for the following reasons:   The 2019 ASCVD risk score is only valid for ages 85 to 72   Social History   Tobacco Use  Smoking Status Former   Packs/day: 1.00   Years: 5.00   Pack years: 5.00   Types: Cigarettes   Quit date: 09/11/2010   Years since quitting: 10.8  Smokeless Tobacco Never  Tobacco Comments   QUIT SMOKING X 3 YEARS AGO,was vaping but has since quit completely   BP Readings from Last 3 Encounters:  07/08/21 129/80  06/09/21 (!) 162/99  08/30/20 115/87   Pulse Readings from Last 3 Encounters:  07/08/21 94  06/09/21 (!) 110  05/22/20 (!) 103   Wt Readings from Last 3 Encounters:  11/20/20 183 lb (83 kg)  08/30/20 180 lb (81.6 kg)  07/30/20 186 lb (84.4 kg)    Assessment: Review of patient past medical history, allergies, medications, health status, including review of consultants reports, laboratory and other test data, was performed as part of comprehensive evaluation and provision of chronic care management services.   SDOH:  (Social Determinants of Health) assessments and interventions performed:     CCM Care Plan  Allergies  Allergen Reactions   Aspirin     Other reaction(s): Bleeding (intolerance) Rectal bleeding   Codeine Itching   Diclofenac     gastritis   Ibuprofen Other (See Comments)    Stomach pain   Metronidazole     Patient cannot remember   Nsaids    Soybean-Containing Drug Products Other (See Comments)    Tongue swells a little and feels "rug burned".    Medications Reviewed Today     Reviewed by Beryle Lathe, [facility] (Pharmacist) on 07/16/21 at 1310  Med List Status: <None>   Medication Order Taking? Sig Documenting Provider Last Dose Status Informant  amLODipine (NORVASC) 10 MG tablet [id number] No TAKE 1 TABLET(10 MG) BY MOUTH DAILY  Patient not taking: Reported on 07/16/2021   Fayrene Helper, MD Not Taking Active Self           Med Note Beryle Lathe   Tue Jul 16, 2021  1:04 PM) Needs refill  diphenhydrAMINE (BENADRYL ALLERGY) 25 MG tablet [id number] Yes Take 25 mg by mouth in the morning and at bedtime. [provider] Taking Active   ergocalciferol (VITAMIN D2) 1.25 MG (50000 UT) capsule [id number] Yes Take 50,000 Units by mouth See admin instructions. Take 1 capsule every Monday [provider] Taking Active Self  fluticasone (FLONASE) 50 MCG/ACT nasal spray [id number] Yes Place 1 spray into both nostrils daily as needed for allergies or rhinitis. [provider] Taking Active Self  levothyroxine (SYNTHROID) 200 MCG tablet [id number] Yes TAKE 1 AND 1/2 TABLETS BY MOUTH EVERY DAY ON AN EMPTY STOMACH. DO NOT EAT. LESS THAN 2 HOURS AFTER TAKING THE MEDICATION Fayrene Helper, MD Taking Active Self  lubiprostone (AMITIZA) 24 MCG capsule [id number] Yes Take 1 capsule (24 mcg total) by mouth 2 (two) times daily with a meal.  Patient taking differently: Take 24 mcg by mouth every other day.   Carlis Stable, NP Taking Active Self  medroxyPROGESTERone (DEPO-PROVERA) 150 MG/ML injection [id number] Yes Inject 150 mg  into the muscle every 3 (three) months. [provider] Taking Active Self  medroxyPROGESTERone (DEPO-PROVERA) injection 150 mg [id number]   Fayrene Helper, MD  Active   metoprolol tartrate (LOPRESSOR) 50 MG tablet [id number] No TAKE 1 TABLET BY MOUTH TWICE DAILY  Patient not taking: Reported on 07/08/2021   Fayrene Helper, MD Not Taking Active Self  morphine (MSIR) 15 MG tablet [id number] Yes Take 15 mg by mouth 3 (three) times daily as needed. [provider] Taking Active Self  pantoprazole (PROTONIX) 40 MG tablet [id number] Yes TAKE 1 TABLET BY MOUTH TWICE DAILY BEFORE A MEAL Annitta Needs, NP Taking Active Self  polyethylene glycol (GOLYTELY) 236 g solution [id number] No Drink 8 ounces every hour until relief  Patient not taking: Reported on 07/16/2021   Fransico Meadow, PA-C Not Taking Active   polyethylene glycol (MIRALAX / GLYCOLAX) 17 g packet [id number] Yes Take 17 g by mouth daily. [provider] Taking Active Self  pregabalin (LYRICA) 100 MG capsule [id number] Yes Take 100 mg by mouth 3 (three) times daily. [provider] Taking Active Self  rosuvastatin (CRESTOR) 10 MG tablet [id number] Yes Take 1 tablet (10 mg total) by mouth daily. Perlie Mayo, NP Taking Active Self  traMADol Veatrice Bourbon) 50 MG tablet [id number] Yes Take by mouth every 12 (twelve) hours as needed. [provider] Taking Active Self           Med Note Beryle Lathe   Tue Jul 16, 2021 12:53 PM)    triamterene-hydrochlorothiazide (MAXZIDE) 75-50 MG tablet [id number] Yes TAKE 1 TABLET BY MOUTH DAILY Fayrene Helper, MD Taking Active Self  venlafaxine XR (EFFEXOR-XR) 150 MG 24 hr capsule [id number] Yes Take 150 mg by mouth daily. [provider] Taking Active             Patient Active Problem List   Diagnosis Date Noted   Acute vaginitis 02/17/2021   Hypertension    Finger pain, right 07/31/2020   Bilateral groin pain 07/31/2020    Depression, major, single episode, moderate ([place]) 01/01/2020   Dental caries 04/24/2019   Obesity (BMI 30.0-34.9) 01/08/2019   Multiple benign lumps of breast 08/17/2018   Hemorrhoids 05/19/2018   Encounter for contraceptive management 03/20/2018   Low vitamin D level 11/30/2017   Esophageal dysphagia 09/09/2017   Bloating 03/05/2017   Bilateral knee pain 01/05/2017   Amenorrhea 01/05/2017   Bilateral hip pain 07/22/2016  Post-surgical hypothyroidism 06/02/2014   Vaginitis 02/26/2013   HTN (hypertension), benign 06/30/2012   Knee pain 06/02/2012   Patellar malalignment syndrome 03/09/2012   IBS (irritable bowel syndrome) 08/29/2011   GERD (gastroesophageal reflux disease) 08/29/2011   Back pain 09/24/2010   Migraine headache 04/02/2010   Constipation 09/12/2009   Allergic rhinitis 06/16/2009   Depression, major, single episode, in partial remission ([place]) 10/18/2008    Immunization History  Administered Date(s) Administered   Hpv-Unspecified 10/18/2008, 03/07/2009   Influenza Split 03/26/2016   Influenza Whole 06/07/2009, 03/28/2010   Influenza,inj,Quad PF,6+ Mos 05/30/2014, 06/28/2015, 06/22/2017, 03/16/2018, 04/20/2019, 03/27/2020   Influenza-Unspecified 03/28/2013   PFIZER(Purple Top)SARS-COV-2 Vaccination 02/07/2020, 03/05/2020   Td 03/07/2009    Conditions to be addressed/monitored: HTN and HLD  Care Plan : Medication Management  Updates made by Beryle Lathe, [place] since 07/16/2021 12:00 AM     Problem: HTN and HLD   Priority: High  Onset Date: 07/16/2021     Long-Range Goal: Disease Progression Prevention   Start Date: 07/16/2021  Expected End Date: 10/14/2021  This Visit's Progress: On track  Priority: High  Note:   Current Barriers:  Unable to achieve control of hyperlipidemia  Pharmacist Clinical Goal(s):  Patient will Achieve control of hyperlipidemia as evidenced by improved LDL through collaboration with PharmD and provider.    Interventions: 1:1 collaboration with Fayrene Helper, MD regarding development and update of comprehensive plan of care as evidenced by provider attestation and co-signature Inter-disciplinary care team collaboration (see longitudinal plan of care) Comprehensive medication review performed; medication list updated in electronic medical record  Hypertension - New goal.: Blood pressure under good control. Blood pressure is at goal of <130/80 mmHg per 2017 AHA/ACC guidelines. Current medications: amlodipine 10 mg by mouth once daily, metoprolol tartrate 50 mg by mouth twice daily, and triamterene-HCTZ 75-50mg  by mouth once daily Intolerances: none Taking medications as directed: no, patient reports she is out of refills of amlodipine and reports she thought she was told to discontinue metoprolol Side effects thought to be attributed to current medication regimen: no Reports dizziness/lightheadedness on exertion Current exercise: not discussed today Current home blood pressure: not discussed today Continue amlodipine 10 mg by mouth once daily (refill sent today) and triamterene-HCTZ 75-50mg  by mouth once daily Given that patient has not taken metoprolol lately and blood pressure has been mostly well controlled, likely ok to discontinue but will confirm with PCP. Encourage dietary sodium restriction/DASH diet Recommend regular aerobic exercise Recommend home blood pressure monitoring to discuss at next visit Discussed need for medication compliance  Hyperlipidemia - New goal.: Uncontrolled. LDL above goal of <100 due to moderate risk given <2 major CV risk factors (hypertension) and 10-year risk <10% per 2020 AACE/ACE guidelines. Triglycerides at goal of <150 per 2020 AACE/ACE guidelines. Current medications: rosuvastatin 10 mg by mouth once daily Intolerances: none Taking medications as directed: yes, per patient report but per fill history unclear (last fill for 30 day supply in July  2022).  Side effects thought to be attributed to current medication regimen: no Continue rosuvastatin 10 mg by mouth once daily for now; however, if LDL remains elevated at next check will consider increasing rosuvastatin dose.  Encourage dietary reduction of high fat containing foods such as butter, nuts, bacon, egg yolks, etc. Recommend regular aerobic exercise Reviewed risks of hyperlipidemia, principles of treatment and consequences of untreated hyperlipidemia Discussed need for medication compliance Re-check lipid panel in 4-12 weeks  Patient Goals/Self-Care Activities Patient will:  Focus on  medication adherence by keeping up with prescription refills and either using a pill box or reminders to take your medications at the prescribed times Check blood pressure at least once daily, document, and provide at future appointments Engage in dietary modifications by decreased fat intake  Follow Up Plan: Telephone follow up appointment with care management team member scheduled for: 10/15/21     Medication Assistance: None required.  Patient affirms current coverage meets needs.  Patient's preferred pharmacy is:  [id number] - [place], [place] - [address] AT [place]. [place] [address] Phone: [phone] Fax: [place], [place] [address] Phone: [phone] Fax: [phone]  Follow Up:  Patient agrees to Care Plan and Follow-up.  Plan: Telephone follow up appointment with care management team member scheduled for:  10/15/21  Kennon Holter, PharmD, [place], [place] Clinical Pharmacist Practitioner [facility] [phone]

## 2021-07-16 NOTE — Patient Instructions (Signed)
Martha Montgomery,  It was great to talk to you today!  Please call me with any questions or concerns.   Visit Information   Following is a copy of your full care plan:  Care Plan : Medication Management  Updates made by Beryle Lathe, [place] since 07/16/2021 12:00 AM     Problem: HTN and HLD   Priority: High  Onset Date: 07/16/2021     Long-Range Goal: Disease Progression Prevention   Start Date: 07/16/2021  Expected End Date: 10/14/2021  This Visit's Progress: On track  Priority: High  Note:   Current Barriers:  Unable to achieve control of hyperlipidemia  Pharmacist Clinical Goal(s):  Patient will Achieve control of hyperlipidemia as evidenced by improved LDL through collaboration with PharmD and provider.   Interventions: 1:1 collaboration with Fayrene Helper, MD regarding development and update of comprehensive plan of care as evidenced by provider attestation and co-signature Inter-disciplinary care team collaboration (see longitudinal plan of care) Comprehensive medication review performed; medication list updated in electronic medical record  Hypertension - New goal.: Blood pressure under good control. Blood pressure is at goal of <130/80 mmHg per 2017 AHA/ACC guidelines. Current medications: amlodipine 10 mg by mouth once daily, metoprolol tartrate 50 mg by mouth twice daily, and triamterene-HCTZ 75-31m by mouth once daily Intolerances: none Taking medications as directed: no, patient reports she is out of refills of amlodipine and reports she thought she was told to discontinue metoprolol Side effects thought to be attributed to current medication regimen: no Reports dizziness/lightheadedness on exertion Current exercise: not discussed today Current home blood pressure: not discussed today Continue amlodipine 10 mg by mouth once daily (refill sent today) and triamterene-HCTZ 75-524mby mouth once daily Given that patient has not taken metoprolol lately  and blood pressure has been mostly well controlled, likely ok to discontinue but will confirm with PCP. Encourage dietary sodium restriction/DASH diet Recommend regular aerobic exercise Recommend home blood pressure monitoring to discuss at next visit Discussed need for medication compliance  Hyperlipidemia - New goal.: Uncontrolled. LDL above goal of <100 due to moderate risk given <2 major CV risk factors (hypertension) and 10-year risk <10% per 2020 AACE/ACE guidelines. Triglycerides at goal of <150 per 2020 AACE/ACE guidelines. Current medications: rosuvastatin 10 mg by mouth once daily Intolerances: none Taking medications as directed: yes, per patient report but per fill history unclear (last fill for 30 day supply in July 2022).  Side effects thought to be attributed to current medication regimen: no Continue rosuvastatin 10 mg by mouth once daily for now; however, if LDL remains elevated at next check will consider increasing rosuvastatin dose.  Encourage dietary reduction of high fat containing foods such as butter, nuts, bacon, egg yolks, etc. Recommend regular aerobic exercise Reviewed risks of hyperlipidemia, principles of treatment and consequences of untreated hyperlipidemia Discussed need for medication compliance Re-check lipid panel in 4-12 weeks  Patient Goals/Self-Care Activities Patient will:  Focus on medication adherence by keeping up with prescription refills and either using a pill box or reminders to take your medications at the prescribed times Check blood pressure at least once daily, document, and provide at future appointments Engage in dietary modifications by decreased fat intake  Follow Up Plan: Telephone follow up appointment with care management team member scheduled for: 10/15/21     Consent to CCM Services: [name] given information about Chronic Care Management services including:  CCM service includes personalized support from designated  clinical staff supervised by her  physician, including individualized plan of care and coordination with other care providers 24/7 contact phone numbers for assistance for urgent and routine care needs. Service will only be billed when office clinical staff spend 20 minutes or more in a month to coordinate care. Only one practitioner may furnish and bill the service in a calendar month. The patient may stop CCM services at any time (effective at the end of the month) by phone call to the office staff. The patient will be responsible for cost sharing (co-pay) of up to 20% of the service fee (after annual deductible is met).  Patient agreed to services and verbal consent obtained.   Plan: Telephone follow up appointment with care management team member scheduled for:  10/15/21  Kennon Holter, PharmD, BCACP, CPP Clinical Pharmacist Practitioner [facility] [phone]  Please call the care guide team at [phone] if you need to cancel or reschedule your appointment.   Patient verbalizes understanding of instructions provided today and agrees to view in [place].

## 2021-07-17 NOTE — Progress Notes (Signed)
Schd appt, lvm for the pt to call me if that time was not good

## 2021-07-27 DIAGNOSIS — E785 Hyperlipidemia, unspecified: Secondary | ICD-10-CM | POA: Diagnosis not present

## 2021-07-27 DIAGNOSIS — I10 Essential (primary) hypertension: Secondary | ICD-10-CM | POA: Diagnosis not present

## 2021-07-31 ENCOUNTER — Ambulatory Visit: Payer: Medicare Other | Admitting: Family Medicine

## 2021-08-06 ENCOUNTER — Other Ambulatory Visit: Payer: Self-pay

## 2021-08-06 ENCOUNTER — Ambulatory Visit (INDEPENDENT_AMBULATORY_CARE_PROVIDER_SITE_OTHER): Payer: Medicare Other

## 2021-08-06 DIAGNOSIS — Z3040 Encounter for surveillance of contraceptives, unspecified: Secondary | ICD-10-CM | POA: Diagnosis not present

## 2021-08-06 LAB — POCT URINE PREGNANCY: Preg Test, Ur: NEGATIVE

## 2021-08-06 MED ORDER — MEDROXYPROGESTERONE ACETATE 150 MG/ML IM SUSP
150.0000 mg | Freq: Once | INTRAMUSCULAR | Status: AC
  Administered 2021-08-06: 150 mg via INTRAMUSCULAR

## 2021-08-09 DIAGNOSIS — M7702 Medial epicondylitis, left elbow: Secondary | ICD-10-CM | POA: Diagnosis not present

## 2021-08-09 DIAGNOSIS — M130 Polyarthritis, unspecified: Secondary | ICD-10-CM | POA: Diagnosis not present

## 2021-08-09 DIAGNOSIS — Z79891 Long term (current) use of opiate analgesic: Secondary | ICD-10-CM | POA: Diagnosis not present

## 2021-08-09 DIAGNOSIS — M545 Low back pain, unspecified: Secondary | ICD-10-CM | POA: Diagnosis not present

## 2021-08-14 ENCOUNTER — Ambulatory Visit: Payer: Medicare Other | Admitting: Internal Medicine

## 2021-08-14 ENCOUNTER — Ambulatory Visit: Payer: Medicare Other | Admitting: Gastroenterology

## 2021-09-03 ENCOUNTER — Other Ambulatory Visit: Payer: Self-pay

## 2021-09-03 ENCOUNTER — Ambulatory Visit (INDEPENDENT_AMBULATORY_CARE_PROVIDER_SITE_OTHER): Payer: Medicare Other | Admitting: Family Medicine

## 2021-09-03 ENCOUNTER — Encounter: Payer: Self-pay | Admitting: Family Medicine

## 2021-09-03 VITALS — BP 124/88 | HR 96 | Resp 16 | Ht 63.0 in | Wt 170.1 lb

## 2021-09-03 DIAGNOSIS — T520X1A Toxic effect of petroleum products, accidental (unintentional), initial encounter: Secondary | ICD-10-CM | POA: Insufficient documentation

## 2021-09-03 DIAGNOSIS — R7989 Other specified abnormal findings of blood chemistry: Secondary | ICD-10-CM | POA: Diagnosis not present

## 2021-09-03 DIAGNOSIS — K5900 Constipation, unspecified: Secondary | ICD-10-CM | POA: Diagnosis not present

## 2021-09-03 DIAGNOSIS — I10 Essential (primary) hypertension: Secondary | ICD-10-CM | POA: Diagnosis not present

## 2021-09-03 DIAGNOSIS — Z23 Encounter for immunization: Secondary | ICD-10-CM | POA: Diagnosis not present

## 2021-09-03 DIAGNOSIS — E669 Obesity, unspecified: Secondary | ICD-10-CM

## 2021-09-03 DIAGNOSIS — R253 Fasciculation: Secondary | ICD-10-CM | POA: Insufficient documentation

## 2021-09-03 NOTE — Assessment & Plan Note (Signed)
Updated lab needed at/ before next visit.   

## 2021-09-03 NOTE — Assessment & Plan Note (Signed)
Marked improvement   Patient re-educated about  the importance of commitment to a  minimum of 150 minutes of exercise per week as able.  The importance of healthy food choices with portion control discussed, as well as eating regularly and within a 12 hour window most days. The need to choose "clean , green" food 50 to 75% of the time is discussed, as well as to make water the primary drink and set a goal of 64 ounces water daily.    Weight /BMI 09/03/2021 11/20/2020 08/30/2020  WEIGHT 170 lb 1.9 oz 183 lb 180 lb  HEIGHT 5\' 3"  - 5\' 3"   BMI 30.14 kg/m2 32.42 kg/m2 31.89 kg/m2

## 2021-09-03 NOTE — Progress Notes (Signed)
Martha Montgomery     MRN: [id number]      DOB: [birth year]   HPI [name] is here for follow up and re-evaluation of chronic medical conditions, medication management and review of any available recent lab and radiology data.  Preventive health is updated, specifically  Cancer screening and Immunization.   Questions or concerns regarding consultations or procedures which the PT has had in the interim are  addressed. The PT denies any adverse reactions to current medications since the last visit.  4 day h/o left eyelid twitch C/o irritation from kerosene stove, drowsy, headaches and unable to get her Mother to stop using this , she is trying to move out which poses a problem of its own Weight loss through lifestyle change Thyroid disease currently controlled, saw Endo at tertiary center recently, takes med at night   ROS Denies recent fever or chills. Denies sinus pressure, nasal congestion, ear pain or sore throat. Denies chest congestion, productive cough or wheezing. Denies chest pains, palpitations and leg swelling Denies abdominal pain, nausea, vomiting,diarrhea or constipation.   Denies dysuria, frequency, hesitancy or incontinence. Denies joint pain, swelling and limitation in mobility. Denies , seizures, numbness, or tingling. Denies depression, anxiety or insomnia. Denies skin break down or rash.   PE  BP 124/88    Pulse 96    Resp 16    Ht 5\' 3"  (1.6 m)    Wt 170 lb 1.9 oz (77.2 kg)    SpO2 98%    BMI 30.14 kg/m   Patient alert and oriented and in no cardiopulmonary distress.  HEENT: No facial asymmetry, EOMI,     Neck supple .  Chest: Clear to auscultation bilaterally.  CVS: S1, S2 no murmurs, no S3.Regular rate.  ABD: Soft non tender.   Ext: No edema  MS: Adequate ROM spine, shoulders, hips and knees.  Skin: Intact, no ulcerations or rash noted.  Psych: Good eye contact, normal affect. Memory intact not anxious or depressed appearing.  CNS: CN 2-12 intact,  power,  normal throughout.no focal deficits noted.   Assessment & Plan  Twitching 4 day h/o left eye twitching, c/o kerosene exposure and drowsiness, refer to Neurology  Toxic effect of kerosene C/o headaches, sinus congestion , difficulty waking up, mother and stepdad not changing the kerosene in the house, plsan is to mover out the house   HTN (hypertension), benign DASH diet and commitment to daily physical activity for a minimum of 30 minutes discussed and encouraged, as a part of hypertension management. The importance of attaining a healthy weight is also discussed.  BP/Weight 09/03/2021 07/08/2021 06/09/2021 11/20/2020 08/30/2020 07/30/2020 13/24/4010  Systolic BP 272 536 644 - 034 742 595  Diastolic BP 88 80 99 - 87 86 96  Wt. (Lbs) 170.12 - - 183 180 186 188  BMI 30.14 - - 32.42 31.89 32.95 33.3      Controlled, no change in medication   Constipation Improved and controlled on medication   Low vitamin D level Updated lab needed at/ before next visit.   Obesity (BMI 30.0-34.9) Marked improvement   Patient re-educated about  the importance of commitment to a  minimum of 150 minutes of exercise per week as able.  The importance of healthy food choices with portion control discussed, as well as eating regularly and within a 12 hour window most days. The need to choose "clean , green" food 50 to 75% of the time is discussed, as well as to make water  the primary drink and set a goal of 64 ounces water daily.    Weight /BMI 09/03/2021 11/20/2020 08/30/2020  WEIGHT 170 lb 1.9 oz 183 lb 180 lb  HEIGHT 5\' 3"  - 5\' 3"   BMI 30.14 kg/m2 32.42 kg/m2 31.89 kg/m2

## 2021-09-03 NOTE — Assessment & Plan Note (Signed)
C/o headaches, sinus congestion , difficulty waking up, mother and stepdad not changing the kerosene in the house, plsan is to mover out the house

## 2021-09-03 NOTE — Assessment & Plan Note (Signed)
DASH diet and commitment to daily physical activity for a minimum of 30 minutes discussed and encouraged, as a part of hypertension management. The importance of attaining a healthy weight is also discussed.  BP/Weight 09/03/2021 07/08/2021 06/09/2021 11/20/2020 08/30/2020 07/30/2020 14/60/4799  Systolic BP 872 158 727 - 618 485 927  Diastolic BP 88 80 99 - 87 86 96  Wt. (Lbs) 170.12 - - 183 180 186 188  BMI 30.14 - - 32.42 31.89 32.95 33.3      Controlled, no change in medication

## 2021-09-03 NOTE — Assessment & Plan Note (Signed)
Improved and controlled on medication 

## 2021-09-03 NOTE — Assessment & Plan Note (Addendum)
4 day h/o left eye twitching, c/o kerosene exposure and drowsiness, refer to Neurology

## 2021-09-03 NOTE — Patient Instructions (Signed)
Annual exam in 6 months, call if you need me sooner  Fasting lipid, cmp and eGFR and Vit D 5  and cBC days before next visit  Flu vaccine today  Congrats on weight loss, keep it upu  You are referred to Dr Merlene Laughter  It is important that you exercise regularly at least 30 minutes 5 times a week. If you develop chest pain, have severe difficulty breathing, or feel very tired, stop exercising immediately and seek medical attention   Thanks for choosing [facility], we consider it a privelige to serve you.  Marland Kitchen

## 2021-10-14 ENCOUNTER — Other Ambulatory Visit: Payer: Self-pay | Admitting: Gastroenterology

## 2021-10-14 DIAGNOSIS — K581 Irritable bowel syndrome with constipation: Secondary | ICD-10-CM

## 2021-10-14 DIAGNOSIS — K625 Hemorrhage of anus and rectum: Secondary | ICD-10-CM

## 2021-10-14 DIAGNOSIS — K219 Gastro-esophageal reflux disease without esophagitis: Secondary | ICD-10-CM

## 2021-10-14 NOTE — Telephone Encounter (Signed)
Last office visit 01/2020

## 2021-10-15 ENCOUNTER — Ambulatory Visit (INDEPENDENT_AMBULATORY_CARE_PROVIDER_SITE_OTHER): Payer: Medicare Other | Admitting: Pharmacist

## 2021-10-15 DIAGNOSIS — I10 Essential (primary) hypertension: Secondary | ICD-10-CM

## 2021-10-15 DIAGNOSIS — E785 Hyperlipidemia, unspecified: Secondary | ICD-10-CM

## 2021-10-15 NOTE — Patient Instructions (Signed)
[name], ? ?It was great to talk to you today! ? ?Please call me with any questions or concerns. ? ?Visit Information ? ?Following are the goals we discussed today:  ? Goals Addressed   ? ?  ?  ?  ?  ? This Visit's Progress  ?  Medication Management     ?  Patient Goals/Self-Care Activities ?Patient will:  ?Focus on medication adherence by keeping up with prescription refills and either using a pill box or reminders to take your medications at the prescribed times ?Check blood pressure at least once daily, document, and provide at future appointments ?Engage in dietary modifications by decreased fat intake ? ?  ? ?  ?  ? ?Follow-up plan: Next PCP appointment scheduled for: 03/04/22 ? ?Patient verbalizes understanding of instructions and care plan provided today and agrees to view in [place]. Active MyChart status confirmed with patient.   ? ?Please call the care guide team at [phone] if you need to cancel or reschedule your appointment.  ? ?Kennon Holter, PharmD, BCACP, CPP ?Clinical Pharmacist Practitioner ?[place] ?[phone]  ?

## 2021-10-15 NOTE — Chronic Care Management (AMB) (Signed)
? ? ?Chronic Care Management ?Pharmacy Note ? ?10/15/2021 ?Name:  Martha Montgomery MRN:  [id number] DOB:  [birth year] ? ?Summary: ?Hypertension ?Blood pressure under good control. Blood pressure is at goal of <130/80 mmHg per 2017 AHA/ACC guidelines. ?Metoprolol was removed from medication list today since patient reports she has not taken this in a while since her primary care provider discontinued it. Blood pressure within normal limits and patient does not have any compelling indications for beta blocker therapy ?Continue amlodipine 10 mg by mouth once daily and triamterene-HCTZ 75-67m by mouth once daily ? ?Hyperlipidemia ?Uncontrolled. LDL above goal of <100 due to moderate risk given <2 major CV risk factors (hypertension) and 10-year risk <10% per 2020 AACE/ACE guidelines. ?Taking medications as directed: yes, per patient report but per fill history unclear ?Continue rosuvastatin 10 mg by mouth once daily for now; however, if LDL remains elevated at next check will consider increasing rosuvastatin dose.  ?Discussed need for medication compliance ?Re-check lipid panel at next primary care provider visit ? ?Subjective: ?Martha HAUSNERis an 41y.o. year old female who is a primary patient of SFayrene Helper MD.  The CCM team was consulted for assistance with disease management and care coordination needs.   ? ?Engaged with patient by telephone for follow up visit in response to provider referral for pharmacy case management and/or care coordination services.  ? ?Consent to Services:  ?The patient was given information about Chronic Care Management services, agreed to services, and gave verbal consent prior to initiation of services.  Please see initial visit note for detailed documentation.  ? ?Patient Care Team: ?SFayrene Helper MD as PCP - General ?FDanie Binder MD (Inactive) (Gastroenterology) ?WBeryle Lathe [facility](Pharmacist) ? ?Objective: ? ?Lab Results  ?Component Value Date  ? CREATININE  0.97 02/15/2021  ? CREATININE 0.79 06/18/2020  ? CREATININE 0.95 11/16/2019  ? ? ?Lab Results  ?Component Value Date  ? HGBA1C 5.6 02/15/2021  ? ?Last diabetic Eye exam: No results found for: HMDIABEYEEXA  ?Last diabetic Foot exam: No results found for: HMDIABFOOTEX  ? ?   ?Component Value Date/Time  ? CHOL 214 (H) 02/15/2021 0957  ? TRIG 76 02/15/2021 0957  ? HDL 57 02/15/2021 0957  ? CHOLHDL 3.8 02/15/2021 0957  ? CHOLHDL 3.5 06/18/2020 1556  ? VLDL 18 06/18/2020 1556  ? L[place]144 (H) 02/15/2021 0957  ? LDLCALC 153 (H) 11/16/2019 1548  ? ? ?Hepatic Function Latest Ref Rng & Units 02/15/2021 06/18/2020 11/16/2019  ?Total Protein 6.0 - 8.5 g/dL 6.8 7.7 7.0  ?Albumin 3.8 - 4.8 g/dL 4.5 4.0 -  ?AST 0 - 40 IU/L 16 19 21   ?ALT 0 - 32 IU/L 17 14 13   ?Alk Phosphatase 44 - 121 IU/L 72 86 -  ?Total Bilirubin 0.0 - 1.2 mg/dL 0.2 0.4 0.4  ? ? ?Lab Results  ?Component Value Date/Time  ? TSH 14.800 (H) 02/15/2021 09:57 AM  ? TSH 8.83 (H) 11/16/2019 03:48 PM  ? FREET4 1.3 11/16/2019 03:48 PM  ? FREET4 1.27 11/16/2019 03:27 PM  ? ? ?CBC Latest Ref Rng & Units 02/15/2021 11/16/2019 11/18/2017  ?WBC 3.4 - 10.8 x10E3/uL 6.8 7.2 5.0  ?Hemoglobin 11.1 - 15.9 g/dL 13.3 12.2 12.7  ?Hematocrit 34.0 - 46.6 % 39.5 36.5 36.2  ?Platelets 150 - 450 x10E3/uL 469(H) 439(H) 405(H)  ? ? ?Lab Results  ?Component Value Date/Time  ? VD25OH 12.7 (L) 02/15/2021 09:57 AM  ? VD25OH 20 (L) 11/16/2019 03:48 PM  ? ? ?  Clinical ASCVD: No  ?The ASCVD Risk score (Arnett DK, et al., 2019) failed to calculate for the following reasons: ?  The 2019 ASCVD risk score is only valid for ages 46 to 82   ? ?Social History  ? ?Tobacco Use  ?Smoking Status Former  ? Packs/day: 1.00  ? Years: 5.00  ? Pack years: 5.00  ? Types: Cigarettes  ? Quit date: 09/11/2010  ? Years since quitting: 11.1  ?Smokeless Tobacco Never  ?Tobacco Comments  ? QUIT SMOKING X 3 YEARS AGO,was vaping but has since quit completely  ? ?BP Readings from Last 3 Encounters:  ?09/03/21 124/88  ?07/08/21  129/80  ?06/09/21 (!) 162/99  ? ?Pulse Readings from Last 3 Encounters:  ?09/03/21 96  ?07/08/21 94  ?06/09/21 (!) 110  ? ?Wt Readings from Last 3 Encounters:  ?09/03/21 170 lb 1.9 oz (77.2 kg)  ?11/20/20 183 lb (83 kg)  ?08/30/20 180 lb (81.6 kg)  ? ? ?Assessment: Review of patient past medical history, allergies, medications, health status, including review of consultants reports, laboratory and other test data, was performed as part of comprehensive evaluation and provision of chronic care management services.  ? ?SDOH:  (Social Determinants of Health) assessments and interventions performed:  ? ? ?CCM Care Plan ? ?Allergies  ?Allergen Reactions  ? Aspirin   ?  Other reaction(s): Bleeding (intolerance) ?Rectal bleeding  ? Codeine Itching  ? Diclofenac   ?  gastritis  ? Ibuprofen Other (See Comments)  ?  Stomach pain  ? Metronidazole   ?  Patient cannot remember  ? Nsaids   ? Soybean-Containing Drug Products Other (See Comments)  ?  Tongue swells a little and feels "rug burned".  ? ? ?Medications Reviewed Today   ? ? Reviewed by Beryle Lathe, [facility] (Pharmacist) on 10/15/21 at 1309  Med List Status: <None>  ? ?Medication Order Taking? Sig Documenting Provider Last Dose Status Informant  ?amLODipine (NORVASC) 10 MG tablet [id number] Yes TAKE 1 TABLET(10 MG) BY MOUTH DAILY Fayrene Helper, MD Taking Active   ?diphenhydrAMINE (BENADRYL) 25 MG tablet [id number] Yes Take 25 mg by mouth in the morning and at bedtime. [provider] Taking Active   ?ergocalciferol (VITAMIN D2) 1.25 MG (50000 UT) capsule [id number] Yes Take 50,000 Units by mouth See admin instructions. Take 1 capsule every Monday [provider] Taking Active Self  ?fluticasone (FLONASE) 50 MCG/ACT nasal spray [id number] Yes Place 1 spray into both nostrils daily as needed for allergies or rhinitis. [provider] Taking Active Self  ?levothyroxine (SYNTHROID) 200 MCG tablet [id number] Yes TAKE 1 AND 1/2 TABLETS BY MOUTH  EVERY DAY ON AN EMPTY STOMACH. DO NOT EAT. LESS THAN 2 HOURS AFTER TAKING THE MEDICATION Fayrene Helper, MD Taking Active Self  ?lubiprostone (AMITIZA) 24 MCG capsule [id number] No Take 1 capsule (24 mcg total) by mouth 2 (two) times daily with a meal.  ?Patient not taking: Reported on 10/15/2021  ? Carlis Stable, NP Not Taking Active Self  ?medroxyPROGESTERone (DEPO-PROVERA) 150 MG/ML injection [id number] Yes Inject 150 mg into the muscle every 3 (three) months. [provider] Taking Active Self  ?medroxyPROGESTERone (DEPO-PROVERA) injection 150 mg [id number]   Fayrene Helper, MD  Active   ?morphine (MSIR) 15 MG tablet [id number] Yes Take 15 mg by mouth 3 (three) times daily as needed. [provider] Taking Active Self  ?pantoprazole (PROTONIX) 40 MG tablet [id number] Yes TAKE 1 TABLET BY MOUTH TWICE DAILY BEFORE A MEAL Roseanne Kaufman  W, NP Taking Active Self  ?polyethylene glycol (MIRALAX / GLYCOLAX) 17 g packet [id number] Yes Take 17 g by mouth daily. [provider] Taking Active Self  ?pregabalin (LYRICA) 150 MG capsule [id number] Yes Take 150 mg by mouth 3 (three) times daily. [provider] Taking Active   ?rosuvastatin (CRESTOR) 10 MG tablet [id number] Yes Take 1 tablet (10 mg total) by mouth daily. Perlie Mayo, NP Taking Active Self  ?traMADol (ULTRAM) 50 MG tablet [id number] Yes Take by mouth every 12 (twelve) hours as needed. [provider] Taking Active Self  ?         ?Med Note Beryle Lathe   Tue Jul 16, 2021 12:53 PM)    ?triamterene-hydrochlorothiazide (MAXZIDE) 75-50 MG tablet [id number] Yes TAKE 1 TABLET BY MOUTH DAILY Fayrene Helper, MD Taking Active Self  ? ?  ?  ? ?  ? ? ?Patient Active Problem List  ? Diagnosis Date Noted  ? Twitching 09/03/2021  ? Toxic effect of kerosene 09/03/2021  ? Hypertension   ? Finger pain, right 07/31/2020  ? Bilateral groin pain 07/31/2020  ? Depression, major, single episode, moderate ([place]) 01/01/2020  ?  Dental caries 04/24/2019  ? Obesity (BMI 30.0-34.9) 01/08/2019  ? Multiple benign lumps of breast 08/17/2018  ? Hemorrhoids 05/19/2018  ? Encounter for contraceptive management 03/20/2018  ? Low vitam

## 2021-10-25 DIAGNOSIS — E785 Hyperlipidemia, unspecified: Secondary | ICD-10-CM

## 2021-10-25 DIAGNOSIS — M25569 Pain in unspecified knee: Secondary | ICD-10-CM | POA: Diagnosis not present

## 2021-10-25 DIAGNOSIS — M545 Low back pain, unspecified: Secondary | ICD-10-CM | POA: Diagnosis not present

## 2021-10-25 DIAGNOSIS — M130 Polyarthritis, unspecified: Secondary | ICD-10-CM | POA: Diagnosis not present

## 2021-10-25 DIAGNOSIS — Z79899 Other long term (current) drug therapy: Secondary | ICD-10-CM | POA: Diagnosis not present

## 2021-10-25 DIAGNOSIS — Z79891 Long term (current) use of opiate analgesic: Secondary | ICD-10-CM | POA: Diagnosis not present

## 2021-10-25 DIAGNOSIS — M542 Cervicalgia: Secondary | ICD-10-CM | POA: Diagnosis not present

## 2021-10-25 DIAGNOSIS — I10 Essential (primary) hypertension: Secondary | ICD-10-CM

## 2021-10-28 ENCOUNTER — Ambulatory Visit (INDEPENDENT_AMBULATORY_CARE_PROVIDER_SITE_OTHER): Payer: Medicare Other

## 2021-10-28 DIAGNOSIS — Z0000 Encounter for general adult medical examination without abnormal findings: Secondary | ICD-10-CM | POA: Diagnosis not present

## 2021-10-28 NOTE — Patient Instructions (Signed)
?  Martha Montgomery , ?Thank you for taking time to come for your Medicare Wellness Visit. I appreciate your ongoing commitment to your health goals. Please review the following plan we discussed and let me know if I can assist you in the future.  ? ?These are the goals we discussed: ? Goals   ? ?  DIET - INCREASE WATER INTAKE   ?  Increase physical activity   ?  Medication Management   ?  Patient Goals/Self-Care Activities ?Patient will:  ?Focus on medication adherence by keeping up with prescription refills and either using a pill box or reminders to take your medications at the prescribed times ?Check blood pressure at least once daily, document, and provide at future appointments ?Engage in dietary modifications by decreased fat intake ? ?  ?  Patient Stated   ?  I want to start a plant based diet  ?  ?  Patient Stated   ?  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 ?  ? ?  ?  ?This is a list of the screening recommended for you and due dates:  ?Health Maintenance  ?Topic Date Due  ? Tetanus Vaccine  03/08/2019  ? COVID-19 Vaccine (3 - Pfizer risk series) 04/02/2020  ? Flu Shot  02/25/2022  ? Pap Smear  08/02/2022  ? Hepatitis C Screening: USPSTF Recommendation to screen - Ages 59-79 yo.  Completed  ? HIV Screening  Completed  ? HPV Vaccine  Aged Out  ?  ?

## 2021-10-28 NOTE — Progress Notes (Signed)
? ?Subjective:  ? Martha Montgomery is a 41 y.o. female who presents for Medicare Annual (Subsequent) preventive examination. ?I connected with  Martha Montgomery on 10/28/21 by a audio enabled telemedicine application and verified that I am speaking with the correct person using two identifiers. ? ?Patient Location: Home ? ?Provider Location: Office/Clinic ? ?I discussed the limitations of evaluation and management by telemedicine. The patient expressed understanding and agreed to proceed.  ?Review of Systems    ? ?  ? ?   ?Objective:  ?  ?There were no vitals filed for this visit. ?There is no height or weight on file to calculate BMI. ? ? ?  07/08/2021  ?  1:11 PM 06/09/2021  ?  3:32 PM 06/27/2020  ?  3:36 PM 03/18/2020  ?  7:47 PM 08/12/2018  ?  3:59 PM 06/08/2018  ?  6:45 PM 05/10/2018  ?  4:37 PM  ?Advanced Directives  ?Does Patient Have a Medical Advance Directive? No No No No Yes No No  ?Does patient want to make changes to medical advance directive?     Yes (ED - Information included in AVS)    ?Would patient like information on creating a medical advance directive? No - Patient declined No - Patient declined No - Patient declined No - Patient declined     ? ? ?Current Medications (verified) ?Outpatient Encounter Medications as of 10/28/2021  ?Medication Sig  ? amLODipine (NORVASC) 10 MG tablet TAKE 1 TABLET(10 MG) BY MOUTH DAILY  ? diphenhydrAMINE (BENADRYL) 25 MG tablet Take 25 mg by mouth in the morning and at bedtime.  ? ergocalciferol (VITAMIN D2) 1.25 MG (50000 UT) capsule Take 50,000 Units by mouth See admin instructions. Take 1 capsule every Monday  ? fluticasone (FLONASE) 50 MCG/ACT nasal spray Place 1 spray into both nostrils daily as needed for allergies or rhinitis.  ? levothyroxine (SYNTHROID) 200 MCG tablet TAKE 1 AND 1/2 TABLETS BY MOUTH EVERY DAY ON AN EMPTY STOMACH. DO NOT EAT. LESS THAN 2 HOURS AFTER TAKING THE MEDICATION  ? lubiprostone (AMITIZA) 24 MCG capsule Take 1 capsule (24 mcg total) by  mouth 2 (two) times daily with a meal. (Patient not taking: Reported on 10/15/2021)  ? medroxyPROGESTERone (DEPO-PROVERA) 150 MG/ML injection Inject 150 mg into the muscle every 3 (three) months.  ? morphine (MSIR) 15 MG tablet Take 15 mg by mouth 3 (three) times daily as needed.  ? pantoprazole (PROTONIX) 40 MG tablet TAKE 1 TABLET BY MOUTH TWICE DAILY BEFORE A MEAL  ? polyethylene glycol (MIRALAX / GLYCOLAX) 17 g packet Take 17 g by mouth daily.  ? pregabalin (LYRICA) 150 MG capsule Take 150 mg by mouth 3 (three) times daily.  ? rosuvastatin (CRESTOR) 10 MG tablet Take 1 tablet (10 mg total) by mouth daily.  ? traMADol (ULTRAM) 50 MG tablet Take by mouth every 12 (twelve) hours as needed.  ? triamterene-hydrochlorothiazide (MAXZIDE) 75-50 MG tablet TAKE 1 TABLET BY MOUTH DAILY  ? ?Facility-Administered Encounter Medications as of 10/28/2021  ?Medication  ? medroxyPROGESTERone (DEPO-PROVERA) injection 150 mg  ? ? ?Allergies (verified) ?Aspirin, Codeine, Diclofenac, Ibuprofen, Metronidazole, Nsaids, and Soybean-containing drug products  ? ?History: ?Past Medical History:  ?Diagnosis Date  ? Anxiety   ? Anxiety disorder   ? Arthritis   ? "knees, joints, hands, toes" (09/28/2013)  ? Depression   ? Depression, major, single episode, in partial remission ([place]) 10/18/2008  ? Qualifier: Diagnosis of  By: Moshe Cipro MD, Margaret  PHQ 9 score  of 7 in 10/2017, not suicidal or homicidal, interested in telepsych however, score of 16 in  02/2018, not suicidal or homicidal, start medication and refer to telepsych Score of 7 in 07/2018   ? Fibromyalgia   ? Gastritis nov. 2011  ? EGD by Dr. Oneida Alar, negative h pylori  ? GERD (gastroesophageal reflux disease)   ? Gout   ? Hypertension x 6 years   ? Hypothyroidism   ? Migraines   ? "15/month average" (09/28/2013)  ? Obesity (BMI 30.0-34.9)   ? Spinal stenosis   ? Thyroid goiter   ? ?Past Surgical History:  ?Procedure Laterality Date  ? BIOPSY  10/20/2017  ? Procedure: BIOPSY;  Surgeon: Danie Binder, MD;  Location: [facility];  Service: Endoscopy;;  gastric ?duodenal ?esophagus  ? CESAREAN SECTION  2008  ? CHONDROPLASTY  04/02/2012  ? Procedure: CHONDROPLASTY;  Surgeon: Carole Civil, MD;  Location: [facility] OR[facility];  Service: Orthopedics;  Laterality: Left;  of left patella  ? COLONOSCOPY  May 2012  ? Dr. Oneida Alar: normal colon, small internal hemorrhoids  ? COLONOSCOPY WITH PROPOFOL N/A 10/22/2017  ? Procedure: COLONOSCOPY WITH PROPOFOL;  Surgeon: Danie Binder, MD;  Location: [facility];  Service: Endoscopy;  Laterality: N/A;  7:30am  ? COLONOSCOPY WITH PROPOFOL N/A 10/20/2017  ? Procedure: COLONOSCOPY WITH PROPOFOL;  Surgeon: Danie Binder, MD;  Location: [facility];  Service: Endoscopy;  Laterality: N/A;  11:00am  ? ESOPHAGOGASTRODUODENOSCOPY (EGD) WITH PROPOFOL N/A 10/20/2017  ? Procedure: ESOPHAGOGASTRODUODENOSCOPY (EGD) WITH PROPOFOL;  Surgeon: Danie Binder, MD;  Location: [facility];  Service: Endoscopy;  Laterality: N/A;  ? INCISION AND DRAINAGE ABSCESS  1997; 2005  ? "under right buttocks; under left arm"  ? SAVORY DILATION N/A 10/20/2017  ? Procedure: SAVORY DILATION;  Surgeon: Danie Binder, MD;  Location: [facility];  Service: Endoscopy;  Laterality: N/A;  ? THYROIDECTOMY N/A 09/28/2013  ? Procedure: TOTAL THYROIDECTOMY;  Surgeon: Ascencion Dike, MD;  Location[facility] OR;  Service: ENT;  Laterality: N/A;  ? TOTAL THYROIDECTOMY  09/27/2013  ? UPPER GI ENDOSCOPY  2011  ? EGD: gastritis, no h.pylori  ? ?Family History  ?Problem Relation Age of Onset  ? Arthritis Mother   ? Migraines Mother   ? Hypertension Mother   ? Cancer Maternal Grandmother   ?     lung  ? Asthma Other   ?     family history   ? Colon cancer Neg Hx   ? ?Social History  ? ?Socioeconomic History  ? Marital status: Legally Separated  ?  Spouse name: Not on file  ? Number of children: 1  ? Years of education: 29  ? Highest education level: 12th grade  ?Occupational History  ? Occupation: Secondary school teacher of  [employer]  ?  Employer: UNEMPLOYED  ?Tobacco Use  ? Smoking status: Former  ?  Packs/day: 1.00  ?  Years: 5.00  ?  Pack years: 5.00  ?  Types: Cigarettes  ?  Quit date: 09/11/2010  ?  Years since quitting: 11.1  ? Smokeless tobacco: Never  ? Tobacco comments:  ?  QUIT SMOKING X 3 YEARS AGO,was vaping but has since quit completely  ?Vaping Use  ? Vaping Use: Every day  ?Substance and Sexual Activity  ? Alcohol use: No  ? Drug use: No  ? Sexual activity: Not Currently  ?  Partners: Male  ?  Birth control/protection: Other-see comments, Injection  ?  Comment: Depo  ?Other Topics Concern  ? Not on file  ?Social History Narrative  ? Pt is currently on medicaid. From [place] and came to [facility] in mid 2010  ? Is separated from Husband, lives with Mother right now   ? ?Social Determinants of Health  ? ?Financial Resource Strain: Not on file  ?Food Insecurity: Not on file  ?Transportation Needs: Not on file  ?Physical Activity: Not on file  ?Stress: Not on file  ?Social Connections: Not on file  ? ? ?Tobacco Counseling ?Counseling given: Not Answered ?Tobacco comments: QUIT SMOKING X 3 YEARS AGO,was vaping but has since quit completely ? ? ?Clinical Intake: ? ?  ? ?  ? ?  ? ?  ? ?  ? ?Diabetic?no ? ?  ? ?  ? ? ?Activities of Daily Living ?   ? View : No data to display.  ?  ?  ?  ? ? ?Patient Care Team: ?Fayrene Helper, MD as PCP - General ?Danie Binder, MD (Inactive) (Gastroenterology) ?Beryle Lathe, [place] (Inactive) (Pharmacist) ? ?Indicate any recent Medical Services you may have received from other than [facility] providers in the past year (date may be approximate). ? ?   ?Assessment:  ? This is a routine wellness examination for [place]. ? ?Hearing/Vision screen ?No results found. ? ?Dietary issues and exercise activities discussed: ?  ? ? Goals Addressed   ?None ?  ? ?Depression Screen ? ?  02/13/2021  ?  8:58 AM 07/30/2020  ?  2:06 PM 05/22/2020  ? 11:02 AM 03/27/2020  ? 11:55 AM 03/27/2020  ? 11:25 AM 08/30/2019  ?   1:12 PM 08/03/2019  ?  1:58 PM  ?PHQ 2/9 Scores  ?PHQ - 2 Score 0 0 '2 2 2 '$ 0 0  ?PHQ- 9 Score   '7 7 6  '$ 0  ?  ?Fall Risk ? ?  02/13/2021  ?  8:58 AM 08/30/2020  ?  4:04 PM 05/22/2020  ? 10:27 AM 09/26/2019  ? 11:19

## 2021-11-22 ENCOUNTER — Other Ambulatory Visit: Payer: Self-pay | Admitting: Family Medicine

## 2021-12-10 ENCOUNTER — Ambulatory Visit: Payer: Medicare Other | Admitting: Gastroenterology

## 2022-01-02 ENCOUNTER — Ambulatory Visit (INDEPENDENT_AMBULATORY_CARE_PROVIDER_SITE_OTHER): Payer: Medicare Other

## 2022-01-02 DIAGNOSIS — Z3040 Encounter for surveillance of contraceptives, unspecified: Secondary | ICD-10-CM

## 2022-01-02 MED ORDER — MEDROXYPROGESTERONE ACETATE 150 MG/ML IM SUSP
150.0000 mg | Freq: Once | INTRAMUSCULAR | Status: AC
  Administered 2022-01-02: 150 mg via INTRAMUSCULAR

## 2022-01-12 ENCOUNTER — Encounter (HOSPITAL_COMMUNITY): Payer: Self-pay

## 2022-01-12 ENCOUNTER — Other Ambulatory Visit: Payer: Self-pay

## 2022-01-12 ENCOUNTER — Emergency Department (HOSPITAL_COMMUNITY)
Admission: EM | Admit: 2022-01-12 | Discharge: 2022-01-12 | Disposition: A | Discharge status: Home / Self Care | Payer: Medicare Other | Attending: Emergency Medicine | Admitting: Emergency Medicine

## 2022-01-12 ENCOUNTER — Emergency Department (HOSPITAL_COMMUNITY): Payer: Medicare Other

## 2022-01-12 DIAGNOSIS — Z79899 Other long term (current) drug therapy: Secondary | ICD-10-CM | POA: Diagnosis not present

## 2022-01-12 DIAGNOSIS — T7840XA Allergy, unspecified, initial encounter: Secondary | ICD-10-CM | POA: Insufficient documentation

## 2022-01-12 DIAGNOSIS — I10 Essential (primary) hypertension: Secondary | ICD-10-CM | POA: Diagnosis not present

## 2022-01-12 DIAGNOSIS — R0789 Other chest pain: Secondary | ICD-10-CM | POA: Diagnosis not present

## 2022-01-12 DIAGNOSIS — R079 Chest pain, unspecified: Secondary | ICD-10-CM | POA: Diagnosis not present

## 2022-01-12 LAB — CBC
HCT: 38.7 % (ref 36.0–46.0)
Hemoglobin: 13.1 g/dL (ref 12.0–15.0)
MCH: 30.8 pg (ref 26.0–34.0)
MCHC: 33.9 g/dL (ref 30.0–36.0)
MCV: 90.8 fL (ref 80.0–100.0)
Platelets: 392 10*3/uL (ref 150–400)
RBC: 4.26 MIL/uL (ref 3.87–5.11)
RDW: 13.2 % (ref 11.5–15.5)
WBC: 7.8 10*3/uL (ref 4.0–10.5)
nRBC: 0 % (ref 0.0–0.2)

## 2022-01-12 LAB — TROPONIN I (HIGH SENSITIVITY)
Troponin I (High Sensitivity): <2 ng/L (ref <18)
Troponin I (High Sensitivity): <2 ng/L (ref <18)

## 2022-01-12 LAB — BASIC METABOLIC PANEL
Anion gap: 7 (ref 5–15)
BUN: 12 mg/dL (ref 6–20)
CO2: 25 mmol/L (ref 22–32)
Calcium: 8.3 mg/dL — ABNORMAL LOW (ref 8.9–10.3)
Chloride: 105 mmol/L (ref 98–111)
Creatinine, Ser: 1.01 mg/dL — ABNORMAL HIGH (ref 0.44–1.00)
GFR, Estimated: >60 mL/min (ref >60)
Glucose, Bld: 95 mg/dL (ref 70–99)
Potassium: 3.3 mmol/L — ABNORMAL LOW (ref 3.5–5.1)
Sodium: 137 mmol/L (ref 135–145)

## 2022-01-12 MED ORDER — EPINEPHRINE 0.3 MG/0.3ML IJ SOAJ: 0.3000 mg | INTRAMUSCULAR | 0 refills | Status: AC | PRN

## 2022-01-12 MED ORDER — PREDNISONE 20 MG PO TABS: 40.0000 mg | ORAL_TABLET | Freq: Every day | ORAL | 0 refills | Status: DC

## 2022-01-12 MED ORDER — PREDNISONE 50 MG PO TABS
60.0000 mg | ORAL_TABLET | Freq: Once | ORAL | Status: AC
  Administered 2022-01-12: 60 mg via ORAL
  Filled 2022-01-12: qty 1

## 2022-01-12 NOTE — ED Provider Notes (Signed)
[facility] EMERGENCY DEPARTMENT Provider Note   CSN: [id number] Arrival date & time: 01/12/22  0010     History  Chief Complaint  Patient presents with   Chest Pain    "Feels like throat is getting closing"- airway patent in triage    Martha Montgomery is a 41 y.o. female.  Patient presents to the emergency department for evaluation of chest pain.  Patient had onset of left-sided chest pain prior to arrival.  Pain has now resolved.  She reports that the pain was present for approximately an hour.  She has had this pain in the past but it has been sometime.  During that period of time she also noticed that it felt like her throat was swollen.  She does have a history of food allergies, did not eat anything that she is known to be allergic to.  She was feeling short of breath but also admits that she felt somewhat anxious at that time.  She had some twitching of her eye which has resolved.  No vision disturbance.  Patient then noticed that she had some arm pain, came to the ER for evaluation.       Home Medications Prior to Admission medications   Medication Sig Start Date End Date Taking? Authorizing Provider  EPINEPHrine 0.3 mg/0.3 mL IJ SOAJ injection Inject 0.3 mg into the muscle as needed for anaphylaxis. 01/12/22  Yes Kaula Klenke, Gwenyth Allegra, MD  predniSONE (DELTASONE) 20 MG tablet Take 2 tablets (40 mg total) by mouth daily with breakfast. 01/12/22  Yes Tashae Inda, Gwenyth Allegra, MD  amLODipine (NORVASC) 10 MG tablet TAKE 1 TABLET(10 MG) BY MOUTH DAILY 07/16/21   Fayrene Helper, MD  diphenhydrAMINE (BENADRYL) 25 MG tablet Take 25 mg by mouth in the morning and at bedtime.    [provider]  ergocalciferol (VITAMIN D2) 1.25 MG (50000 UT) capsule Take 50,000 Units by mouth See admin instructions. Take 1 capsule every Monday 05/31/21   [provider]  fluticasone (FLONASE) 50 MCG/ACT nasal spray Place 1 spray into both nostrils daily as needed for allergies or  rhinitis.    [provider]  levothyroxine (SYNTHROID) 200 MCG tablet TAKE 1 AND 1/2 TABLETS BY MOUTH EVERY DAY ON AN EMPTY STOMACH. DO NOT EAT. LESS THAN 2 HOURS AFTER TAKING THE MEDICATION 05/23/21   Fayrene Helper, MD  lubiprostone (AMITIZA) 24 MCG capsule Take 1 capsule (24 mcg total) by mouth 2 (two) times daily with a meal. 02/14/20   Carlis Stable, NP  medroxyPROGESTERone (DEPO-PROVERA) 150 MG/ML injection Inject 150 mg into the muscle every 3 (three) months.    [provider]  morphine (MSIR) 15 MG tablet Take 15 mg by mouth 3 (three) times daily as needed. 06/26/21   [provider]  pantoprazole (PROTONIX) 40 MG tablet TAKE 1 TABLET BY MOUTH TWICE DAILY BEFORE A MEAL 10/16/21   Mahala Menghini, PA-C  polyethylene glycol (MIRALAX / GLYCOLAX) 17 g packet Take 17 g by mouth daily.    [provider]  pregabalin (LYRICA) 150 MG capsule Take 150 mg by mouth 3 (three) times daily. 10/01/21   [provider]  rosuvastatin (CRESTOR) 10 MG tablet Take 1 tablet (10 mg total) by mouth daily. 10/29/20   Perlie Mayo, NP  traMADol (ULTRAM) 50 MG tablet Take by mouth every 12 (twelve) hours as needed.    [provider]  triamterene-hydrochlorothiazide (MAXZIDE) 75-50 MG tablet TAKE 1 TABLET BY MOUTH DAILY 11/22/21  Fayrene Helper, MD      Allergies    Aspirin, Codeine, Diclofenac, Ibuprofen, Metronidazole, Nsaids, and Soybean-containing drug products    Review of Systems   Review of Systems  Physical Exam Updated Vital Signs BP 129/90   Pulse 73   Temp 98.1 F (36.7 C) (Oral)   Resp 18   Ht '5\' 3"'$  (1.6 m)   Wt 81.6 kg   SpO2 99%   BMI 31.89 kg/m  Physical Exam Vitals and nursing note reviewed.  Constitutional:      General: She is not in acute distress.    Appearance: She is well-developed.  HENT:     Head: Normocephalic and atraumatic.     Mouth/Throat:     Mouth: Mucous membranes are moist.  Eyes:     General: Vision  grossly intact. Gaze aligned appropriately.     Extraocular Movements: Extraocular movements intact.     Conjunctiva/sclera: Conjunctivae normal.  Cardiovascular:     Rate and Rhythm: Normal rate and regular rhythm.     Pulses: Normal pulses.     Heart sounds: Normal heart sounds, S1 normal and S2 normal. No murmur heard.    No friction rub. No gallop.  Pulmonary:     Effort: Pulmonary effort is normal. No respiratory distress.     Breath sounds: Normal breath sounds.  Abdominal:     General: Bowel sounds are normal.     Palpations: Abdomen is soft.     Tenderness: There is no abdominal tenderness. There is no guarding or rebound.     Hernia: No hernia is present.  Musculoskeletal:        General: No swelling.     Cervical back: Full passive range of motion without pain, normal range of motion and neck supple. No spinous process tenderness or muscular tenderness. Normal range of motion.     Right lower leg: No edema.     Left lower leg: No edema.  Skin:    General: Skin is warm and dry.     Capillary Refill: Capillary refill takes less than 2 seconds.     Findings: No ecchymosis, erythema, rash or wound.  Neurological:     General: No focal deficit present.     Mental Status: She is alert and oriented to person, place, and time.     GCS: GCS eye subscore is 4. GCS verbal subscore is 5. GCS motor subscore is 6.     Cranial Nerves: Cranial nerves 2-12 are intact.     Sensory: Sensation is intact.     Motor: Motor function is intact.     Coordination: Coordination is intact.  Psychiatric:        Attention and Perception: Attention normal.        Mood and Affect: Mood normal.        Speech: Speech normal.        Behavior: Behavior normal.     ED Results / Procedures / Treatments   Labs (all labs ordered are listed, but only abnormal results are displayed) Labs Reviewed  BASIC METABOLIC PANEL - Abnormal; Notable for the following components:      Result Value   Potassium 3.3  (*)    Creatinine, Ser 1.01 (*)    Calcium 8.3 (*)    All other components within normal limits  CBC  POC URINE PREG, ED  TROPONIN I (HIGH SENSITIVITY)  TROPONIN I (HIGH SENSITIVITY)    EKG EKG Interpretation  Date/Time:  Sunday January 12 2022 00:28:03 EDT Ventricular Rate:  90 PR Interval:  180 QRS Duration: 78 QT Interval:  368 QTC Calculation: 450 R Axis:   56 Text Interpretation: Normal sinus rhythm Normal ECG Confirmed by Orpah Greek [phone] on 01/12/2022 2:19:29 AM  Radiology DG Chest 2 View  Result Date: 01/12/2022 CLINICAL DATA:  Left-sided chest pain and left arm pain. EXAM: CHEST - 2 VIEW COMPARISON:  None Available. FINDINGS: The heart size and mediastinal contours are within normal limits. Both lungs are clear. The visualized skeletal structures are unremarkable. IMPRESSION: No active cardiopulmonary disease. Electronically Signed   By: Lucienne Capers M.D.   On: 01/12/2022 01:15    Procedures Procedures    Medications Ordered in ED Medications  predniSONE (DELTASONE) tablet 60 mg (60 mg Oral Given 01/12/22 0251)    ED Course/ Medical Decision Making/ A&P                           Medical Decision Making Amount and/or Complexity of Data Reviewed Labs: ordered. Radiology: ordered.   Presents for evaluation of chest pain.  Patient with minimal cardiac risk factors, specifically hypertension.  Cardiac evaluation unremarkable.  Patient with symptoms of shortness of breath secondary to feeling like her throat was closing.  She took a Benadryl and the symptoms have resolved.  She does have a history of food allergies.  She did not break out in a rash or have any other obvious allergic reaction.  We will treat as allergic reaction with steroid, continue antihistamines.        Final Clinical Impression(s) / ED Diagnoses Final diagnoses:  Chest pain, unspecified type  Allergic reaction, initial encounter    Rx / DC Orders ED Discharge Orders           Ordered    predniSONE (DELTASONE) 20 MG tablet  Daily with breakfast        01/12/22 0527    EPINEPHrine 0.3 mg/0.3 mL IJ SOAJ injection  As needed        01/12/22 0527    Ambulatory referral to Allergy        01/12/22 [place], Asaiah Scarber J, MD 01/12/22 [phone]

## 2022-01-12 NOTE — ED Triage Notes (Signed)
Pt presents to Ed with c/o chest pain that started about an hour ago, left sided and left arm, pain is reproduced with palpation. Pt also reports sensation in lower throat "like it's closing up" that started 30 minutes after chest pain. This nurse Visualized throat in triage- no edema noted, no swelling to mouth tongue, or airway noted.  Pt has normal unlabored respirations. Lungs sounds clear bilaterally. Ascultation to throat WNL, no stridor noted. Pt talking and speaking full sentences.

## 2022-01-12 NOTE — Discharge Instructions (Addendum)
Benadryl every 6 hours for the next 2 days, then as needed for any allergic reaction symptoms.  Use the EpiPen for severe reactions including tongue swelling, throat swelling, difficulty breathing.

## 2022-01-17 DIAGNOSIS — M130 Polyarthritis, unspecified: Secondary | ICD-10-CM | POA: Diagnosis not present

## 2022-01-17 DIAGNOSIS — M545 Low back pain, unspecified: Secondary | ICD-10-CM | POA: Diagnosis not present

## 2022-01-17 DIAGNOSIS — Z79899 Other long term (current) drug therapy: Secondary | ICD-10-CM | POA: Diagnosis not present

## 2022-01-17 DIAGNOSIS — Z79891 Long term (current) use of opiate analgesic: Secondary | ICD-10-CM | POA: Diagnosis not present

## 2022-01-17 DIAGNOSIS — M25569 Pain in unspecified knee: Secondary | ICD-10-CM | POA: Diagnosis not present

## 2022-02-13 ENCOUNTER — Ambulatory Visit: Payer: Medicare Other | Admitting: Allergy

## 2022-02-13 NOTE — Progress Notes (Deleted)
New Patient Note  RE: Martha Montgomery MRN: [id number] DOB: 1980/10/22 Date of Office Visit: 02/13/2022  Consult requested by: Orpah Greek Primary care provider: Fayrene Helper, MD  Chief Complaint: No chief complaint on file.  History of Present Illness: I had the pleasure of seeing Martha Montgomery for initial evaluation at the [facility] on 02/13/2022. She is a 41 y.o. female, who is referred here by Fayrene Helper, MD for the evaluation of ***.  ***  01/12/2022 ER visit: "Patient presents to the emergency department for evaluation of chest pain.  Patient had onset of left-sided chest pain prior to arrival.  Pain has now resolved.  She reports that the pain was present for approximately an hour.  She has had this pain in the past but it has been sometime.  During that period of time she also noticed that it felt like her throat was swollen.  She does have a history of food allergies, did not eat anything that she is known to be allergic to.  She was feeling short of breath but also admits that she felt somewhat anxious at that time.  She had some twitching of her eye which has resolved.  No vision disturbance.  Patient then noticed that she had some arm pain, came to the ER for evaluation."  Assessment and Plan: Kordelia is a 41 y.o. female with: No problem-specific Assessment & Plan notes found for this encounter.  No follow-ups on file.  No orders of the defined types were placed in this encounter.  Lab Orders  No laboratory test(s) ordered today    Other allergy screening: Asthma: {Blank single:19197::"yes","no"} Rhino conjunctivitis: {Blank single:19197::"yes","no"} Food allergy: {Blank single:19197::"yes","no"} Medication allergy: {Blank single:19197::"yes","no"} Hymenoptera allergy: {Blank single:19197::"yes","no"} Urticaria: {Blank single:19197::"yes","no"} Eczema:{Blank single:19197::"yes","no"} History of recurrent infections  suggestive of immunodeficency: {Blank single:19197::"yes","no"}  Diagnostics: Spirometry:  Tracings reviewed. Her effort: {Blank single:19197::"Good reproducible efforts.","It was hard to get consistent efforts and there is a question as to whether this reflects a maximal maneuver.","Poor effort, data can not be interpreted."} FVC: ***L FEV1: ***L, ***% predicted FEV1/FVC ratio: ***% Interpretation: {Blank single:19197::"Spirometry consistent with mild obstructive disease","Spirometry consistent with moderate obstructive disease","Spirometry consistent with severe obstructive disease","Spirometry consistent with possible restrictive disease","Spirometry consistent with mixed obstructive and restrictive disease","Spirometry uninterpretable due to technique","Spirometry consistent with normal pattern","No overt abnormalities noted given today's efforts"}.  Please see scanned spirometry results for details.  Skin Testing: {Blank single:19197::"Select foods","Environmental allergy panel","Environmental allergy panel and select foods","Food allergy panel","None","Deferred due to recent antihistamines use"}. *** Results discussed with patient/family.   Past Medical History: Patient Active Problem List   Diagnosis Date Noted   Twitching 09/03/2021   Toxic effect of kerosene 09/03/2021   Hypertension    Finger pain, right 07/31/2020   Bilateral groin pain 07/31/2020   Depression, major, single episode, moderate ([place]) 01/01/2020   Dental caries 04/24/2019   Obesity (BMI 30.0-34.9) 01/08/2019   Multiple benign lumps of breast 08/17/2018   Hemorrhoids 05/19/2018   Encounter for contraceptive management 03/20/2018   Low vitamin D level 11/30/2017   Esophageal dysphagia 09/09/2017   Bloating 03/05/2017   Bilateral knee pain 01/05/2017   Amenorrhea 01/05/2017   Bilateral hip pain 07/22/2016   Post-surgical hypothyroidism 06/02/2014   Vaginitis 02/26/2013   HTN (hypertension), benign  06/30/2012   Knee pain 06/02/2012   Patellar malalignment syndrome 03/09/2012   IBS (irritable bowel syndrome) 08/29/2011   GERD (gastroesophageal reflux disease) 08/29/2011   Back pain 09/24/2010  Migraine headache 04/02/2010   Constipation 09/12/2009   Allergic rhinitis 06/16/2009   Depression, major, single episode, in partial remission ([place]) 10/18/2008   Past Medical History:  Diagnosis Date   Anxiety    Anxiety disorder    Arthritis    "knees, joints, hands, toes" (09/28/2013)   Depression    Depression, major, single episode, in partial remission ([place]) 10/18/2008   Qualifier: Diagnosis of  By: Moshe Cipro MD, Margaret  PHQ 9 score of 7 in 10/2017, not suicidal or homicidal, interested in telepsych however, score of 16 in  02/2018, not suicidal or homicidal, start medication and refer to telepsych Score of 7 in 07/2018    Fibromyalgia    Gastritis nov. 2011   EGD by Dr. Oneida Alar, negative h pylori   GERD (gastroesophageal reflux disease)    Gout    Hypertension x 6 years    Hypothyroidism    Migraines    "15/month average" (09/28/2013)   Obesity (BMI 30.0-34.9)    Spinal stenosis    Thyroid goiter    Past Surgical History: Past Surgical History:  Procedure Laterality Date   BIOPSY  10/20/2017   Procedure: BIOPSY;  Surgeon: Danie Binder, MD;  [facility];  Service: Endoscopy;;  gastric duodenal esophagus   CESAREAN SECTION  2008   CHONDROPLASTY  04/02/2012   Procedure: CHONDROPLASTY;  Surgeon: Carole Civil, MD;  Location: [facility];  Service: Orthopedics;  Laterality: Left;  of left patella   COLONOSCOPY  May 2012   Dr. Oneida Alar: normal colon, small internal hemorrhoids   COLONOSCOPY WITH PROPOFOL N/A 10/22/2017   Procedure: COLONOSCOPY WITH PROPOFOL;  Surgeon: Danie Binder, MD;  Location: [facility];  Service: Endoscopy;  Laterality: N/A;  7:30am   COLONOSCOPY WITH PROPOFOL N/A 10/20/2017   Procedure: COLONOSCOPY WITH PROPOFOL;  Surgeon: Danie Binder,  MD;  [facility];  Service: Endoscopy;  Laterality: N/A;  11:00am   ESOPHAGOGASTRODUODENOSCOPY (EGD) WITH PROPOFOL N/A 10/20/2017   Procedure: ESOPHAGOGASTRODUODENOSCOPY (EGD) WITH PROPOFOL;  Surgeon: Danie Binder, MD;  [facility];  Service: Endoscopy;  Laterality: N/A;   INCISION AND DRAINAGE ABSCESS  1997; 2005   "under right buttocks; under left arm"   SAVORY DILATION N/A 10/20/2017   Procedure: SAVORY DILATION;  Surgeon: Danie Binder, MD;  [facility];  Service: Endoscopy;  Laterality: N/A;   THYROIDECTOMY N/A 09/28/2013   Procedure: TOTAL THYROIDECTOMY;  Surgeon: Ascencion Dike, MD;  Location: [facility];  Service: ENT;  Laterality: N/A;   TOTAL THYROIDECTOMY  09/27/2013   UPPER GI ENDOSCOPY  2011   EGD: gastritis, no h.pylori   Medication List:  Current Outpatient Medications  Medication Sig Dispense Refill   amLODipine (NORVASC) 10 MG tablet TAKE 1 TABLET(10 MG) BY MOUTH DAILY 90 tablet 3   diphenhydrAMINE (BENADRYL) 25 MG tablet Take 25 mg by mouth in the morning and at bedtime.     EPINEPHrine 0.3 mg/0.3 mL IJ SOAJ injection Inject 0.3 mg into the muscle as needed for anaphylaxis. 1 each 0   ergocalciferol (VITAMIN D2) 1.25 MG (50000 UT) capsule Take 50,000 Units by mouth See admin instructions. Take 1 capsule every Monday     fluticasone (FLONASE) 50 MCG/ACT nasal spray Place 1 spray into both nostrils daily as needed for allergies or rhinitis.     levothyroxine (SYNTHROID) 200 MCG tablet TAKE 1 AND 1/2 TABLETS BY MOUTH EVERY DAY ON AN EMPTY STOMACH. DO NOT EAT. LESS THAN  2 HOURS AFTER TAKING THE MEDICATION 135 tablet 0   lubiprostone (AMITIZA) 24 MCG capsule Take 1 capsule (24 mcg total) by mouth 2 (two) times daily with a meal. 180 capsule 3   medroxyPROGESTERone (DEPO-PROVERA) 150 MG/ML injection Inject 150 mg into the muscle every 3 (three) months.     morphine (MSIR) 15 MG tablet Take 15 mg by mouth 3 (three) times daily as needed.     pantoprazole  (PROTONIX) 40 MG tablet TAKE 1 TABLET BY MOUTH TWICE DAILY BEFORE A MEAL 180 tablet 0   polyethylene glycol (MIRALAX / GLYCOLAX) 17 g packet Take 17 g by mouth daily.     predniSONE (DELTASONE) 20 MG tablet Take 2 tablets (40 mg total) by mouth daily with breakfast. 10 tablet 0   pregabalin (LYRICA) 150 MG capsule Take 150 mg by mouth 3 (three) times daily.     rosuvastatin (CRESTOR) 10 MG tablet Take 1 tablet (10 mg total) by mouth daily. 30 tablet 3   traMADol (ULTRAM) 50 MG tablet Take by mouth every 12 (twelve) hours as needed.     triamterene-hydrochlorothiazide (MAXZIDE) 75-50 MG tablet TAKE 1 TABLET BY MOUTH DAILY 90 tablet 1   Current Facility-Administered Medications  Medication Dose Route Frequency Provider Last Rate Last Admin   medroxyPROGESTERone (DEPO-PROVERA) injection 150 mg  150 mg Intramuscular Once Fayrene Helper, MD       Allergies: Allergies  Allergen Reactions   Aspirin     Other reaction(s): Bleeding (intolerance) Rectal bleeding   Codeine Itching   Diclofenac     gastritis   Ibuprofen Other (See Comments)    Stomach pain   Metronidazole     Patient cannot remember   Nsaids    Soybean-Containing Drug Products Other (See Comments)    Tongue swells a little and feels "rug burned".   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  Tobacco Use   Smoking status: Former    Packs/day: 1.00    Years: 5.00    Total pack years: 5.00    Types: Cigarettes    Quit date: 09/11/2010    Years since quitting: 11.4   Smokeless tobacco: Never   Tobacco comments:    QUIT SMOKING X 3 YEARS AGO,was vaping but has since quit completely  Vaping Use   Vaping Use: Every day  Substance and  Sexual Activity   Alcohol use: No   Drug use: No   Sexual activity: Not Currently    Partners: Male    Birth control/protection: Other-see comments, Injection    Comment: Depo  Other Topics Concern   Not on file  Social History Narrative   Pt is currently on medicaid. From [place] and came to [facility] in mid 2010   Is separated from Husband, lives with Mother right now    Social Determinants of Health   Financial Resource Strain: Medium Risk (06/22/2017)   Overall Financial Resource Strain (CARDIA)    Difficulty of Paying Living Expenses: Somewhat hard  Food Insecurity: Food Insecurity Present (08/30/2020)   Hunger Vital Sign    Worried About Running Out of Food in the Last Year: Sometimes true    Ran Out of Food in the Last  Year: Sometimes true  Transportation Needs: No Transportation Needs (08/30/2020)   PRAPARE - Hydrologist (Medical): No    Lack of Transportation (Non-Medical): No  Physical Activity: Sufficiently Active (08/30/2020)   Exercise Vital Sign    Days of Exercise per Week: 6 days    Minutes of Exercise per Session: 80 min  Stress: No Stress Concern Present (08/12/2018)   [place]    Feeling of Stress : Only a little  Social Connections: Moderately Isolated (08/30/2020)   Social Connection and Isolation Panel [NHANES]    Frequency of Communication with Friends and Family: More than three times a week    Frequency of Social Gatherings with Friends and Family: More than three times a week    Attends Religious Services: More than 4 times per year    Active Member of [employer] or Organizations: No    Attends Archivist Meetings: Never    Marital Status: Separated   Lives in a ***. Smoking: *** Occupation: ***  Environmental HistoryFreight forwarder in the house: Estate agent in the family room: {Blank single:19197::"yes","no"} Carpet in the  bedroom: {Blank single:19197::"yes","no"} Heating: {Blank single:19197::"electric","gas","heat pump"} Cooling: {Blank single:19197::"central","window","heat pump"} Pet: {Blank single:19197::"yes ***","no"}  Family History: Family History  Problem Relation Age of Onset   Arthritis Mother    Migraines Mother    Hypertension Mother    Cancer Maternal Grandmother        lung   Asthma Other        family history    Colon cancer Neg Hx    Problem                               Relation Asthma                                   *** Eczema                                *** Food allergy                          *** Allergic rhino conjunctivitis     ***  Review of Systems  Constitutional:  Negative for appetite change, chills, fever and unexpected weight change.  HENT:  Negative for congestion and rhinorrhea.   Eyes:  Negative for itching.  Respiratory:  Negative for cough, chest tightness, shortness of breath and wheezing.   Cardiovascular:  Negative for chest pain.  Gastrointestinal:  Negative for abdominal pain.  Genitourinary:  Negative for difficulty urinating.  Skin:  Negative for rash.  Neurological:  Negative for headaches.    Objective: There were no vitals taken for this visit. There is no height or weight on file to calculate BMI. Physical Exam Vitals and nursing note reviewed.  Constitutional:      Appearance: Normal appearance. She is well-developed.  HENT:     Head: Normocephalic and atraumatic.     Right Ear: Tympanic membrane and external ear normal.     Left Ear: Tympanic membrane and external ear normal.     Nose: Nose normal.     Mouth/Throat:     Mouth: Mucous membranes are moist.  Pharynx: Oropharynx is clear.  Eyes:     Conjunctiva/sclera: Conjunctivae normal.  Cardiovascular:     Rate and Rhythm: Normal rate and regular rhythm.     Heart sounds: Normal heart sounds. No murmur heard.    No friction rub. No gallop.  Pulmonary:     Effort:  Pulmonary effort is normal.     Breath sounds: Normal breath sounds. No wheezing, rhonchi or rales.  Musculoskeletal:     Cervical back: Neck supple.  Skin:    General: Skin is warm.     Findings: No rash.  Neurological:     Mental Status: She is alert and oriented to person, place, and time.  Psychiatric:        Behavior: Behavior normal.    The plan was reviewed with the patient/family, and all questions/concerned were addressed.  It was my pleasure to see Gisele today and participate in her care. Please feel free to contact me with any questions or concerns.  Sincerely,  Rexene Alberts, DO Allergy & Immunology  [facility] office: [place] office: [phone]

## 2022-03-04 ENCOUNTER — Ambulatory Visit: Payer: Medicare Other | Admitting: Family Medicine

## 2022-03-13 ENCOUNTER — Ambulatory Visit (INDEPENDENT_AMBULATORY_CARE_PROVIDER_SITE_OTHER): Payer: Medicare Other | Admitting: Family Medicine

## 2022-03-13 ENCOUNTER — Encounter: Payer: Self-pay | Admitting: Family Medicine

## 2022-03-13 VITALS — BP 126/86 | HR 84 | Resp 16 | Ht 63.0 in | Wt 185.0 lb

## 2022-03-13 DIAGNOSIS — E669 Obesity, unspecified: Secondary | ICD-10-CM

## 2022-03-13 DIAGNOSIS — M2550 Pain in unspecified joint: Secondary | ICD-10-CM

## 2022-03-13 DIAGNOSIS — Z23 Encounter for immunization: Secondary | ICD-10-CM

## 2022-03-13 DIAGNOSIS — K5900 Constipation, unspecified: Secondary | ICD-10-CM

## 2022-03-13 DIAGNOSIS — I10 Essential (primary) hypertension: Secondary | ICD-10-CM | POA: Diagnosis not present

## 2022-03-13 DIAGNOSIS — E785 Hyperlipidemia, unspecified: Secondary | ICD-10-CM | POA: Diagnosis not present

## 2022-03-13 DIAGNOSIS — R7989 Other specified abnormal findings of blood chemistry: Secondary | ICD-10-CM

## 2022-03-13 DIAGNOSIS — Z3040 Encounter for surveillance of contraceptives, unspecified: Secondary | ICD-10-CM

## 2022-03-13 DIAGNOSIS — E66811 Obesity, class 1: Secondary | ICD-10-CM

## 2022-03-13 DIAGNOSIS — Z1231 Encounter for screening mammogram for malignant neoplasm of breast: Secondary | ICD-10-CM | POA: Diagnosis not present

## 2022-03-13 DIAGNOSIS — E890 Postprocedural hypothyroidism: Secondary | ICD-10-CM

## 2022-03-13 MED ORDER — MEDROXYPROGESTERONE ACETATE 150 MG/ML IM SUSP
150.0000 mg | Freq: Once | INTRAMUSCULAR | Status: AC
  Administered 2022-03-13: 150 mg via INTRAMUSCULAR

## 2022-03-13 NOTE — Patient Instructions (Signed)
F/U in office in 12 weeks, depo provera and MD visit, call if you need me sooner.hPV vaccine at visit  Please schedule mammogram at checkout  Fasting lipid, cmp and EGFR and Vit D in next 1 week  I will refer you back to Rheumatology at [facility] after I have reviewed your visit, you need to keep f/u with Endocrinology and Rheumatology   It is important that you exercise regularly at least 30 minutes 5 times a week. If you develop chest pain, have severe difficulty breathing, or feel very tired, stop exercising immediately and seek medical attention   Thanks for choosing [facility], we consider it a privelige to serve you.

## 2022-03-26 ENCOUNTER — Encounter: Payer: Self-pay | Admitting: Family Medicine

## 2022-03-26 ENCOUNTER — Other Ambulatory Visit: Payer: Self-pay | Admitting: Family Medicine

## 2022-03-26 ENCOUNTER — Telehealth: Payer: Self-pay | Admitting: Family Medicine

## 2022-03-26 DIAGNOSIS — I10 Essential (primary) hypertension: Secondary | ICD-10-CM

## 2022-03-26 DIAGNOSIS — E890 Postprocedural hypothyroidism: Secondary | ICD-10-CM

## 2022-03-26 DIAGNOSIS — R7989 Other specified abnormal findings of blood chemistry: Secondary | ICD-10-CM

## 2022-03-26 DIAGNOSIS — M2550 Pain in unspecified joint: Secondary | ICD-10-CM | POA: Insufficient documentation

## 2022-03-26 DIAGNOSIS — Z13220 Encounter for screening for lipoid disorders: Secondary | ICD-10-CM

## 2022-03-26 NOTE — Telephone Encounter (Signed)
Spoke with pt and she is aware.

## 2022-03-26 NOTE — Assessment & Plan Note (Signed)
Controlled, no change in medication DASH diet and commitment to daily physical activity for a minimum of 30 minutes discussed and encouraged, as a part of hypertension management. The importance of attaining a healthy weight is also discussed.     03/13/2022    4:27 PM 01/12/2022    6:03 AM 01/12/2022    2:45 AM 01/12/2022   12:44 AM 01/12/2022   12:39 AM 09/03/2021    4:09 PM 07/08/2021    5:09 PM  BP/Weight  Systolic BP 458 592 924  462 863 817  Diastolic BP 86 68 90  711 88 80  Wt. (Lbs) 185   180  170.12   BMI 32.77 kg/m2   31.89 kg/m2  30.14 kg/m2

## 2022-03-26 NOTE — Assessment & Plan Note (Signed)
  Patient re-educated about  the importance of commitment to a  minimum of 150 minutes of exercise per week as able.  The importance of healthy food choices with portion control discussed, as well as eating regularly and within a 12 hour window most days. The need to choose "clean , green" food 50 to 75% of the time is discussed, as well as to make water the primary drink and set a goal of 64 ounces water daily.       03/13/2022    4:27 PM 01/12/2022   12:44 AM 09/03/2021    4:09 PM  Weight /BMI  Weight 185 lb 180 lb 170 lb 1.9 oz  Height '5\' 3"'$  (1.6 m) '5\' 3"'$  (1.6 m) '5\' 3"'$  (1.6 m)  BMI 32.77 kg/m2 31.89 kg/m2 30.14 kg/m2

## 2022-03-26 NOTE — Telephone Encounter (Signed)
Pls add TSH, dx hypothyroidism and also ANA, dxgeneralized joint pain to lab order. I sent her a msg requesting that she get labs done!

## 2022-03-26 NOTE — Assessment & Plan Note (Signed)
Depo provera administered

## 2022-03-26 NOTE — Assessment & Plan Note (Signed)
H/o rash and generalized joint pain, check ANA

## 2022-03-26 NOTE — Progress Notes (Signed)
Martha Montgomery     MRN: [id number]      DOB: 1981-05-07   HPI Martha Montgomery is here for follow up and re-evaluation of chronic medical conditions, medication management and review of any available recent lab and radiology data.  Preventive health is updated, specifically  Cancer screening and Immunization.   Questions or concerns regarding consultations or procedures which the PT has had in the interim are  addressed. Is [facility] Has concerns about abn lab wih positive ANA, was being evaluated for lupus has not followed up with appt  ROS Denies recent fever or chills. Denies sinus pressure, nasal congestion, ear pain or sore throat. Denies chest congestion, productive cough or wheezing. Denies chest pains, palpitations and leg swelling Denies abdominal pain, nausea, vomiting,diarrhea or constipation.   Denies dysuria, frequency, hesitancy or incontinence. Chronic generalized  joint pain,  and limitation in mobility. Denies headaches, seizures, numbness, or tingling. Denies depression, anxiety or insomnia. H/o skin rash PE  BP 126/86   Pulse 84   Resp 16   Ht '5\' 3"'$  (1.6 m)   Wt 185 lb (83.9 kg)   SpO2 97%   BMI 32.77 kg/m   Patient alert and oriented and in no cardiopulmonary distress.  HEENT: No facial asymmetry, EOMI,     Neck supple .  Chest: Clear to auscultation bilaterally.  CVS: S1, S2 no murmurs, no S3.Regular rate.  ABD: Soft non tender.   Ext: No edema  MS: Adequate ROM spine, shoulders, hips and knees.  Skin: Intact, no ulcerations or rash noted.  Psych: Good eye contact, normal affect. Memory intact not anxious or depressed appearing.  CNS: CN 2-12 intact, power,  normal throughout.no focal deficits noted.   Assessment & Plan  Hypertension Controlled, no change in medication DASH diet and commitment to daily physical activity for a minimum of 30 minutes discussed and encouraged, as a part of  hypertension management. The importance of attaining a healthy weight is also discussed.     03/13/2022    4:27 PM 01/12/2022    6:03 AM 01/12/2022    2:45 AM 01/12/2022   12:44 AM 01/12/2022   12:39 AM 09/03/2021    4:09 PM 07/08/2021    5:09 PM  BP/Weight  Systolic BP 716 967 893  810 175 102  Diastolic BP 86 68 90  585 88 80  Wt. (Lbs) 185   180  170.12   BMI 32.77 kg/m2   31.89 kg/m2  30.14 kg/m2        Obesity (BMI 30.0-34.9)  Patient re-educated about  the importance of commitment to a  minimum of 150 minutes of exercise per week as able.  The importance of healthy food choices with portion control discussed, as well as eating regularly and within a 12 hour window most days. The need to choose "clean , green" food 50 to 75% of the time is discussed, as well as to make water the primary drink and set a goal of 64 ounces water daily.       03/13/2022    4:27 PM 01/12/2022   12:44 AM 09/03/2021    4:09 PM  Weight /BMI  Weight 185 lb 180 lb 170 lb 1.9 oz  Height '5\' 3"'$  (1.6 m) '5\' 3"'$  (1.6 m) '5\' 3"'$  (1.6 m)  BMI 32.77 kg/m2 31.89 kg/m2 30.14 kg/m2      Post-surgical hypothyroidism Managed by endo, will update lab  Constipation Controlled, no change  in medication   Generalized joint pain H/o rash and generalized joint pain, check ANA  Encounter for contraceptive management Depo provera administered

## 2022-03-26 NOTE — Assessment & Plan Note (Signed)
Controlled, no change in medication  

## 2022-03-26 NOTE — Assessment & Plan Note (Signed)
Managed by endo, will update lab

## 2022-03-27 DIAGNOSIS — I10 Essential (primary) hypertension: Secondary | ICD-10-CM | POA: Diagnosis not present

## 2022-03-27 DIAGNOSIS — E890 Postprocedural hypothyroidism: Secondary | ICD-10-CM | POA: Diagnosis not present

## 2022-03-27 DIAGNOSIS — Z13220 Encounter for screening for lipoid disorders: Secondary | ICD-10-CM | POA: Diagnosis not present

## 2022-03-27 DIAGNOSIS — R7989 Other specified abnormal findings of blood chemistry: Secondary | ICD-10-CM | POA: Diagnosis not present

## 2022-03-27 DIAGNOSIS — M2550 Pain in unspecified joint: Secondary | ICD-10-CM | POA: Diagnosis not present

## 2022-03-29 LAB — VITAMIN D 25 HYDROXY (VIT D DEFICIENCY, FRACTURES): Vit D, 25-Hydroxy: 23.3 ng/mL — ABNORMAL LOW (ref 30.0–100.0)

## 2022-03-29 LAB — CMP14+EGFR
ALT: 19 IU/L (ref 0–32)
AST: 26 IU/L (ref 0–40)
Albumin/Globulin Ratio: 1.8 (ref 1.2–2.2)
Albumin: 4.3 g/dL (ref 3.9–4.9)
Alkaline Phosphatase: 48 IU/L (ref 44–121)
BUN/Creatinine Ratio: 10 (ref 9–23)
BUN: 10 mg/dL (ref 6–20)
Bilirubin Total: 0.2 mg/dL (ref 0.0–1.2)
CO2: 16 mmol/L — ABNORMAL LOW (ref 20–29)
Calcium: 9.1 mg/dL (ref 8.7–10.2)
Chloride: 106 mmol/L (ref 96–106)
Creatinine, Ser: 1 mg/dL (ref 0.57–1.00)
Globulin, Total: 2.4 g/dL (ref 1.5–4.5)
Glucose: 93 mg/dL (ref 70–99)
Potassium: 4.2 mmol/L (ref 3.5–5.2)
Sodium: 141 mmol/L (ref 134–144)
Total Protein: 6.7 g/dL (ref 6.0–8.5)
eGFR: 73 mL/min/{1.73_m2} (ref >59)

## 2022-03-29 LAB — LIPID PANEL
Chol/HDL Ratio: 3.8 ratio (ref 0.0–4.4)
Cholesterol, Total: 234 mg/dL — ABNORMAL HIGH (ref 100–199)
HDL: 61 mg/dL (ref >39)
LDL Chol Calc (NIH): 162 mg/dL — ABNORMAL HIGH (ref 0–99)
Triglycerides: 63 mg/dL (ref 0–149)
VLDL Cholesterol Cal: 11 mg/dL (ref 5–40)

## 2022-03-29 LAB — TSH: TSH: 38.5 u[IU]/mL — ABNORMAL HIGH (ref 0.450–4.500)

## 2022-03-29 LAB — ANA: Anti Nuclear Antibody (ANA): NEGATIVE

## 2022-04-07 ENCOUNTER — Ambulatory Visit (HOSPITAL_COMMUNITY): Payer: Medicare Other

## 2022-04-10 DIAGNOSIS — I10 Essential (primary) hypertension: Secondary | ICD-10-CM | POA: Diagnosis not present

## 2022-04-10 DIAGNOSIS — M25569 Pain in unspecified knee: Secondary | ICD-10-CM | POA: Diagnosis not present

## 2022-04-10 DIAGNOSIS — Z79891 Long term (current) use of opiate analgesic: Secondary | ICD-10-CM | POA: Diagnosis not present

## 2022-04-10 DIAGNOSIS — M130 Polyarthritis, unspecified: Secondary | ICD-10-CM | POA: Diagnosis not present

## 2022-04-10 DIAGNOSIS — M545 Low back pain, unspecified: Secondary | ICD-10-CM | POA: Diagnosis not present

## 2022-04-10 DIAGNOSIS — Z79899 Other long term (current) drug therapy: Secondary | ICD-10-CM | POA: Diagnosis not present

## 2022-04-15 ENCOUNTER — Encounter: Payer: Self-pay | Admitting: Gastroenterology

## 2022-04-15 ENCOUNTER — Other Ambulatory Visit: Payer: Self-pay | Admitting: Family Medicine

## 2022-04-15 ENCOUNTER — Other Ambulatory Visit: Payer: Self-pay

## 2022-04-15 DIAGNOSIS — R195 Other fecal abnormalities: Secondary | ICD-10-CM

## 2022-04-15 MED ORDER — ROSUVASTATIN CALCIUM 20 MG PO TABS: 20.0000 mg | ORAL_TABLET | Freq: Every day | ORAL | 5 refills | Status: DC

## 2022-04-15 NOTE — Progress Notes (Signed)
Refer gastro, c/o change in stool, green mucus

## 2022-04-15 NOTE — Progress Notes (Unsigned)
Referring Provider: Fayrene Helper, MD Primary Care Physician:  Fayrene Helper, MD Primary GI Physician: Dr. Abbey Chatters  No chief complaint on file.   HPI:   Martha Montgomery is a 41 y.o. female with history of IBS/C, rectal bleeding in the setting of hemorrhoids, GERD, presenting today for ***  Last seen in our office in July 2021.  Constipation not adequately managed as she had run out of Amitiza which previously worked well for her.  Intermittent mild hematochezia.  GERD symptoms doing well on Protonix twice daily.  Recommended resuming Amitiza 24 mcg twice daily, continue Protonix, and use Anusol rectal cream as needed for hemorrhoid symptoms.      Last colonoscopy 10/22/2017 revealing significant looping of the colon, internal hemorrhoids, otherwise normal exam.  Recommended 10-year surveillance. Last EGD 10/20/2017 with no endoscopic abnormality to explain dysphagia s/p empiric dilation, gastritis.  Esophageal biopsies consistent with reflux, gastric biopsies with gastric antral and oxyntic mucosa negative for H. pylori, duodenal biopsies benign.  Past Medical History:  Diagnosis Date   Anxiety    Anxiety disorder    Arthritis    "knees, joints, hands, toes" (09/28/2013)   Depression    Depression, major, single episode, in partial remission ([place]) 10/18/2008   Qualifier: Diagnosis of  By: Moshe Cipro MD, Margaret  PHQ 9 score of 7 in 10/2017, not suicidal or homicidal, interested in telepsych however, score of 16 in  02/2018, not suicidal or homicidal, start medication and refer to telepsych Score of 7 in 07/2018    Fibromyalgia    Gastritis nov. 2011   EGD by Dr. Oneida Alar, negative h pylori   GERD (gastroesophageal reflux disease)    Gout    Hypertension x 6 years    Hypothyroidism    Migraines    "15/month average" (09/28/2013)   Obesity (BMI 30.0-34.9)    Spinal stenosis    Thyroid goiter     Past Surgical History:  Procedure Laterality Date   BIOPSY  10/20/2017    Procedure: BIOPSY;  Surgeon: Danie Binder, MD;  Location: [facility];  Service: Endoscopy;;  gastric duodenal esophagus   CESAREAN SECTION  2008   CHONDROPLASTY  04/02/2012   Procedure: CHONDROPLASTY;  Surgeon: Carole Civil, MD;  Location: [facility];  Service: Orthopedics;  Laterality: Left;  of left patella   COLONOSCOPY  11/2010   Dr. Oneida Alar: normal colon, small internal hemorrhoids   COLONOSCOPY WITH PROPOFOL N/A 10/22/2017   Surgeon: Danie Binder, MD; significant looping of the colon, internal hemorrhoids, otherwise normal exam.  Recommended 10-year surveillance.   COLONOSCOPY WITH PROPOFOL N/A 10/20/2017   Procedure: COLONOSCOPY WITH PROPOFOL;  Surgeon: Danie Binder, MD;  Location: [facility];  Service: Endoscopy;  Laterality: N/A;  11:00am   ESOPHAGOGASTRODUODENOSCOPY (EGD) WITH PROPOFOL N/A 10/20/2017   Surgeon: Danie Binder, MD; no endoscopic abnormality to explain dysphagia s/p empiric dilation, gastritis.  Esophageal biopsies consistent with reflux, gastric biopsies with gastric antral and oxyntic mucosa negative for H. pylori, duodenal biopsies benign.   INCISION AND DRAINAGE ABSCESS  1997; 2005   "under right buttocks; under left arm"   SAVORY DILATION N/A 10/20/2017   Procedure: SAVORY DILATION;  Surgeon: Danie Binder, MD;  Location: [facility];  Service: Endoscopy;  Laterality: N/A;   THYROIDECTOMY N/A 09/28/2013   Procedure: TOTAL THYROIDECTOMY;  Surgeon: Ascencion Dike, MD;  Location: [place];  Service: ENT;  Laterality: N/A;   TOTAL THYROIDECTOMY  09/27/2013  UPPER GI ENDOSCOPY  2011   EGD: gastritis, no h.pylori    Current Outpatient Medications  Medication Sig Dispense Refill   amLODipine (NORVASC) 10 MG tablet TAKE 1 TABLET(10 MG) BY MOUTH DAILY 90 tablet 3   diphenhydrAMINE (BENADRYL) 25 MG tablet Take 25 mg by mouth in the morning and at bedtime.     EPINEPHrine 0.3 mg/0.3 mL IJ SOAJ injection Inject 0.3 mg into the muscle as needed for  anaphylaxis. 1 each 0   fluticasone (FLONASE) 50 MCG/ACT nasal spray Place 1 spray into both nostrils daily as needed for allergies or rhinitis.     levothyroxine (SYNTHROID) 200 MCG tablet TAKE 1 AND 1/2 TABLETS BY MOUTH EVERY DAY ON AN EMPTY STOMACH. DO NOT EAT. LESS THAN 2 HOURS AFTER TAKING THE MEDICATION 135 tablet 0   lubiprostone (AMITIZA) 24 MCG capsule Take 1 capsule (24 mcg total) by mouth 2 (two) times daily with a meal. 180 capsule 3   medroxyPROGESTERone (DEPO-PROVERA) 150 MG/ML injection Inject 150 mg into the muscle every 3 (three) months.     morphine (MSIR) 15 MG tablet Take 15 mg by mouth 3 (three) times daily as needed.     pantoprazole (PROTONIX) 40 MG tablet TAKE 1 TABLET BY MOUTH TWICE DAILY BEFORE A MEAL 180 tablet 0   polyethylene glycol (MIRALAX / GLYCOLAX) 17 g packet Take 17 g by mouth daily.     pregabalin (LYRICA) 150 MG capsule Take 150 mg by mouth 3 (three) times daily.     rosuvastatin (CRESTOR) 20 MG tablet Take 1 tablet (20 mg total) by mouth daily. 30 tablet 5   traMADol (ULTRAM) 50 MG tablet Take by mouth every 12 (twelve) hours as needed.     triamterene-hydrochlorothiazide (MAXZIDE) 75-50 MG tablet TAKE 1 TABLET BY MOUTH DAILY 90 tablet 1   Current Facility-Administered Medications  Medication Dose Route Frequency Provider Last Rate Last Admin   medroxyPROGESTERone (DEPO-PROVERA) injection 150 mg  150 mg Intramuscular Once Fayrene Helper, MD        Allergies as of 04/17/2022 - Review Complete 03/26/2022  Allergen Reaction Noted   Aspirin  05/23/2020   Codeine Itching    Diclofenac  07/16/2021   Ibuprofen Other (See Comments) 09/02/2016   Metronidazole  07/16/2021   Nsaids  07/16/2021   Soybean-containing drug products Other (See Comments) 12/10/2010    Family History  Problem Relation Age of Onset   Arthritis Mother    Migraines Mother    Hypertension Mother    Cancer Maternal Grandmother        lung   Asthma Other        family history     Colon cancer Neg Hx     Social History   Socioeconomic History   Marital status: Legally Separated    Spouse name: Not on file   Number of children: 1   Years of education: 12   Highest education level: 12th grade  Occupational History   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  Tobacco Use   Smoking status: Former    Packs/day: 1.00    Years: 5.00    Total pack years: 5.00    Types: Cigarettes    Quit date: 09/11/2010    Years since quitting: 11.6   Smokeless tobacco: Never   Tobacco comments:    QUIT SMOKING X 3 YEARS AGO,was vaping but has since quit completely  Vaping Use   Vaping Use: Every day  Substance and Sexual Activity   Alcohol use: No   Drug use: No   Sexual activity: Not Currently    Partners: Male    Birth control/protection: Other-see comments, Injection    Comment: Depo  Other Topics Concern   Not on file  Social History Narrative   Pt is currently on medicaid. From [place] and came to [facility] in mid 2010   Is separated from Husband, lives with Mother right now    Social Determinants of Health   Financial Resource Strain: Medium Risk (06/22/2017)   Overall Financial Resource Strain (CARDIA)    Difficulty of Paying Living Expenses: Somewhat hard  Food Insecurity: Food Insecurity Present (08/30/2020)   Hunger Vital Sign    Worried About [place] in the Last Year: Sometimes true    Ran Out of Food in the Last Year: Sometimes true  Transportation Needs: No Transportation Needs (08/30/2020)   PRAPARE - Hydrologist (Medical): No    Lack of Transportation (Non-Medical): No  Physical Activity: Sufficiently Active (08/30/2020)   Exercise Vital Sign    Days of Exercise per Week: 6 days    Minutes of Exercise per Session: 80 min  Stress: No Stress Concern Present (08/12/2018)   [place]    Feeling of Stress :  Only a little  Social Connections: Moderately Isolated (08/30/2020)   Social Connection and Isolation Panel [NHANES]    Frequency of Communication with Friends and Family: More than three times a week    Frequency of Social Gatherings with Friends and Family: More than three times a week    Attends Religious Services: More than 4 times per year    Active Member of [employer] or Organizations: No    Attends Archivist Meetings: Never    Marital Status: Separated    Review of Systems: Gen: Denies fever, chills, cold or flulike symptoms, presyncope, syncope. CV: Denies chest pain, palpitations. Resp: Denies dyspnea, cough.  GI: See HPI Heme: See HPI  Physical Exam: There were no vitals taken for this visit. General:   Alert and oriented. No distress noted. Pleasant and cooperative.  Head:  Normocephalic and atraumatic. Eyes:  Conjuctiva clear without scleral icterus. Heart:  S1, S2 present without murmurs appreciated. Lungs:  Clear to auscultation bilaterally. No wheezes, rales, or rhonchi. No distress.  Abdomen:  +BS, soft, non-tender and non-distended. No rebound or guarding. No HSM or masses noted. Msk:  Symmetrical without gross deformities. Normal posture. Extremities:  Without edema. Neurologic:  Alert and  oriented x4 Psych:  Normal mood and affect.    Assessment:     Plan:  ***   Aliene Altes, PA-C [facility] 04/17/2022

## 2022-04-16 ENCOUNTER — Encounter: Payer: Self-pay | Admitting: *Deleted

## 2022-04-17 ENCOUNTER — Encounter: Payer: Self-pay | Admitting: Gastroenterology

## 2022-04-17 ENCOUNTER — Ambulatory Visit (INDEPENDENT_AMBULATORY_CARE_PROVIDER_SITE_OTHER): Payer: Medicare Other | Admitting: Gastroenterology

## 2022-04-17 VITALS — BP 133/90 | HR 105 | Temp 98.0°F | Ht 64.0 in | Wt 185.4 lb

## 2022-04-17 DIAGNOSIS — K581 Irritable bowel syndrome with constipation: Secondary | ICD-10-CM | POA: Diagnosis not present

## 2022-04-17 NOTE — Patient Instructions (Signed)
Start Linzess 290 mcg daily.  You should take this medication on empty stomach first thing in the morning at least 30 minutes before you eat anything. Linzess can cause diarrhea initially, but this should taper off after 1-2 weeks. We are providing you with samples today.  Please call with a progress report in about 1 week to let me know if Linzess is working well.  If so, I will send in a prescription for you.  We will plan to follow-up with you in about 8 weeks or sooner if needed.  Aliene Altes, PA-C [facility]

## 2022-05-05 ENCOUNTER — Ambulatory Visit: Payer: Medicare Other | Admitting: Gastroenterology

## 2022-05-12 ENCOUNTER — Encounter: Payer: Self-pay | Admitting: Family Medicine

## 2022-05-12 ENCOUNTER — Ambulatory Visit (INDEPENDENT_AMBULATORY_CARE_PROVIDER_SITE_OTHER): Payer: Medicare Other | Admitting: Family Medicine

## 2022-05-12 VITALS — BP 136/84 | HR 112 | Ht 63.0 in | Wt 188.1 lb

## 2022-05-12 DIAGNOSIS — B839 Helminthiasis, unspecified: Secondary | ICD-10-CM | POA: Diagnosis not present

## 2022-05-12 MED ORDER — ALBENDAZOLE 200 MG PO TABS: 400.0000 mg | ORAL_TABLET | Freq: Once | ORAL | 0 refills | Status: AC

## 2022-05-12 NOTE — Patient Instructions (Addendum)
I appreciate the opportunity to provide care to you today!    Follow up:  Dr. Moshe Cipro  Please take Albendazole (adults and children: 400 mg orally once on empty stomach for worm infestation   Please continue to a heart-healthy diet and increase your physical activities. Try to exercise for 57mns at least three times a week.      It was a pleasure to see you and I look forward to continuing to work together on your health and well-being. Please do not hesitate to call the office if you need care or have questions about your care.   Have a wonderful day and week. With Gratitude, GAlvira MondayMSN, FNP-BC

## 2022-05-12 NOTE — Progress Notes (Signed)
   Acute Office Visit  Subjective:     Patient ID: Martha Montgomery, female    DOB: 07-14-1981, 41 y.o.   MRN: [id number]  Chief Complaint  Patient presents with   Stool Color Change    Pt reports stool changes, has pictures from today where it shows possible parasites/worms.     HPI Patient is in today with complaints of having worms in her stool.  The onset of symptoms was 05/12/2022.  She denies abdominal pain, bloating, foul-smelling and fatty stools, vomiting, and diarrhea.  She was seen by GI on April 17, 2022 complaining of constipation and intermittent passage of mucus with her stool.  She noted that the color of her mucus was whitish, clear, or green.  No mucus was visualized today with her stool; however, she did notice numerous warms in her stool.  Review of Systems  Constitutional:  Negative for fever and weight loss.  Eyes:  Negative for blurred vision.  Cardiovascular:  Negative for chest pain.  Gastrointestinal:  Negative for abdominal pain, blood in stool, diarrhea, nausea and vomiting.  Genitourinary:  Negative for flank pain.        Objective:    BP 136/84   Pulse (!) 112   Ht '5\' 3"'$  (1.6 m)   Wt 188 lb 1.3 oz (85.3 kg)   SpO2 96%   BMI 33.32 kg/m    Physical Exam HENT:     Head: Normocephalic.  Cardiovascular:     Rate and Rhythm: Normal rate and regular rhythm.     Pulses: Normal pulses.     Heart sounds: Normal heart sounds.  Pulmonary:     Effort: Pulmonary effort is normal.     Breath sounds: Normal breath sounds.  Neurological:     Mental Status: She is alert.     No results found for any visits on 05/12/22.      Assessment & Plan:   Problem List Items Addressed This Visit       Other   Worm infestation - Primary    The patient shared a picture of her stool that she took today on her phone I visualized multiple worms in her stool Will get a stool sample for an ova and parasite examination Will treat patient with albendazole 400  mg once daily on empty stomach       Relevant Medications   albendazole (ALBENZA) 200 MG tablet   Other Relevant Orders   Ova and parasite examination    Meds ordered this encounter  Medications   albendazole (ALBENZA) 200 MG tablet    Sig: Take 2 tablets (400 mg total) by mouth once for 1 dose.    Dispense:  1 tablet    Refill:  0    Return if symptoms worsen or fail to improve.  Alvira Monday, FNP

## 2022-05-12 NOTE — Assessment & Plan Note (Signed)
The patient shared a picture of her stool that she took today on her phone I visualized multiple worms in her stool Will get a stool sample for an ova and parasite examination Will treat patient with albendazole 400 mg once daily on empty stomach

## 2022-05-13 ENCOUNTER — Telehealth: Payer: Self-pay

## 2022-05-13 NOTE — Telephone Encounter (Signed)
Spoke to pharmacy, corrected script to 2 tablets, called pt to let her know.

## 2022-05-13 NOTE — Telephone Encounter (Signed)
Patient called said the pharmacy will not fill the medication that was sent yesterday needs more details on Albendazole  Pharmacy: [facility]  Patient is at the pharmacy waiting.

## 2022-06-05 ENCOUNTER — Encounter: Payer: Self-pay | Admitting: Family Medicine

## 2022-06-05 ENCOUNTER — Ambulatory Visit (INDEPENDENT_AMBULATORY_CARE_PROVIDER_SITE_OTHER): Payer: Medicare Other | Admitting: Family Medicine

## 2022-06-05 VITALS — BP 136/88 | HR 95 | Ht 63.0 in | Wt 194.0 lb

## 2022-06-05 DIAGNOSIS — I10 Essential (primary) hypertension: Secondary | ICD-10-CM | POA: Diagnosis not present

## 2022-06-05 DIAGNOSIS — K029 Dental caries, unspecified: Secondary | ICD-10-CM | POA: Diagnosis not present

## 2022-06-05 DIAGNOSIS — E7849 Other hyperlipidemia: Secondary | ICD-10-CM

## 2022-06-05 DIAGNOSIS — E890 Postprocedural hypothyroidism: Secondary | ICD-10-CM | POA: Diagnosis not present

## 2022-06-05 DIAGNOSIS — E669 Obesity, unspecified: Secondary | ICD-10-CM | POA: Diagnosis not present

## 2022-06-05 MED ORDER — CARVEDILOL 3.125 MG PO TABS: 3.1250 mg | ORAL_TABLET | Freq: Two times a day (BID) | ORAL | 3 refills | Status: DC

## 2022-06-05 NOTE — Patient Instructions (Addendum)
Annual exam with pap 01/07 or after. Re eval blood pressure  Fasting labs tomorrow, lipid, cmp and eGFr, hBA1C, TSH  You are referred to Dr Dorris Fetch re thyroid  New additional med for blood pressure is coreg one twice daily  It is important that you exercise regularly at least 30 minutes 5 times a week. If you develop chest pain, have severe difficulty breathing, or feel very tired, stop exercising immediately and seek medical attention   Think about what you will eat, plan ahead. Choose " clean, green, fresh or frozen" over canned, processed or packaged foods which are more sugary, salty and fatty. 70 to 75% of food eaten should be vegetables and fruit. Three meals at set times with snacks allowed between meals, but they must be fruit or vegetables. Aim to eat over a 12 hour period , example 7 am to 7 pm, and STOP after  your last meal of the day. Drink water,generally about 64 ounces per day, no other drink is as healthy. Fruit juice is best enjoyed in a healthy way, by EATING the fruit. Marland Kitchen

## 2022-06-06 DIAGNOSIS — I10 Essential (primary) hypertension: Secondary | ICD-10-CM | POA: Diagnosis not present

## 2022-06-07 LAB — CMP14+EGFR
ALT: 15 IU/L (ref 0–32)
AST: 19 IU/L (ref 0–40)
Albumin/Globulin Ratio: 1.7 (ref 1.2–2.2)
Albumin: 4.4 g/dL (ref 3.9–4.9)
Alkaline Phosphatase: 54 IU/L (ref 44–121)
BUN/Creatinine Ratio: 9 (ref 9–23)
BUN: 8 mg/dL (ref 6–24)
Bilirubin Total: 0.2 mg/dL (ref 0.0–1.2)
CO2: 22 mmol/L (ref 20–29)
Calcium: 9.5 mg/dL (ref 8.7–10.2)
Chloride: 101 mmol/L (ref 96–106)
Creatinine, Ser: 0.92 mg/dL (ref 0.57–1.00)
Globulin, Total: 2.6 g/dL (ref 1.5–4.5)
Glucose: 87 mg/dL (ref 70–99)
Potassium: 4.4 mmol/L (ref 3.5–5.2)
Sodium: 137 mmol/L (ref 134–144)
Total Protein: 7 g/dL (ref 6.0–8.5)
eGFR: 81 mL/min/{1.73_m2} (ref >59)

## 2022-06-07 LAB — TSH: TSH: 6.98 u[IU]/mL — ABNORMAL HIGH (ref 0.450–4.500)

## 2022-06-07 LAB — LIPID PANEL
Chol/HDL Ratio: 3.2 ratio (ref 0.0–4.4)
Cholesterol, Total: 228 mg/dL — ABNORMAL HIGH (ref 100–199)
HDL: 72 mg/dL (ref >39)
LDL Chol Calc (NIH): 146 mg/dL — ABNORMAL HIGH (ref 0–99)
Triglycerides: 56 mg/dL (ref 0–149)
VLDL Cholesterol Cal: 10 mg/dL (ref 5–40)

## 2022-06-07 LAB — HEMOGLOBIN A1C
Est. average glucose Bld gHb Est-mCnc: 108 mg/dL
Hgb A1c MFr Bld: 5.4 % (ref 4.8–5.6)

## 2022-06-08 ENCOUNTER — Encounter: Payer: Self-pay | Admitting: Family Medicine

## 2022-06-08 DIAGNOSIS — E785 Hyperlipidemia, unspecified: Secondary | ICD-10-CM | POA: Insufficient documentation

## 2022-06-08 NOTE — Assessment & Plan Note (Signed)
Plans to work on this

## 2022-06-08 NOTE — Assessment & Plan Note (Signed)
Uncontrolled add coreg DASH diet and commitment to daily physical activity for a minimum of 30 minutes discussed and encouraged, as a part of hypertension management. The importance of attaining a healthy weight is also discussed.     06/05/2022    4:49 PM 06/05/2022    4:20 PM 05/12/2022    4:03 PM 04/17/2022    1:13 PM 03/13/2022    4:27 PM 01/12/2022    6:03 AM 01/12/2022    2:45 AM  BP/Weight  Systolic BP 381 017 510 258 527 782 423  Diastolic BP 88 87 84 90 86 68 90  Wt. (Lbs)  194 188.08 185.4 185    BMI  34.37 kg/m2 33.32 kg/m2 31.82 kg/m2 32.77 kg/m2

## 2022-06-08 NOTE — Assessment & Plan Note (Signed)
Uncontrolled with weight gain , rept lab and pt to re establish with local Provider

## 2022-06-08 NOTE — Assessment & Plan Note (Signed)
  Patient re-educated about  the importance of commitment to a  minimum of 150 minutes of exercise per week as able.  The importance of healthy food choices with portion control discussed, as well as eating regularly and within a 12 hour window most days. The need to choose "clean , green" food 50 to 75% of the time is discussed, as well as to make water the primary drink and set a goal of 64 ounces water daily.       06/05/2022    4:20 PM 05/12/2022    4:03 PM 04/17/2022    1:13 PM  Weight /BMI  Weight 194 lb 188 lb 1.3 oz 185 lb 6.4 oz  Height '5\' 3"'$  (1.6 m) '5\' 3"'$  (1.6 m) '5\' 4"'$  (1.626 m)  BMI 34.37 kg/m2 33.32 kg/m2 31.82 kg/m2

## 2022-06-08 NOTE — Progress Notes (Signed)
Martha Montgomery     MRN: [id number]      DOB: 22-Jan-1981   HPI Martha Montgomery is here for follow up and re-evaluation of chronic medical conditions, medication management and review of any available recent lab and radiology data.  Preventive health is updated, specifically  Cancer screening and Immunization.   Questions or concerns regarding consultations or procedures which the PT has had in the interim are  addressed. The PT denies any adverse reactions to current medications since the last visit.  There are no new concerns.  There are no specific complaints   ROS Denies recent fever or chills. Denies sinus pressure, nasal congestion, ear pain or sore throat. Denies chest congestion, productive cough or wheezing. Denies chest pains, palpitations and leg swelling Denies abdominal pain, nausea, vomiting,diarrhea or constipation.   Denies dysuria, frequency, hesitancy or incontinence. Denies joint pain, swelling and limitation in mobility. Denies headaches, seizures, numbness, or tingling. Denies depression, anxiety or insomnia. Denies skin break down or rash.   PE  BP 136/88   Pulse 95   Ht '5\' 3"'$  (1.6 m)   Wt 194 lb (88 kg)   SpO2 98%   BMI 34.37 kg/m   Patient alert and oriented and in no cardiopulmonary distress.  HEENT: No facial asymmetry, EOMI,     Neck supple .  Chest: Clear to auscultation bilaterally.  CVS: S1, S2 no murmurs, no S3.Regular rate.  ABD: Soft non tender.   Ext: No edema  MS: Adequate ROM spine, shoulders, hips and knees.  Skin: Intact, no ulcerations or rash noted.  Psych: Good eye contact, normal affect. Memory intact not anxious or depressed appearing.  CNS: CN 2-12 intact, power,  normal throughout.no focal deficits noted.   Assessment & Plan  Hypertension Uncontrolled add coreg DASH diet and commitment to daily physical activity for a minimum of 30 minutes discussed and encouraged, as a part of hypertension management. The importance  of attaining a healthy weight is also discussed.     06/05/2022    4:49 PM 06/05/2022    4:20 PM 05/12/2022    4:03 PM 04/17/2022    1:13 PM 03/13/2022    4:27 PM 01/12/2022    6:03 AM 01/12/2022    2:45 AM  BP/Weight  Systolic BP 924 268 341 [phone]  Diastolic BP 88 87 84 90 86 68 90  Wt. (Lbs)  194 188.08 185.4 185    BMI  34.37 kg/m2 33.32 kg/m2 31.82 kg/m2 32.77 kg/m2         Post-surgical hypothyroidism Uncontrolled with weight gain , rept lab and pt to re establish with local Provider  Dental caries Plans to work on this  Obesity (BMI 30.0-34.9)  Patient re-educated about  the importance of commitment to a  minimum of 150 minutes of exercise per week as able.  The importance of healthy food choices with portion control discussed, as well as eating regularly and within a 12 hour window most days. The need to choose "clean , green" food 50 to 75% of the time is discussed, as well as to make water the primary drink and set a goal of 64 ounces water daily.       06/05/2022    4:20 PM 05/12/2022    4:03 PM 04/17/2022    1:13 PM  Weight /BMI  Weight 194 lb 188 lb 1.3 oz 185 lb 6.4 oz  Height '5\' 3"'$  (1.6 m) '5\' 3"'$  (1.6 m) '5\' 4"'$  (1.626  m)  BMI 34.37 kg/m2 33.32 kg/m2 31.82 kg/m2      Hyperlipidemia Hyperlipidemia:Low fat diet discussed and encouraged.   Lipid Panel  Lab Results  Component Value Date   CHOL 228 (H) 06/06/2022   HDL 72 06/06/2022   LDLCALC 146 (H) 06/06/2022   TRIG 56 06/06/2022   CHOLHDL 3.2 06/06/2022

## 2022-06-08 NOTE — Assessment & Plan Note (Signed)
Hyperlipidemia:Low fat diet discussed and encouraged.   Lipid Panel  Lab Results  Component Value Date   CHOL 228 (H) 06/06/2022   HDL 72 06/06/2022   LDLCALC 146 (H) 06/06/2022   TRIG 56 06/06/2022   CHOLHDL 3.2 06/06/2022

## 2022-06-10 NOTE — Progress Notes (Unsigned)
GI Office Note    Referring Provider: Fayrene Helper, MD Primary Care Physician:  Fayrene Helper, MD Primary Gastroenterologist: Elon Alas. Abbey Chatters, DO  Date:  06/12/2022  ID:  Martha Montgomery, DOB [birth year], MRN [id number]   Chief Complaint   Chief Complaint  Patient presents with   Follow-up    Still have some constipation.      History of Present Illness  Martha Montgomery is a 41 y.o. female with a history of IBS-C, rectal bleeding in the setting of hemorrhoids, and GERD presenting today for follow up on constipation.   Last colonoscopy in March 2019 revealing significant looping of the colon, internal hemorrhoids, otherwise normal exam.  Recommended 10-year surveillance.  Last office visit 04/17/22. Struggling with constipation having [place] 1-2 stools and lower abdominal pain that improves after defecation. Chronic intermittent rectal bleeding related to hemorrhoids. Denied rectal pain or anal intercourse. Miralax helpful but stated was expensive. Previously tried [place] which did not work well for her.  Reported incomplete bowel movements and occasional mucus.  Advised to try Linzess 290 mcg daily and follow up in a week for a progress report.   Today: Just recently started on carvedilol. Thyroid levels have been out of whack after having thyroid removed. Has been on medication since 2013. Medication level has not changed recently despite her levels not being adequate. Is going to see endocrinology next month.   GERD - Fairly controlled on pantoprazole 40 mg daily. Has been consuming trigger products and having some issues. Definitely is worse with constipation.   Constipation - Hard to say whether or not the Linzess was helpful. Unable to afford it until the end of the month. Believes that miralax added may help as well. Having a BM once per day but not emptying all the way. When constipated her abdomen is distended and affects her ability to urinate. Does not drink  sodas and has been working on drinking more water. Admits to eating a lot of dairy. Has not taken lactaid before. Has probiotics but forgets to take them. Eating more fruits and vegetables. Reports a long history of digestive issues and constipation.   Goal to be able to treat skin conditions homeopathically and open up a full service spa.   Current Outpatient Medications  Medication Sig Dispense Refill   amLODipine (NORVASC) 10 MG tablet TAKE 1 TABLET(10 MG) BY MOUTH DAILY 90 tablet 3   carvedilol (COREG) 3.125 MG tablet Take 1 tablet (3.125 mg total) by mouth 2 (two) times daily with a meal. 60 tablet 3   diphenhydrAMINE (BENADRYL) 25 MG tablet Take 25 mg by mouth in the morning and at bedtime.     EPINEPHrine 0.3 mg/0.3 mL IJ SOAJ injection Inject 0.3 mg into the muscle as needed for anaphylaxis. 1 each 0   levothyroxine (SYNTHROID) 200 MCG tablet TAKE 1 AND 1/2 TABLETS BY MOUTH EVERY DAY ON AN EMPTY STOMACH. DO NOT EAT. LESS THAN 2 HOURS AFTER TAKING THE MEDICATION 135 tablet 0   medroxyPROGESTERone (DEPO-PROVERA) 150 MG/ML injection Inject 150 mg into the muscle every 3 (three) months.     morphine (MSIR) 15 MG tablet Take 15 mg by mouth 3 (three) times daily as needed.     pantoprazole (PROTONIX) 40 MG tablet TAKE 1 TABLET BY MOUTH TWICE DAILY BEFORE A MEAL 180 tablet 0   pregabalin (LYRICA) 150 MG capsule Take 150 mg by mouth 3 (three) times daily.     rosuvastatin (CRESTOR) 20 MG tablet  Take 1 tablet (20 mg total) by mouth daily. 30 tablet 5   traMADol (ULTRAM) 50 MG tablet Take by mouth every 12 (twelve) hours as needed.     triamterene-hydrochlorothiazide (MAXZIDE) 75-50 MG tablet TAKE 1 TABLET BY MOUTH DAILY 90 tablet 1   fluticasone (FLONASE) 50 MCG/ACT nasal spray Place 1 spray into both nostrils daily as needed for allergies or rhinitis. (Patient not taking: Reported on 06/12/2022)     Current Facility-Administered Medications  Medication Dose Route Frequency Provider Last Rate  Last Admin   medroxyPROGESTERone (DEPO-PROVERA) injection 150 mg  150 mg Intramuscular Once Fayrene Helper, MD        Past Medical History:  Diagnosis Date   Anxiety    Anxiety disorder    Arthritis    "knees, joints, hands, toes" (09/28/2013)   Depression    Depression, major, single episode, in partial remission ([place]) 10/18/2008   Qualifier: Diagnosis of  By: Moshe Cipro MD, Margaret  PHQ 9 score of 7 in 10/2017, not suicidal or homicidal, interested in telepsych however, score of 16 in  02/2018, not suicidal or homicidal, start medication and refer to telepsych Score of 7 in 07/2018    Fibromyalgia    Gastritis nov. 2011   EGD by Dr. Oneida Alar, negative h pylori   GERD (gastroesophageal reflux disease)    Gout    Hypertension x 6 years    Hypothyroidism    Migraines    "15/month average" (09/28/2013)   Obesity (BMI 30.0-34.9)    Spinal stenosis    Thyroid goiter     Past Surgical History:  Procedure Laterality Date   BIOPSY  10/20/2017   Procedure: BIOPSY;  Surgeon: Danie Binder, MD;  [facility]: [facility];  Service: Endoscopy;;  gastric duodenal esophagus   CESAREAN SECTION  2008   CHONDROPLASTY  04/02/2012   Procedure: CHONDROPLASTY;  Surgeon: Carole Civil, MD;  Location: [facility];  Service: Orthopedics;  Laterality: Left;  of left patella   COLONOSCOPY  11/2010   Dr. Oneida Alar: normal colon, small internal hemorrhoids   COLONOSCOPY WITH PROPOFOL N/A 10/22/2017   Surgeon: Danie Binder, MD; significant looping of the colon, internal hemorrhoids, otherwise normal exam.  Recommended 10-year surveillance.   COLONOSCOPY WITH PROPOFOL N/A 10/20/2017   Procedure: COLONOSCOPY WITH PROPOFOL;  Surgeon: Danie Binder, MD;  Location: [facility];  Service: Endoscopy;  Laterality: N/A;  11:00am   ESOPHAGOGASTRODUODENOSCOPY (EGD) WITH PROPOFOL N/A 10/20/2017   Surgeon: Danie Binder, MD; no endoscopic abnormality to explain dysphagia s/p empiric dilation, gastritis.   Esophageal biopsies consistent with reflux, gastric biopsies with gastric antral and oxyntic mucosa negative for H. pylori, duodenal biopsies benign.   INCISION AND DRAINAGE ABSCESS  1997; 2005   "under right buttocks; under left arm"   SAVORY DILATION N/A 10/20/2017   Procedure: SAVORY DILATION;  Surgeon: Danie Binder, MD;  Location: [facility];  Service: Endoscopy;  Laterality: N/A;   THYROIDECTOMY N/A 09/28/2013   Procedure: TOTAL THYROIDECTOMY;  Surgeon: Ascencion Dike, MD;  Location: [facility] OR;  Service: ENT;  Laterality: N/A;   TOTAL THYROIDECTOMY  09/27/2013   UPPER GI ENDOSCOPY  2011   EGD: gastritis, no h.pylori    Family History  Problem Relation Age of Onset   Arthritis Mother    Migraines Mother    Hypertension Mother    Cancer Maternal Grandmother        lung   Asthma Other  family history    Colon cancer Neg Hx     Allergies as of 06/12/2022 - Review Complete 06/12/2022  Allergen Reaction Noted   Aspirin  05/23/2020   Codeine Itching    Diclofenac  07/16/2021   Ibuprofen Other (See Comments) 09/02/2016   Metronidazole  07/16/2021   Nsaids  07/16/2021   Soybean-containing drug products Other (See Comments) 12/10/2010    Social History   Socioeconomic History   Marital status: Legally Separated    Spouse name: Not on file   Number of children: 1   Years of education: 12   Highest education level: 12th grade  Occupational History   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  Tobacco Use   Smoking status: Former    Packs/day: 1.00    Years: 5.00    Total pack years: 5.00    Types: Cigarettes    Quit date: 09/11/2010    Years since quitting: 11.7   Smokeless tobacco: Never   Tobacco comments:    QUIT SMOKING X 3 YEARS AGO,was vaping but has since quit completely  Vaping Use   Vaping Use: Every day  Substance and Sexual Activity   Alcohol use: No   Drug use: No   Sexual activity: Not Currently     Partners: Male    Birth control/protection: Other-see comments, Injection    Comment: Depo  Other Topics Concern   Not on file  Social History Narrative   Pt is currently on medicaid. From [place] and came to [facility] in mid 2010   Is separated from Husband, lives with Mother right now    Social Determinants of Health   Financial Resource Strain: Medium Risk (06/22/2017)   Overall Financial Resource Strain (CARDIA)    Difficulty of Paying Living Expenses: Somewhat hard  Food Insecurity: Food Insecurity Present (08/30/2020)   Hunger Vital Sign    Worried About [place] in the Last Year: Sometimes true    Ran Out of Food in the Last Year: Sometimes true  Transportation Needs: No Transportation Needs (08/30/2020)   PRAPARE - Hydrologist (Medical): No    Lack of Transportation (Non-Medical): No  Physical Activity: Sufficiently Active (08/30/2020)   Exercise Vital Sign    Days of Exercise per Week: 6 days    Minutes of Exercise per Session: 80 min  Stress: No Stress Concern Present (08/12/2018)   [place]    Feeling of Stress : Only a little  Social Connections: Moderately Isolated (08/30/2020)   Social Connection and Isolation Panel [NHANES]    Frequency of Communication with Friends and Family: More than three times a week    Frequency of Social Gatherings with Friends and Family: More than three times a week    Attends Religious Services: More than 4 times per year    Active Member of [employer] or Organizations: No    Attends Archivist Meetings: Never    Marital Status: Separated     Review of Systems   Gen: Denies fever, chills, anorexia. Denies fatigue, weakness, weight loss.  CV: Denies chest pain, palpitations, syncope, peripheral edema, and claudication. Resp: Denies dyspnea at rest, cough, wheezing, coughing up blood, and pleurisy. GI: See HPI Derm: Denies rash, itching,  dry skin Psych: Denies depression, anxiety, memory loss, confusion. No homicidal or suicidal ideation.  Heme: Denies bruising, bleeding, and enlarged lymph nodes.   Physical  Exam   BP (!) 143/91 (BP Location: Left Arm, Patient Position: Sitting, Cuff Size: Normal)   Pulse (!) 102   Temp 97.7 F (36.5 C) (Temporal)   Ht '5\' 3"'$  (1.6 m)   Wt 195 lb (88.5 kg)   SpO2 97%   BMI 34.54 kg/m   General:   Alert and oriented. No distress noted. Pleasant and cooperative.  Head:  Normocephalic and atraumatic. Eyes:  Conjuctiva clear without scleral icterus. Mouth:  Oral mucosa pink and moist. Good dentition. No lesions. Lungs:  Clear to auscultation bilaterally. No wheezes, rales, or rhonchi. No distress.  Heart:  S1, S2 present without murmurs appreciated.  Abdomen:  +BS, soft, non-tender and non-distended. Mild tightness to upper abdomen without overt tenderness. No rebound or guarding. No HSM or masses noted. Rectal: deferred Msk:  Symmetrical without gross deformities. Normal posture. Extremities:  Without edema. Neurologic:  Alert and  oriented x4 Psych:  Alert and cooperative. Normal mood and affect.   Assessment  Martha Montgomery is a 41 y.o. female with a history of  IBS-C, rectal bleeding in the setting of hemorrhoids, and GERD presenting today for follow up on constipation.   IBS-C: Chronic.  Given samples of Linzess 290 mcg at her last visit and has had some improvement when taking it but did not follow-up to ask for prescription given her ability to afford medication.  She would like prescription today to be able to pick up by the end of the month.  She has been working on dietary changes and increasing intake of fruits and vegetables.  Has been forgetting to take probiotics.  She would like to consider taking MiraLAX 1 capful daily along with her Linzess in order to improve her bowel habits.  Currently without any rectal pain, melena, or BRBPR.  Abdominal pain usually does improve  after bowel movements.  Currently having a very small bowel movement almost daily and occasionally with mucus but does report incomplete emptying.  Prescription of Linzess sent in and adding MiraLAX would be a good regimen.  May potentially benefit from [place] if this is not helpful given she has tried and failed Amitiza.  GERD: Fairly well controlled on pantoprazole 40 mg daily.  Has occasional symptoms if she eats her typical trigger foods.  Hypertension: The patient was found to have elevated blood pressure when vital signs were checked in the office. The blood pressure was rechecked by the nursing staff and it was found be persistently elevated >140/90 mmHg. I personally advised to the patient to continue to follow PCP recommendations for BP control. Recently started on carvedilol.   PLAN   Linzess 290 mcg daily, 30 minutes prior to breakfast. Continue pantoprazole 40 mg daily.  GERD diet Miralax 1 capful daily.  Follow a high fiber diet.  May use Lactaid prior to consuming dairy products, avoidance if possible.  May use Phazyme as needed for excess gas.  Continue to follow up with PCP for BP management.  Follow up in 3-4 months.    Venetia Night, MSN, FNP-BC, AGACNP-BC [facility]

## 2022-06-12 ENCOUNTER — Encounter: Payer: Self-pay | Admitting: Gastroenterology

## 2022-06-12 ENCOUNTER — Ambulatory Visit (INDEPENDENT_AMBULATORY_CARE_PROVIDER_SITE_OTHER): Payer: Medicare Other | Admitting: Gastroenterology

## 2022-06-12 VITALS — BP 143/91 | HR 102 | Temp 97.7°F | Ht 63.0 in | Wt 195.0 lb

## 2022-06-12 DIAGNOSIS — K581 Irritable bowel syndrome with constipation: Secondary | ICD-10-CM | POA: Diagnosis not present

## 2022-06-12 DIAGNOSIS — K219 Gastro-esophageal reflux disease without esophagitis: Secondary | ICD-10-CM | POA: Diagnosis not present

## 2022-06-12 DIAGNOSIS — I10 Essential (primary) hypertension: Secondary | ICD-10-CM

## 2022-06-12 MED ORDER — LINACLOTIDE 290 MCG PO CAPS: 290.0000 ug | ORAL_CAPSULE | Freq: Every day | ORAL | 3 refills | Status: DC

## 2022-06-12 NOTE — Patient Instructions (Addendum)
Continue miralax 17g daily. Follow a high fiber diet. Continue Linzess 290 mcg daily, I have sent prescription to your pharmacy. Continue probiotics if you feel like these are helpful.   How to take Linzess: Once a day every day on empty stomach, at least 30 minutes before your first meal of the day. It is best to keep medications at a stable temperature Medication is best kept in its original bottle with the disket present.  It is a medication that is meant for everyday use and not to be used as needed.   What to expect: Constipation relief is typically felt in about 1 week Relief of abdominal pain, discomfort, and bloating begins in about 1 week with symptoms typically improving over 12 weeks and beyond. Diarrhea is most common side effect and typically begins within the first 2 weeks and can take 3-4 weeks to resolve It would be helpful to begin treatment over the weekend or when you can be closer to a bathroom   You can go to [email or web address] for patient support and sign up for daily medication reminders.  May use over the counter Phazyme to help with excess gas or may try lactaid prior to consuming dairy products. As we discussed, avoiding dairy products as much as possible would likely improve your symptoms. Dairy products are also a known inflammatory food group.   Continue pantoprazole 40 mg daily. Follow a GERD diet:  Avoid fried, fatty, greasy, spicy, citrus foods. Avoid caffeine and carbonated beverages. Avoid chocolate. Try eating 4-6 small meals a day rather than 3 large meals. Do not eat within 3 hours of laying down. Prop head of bed up on wood or bricks to create a 6 inch incline.   It was a pleasure to see you today. I want to create trusting relationships with patients. If you receive a survey regarding your visit,  I greatly appreciate you taking time to fill this out on paper or through your MyChart. I value your feedback.  Venetia Night, MSN, FNP-BC,  AGACNP-BC [facility]

## 2022-07-02 ENCOUNTER — Telehealth: Payer: Self-pay

## 2022-07-02 NOTE — Telephone Encounter (Signed)
Pt phoned advising that she has worms again. She stated she had a BM today and seen it. She states she has pictures. Wants to speak with you. Please advise

## 2022-07-02 NOTE — Telephone Encounter (Signed)
Phoned the pt and advised her to MyChart you and send the pictures along. Pt expressed understanding and agreed

## 2022-07-02 NOTE — Telephone Encounter (Signed)
noted 

## 2022-07-03 DIAGNOSIS — M545 Low back pain, unspecified: Secondary | ICD-10-CM | POA: Diagnosis not present

## 2022-07-03 DIAGNOSIS — I10 Essential (primary) hypertension: Secondary | ICD-10-CM | POA: Diagnosis not present

## 2022-07-03 DIAGNOSIS — M25569 Pain in unspecified knee: Secondary | ICD-10-CM | POA: Diagnosis not present

## 2022-07-03 DIAGNOSIS — Z79891 Long term (current) use of opiate analgesic: Secondary | ICD-10-CM | POA: Diagnosis not present

## 2022-07-03 DIAGNOSIS — M130 Polyarthritis, unspecified: Secondary | ICD-10-CM | POA: Diagnosis not present

## 2022-07-03 DIAGNOSIS — Z79899 Other long term (current) drug therapy: Secondary | ICD-10-CM | POA: Diagnosis not present

## 2022-07-07 ENCOUNTER — Other Ambulatory Visit: Payer: Self-pay | Admitting: Gastroenterology

## 2022-07-07 DIAGNOSIS — Z8619 Personal history of other infectious and parasitic diseases: Secondary | ICD-10-CM

## 2022-07-16 ENCOUNTER — Ambulatory Visit: Payer: Medicare Other | Admitting: Nurse Practitioner

## 2022-08-04 DIAGNOSIS — E559 Vitamin D deficiency, unspecified: Secondary | ICD-10-CM | POA: Diagnosis not present

## 2022-08-04 DIAGNOSIS — Z79899 Other long term (current) drug therapy: Secondary | ICD-10-CM | POA: Diagnosis not present

## 2022-08-04 DIAGNOSIS — M130 Polyarthritis, unspecified: Secondary | ICD-10-CM | POA: Diagnosis not present

## 2022-08-04 DIAGNOSIS — Z0130 Encounter for examination of blood pressure without abnormal findings: Secondary | ICD-10-CM | POA: Diagnosis not present

## 2022-08-04 DIAGNOSIS — M542 Cervicalgia: Secondary | ICD-10-CM | POA: Diagnosis not present

## 2022-08-04 DIAGNOSIS — M797 Fibromyalgia: Secondary | ICD-10-CM | POA: Diagnosis not present

## 2022-08-04 DIAGNOSIS — M549 Dorsalgia, unspecified: Secondary | ICD-10-CM | POA: Diagnosis not present

## 2022-08-04 DIAGNOSIS — I10 Essential (primary) hypertension: Secondary | ICD-10-CM | POA: Diagnosis not present

## 2022-08-04 DIAGNOSIS — M1712 Unilateral primary osteoarthritis, left knee: Secondary | ICD-10-CM | POA: Diagnosis not present

## 2022-08-04 DIAGNOSIS — M25562 Pain in left knee: Secondary | ICD-10-CM | POA: Diagnosis not present

## 2022-08-07 ENCOUNTER — Encounter: Payer: Self-pay | Admitting: Family Medicine

## 2022-08-07 ENCOUNTER — Other Ambulatory Visit: Payer: Self-pay

## 2022-08-07 ENCOUNTER — Other Ambulatory Visit (HOSPITAL_COMMUNITY)
Admission: RE | Admit: 2022-08-07 | Discharge: 2022-08-07 | Disposition: A | Discharge status: Home / Self Care | Payer: Medicare Other | Source: Ambulatory Visit | Attending: Family Medicine | Admitting: Family Medicine

## 2022-08-07 ENCOUNTER — Ambulatory Visit (INDEPENDENT_AMBULATORY_CARE_PROVIDER_SITE_OTHER): Payer: Medicare Other | Admitting: Family Medicine

## 2022-08-07 VITALS — BP 116/78 | HR 102 | Ht 63.0 in | Wt 199.1 lb

## 2022-08-07 DIAGNOSIS — Z3042 Encounter for surveillance of injectable contraceptive: Secondary | ICD-10-CM

## 2022-08-07 DIAGNOSIS — Z113 Encounter for screening for infections with a predominantly sexual mode of transmission: Secondary | ICD-10-CM

## 2022-08-07 DIAGNOSIS — Z1151 Encounter for screening for human papillomavirus (HPV): Secondary | ICD-10-CM | POA: Insufficient documentation

## 2022-08-07 DIAGNOSIS — Z01419 Encounter for gynecological examination (general) (routine) without abnormal findings: Secondary | ICD-10-CM | POA: Insufficient documentation

## 2022-08-07 DIAGNOSIS — Z0001 Encounter for general adult medical examination with abnormal findings: Secondary | ICD-10-CM | POA: Insufficient documentation

## 2022-08-07 DIAGNOSIS — Z1239 Encounter for other screening for malignant neoplasm of breast: Secondary | ICD-10-CM

## 2022-08-07 DIAGNOSIS — Z124 Encounter for screening for malignant neoplasm of cervix: Secondary | ICD-10-CM

## 2022-08-07 DIAGNOSIS — Z3040 Encounter for surveillance of contraceptives, unspecified: Secondary | ICD-10-CM

## 2022-08-07 MED ORDER — MEDROXYPROGESTERONE ACETATE 150 MG/ML IM SUSP
150.0000 mg | Freq: Once | INTRAMUSCULAR | Status: AC
  Administered 2022-08-07: 150 mg via INTRAMUSCULAR

## 2022-08-07 NOTE — Patient Instructions (Signed)
F/U in 12 weeks, re evaluate weight and BPcall if you need me sooner   Depo today  Nurse visit for Depo provera in 12 weeks with MD follow up   10 pound weight loss goal Pap and specimen sent for STI testing  BP is excellent  Please sched mammogram at checkout   It is important that you exercise regularly at least 30 minutes 5 times a week. If you develop chest pain, have severe difficulty breathing, or feel very tired, stop exercising immediately and seek medical attention  Think about what you will eat, plan ahead. Choose " clean, green, fresh or frozen" over canned, processed or packaged foods which are more sugary, salty and fatty. 70 to 75% of food eaten should be vegetables and fruit. Three meals at set times with snacks allowed between meals, but they must be fruit or vegetables. Aim to eat over a 12 hour period , example 7 am to 7 pm, and STOP after  your last meal of the day. Drink water,generally about 64 ounces per day, no other drink is as healthy. Fruit juice is best enjoyed in a healthy way, by EATING the fruit.

## 2022-08-07 NOTE — Progress Notes (Signed)
Martha Montgomery     MRN: [id number]      DOB: March 30, 1981  HPI: Patient is in for annual physical exam. No other health concerns are expressed or addressed at the visit. Recent labs,  are reviewed. Immunization is reviewed , and  updated if needed.   PE: BP 116/78 (BP Location: Right Arm, Patient Position: Sitting, Cuff Size: Large)   Pulse (!) 102   Ht '5\' 3"'$  (1.6 m)   Wt 199 lb 1 oz (90.3 kg)   SpO2 96%   BMI 35.26 kg/m   Pleasant  female, alert and oriented x 3, in no cardio-pulmonary distress. Afebrile. HEENT No facial trauma or asymetry. Sinuses non tender.  Extra occullar muscles intact.. External ears normal, . Neck: supple, no adenopathy,JVD or thyromegaly.No bruits.  Chest: Clear to ascultation bilaterally.No crackles or wheezes. Non tender to palpation  Breast: No asymetry,no masses or lumps. No tenderness. No nipple discharge or inversion. No axillary or supraclavicular adenopathy  Cardiovascular system; Heart sounds normal,  S1 and  S2 ,no S3.  No murmur, or thrill. Apical beat not displaced Peripheral pulses normal.  Abdomen: Soft, non tender, no organomegaly or masses. No bruits. Bowel sounds normal. No guarding, tenderness or rebound.   GU: External genitalia normal female genitalia , normal female distribution of hair. No lesions. Urethral meatus normal in size, no  Prolapse, no lesions visibly  Present. Bladder non tender. Vagina pink and moist , with no visible lesions , discharge present . Adequate pelvic support no  cystocele or rectocele noted Cervix pink and appears healthy, no lesions or ulcerations noted, no discharge noted from os Uterus normal size, no adnexal masses, no cervical motion or adnexal tenderness.   Musculoskeletal exam: Full ROM of spine, hips , shoulders and knees. No deformity ,swelling or crepitus noted. No muscle wasting or atrophy.   Neurologic: Cranial nerves 2 to 12 intact. Power, tone ,sensation and reflexes  normal throughout. No disturbance in gait. No tremor.  Skin: Intact, no ulceration, erythema , scaling or rash noted. Pigmentation normal throughout  Psych; Normal mood and affect. Judgement and concentration normal   Assessment & Plan:  Encounter for Medicare annual examination with abnormal findings Annual exam as documented. Counseling done  re healthy lifestyle involving commitment to 150 minutes exercise per week, heart healthy diet, and attaining healthy weight.The importance of adequate sleep also discussed. Regular seat belt use and home safety, is also discussed. Changes in health habits are decided on by the patient with goals and time frames  set for achieving them. Immunization and cancer screening needs are specifically addressed at this visit.   Morbid obesity due to excess calories [facility])  Patient re-educated about  the importance of commitment to a  minimum of 150 minutes of exercise per week as able.  The importance of healthy food choices with portion control discussed, as well as eating regularly and within a 12 hour window most days. The need to choose "clean , green" food 50 to 75% of the time is discussed, as well as to make water the primary drink and set a goal of 64 ounces water daily.       08/07/2022    4:13 PM 06/12/2022    2:10 PM 06/05/2022    4:20 PM  Weight /BMI  Weight 199 lb 1 oz 195 lb 194 lb  Height '5\' 3"'$  (1.6 m) '5\' 3"'$  (1.6 m) '5\' 3"'$  (1.6 m)  BMI 35.26 kg/m2 34.54 kg/m2 34.37 kg/m2  Encounter for contraceptive management Depo provera 150 mg IM administered

## 2022-08-07 NOTE — Assessment & Plan Note (Signed)

## 2022-08-07 NOTE — Assessment & Plan Note (Signed)
Depo provera 150 mg IM administered

## 2022-08-07 NOTE — Assessment & Plan Note (Signed)
  Patient re-educated about  the importance of commitment to a  minimum of 150 minutes of exercise per week as able.  The importance of healthy food choices with portion control discussed, as well as eating regularly and within a 12 hour window most days. The need to choose "clean , green" food 50 to 75% of the time is discussed, as well as to make water the primary drink and set a goal of 64 ounces water daily.       08/07/2022    4:13 PM 06/12/2022    2:10 PM 06/05/2022    4:20 PM  Weight /BMI  Weight 199 lb 1 oz 195 lb 194 lb  Height '5\' 3"'$  (1.6 m) '5\' 3"'$  (1.6 m) '5\' 3"'$  (1.6 m)  BMI 35.26 kg/m2 34.54 kg/m2 34.37 kg/m2

## 2022-08-10 LAB — CERVICOVAGINAL ANCILLARY ONLY
Bacterial Vaginitis (gardnerella): NEGATIVE
Candida Glabrata: NEGATIVE
Candida Vaginitis: POSITIVE — AB
Chlamydia: NEGATIVE
Comment: NEGATIVE
Comment: NEGATIVE
Comment: NEGATIVE
Comment: NEGATIVE
Comment: NEGATIVE
Comment: NORMAL
Neisseria Gonorrhea: NEGATIVE
Trichomonas: NEGATIVE

## 2022-08-10 MED ORDER — FLUCONAZOLE 150 MG PO TABS: 150.0000 mg | ORAL_TABLET | Freq: Once | ORAL | 0 refills | Status: AC

## 2022-08-10 NOTE — Addendum Note (Signed)
Addended by: Fayrene Helper on: 08/10/2022 09:25 PM   Modules accepted: Orders

## 2022-08-11 DIAGNOSIS — Z79899 Other long term (current) drug therapy: Secondary | ICD-10-CM | POA: Diagnosis not present

## 2022-08-12 LAB — CYTOLOGY - PAP
Comment: NEGATIVE
Diagnosis: NEGATIVE
High risk HPV: NEGATIVE

## 2022-08-18 DIAGNOSIS — M47812 Spondylosis without myelopathy or radiculopathy, cervical region: Secondary | ICD-10-CM | POA: Diagnosis not present

## 2022-08-18 DIAGNOSIS — M797 Fibromyalgia: Secondary | ICD-10-CM | POA: Diagnosis not present

## 2022-08-18 DIAGNOSIS — Z0130 Encounter for examination of blood pressure without abnormal findings: Secondary | ICD-10-CM | POA: Diagnosis not present

## 2022-08-18 DIAGNOSIS — I10 Essential (primary) hypertension: Secondary | ICD-10-CM | POA: Diagnosis not present

## 2022-08-18 DIAGNOSIS — M25562 Pain in left knee: Secondary | ICD-10-CM | POA: Diagnosis not present

## 2022-08-18 DIAGNOSIS — M1712 Unilateral primary osteoarthritis, left knee: Secondary | ICD-10-CM | POA: Diagnosis not present

## 2022-08-18 DIAGNOSIS — M542 Cervicalgia: Secondary | ICD-10-CM | POA: Diagnosis not present

## 2022-08-18 DIAGNOSIS — Z79899 Other long term (current) drug therapy: Secondary | ICD-10-CM | POA: Diagnosis not present

## 2022-08-21 DIAGNOSIS — Z79899 Other long term (current) drug therapy: Secondary | ICD-10-CM | POA: Diagnosis not present

## 2022-08-27 ENCOUNTER — Ambulatory Visit: Payer: Medicare Other | Admitting: Nurse Practitioner

## 2022-08-27 DIAGNOSIS — E890 Postprocedural hypothyroidism: Secondary | ICD-10-CM

## 2022-08-27 NOTE — Patient Instructions (Incomplete)

## 2022-08-28 ENCOUNTER — Encounter: Payer: Self-pay | Admitting: Gastroenterology

## 2022-09-17 DIAGNOSIS — Z79899 Other long term (current) drug therapy: Secondary | ICD-10-CM | POA: Diagnosis not present

## 2022-09-17 DIAGNOSIS — M47812 Spondylosis without myelopathy or radiculopathy, cervical region: Secondary | ICD-10-CM | POA: Diagnosis not present

## 2022-09-17 DIAGNOSIS — M25562 Pain in left knee: Secondary | ICD-10-CM | POA: Diagnosis not present

## 2022-09-17 DIAGNOSIS — M797 Fibromyalgia: Secondary | ICD-10-CM | POA: Diagnosis not present

## 2022-09-17 DIAGNOSIS — M542 Cervicalgia: Secondary | ICD-10-CM | POA: Diagnosis not present

## 2022-09-17 DIAGNOSIS — I10 Essential (primary) hypertension: Secondary | ICD-10-CM | POA: Diagnosis not present

## 2022-09-17 DIAGNOSIS — M1712 Unilateral primary osteoarthritis, left knee: Secondary | ICD-10-CM | POA: Diagnosis not present

## 2022-09-17 DIAGNOSIS — Z0130 Encounter for examination of blood pressure without abnormal findings: Secondary | ICD-10-CM | POA: Diagnosis not present

## 2022-09-25 ENCOUNTER — Encounter: Payer: Self-pay | Admitting: Radiology

## 2022-10-09 ENCOUNTER — Ambulatory Visit (HOSPITAL_COMMUNITY)
Admission: RE | Admit: 2022-10-09 | Discharge: 2022-10-09 | Disposition: A | Discharge status: Home / Self Care | Payer: Medicare Other | Source: Ambulatory Visit | Attending: Family Medicine | Admitting: Family Medicine

## 2022-10-09 DIAGNOSIS — Z1231 Encounter for screening mammogram for malignant neoplasm of breast: Secondary | ICD-10-CM | POA: Insufficient documentation

## 2022-10-09 DIAGNOSIS — Z1239 Encounter for other screening for malignant neoplasm of breast: Secondary | ICD-10-CM

## 2022-10-15 DIAGNOSIS — M25562 Pain in left knee: Secondary | ICD-10-CM | POA: Diagnosis not present

## 2022-10-15 DIAGNOSIS — R0602 Shortness of breath: Secondary | ICD-10-CM | POA: Diagnosis not present

## 2022-10-15 DIAGNOSIS — M47812 Spondylosis without myelopathy or radiculopathy, cervical region: Secondary | ICD-10-CM | POA: Diagnosis not present

## 2022-10-15 DIAGNOSIS — I10 Essential (primary) hypertension: Secondary | ICD-10-CM | POA: Diagnosis not present

## 2022-10-15 DIAGNOSIS — M797 Fibromyalgia: Secondary | ICD-10-CM | POA: Diagnosis not present

## 2022-10-15 DIAGNOSIS — Z0130 Encounter for examination of blood pressure without abnormal findings: Secondary | ICD-10-CM | POA: Diagnosis not present

## 2022-10-15 DIAGNOSIS — M1712 Unilateral primary osteoarthritis, left knee: Secondary | ICD-10-CM | POA: Diagnosis not present

## 2022-10-15 DIAGNOSIS — Z79899 Other long term (current) drug therapy: Secondary | ICD-10-CM | POA: Diagnosis not present

## 2022-10-15 DIAGNOSIS — M542 Cervicalgia: Secondary | ICD-10-CM | POA: Diagnosis not present

## 2022-10-22 DIAGNOSIS — Z79899 Other long term (current) drug therapy: Secondary | ICD-10-CM | POA: Diagnosis not present

## 2022-10-30 ENCOUNTER — Encounter: Payer: Self-pay | Admitting: Family Medicine

## 2022-10-30 ENCOUNTER — Ambulatory Visit (INDEPENDENT_AMBULATORY_CARE_PROVIDER_SITE_OTHER): Payer: Medicare Other | Admitting: Family Medicine

## 2022-10-30 VITALS — BP 124/84 | HR 98 | Ht 63.0 in | Wt 201.0 lb

## 2022-10-30 DIAGNOSIS — M2550 Pain in unspecified joint: Secondary | ICD-10-CM | POA: Diagnosis not present

## 2022-10-30 DIAGNOSIS — I10 Essential (primary) hypertension: Secondary | ICD-10-CM

## 2022-10-30 DIAGNOSIS — Z3042 Encounter for surveillance of injectable contraceptive: Secondary | ICD-10-CM

## 2022-10-30 DIAGNOSIS — E890 Postprocedural hypothyroidism: Secondary | ICD-10-CM

## 2022-10-30 DIAGNOSIS — Z3040 Encounter for surveillance of contraceptives, unspecified: Secondary | ICD-10-CM

## 2022-10-30 DIAGNOSIS — E7849 Other hyperlipidemia: Secondary | ICD-10-CM | POA: Diagnosis not present

## 2022-10-30 MED ORDER — MEDROXYPROGESTERONE ACETATE 150 MG/ML IM SUSP
150.0000 mg | Freq: Once | INTRAMUSCULAR | Status: AC
Start: 2022-10-30 — End: 2022-10-30
  Administered 2022-10-30: 150 mg via INTRAMUSCULAR

## 2022-10-30 NOTE — Patient Instructions (Signed)
F/UI in 12 weeks, call if you need me sooner  Fasting lipid, chem 7 and EGFr, and TSH next week  Depo today  It is important that you exercise regularly at least 30 minutes 5 times a week. If you develop chest pain, have severe difficulty breathing, or feel very tired, stop exercising immediately and seek medical attention   Think about what you will eat, plan ahead. Choose " clean, green, fresh or frozen" over canned, processed or packaged foods which are more sugary, salty and fatty. 70 to 75% of food eaten should be vegetables and fruit. Three meals at set times with snacks allowed between meals, but they must be fruit or vegetables. Aim to eat over a 12 hour period , example 7 am to 7 pm, and STOP after  your last meal of the day. Drink water,generally about 64 ounces per day, no other drink is as healthy. Fruit juice is best enjoyed in a healthy way, by EATING the fruit.  Thanks for choosing [facility], we consider it a privelige to serve you.

## 2022-11-03 ENCOUNTER — Other Ambulatory Visit: Payer: Self-pay | Admitting: Family Medicine

## 2022-11-07 ENCOUNTER — Encounter: Payer: Self-pay | Admitting: Family Medicine

## 2022-11-07 DIAGNOSIS — Z3042 Encounter for surveillance of injectable contraceptive: Secondary | ICD-10-CM | POA: Insufficient documentation

## 2022-11-07 NOTE — Assessment & Plan Note (Signed)
Updated lab needed.  

## 2022-11-07 NOTE — Assessment & Plan Note (Signed)
Controlled, no change in medication DASH diet and commitment to daily physical activity for a minimum of 30 minutes discussed and encouraged, as a part of hypertension management. The importance of attaining a healthy weight is also discussed.     10/30/2022    4:38 PM 10/30/2022    4:18 PM 08/07/2022    4:13 PM 06/12/2022    2:10 PM 06/05/2022    4:49 PM 06/05/2022    4:20 PM 05/12/2022    4:03 PM  BP/Weight  Systolic BP 124 124 116 143 136 122 136  Diastolic BP 84 86 78 91 88 87 84  Wt. (Lbs)  201.04 199.06 195  194 188.08  BMI  35.61 kg/m2 35.26 kg/m2 34.54 kg/m2  34.37 kg/m2 33.32 kg/m2

## 2022-11-07 NOTE — Assessment & Plan Note (Signed)
Hyperlipidemia:Low fat diet discussed and encouraged.   Lipid Panel  Lab Results  Component Value Date   CHOL 228 (H) 06/06/2022   HDL 72 06/06/2022   LDLCALC 146 (H) 06/06/2022   TRIG 56 06/06/2022   CHOLHDL 3.2 06/06/2022     Updated lab needed at/ before next visit. Needs to lowwr fat intake

## 2022-11-07 NOTE — Assessment & Plan Note (Signed)
Depo Provera 150 mg administered IM and she will return in 12 weeks for next injection

## 2022-11-07 NOTE — Progress Notes (Signed)
   Martha Montgomery     MRN: [id number]      DOB: 1981/03/22   HPI Martha Montgomery is here for follow up and re-evaluation of chronic medical conditions, medication management and review of any available recent lab and radiology data.  Preventive health is updated, specifically  Cancer screening and Immunization.   Feels stressed, overwhelmed , and disappointed, has been unable  tolive independently and in less than 1 year is out of her  home and going to stay with a cousin, states she will " get it together ", not giving up. The PT denies any adverse reactions to current medications since the last visit.     ROS Denies recent fever or chills. Denies sinus pressure, nasal congestion, ear pain or sore throat. Denies chest congestion, productive cough or wheezing. Denies chest pains, palpitations and leg swelling Denies abdominal pain, nausea, vomiting,diarrhea or constipation.   Denies dysuria, frequency, hesitancy or incontinence. Denies joint pain, swelling and limitation in mobility. Denies headaches, seizures, numbness, or tingling. Denies skin break down or rash.   PE  BP 124/84   Pulse 98   Ht 5\' 3"  (1.6 m)   Wt 201 lb 0.6 oz (91.2 kg)   SpO2 97%   BMI 35.61 kg/m   Patient alert and oriented and in no cardiopulmonary distress.  HEENT: No facial asymmetry, EOMI,     Neck supple .  Chest: Clear to auscultation bilaterally.  CVS: S1, S2 no murmurs, no S3.Regular rate.  ABD: Soft non tender.   Ext: No edema  MS: Adequate ROM spine, shoulders, hips and knees.  Skin: Intact, no ulcerations or rash noted.  Psych: Good eye contact, slightly tearful at times. Memory intact not anxious or depressed appearing.  CNS: CN 2-12 intact, power,  normal throughout.no focal deficits noted.   Assessment & Plan  Hypertension Controlled, no change in medication DASH diet and commitment to daily physical activity for a minimum of 30 minutes discussed and encouraged, as a part of  hypertension management. The importance of attaining a healthy weight is also discussed.     10/30/2022    4:38 PM 10/30/2022    4:18 PM 08/07/2022    4:13 PM 06/12/2022    2:10 PM 06/05/2022    4:49 PM 06/05/2022    4:20 PM 05/12/2022    4:03 PM  BP/Weight  Systolic BP 124 124 116 143 136 122 136  Diastolic BP 84 86 78 91 88 87 84  Wt. (Lbs)  201.04 199.06 195  194 188.08  BMI  35.61 kg/m2 35.26 kg/m2 34.54 kg/m2  34.37 kg/m2 33.32 kg/m2       Post-surgical hypothyroidism Updated lab needed   Hyperlipidemia Hyperlipidemia:Low fat diet discussed and encouraged.   Lipid Panel  Lab Results  Component Value Date   CHOL 228 (H) 06/06/2022   HDL 72 06/06/2022   LDLCALC 146 (H) 06/06/2022   TRIG 56 06/06/2022   CHOLHDL 3.2 06/06/2022     Updated lab needed at/ before next visit. Needs to lowwr fat intake  Generalized joint pain On chronic pain management through pain management team  Encounter for surveillance of injectable contraceptive Depo Provera 150 mg administered IM and she will return in 12 weeks for next injection

## 2022-11-07 NOTE — Assessment & Plan Note (Signed)
On chronic pain management through pain management team

## 2022-11-12 DIAGNOSIS — M797 Fibromyalgia: Secondary | ICD-10-CM | POA: Diagnosis not present

## 2022-11-12 DIAGNOSIS — M542 Cervicalgia: Secondary | ICD-10-CM | POA: Diagnosis not present

## 2022-11-12 DIAGNOSIS — M47812 Spondylosis without myelopathy or radiculopathy, cervical region: Secondary | ICD-10-CM | POA: Diagnosis not present

## 2022-11-12 DIAGNOSIS — M25562 Pain in left knee: Secondary | ICD-10-CM | POA: Diagnosis not present

## 2022-11-12 DIAGNOSIS — Z0130 Encounter for examination of blood pressure without abnormal findings: Secondary | ICD-10-CM | POA: Diagnosis not present

## 2022-11-12 DIAGNOSIS — Z79899 Other long term (current) drug therapy: Secondary | ICD-10-CM | POA: Diagnosis not present

## 2022-11-12 DIAGNOSIS — I10 Essential (primary) hypertension: Secondary | ICD-10-CM | POA: Diagnosis not present

## 2022-11-12 DIAGNOSIS — G8929 Other chronic pain: Secondary | ICD-10-CM | POA: Diagnosis not present

## 2022-11-12 DIAGNOSIS — M1712 Unilateral primary osteoarthritis, left knee: Secondary | ICD-10-CM | POA: Diagnosis not present

## 2022-11-18 ENCOUNTER — Other Ambulatory Visit: Payer: Self-pay | Admitting: Family Medicine

## 2022-11-24 ENCOUNTER — Encounter: Payer: Self-pay | Admitting: Internal Medicine

## 2022-11-24 ENCOUNTER — Ambulatory Visit (INDEPENDENT_AMBULATORY_CARE_PROVIDER_SITE_OTHER): Payer: Medicare Other | Admitting: Internal Medicine

## 2022-11-24 VITALS — BP 122/81 | HR 94 | Resp 16 | Ht 63.0 in | Wt 202.0 lb

## 2022-11-24 DIAGNOSIS — R7989 Other specified abnormal findings of blood chemistry: Secondary | ICD-10-CM

## 2022-11-24 DIAGNOSIS — J302 Other seasonal allergic rhinitis: Secondary | ICD-10-CM

## 2022-11-24 DIAGNOSIS — Z0000 Encounter for general adult medical examination without abnormal findings: Secondary | ICD-10-CM

## 2022-11-24 DIAGNOSIS — H539 Unspecified visual disturbance: Secondary | ICD-10-CM | POA: Diagnosis not present

## 2022-11-24 MED ORDER — FLUTICASONE PROPIONATE 50 MCG/ACT NA SUSP
2.0000 | Freq: Every day | NASAL | 1 refills | Status: AC
Start: 2022-11-24 — End: ?

## 2022-11-24 MED ORDER — VITAMIN D3 25 MCG (1000 UT) PO CAPS
1000.0000 [IU] | ORAL_CAPSULE | Freq: Every day | ORAL | 1 refills | Status: DC
Start: 2022-11-24 — End: 2023-07-30

## 2022-11-24 NOTE — Patient Instructions (Addendum)
  Ms. Verley , Thank you for taking time to come for your Medicare Wellness Visit. I appreciate your ongoing commitment to your health goals. Please review the following plan we discussed and let me know if I can assist you in the future.   These are the goals we discussed: Patient would like to figure out why she has some her symptoms and if this is related to possible autoimmune condition.   This is a list of the screening recommended for you and due dates:  Health Maintenance  Topic Date Due   HPV Vaccine (3 - Risk 3-dose series) 07/07/2009   DTaP/Tdap/Td vaccine (2 - Tdap) 03/08/2019   COVID-19 Vaccine (3 - Pfizer risk series) 04/02/2020   Flu Shot  02/26/2023   Medicare Annual Wellness Visit  08/08/2023   Pap Smear  08/07/2025   Hepatitis C Screening: USPSTF Recommendation to screen - Ages 18-79 yo.  Completed   HIV Screening  Completed

## 2022-11-24 NOTE — Progress Notes (Signed)
Subjective:   Martha Montgomery is a 42 y.o. female who presents for Medicare Annual (Subsequent) preventive examination.  Review of Systems    Review of Systems  All other systems reviewed and are negative.   Objective:    Today's Vitals   11/24/22 1607  BP: 122/81  Pulse: 94  Resp: 16  SpO2: 95%  Weight: 202 lb (91.6 kg)  Height: 5\' 3"  (1.6 m)  PainSc: 0-No pain   Body mass index is 35.78 kg/m.     11/24/2022    4:18 PM 01/12/2022   12:46 AM 10/28/2021    4:32 PM 07/08/2021    1:11 PM 06/09/2021    3:32 PM 06/27/2020    3:36 PM 03/18/2020    7:47 PM  Advanced Directives  Does Patient Have a Medical Advance Directive? Yes No No No No No No  Does patient want to make changes to medical advance directive? Yes (ED - Information included in AVS)        Would patient like information on creating a medical advance directive?  No - Patient declined No - Patient declined No - Patient declined No - Patient declined No - Patient declined No - Patient declined    Current Medications (verified) Outpatient Encounter Medications as of 11/24/2022  Medication Sig   amLODipine (NORVASC) 10 MG tablet TAKE 1 TABLET(10 MG) BY MOUTH DAILY   carvedilol (COREG) 3.125 MG tablet Take 1 tablet (3.125 mg total) by mouth 2 (two) times daily with a meal.   Cholecalciferol (VITAMIN D3) 25 MCG (1000 UT) CAPS Take 1 capsule (1,000 Units total) by mouth daily.   diphenhydrAMINE (BENADRYL) 25 MG tablet Take 25 mg by mouth in the morning and at bedtime.   EPINEPHrine 0.3 mg/0.3 mL IJ SOAJ injection Inject 0.3 mg into the muscle as needed for anaphylaxis.   fluticasone (FLONASE) 50 MCG/ACT nasal spray Place 2 sprays into both nostrils daily.   levothyroxine (SYNTHROID) 200 MCG tablet TAKE 1 TABLET(200 MCG) BY MOUTH AT BEDTIME   linaclotide (LINZESS) 290 MCG CAPS capsule Take 1 capsule (290 mcg total) by mouth daily before breakfast.   medroxyPROGESTERone (DEPO-PROVERA) 150 MG/ML injection Inject 150 mg into  the muscle every 3 (three) months.   morphine (MS CONTIN) 15 MG 12 hr tablet Take 15 mg by mouth 3 (three) times daily as needed.   pantoprazole (PROTONIX) 40 MG tablet TAKE 1 TABLET BY MOUTH TWICE DAILY BEFORE A MEAL   pregabalin (LYRICA) 150 MG capsule Take 150 mg by mouth 3 (three) times daily.   rosuvastatin (CRESTOR) 20 MG tablet Take 1 tablet (20 mg total) by mouth daily.   traMADol (ULTRAM) 50 MG tablet Take by mouth every 12 (twelve) hours as needed.   triamterene-hydrochlorothiazide (MAXZIDE) 75-50 MG tablet TAKE 1 TABLET BY MOUTH DAILY   No facility-administered encounter medications on file as of 11/24/2022.    Allergies (verified) Aspirin, Codeine, Diclofenac, Ibuprofen, Metronidazole, Nsaids, and Soybean-containing drug products   History: Past Medical History:  Diagnosis Date   Anxiety    Anxiety disorder    Arthritis    "knees, joints, hands, toes" (09/28/2013)   Depression    Depression, major, single episode, in partial remission (HCC) 10/18/2008   Qualifier: Diagnosis of  By: Lodema Hong MD, Margaret  PHQ 9 score of 7 in 10/2017, not suicidal or homicidal, interested in telepsych however, score of 16 in  02/2018, not suicidal or homicidal, start medication and refer to telepsych Score of 7 in 07/2018  Fibromyalgia    Gastritis nov. 2011   EGD by Dr. Darrick Penna, negative h pylori   GERD (gastroesophageal reflux disease)    Gout    Hypertension x 6 years    Hypothyroidism    Migraines    "15/month average" (09/28/2013)   Obesity (BMI 30.0-34.9)    Spinal stenosis    Thyroid goiter    Past Surgical History:  Procedure Laterality Date   BIOPSY  10/20/2017   Procedure: BIOPSY;  Surgeon: West Bali, MD;  Location: [facility];  Service: Endoscopy;;  gastric duodenal esophagus   CESAREAN SECTION  2008   CHONDROPLASTY  04/02/2012   Procedure: CHONDROPLASTY;  Surgeon: Vickki Hearing, MD;  Location: [facility];  Service: Orthopedics;  Laterality: Left;  of left patella    COLONOSCOPY  11/2010   Dr. Darrick Penna: normal colon, small internal hemorrhoids   COLONOSCOPY WITH PROPOFOL N/A 10/22/2017   Surgeon: West Bali, MD; significant looping of the colon, internal hemorrhoids, otherwise normal exam.  Recommended 10-year surveillance.   COLONOSCOPY WITH PROPOFOL N/A 10/20/2017   Procedure: COLONOSCOPY WITH PROPOFOL;  Surgeon: West Bali, MD;  Location: [facility];  Service: Endoscopy;  Laterality: N/A;  11:00am   ESOPHAGOGASTRODUODENOSCOPY (EGD) WITH PROPOFOL N/A 10/20/2017   Surgeon: West Bali, MD; no endoscopic abnormality to explain dysphagia s/p empiric dilation, gastritis.  Esophageal biopsies consistent with reflux, gastric biopsies with gastric antral and oxyntic mucosa negative for H. pylori, duodenal biopsies benign.   INCISION AND DRAINAGE ABSCESS  1997; 2005   "under right buttocks; under left arm"   SAVORY DILATION N/A 10/20/2017   Procedure: SAVORY DILATION;  Surgeon: West Bali, MD;  Location: [facility];  Service: Endoscopy;  Laterality: N/A;   THYROIDECTOMY N/A 09/28/2013   Procedure: TOTAL THYROIDECTOMY;  Surgeon: Darletta Moll, MD;  Location: [facility];  Service: ENT;  Laterality: N/A;   TOTAL THYROIDECTOMY  09/27/2013   UPPER GI ENDOSCOPY  2011   EGD: gastritis, no h.pylori   Family History  Problem Relation Age of Onset   Arthritis Mother    Migraines Mother    Hypertension Mother    Cancer Maternal Grandmother        lung   Asthma Other        family history    Colon cancer Neg Hx    Social History   Socioeconomic History   Marital status: Legally Separated    Spouse name: Not on file   Number of children: 1   Years of education: 12   Highest education level: 12th grade  Occupational History   Occupation: Banker of Human resources officer: UNEMPLOYED  Tobacco Use   Smoking status: Former    Packs/day: 1.00    Years: 5.00    Additional pack years: 0.00    Total pack  years: 5.00    Types: Cigarettes    Quit date: 09/11/2010    Years since quitting: 12.2   Smokeless tobacco: Never   Tobacco comments:    QUIT SMOKING X 3 YEARS AGO,was vaping but has since quit completely  Vaping Use   Vaping Use: Every day  Substance and Sexual Activity   Alcohol use: No   Drug use: No   Sexual activity: Not Currently    Partners: Male    Birth control/protection: Other-see comments, Injection    Comment: Depo  Other Topics Concern   Not on file  Social History Narrative  Pt is currently on medicaid. From [place] and came to [facility] in mid 2010   Is separated from Husband, lives with Mother right now    Social Determinants of Health   Financial Resource Strain: Medium Risk (06/22/2017)   Overall Financial Resource Strain (CARDIA)    Difficulty of Paying Living Expenses: Somewhat hard  Food Insecurity: Food Insecurity Present (08/30/2020)   Hunger Vital Sign    Worried About Running Out of Food in the Last Year: Sometimes true    Ran Out of Food in the Last Year: Sometimes true  Transportation Needs: No Transportation Needs (08/30/2020)   PRAPARE - Administrator, Civil Service (Medical): No    Lack of Transportation (Non-Medical): No  Physical Activity: Sufficiently Active (08/30/2020)   Exercise Vital Sign    Days of Exercise per Week: 6 days    Minutes of Exercise per Session: 80 min  Stress: No Stress Concern Present (08/12/2018)   [employer] of Occupational Health - Occupational Stress Questionnaire    Feeling of Stress : Only a little  Social Connections: Moderately Isolated (08/30/2020)   Social Connection and Isolation Panel [NHANES]    Frequency of Communication with Friends and Family: More than three times a week    Frequency of Social Gatherings with Friends and Family: More than three times a week    Attends Religious Services: More than 4 times per year    Active Member of [employer] or Organizations: No    Attends Banker  Meetings: Never    Marital Status: Separated    Tobacco Counseling Counseling given: Not Answered Tobacco comments: QUIT SMOKING X 3 YEARS AGO,was vaping but has since quit completely   Clinical Intake:  Pre-visit preparation completed: Yes  Pain : No/denies pain Pain Score: 0-No pain    How often do you need to have someone help you when you read instructions, pamphlets, or other written materials from your doctor or pharmacy?: 1 - Never What is the last grade level you completed in school?: college  Diabetic?No    Activities of Daily Living    11/24/2022    4:20 PM  In your present state of health, do you have any difficulty performing the following activities:  Hearing? 0  Vision? 0  Difficulty concentrating or making decisions? 0  Walking or climbing stairs? 0  Dressing or bathing? 0  Doing errands, shopping? 0    Patient Care Team: Kerri Perches, MD as PCP - General [place], [place], [facility] (Inactive) (Pharmacist)  Indicate any recent Medical Services you may have received from other than [facility] providers in the past year (date may be approximate).     Assessment:   This is a routine wellness examination for [place].  Hearing/Vision screen No results found.  Dietary issues and exercise activities discussed:     Goals Addressed   None    Depression Screen    11/24/2022    4:20 PM 11/24/2022    4:07 PM 10/30/2022    4:20 PM 08/07/2022    4:17 PM 06/05/2022    4:23 PM 05/12/2022    4:04 PM 03/13/2022    4:30 PM  PHQ 2/9 Scores  PHQ - 2 Score 0 0 2 0 2 0 0  PHQ- 9 Score   6  8      Fall Risk    11/24/2022    4:20 PM 11/24/2022    4:07 PM 10/30/2022    4:19 PM 08/07/2022  4:17 PM 06/05/2022    4:23 PM  Fall Risk   Falls in the past year? 0 0 0 0 0  Number falls in past yr:  0 0 0 0  Injury with Fall?  0 0 0 0  Risk for fall due to :   No Fall Risks No Fall Risks No Fall Risks  Follow up   Falls evaluation completed Falls evaluation  completed Falls evaluation completed    FALL RISK PREVENTION PERTAINING TO THE HOME:  Any stairs in or around the home? Yes  If so, are there any without handrails? Yes  Home free of loose throw rugs in walkways, pet beds, electrical cords, etc? Yes  Adequate lighting in your home to reduce risk of falls? Yes   ASSISTIVE DEVICES UTILIZED TO PREVENT FALLS:  Life alert? No  Use of a cane, walker or w/c? No  Grab bars in the bathroom? No  Shower chair or bench in shower? No  Elevated toilet seat or a handicapped toilet? No    Cognitive Function:        11/24/2022    4:20 PM 10/28/2021    4:36 PM 08/30/2020    4:05 PM 08/30/2019    1:14 PM 08/12/2018    4:03 PM  6CIT Screen  What Year? 0 points 0 points 0 points 0 points 0 points  What month? 0 points 0 points 0 points 0 points 0 points  What time? 0 points 0 points 0 points 0 points 3 points  Count back from 20 0 points 0 points 0 points 0 points 0 points  Months in reverse 0 points 0 points 0 points 0 points 0 points  Repeat phrase 0 points 0 points 2 points 0 points 0 points  Total Score 0 points 0 points 2 points 0 points 3 points    Immunizations Immunization History  Administered Date(s) Administered   Hpv-Unspecified 10/18/2008, 03/07/2009   Influenza Split 03/26/2016   Influenza Whole 06/07/2009, 03/28/2010   Influenza,inj,Quad PF,6+ Mos 05/30/2014, 06/28/2015, 06/22/2017, 03/16/2018, 04/20/2019, 03/27/2020, 09/03/2021, 03/13/2022   Influenza-Unspecified 03/28/2013   PFIZER(Purple Top)SARS-COV-2 Vaccination 02/07/2020, 03/05/2020   Td 03/07/2009    TDAP status: Up to date Our record is not up to date. Patient is going to bring record to next appoitment  Flu Vaccine status: Up to date  Covid-19 vaccine status: Information provided on how to obtain vaccines.   Qualifies for Shingles Vaccine? No   Zostavax completed No   Shingrix Completed?: No.    Education has been provided regarding the importance of this  vaccine. Patient has been advised to call insurance company to determine out of pocket expense if they have not yet received this vaccine. Advised may also receive vaccine at local pharmacy or Health Dept. Verbalized acceptance and understanding.  Screening Tests Health Maintenance  Topic Date Due   HPV VACCINES (3 - Risk 3-dose series) 07/07/2009   DTaP/Tdap/Td (2 - Tdap) 03/08/2019   COVID-19 Vaccine (3 - Pfizer risk series) 04/02/2020   INFLUENZA VACCINE  02/26/2023   Medicare Annual Wellness (AWV)  08/08/2023   PAP SMEAR-Modifier  08/07/2025   Hepatitis C Screening  Completed   HIV Screening  Completed    Health Maintenance  Health Maintenance Due  Topic Date Due   HPV VACCINES (3 - Risk 3-dose series) 07/07/2009   DTaP/Tdap/Td (2 - Tdap) 03/08/2019   COVID-19 Vaccine (3 - Pfizer risk series) 04/02/2020    Mammogram status: Completed 10/09/2022. Repeat every year  Lung  Cancer Screening: (Low Dose CT Chest recommended if Age 62-80 years, 30 pack-year currently smoking OR have quit w/in 15years.) does not qualify.    Additional Screening:  Hepatitis C Screening: does not qualify; Completed 02/15/2021  Vision Screening: Recommended annual ophthalmology exams for early detection of glaucoma and other disorders of the eye. Is the patient up to date with their annual eye exam?  No  Who is the provider or what is the name of the office in which the patient attends annual eye exams?  If pt is not established with a provider, would they like to be referred to a provider to establish care? No .   Dental Screening: Recommended annual dental exams for proper oral hygiene  Community Resource Referral / Chronic Care Management: CRR required this visit?  No   CCM required this visit?  No      Plan:     I have personally reviewed and noted the following in the patient's chart:   Medical and social history Use of alcohol, tobacco or illicit drugs  Current medications and  supplements including opioid prescriptions. Patient is currently taking opioid prescriptions. Information provided to patient regarding non-opioid alternatives. Patient advised to discuss non-opioid treatment plan with their provider. Functional ability and status Nutritional status Physical activity Advanced directives List of other physicians Hospitalizations, surgeries, and ER visits in previous 12 months Vitals Screenings to include cognitive, depression, and falls Referrals and appointments  In addition, I have reviewed and discussed with patient certain preventive protocols, quality metrics, and best practice recommendations. A written personalized care plan for preventive services as well as general preventive health recommendations were provided to patient.     Milus Banister, MD   11/24/2022

## 2022-12-12 DIAGNOSIS — E559 Vitamin D deficiency, unspecified: Secondary | ICD-10-CM | POA: Diagnosis not present

## 2022-12-12 DIAGNOSIS — M129 Arthropathy, unspecified: Secondary | ICD-10-CM | POA: Diagnosis not present

## 2022-12-12 DIAGNOSIS — R5383 Other fatigue: Secondary | ICD-10-CM | POA: Diagnosis not present

## 2022-12-12 DIAGNOSIS — M47812 Spondylosis without myelopathy or radiculopathy, cervical region: Secondary | ICD-10-CM | POA: Diagnosis not present

## 2022-12-12 DIAGNOSIS — Z0130 Encounter for examination of blood pressure without abnormal findings: Secondary | ICD-10-CM | POA: Diagnosis not present

## 2022-12-12 DIAGNOSIS — Z131 Encounter for screening for diabetes mellitus: Secondary | ICD-10-CM | POA: Diagnosis not present

## 2022-12-12 DIAGNOSIS — M797 Fibromyalgia: Secondary | ICD-10-CM | POA: Diagnosis not present

## 2022-12-12 DIAGNOSIS — Z79899 Other long term (current) drug therapy: Secondary | ICD-10-CM | POA: Diagnosis not present

## 2022-12-12 DIAGNOSIS — G8929 Other chronic pain: Secondary | ICD-10-CM | POA: Diagnosis not present

## 2022-12-12 DIAGNOSIS — M25562 Pain in left knee: Secondary | ICD-10-CM | POA: Diagnosis not present

## 2022-12-12 DIAGNOSIS — I10 Essential (primary) hypertension: Secondary | ICD-10-CM | POA: Diagnosis not present

## 2022-12-12 DIAGNOSIS — M1712 Unilateral primary osteoarthritis, left knee: Secondary | ICD-10-CM | POA: Diagnosis not present

## 2022-12-12 DIAGNOSIS — E7800 Pure hypercholesterolemia, unspecified: Secondary | ICD-10-CM | POA: Diagnosis not present

## 2022-12-13 DIAGNOSIS — I10 Essential (primary) hypertension: Secondary | ICD-10-CM | POA: Diagnosis not present

## 2022-12-13 DIAGNOSIS — R0602 Shortness of breath: Secondary | ICD-10-CM | POA: Diagnosis not present

## 2022-12-13 DIAGNOSIS — R030 Elevated blood-pressure reading, without diagnosis of hypertension: Secondary | ICD-10-CM | POA: Diagnosis not present

## 2023-01-11 DIAGNOSIS — R5383 Other fatigue: Secondary | ICD-10-CM | POA: Diagnosis not present

## 2023-01-11 DIAGNOSIS — Z79899 Other long term (current) drug therapy: Secondary | ICD-10-CM | POA: Diagnosis not present

## 2023-01-11 DIAGNOSIS — M542 Cervicalgia: Secondary | ICD-10-CM | POA: Diagnosis not present

## 2023-01-11 DIAGNOSIS — M25561 Pain in right knee: Secondary | ICD-10-CM | POA: Diagnosis not present

## 2023-01-11 DIAGNOSIS — M25562 Pain in left knee: Secondary | ICD-10-CM | POA: Diagnosis not present

## 2023-01-11 DIAGNOSIS — M1712 Unilateral primary osteoarthritis, left knee: Secondary | ICD-10-CM | POA: Diagnosis not present

## 2023-01-11 DIAGNOSIS — M47812 Spondylosis without myelopathy or radiculopathy, cervical region: Secondary | ICD-10-CM | POA: Diagnosis not present

## 2023-01-11 DIAGNOSIS — G8929 Other chronic pain: Secondary | ICD-10-CM | POA: Diagnosis not present

## 2023-01-11 DIAGNOSIS — M129 Arthropathy, unspecified: Secondary | ICD-10-CM | POA: Diagnosis not present

## 2023-01-11 DIAGNOSIS — E7800 Pure hypercholesterolemia, unspecified: Secondary | ICD-10-CM | POA: Diagnosis not present

## 2023-01-11 DIAGNOSIS — Z131 Encounter for screening for diabetes mellitus: Secondary | ICD-10-CM | POA: Diagnosis not present

## 2023-01-11 DIAGNOSIS — I10 Essential (primary) hypertension: Secondary | ICD-10-CM | POA: Diagnosis not present

## 2023-01-14 ENCOUNTER — Other Ambulatory Visit: Payer: Self-pay

## 2023-01-14 ENCOUNTER — Emergency Department (HOSPITAL_COMMUNITY)
Admission: EM | Admit: 2023-01-14 | Discharge: 2023-01-14 | Disposition: A | Discharge status: Home / Self Care | Payer: Medicare Other | Attending: Emergency Medicine | Admitting: Emergency Medicine

## 2023-01-14 ENCOUNTER — Emergency Department (HOSPITAL_COMMUNITY): Payer: Medicare Other

## 2023-01-14 ENCOUNTER — Encounter (HOSPITAL_COMMUNITY): Payer: Self-pay | Admitting: Emergency Medicine

## 2023-01-14 DIAGNOSIS — R600 Localized edema: Secondary | ICD-10-CM | POA: Insufficient documentation

## 2023-01-14 DIAGNOSIS — R1030 Lower abdominal pain, unspecified: Secondary | ICD-10-CM | POA: Diagnosis present

## 2023-01-14 DIAGNOSIS — Z79899 Other long term (current) drug therapy: Secondary | ICD-10-CM | POA: Insufficient documentation

## 2023-01-14 DIAGNOSIS — R1032 Left lower quadrant pain: Secondary | ICD-10-CM | POA: Diagnosis not present

## 2023-01-14 DIAGNOSIS — R1031 Right lower quadrant pain: Secondary | ICD-10-CM | POA: Diagnosis not present

## 2023-01-14 DIAGNOSIS — I10 Essential (primary) hypertension: Secondary | ICD-10-CM | POA: Diagnosis not present

## 2023-01-14 DIAGNOSIS — M7989 Other specified soft tissue disorders: Secondary | ICD-10-CM | POA: Diagnosis not present

## 2023-01-14 DIAGNOSIS — R109 Unspecified abdominal pain: Secondary | ICD-10-CM | POA: Diagnosis not present

## 2023-01-14 DIAGNOSIS — E039 Hypothyroidism, unspecified: Secondary | ICD-10-CM | POA: Diagnosis not present

## 2023-01-14 LAB — COMPREHENSIVE METABOLIC PANEL
ALT: 17 U/L (ref 0–44)
AST: 19 U/L (ref 15–41)
Albumin: 3.9 g/dL (ref 3.5–5.0)
Alkaline Phosphatase: 49 U/L (ref 38–126)
Anion gap: 8 (ref 5–15)
BUN: 22 mg/dL — ABNORMAL HIGH (ref 6–20)
CO2: 24 mmol/L (ref 22–32)
Calcium: 9.2 mg/dL (ref 8.9–10.3)
Chloride: 103 mmol/L (ref 98–111)
Creatinine, Ser: 1.03 mg/dL — ABNORMAL HIGH (ref 0.44–1.00)
GFR, Estimated: >60 mL/min (ref >60)
Glucose, Bld: 87 mg/dL (ref 70–99)
Potassium: 3.1 mmol/L — ABNORMAL LOW (ref 3.5–5.1)
Sodium: 135 mmol/L (ref 135–145)
Total Bilirubin: 0.7 mg/dL (ref 0.3–1.2)
Total Protein: 7.4 g/dL (ref 6.5–8.1)

## 2023-01-14 LAB — URINALYSIS, ROUTINE W REFLEX MICROSCOPIC
Bilirubin Urine: NEGATIVE
Glucose, UA: NEGATIVE mg/dL
Hgb urine dipstick: NEGATIVE
Ketones, ur: NEGATIVE mg/dL
Nitrite: NEGATIVE
Protein, ur: 30 mg/dL — AB
Specific Gravity, Urine: 1.024 (ref 1.005–1.030)
pH: 5 (ref 5.0–8.0)

## 2023-01-14 LAB — CBC
HCT: 38.9 % (ref 36.0–46.0)
Hemoglobin: 13.3 g/dL (ref 12.0–15.0)
MCH: 30.6 pg (ref 26.0–34.0)
MCHC: 34.2 g/dL (ref 30.0–36.0)
MCV: 89.4 fL (ref 80.0–100.0)
Platelets: 398 10*3/uL (ref 150–400)
RBC: 4.35 MIL/uL (ref 3.87–5.11)
RDW: 12.9 % (ref 11.5–15.5)
WBC: 6.5 10*3/uL (ref 4.0–10.5)
nRBC: 0 % (ref 0.0–0.2)

## 2023-01-14 LAB — LIPASE, BLOOD: Lipase: 37 U/L (ref 11–51)

## 2023-01-14 LAB — PREGNANCY, URINE: Preg Test, Ur: NEGATIVE

## 2023-01-14 LAB — BRAIN NATRIURETIC PEPTIDE: B Natriuretic Peptide: 5 pg/mL (ref 0.0–100.0)

## 2023-01-14 MED ORDER — IOHEXOL 300 MG/ML  SOLN
100.0000 mL | Freq: Once | INTRAMUSCULAR | Status: AC | PRN
  Administered 2023-01-14: 100 mL via INTRAVENOUS

## 2023-01-14 MED ORDER — POTASSIUM CHLORIDE CRYS ER 20 MEQ PO TBCR
10.0000 meq | EXTENDED_RELEASE_TABLET | Freq: Once | ORAL | Status: AC
  Administered 2023-01-14: 10 meq via ORAL
  Filled 2023-01-14: qty 1

## 2023-01-14 NOTE — Discharge Instructions (Addendum)
No CT shows no acute abdominal or pelvis process. You can take GasX OTC for relief of abdominal distention.    Feet swelling can be due to fibromyalgia, especially if you are not having relief of symptoms with feet elevation above heart level.  Follow up with your GI specialist and rheumatologist for further evaluation of symptoms.   Get help right away if: You cannot stop vomiting. Your pain is only in one part of your belly, like on the right side. You have bloody or black poop, or poop that looks like tar. You have trouble breathing. You have chest pain.

## 2023-01-14 NOTE — ED Notes (Signed)
Patient transported to CT 

## 2023-01-14 NOTE — ED Triage Notes (Signed)
Pt c/o bilateral feet and leg swelling for a few days and sharp lower abdominal pain and distention x 1 week r/t chronic constipation. Last normal BM was yesterday but pt states she is still constipated. Pt has been sweaty but denies n/v/dizziness.

## 2023-01-14 NOTE — ED Notes (Signed)
ED Provider at bedside. 

## 2023-01-14 NOTE — ED Provider Notes (Signed)
[place] EMERGENCY DEPARTMENT AT [facility] Provider Note   CSN: [id number] Arrival date & time: 01/14/23  1217     History  Chief Complaint  Patient presents with   Leg Swelling   Abdominal Pain    Martha Montgomery is a 42 y.o. female with a history of fibromyalgia, hypothyroidism, tension, and spinal stenosis who presents to the ED today for abdominal pain and distention as well as feet swelling.  She states that she has IBS-C and has had about 2 bowel movements in the past several days with a feeling of incomplete evacuation. She reports abdominal distention and worst pain in her lower abdomen than usual. Additionally, she admits to mucus in her stools.  Subsequently, patient reports that she has had swelling in her feet for the past 3 days.  She takes Hydrochlorothiazide daily and has been elevating her legs above heart level but denies any relief from swelling.  Additionally she reports low-grade fever for the past several days. No additional complaints or concerns at this time.    Home Medications Prior to Admission medications   Medication Sig Start Date End Date Taking? Authorizing Provider  amLODipine (NORVASC) 10 MG tablet TAKE 1 TABLET(10 MG) BY MOUTH DAILY 07/16/21   Kerri Perches, MD  carvedilol (COREG) 3.125 MG tablet Take 1 tablet (3.125 mg total) by mouth 2 (two) times daily with a meal. 06/05/22   Kerri Perches, MD  Cholecalciferol (VITAMIN D3) 25 MCG (1000 UT) CAPS Take 1 capsule (1,000 Units total) by mouth daily. 11/24/22   Gardenia Phlegm, MD  diphenhydrAMINE (BENADRYL) 25 MG tablet Take 25 mg by mouth in the morning and at bedtime.    [provider]  EPINEPHrine 0.3 mg/0.3 mL IJ SOAJ injection Inject 0.3 mg into the muscle as needed for anaphylaxis. 01/12/22   Gilda Crease, MD  fluticasone (FLONASE) 50 MCG/ACT nasal spray Place 2 sprays into both nostrils daily. 11/24/22   Gardenia Phlegm, MD  levothyroxine (SYNTHROID) 200 MCG  tablet TAKE 1 TABLET(200 MCG) BY MOUTH AT BEDTIME 11/19/22   Kerri Perches, MD  linaclotide [facility]) 290 MCG CAPS capsule Take 1 capsule (290 mcg total) by mouth daily before breakfast. 06/12/22   Aida Raider, NP  medroxyPROGESTERone (DEPO-PROVERA) 150 MG/ML injection Inject 150 mg into the muscle every 3 (three) months.    [provider]  morphine (MS CONTIN) 15 MG 12 hr tablet Take 15 mg by mouth 3 (three) times daily as needed. 05/17/22   [provider]  pantoprazole (PROTONIX) 40 MG tablet TAKE 1 TABLET BY MOUTH TWICE DAILY BEFORE A MEAL 10/16/21   Tiffany Kocher, PA-C  pregabalin (LYRICA) 150 MG capsule Take 150 mg by mouth 3 (three) times daily. 10/01/21   [provider]  rosuvastatin (CRESTOR) 20 MG tablet Take 1 tablet (20 mg total) by mouth daily. 04/15/22   Kerri Perches, MD  traMADol (ULTRAM) 50 MG tablet Take by mouth every 12 (twelve) hours as needed.    [provider]  triamterene-hydrochlorothiazide (MAXZIDE) 75-50 MG tablet TAKE 1 TABLET BY MOUTH DAILY 11/03/22   Kerri Perches, MD      Allergies    Aspirin, Codeine, Diclofenac, Ibuprofen, Metronidazole, Nsaids, and Soybean-containing drug products    Review of Systems   Review of Systems  Cardiovascular:  Positive for leg swelling.  Gastrointestinal:  Positive for abdominal pain.  All other systems reviewed and are negative.   Physical Exam Updated  Vital Signs BP 117/71 (BP Location: Right Arm)   Pulse (!) 116   Temp 99 F (37.2 C) (Oral)   Resp 19   Ht 5\' 3"  (1.6 m)   Wt 92.5 kg   SpO2 99%   BMI 36.14 kg/m  Physical Exam Vitals and nursing note reviewed.  Constitutional:      Appearance: Normal appearance. She is well-developed.  HENT:     Head: Normocephalic and atraumatic.     Mouth/Throat:     Mouth: Mucous membranes are moist.  Eyes:     Conjunctiva/sclera: Conjunctivae normal.     Pupils: Pupils are equal, round, and reactive to light.   Cardiovascular:     Rate and Rhythm: Normal rate and regular rhythm.     Pulses: Normal pulses.  Pulmonary:     Effort: Pulmonary effort is normal.     Breath sounds: Normal breath sounds.  Abdominal:     Palpations: Abdomen is soft.     Tenderness: There is no abdominal tenderness. There is guarding.     Comments: Tenderness to the lower abdominal including LLQ, suprapubic region, and RLQ  Musculoskeletal:        General: No tenderness. Normal range of motion.     Right lower leg: Edema present.     Left lower leg: Edema present.     Comments: Edema to the left and right feet and ankles  Skin:    General: Skin is warm and dry.     Findings: No rash.  Neurological:     General: No focal deficit present.     Mental Status: She is alert.  Psychiatric:        Mood and Affect: Mood normal.        Behavior: Behavior normal.     ED Results / Procedures / Treatments   Labs (all labs ordered are listed, but only abnormal results are displayed) Labs Reviewed  URINALYSIS, ROUTINE W REFLEX MICROSCOPIC - Abnormal; Notable for the following components:      Result Value   APPearance HAZY (*)    Protein, ur 30 (*)    Leukocytes,Ua TRACE (*)    Bacteria, UA RARE (*)    All other components within normal limits  COMPREHENSIVE METABOLIC PANEL - Abnormal; Notable for the following components:   Potassium 3.1 (*)    BUN 22 (*)    Creatinine, Ser 1.03 (*)    All other components within normal limits  CBC  BRAIN NATRIURETIC PEPTIDE  PREGNANCY, URINE  LIPASE, BLOOD    EKG None  Radiology CT ABDOMEN PELVIS W CONTRAST  Result Date: 01/14/2023 CLINICAL DATA:  Right lower quadrant abdominal pain. EXAM: CT ABDOMEN AND PELVIS WITH CONTRAST TECHNIQUE: Multidetector CT imaging of the abdomen and pelvis was performed using the standard protocol following bolus administration of intravenous contrast. RADIATION DOSE REDUCTION: This exam was performed according to the departmental  dose-optimization program which includes automated exposure control, adjustment of the mA and/or kV according to patient size and/or use of iterative reconstruction technique. CONTRAST:  OMNIPAQUE IOHEXOL 300 MG/ML  SOLN COMPARISON:  None Available. FINDINGS: Lower chest: No acute abnormality. Hepatobiliary: No focal liver abnormality is seen. No gallstones, gallbladder wall thickening, or biliary dilatation. Pancreas: Unremarkable. No pancreatic ductal dilatation or surrounding inflammatory changes. Spleen: Normal in size without focal abnormality. Adrenals/Urinary Tract: Adrenal glands are unremarkable. Kidneys are normal, without renal calculi, focal lesion, or hydronephrosis. Bladder is unremarkable. Stomach/Bowel: Stomach is within normal limits. Appendix appears normal.  No evidence of bowel wall thickening, distention, or inflammatory changes. Vascular/Lymphatic: No significant vascular findings are present. No enlarged abdominal or pelvic lymph nodes. Reproductive: Uterus and bilateral adnexa are unremarkable. Other: No abdominal wall hernia or abnormality. No abdominopelvic ascites. Musculoskeletal: No acute or significant osseous findings. IMPRESSION: 1. No CT evidence of acute abdominal/pelvic process. 2. Normal appendix. No evidence of colitis or diverticulitis. 3. No evidence of nephrolithiasis or hydronephrosis. Electronically Signed   By: Larose Hires D.O.   On: 01/14/2023 16:14    Procedures Procedures: not indicated.   Medications Ordered in ED Medications  potassium chloride SA (KLOR-CON M) CR tablet 10 mEq (has no administration in time range)  iohexol (OMNIPAQUE) 300 MG/ML solution 100 mL (100 mLs Intravenous Contrast Given 01/14/23 1554)    ED Course/ Medical Decision Making/ A&P                             Medical Decision Making Amount and/or Complexity of Data Reviewed Labs: ordered. Radiology: ordered.  This patient presents to the ED for concern of feet swelling and  abdominal pain, this involves an extensive number of treatment options, and is a complaint that carries with it a high risk of complications and morbidity.   Differential diagnosis includes: gastroenteritis, colitis, appendicitis, UTI, constipation, acute fibromyalgia   Co morbidities that complicate the patient evaluation  Fibromyalgia Hypertension  Hypothyroidism  Additional history obtained:  Additional history obtained from patient's records  Lab Tests:  I ordered and personally interpreted labs.  The pertinent results include:   Potassium of 3.1 and BUN of 22 - patient most likely dehydrated. Oral potassium given prior to discharge. All other labs are within normal limit.   Imaging Studies ordered:  I ordered imaging studies including Ct abdomen/pelvis with contrast  I independently visualized and interpreted imaging which showed: no acute abdominal/pelvic process. I agree with the radiologist interpretation    Problem List / ED Course / Critical interventions / Medication management  Abdominal pain, feet swelling I ordered medications including: Potassium chloride for hypokalemia. Reevaluation of the patient after these medicines showed that the patient stayed the same I have reviewed the patients home medicines and have made adjustments as needed   Social Determinants of Health:  Access to healthcare   Test / Admission - Considered:  Discussed results with patient.  Portance of hydration.  Advised to try Gas-X for abdominal distention. Follow-up with your primary care in 1 to 2 days for reevaluation. Strict return precautions provided.       Final Clinical Impression(s) / ED Diagnoses Final diagnoses:  Lower abdominal pain    Rx / DC Orders ED Discharge Orders     None         Maxwell Marion, PA-C 01/14/23 1656    Pricilla Loveless, MD 01/17/23 1438

## 2023-01-19 DIAGNOSIS — Z79899 Other long term (current) drug therapy: Secondary | ICD-10-CM | POA: Diagnosis not present

## 2023-01-21 ENCOUNTER — Telehealth: Payer: Self-pay

## 2023-01-21 NOTE — Telephone Encounter (Signed)
Transition Care Management Unsuccessful Follow-up Telephone Call  Date of discharge and from where:  [facility]  Attempts:  1st Attempt  Reason for unsuccessful TCM follow-up call:  Left voice message   Lenard Forth [facility] Guide, [facility] [phone] [address] [place], [place], [place] [address] Phone: [phone] Email: [email or web address]

## 2023-01-22 ENCOUNTER — Telehealth: Payer: Self-pay

## 2023-01-22 ENCOUNTER — Encounter: Payer: Self-pay | Admitting: Family Medicine

## 2023-01-22 ENCOUNTER — Ambulatory Visit (INDEPENDENT_AMBULATORY_CARE_PROVIDER_SITE_OTHER): Payer: Medicare Other | Admitting: Family Medicine

## 2023-01-22 VITALS — BP 111/72 | HR 87 | Ht 63.0 in | Wt 205.0 lb

## 2023-01-22 DIAGNOSIS — E7849 Other hyperlipidemia: Secondary | ICD-10-CM

## 2023-01-22 DIAGNOSIS — E890 Postprocedural hypothyroidism: Secondary | ICD-10-CM | POA: Diagnosis not present

## 2023-01-22 DIAGNOSIS — R142 Eructation: Secondary | ICD-10-CM

## 2023-01-22 DIAGNOSIS — R140 Abdominal distension (gaseous): Secondary | ICD-10-CM

## 2023-01-22 DIAGNOSIS — I10 Essential (primary) hypertension: Secondary | ICD-10-CM

## 2023-01-22 DIAGNOSIS — K219 Gastro-esophageal reflux disease without esophagitis: Secondary | ICD-10-CM

## 2023-01-22 DIAGNOSIS — Z3042 Encounter for surveillance of injectable contraceptive: Secondary | ICD-10-CM | POA: Diagnosis not present

## 2023-01-22 MED ORDER — MEDROXYPROGESTERONE ACETATE 150 MG/ML IM SUSP
150.0000 mg | Freq: Once | INTRAMUSCULAR | Status: AC
Start: 2023-01-22 — End: 2023-01-22
  Administered 2023-01-22: 150 mg via INTRAMUSCULAR

## 2023-01-22 NOTE — Telephone Encounter (Signed)
Transition Care Management Unsuccessful Follow-up Telephone Call  Date of discharge and from where:  [facility]  Attempts:  2nd Attempt  Reason for unsuccessful TCM follow-up call:  Left voice message   Lenard Forth [facility] Guide, [facility] [phone] [address] [place], [place], [place] [address] Phone: [phone] Email: [email or web address]

## 2023-01-22 NOTE — Patient Instructions (Addendum)
F/U in 12 weeks, call if you need me sooner Depo Provera at the visit  You need HPV vaccine #3, pls get at your pharmacy also TdAP  Labs today already ordered,   [place] of gall bladder is ordered and you are referred to GI   Depo provera todayYou are referred to Dr Fransico Him again important that you go  It is important that you exercise regularly at least 30 minutes 5 times a week. If you develop chest pain, have severe difficulty breathing, or feel very tired, stop exercising immediately and seek medical attention  Thanks for choosing [facility], we consider it a privelige to serve you.

## 2023-01-23 LAB — BMP8+EGFR
BUN/Creatinine Ratio: 8 — ABNORMAL LOW (ref 9–23)
BUN: 7 mg/dL (ref 6–24)
CO2: 23 mmol/L (ref 20–29)
Calcium: 9.3 mg/dL (ref 8.7–10.2)
Chloride: 101 mmol/L (ref 96–106)
Creatinine, Ser: 0.89 mg/dL (ref 0.57–1.00)
Glucose: 88 mg/dL (ref 70–99)
Potassium: 3.6 mmol/L (ref 3.5–5.2)
Sodium: 138 mmol/L (ref 134–144)
eGFR: 84 mL/min/{1.73_m2} (ref >59)

## 2023-01-23 LAB — LIPID PANEL
Chol/HDL Ratio: 4.4 ratio (ref 0.0–4.4)
Cholesterol, Total: 245 mg/dL — ABNORMAL HIGH (ref 100–199)
HDL: 56 mg/dL (ref >39)
LDL Chol Calc (NIH): 175 mg/dL — ABNORMAL HIGH (ref 0–99)
Triglycerides: 84 mg/dL (ref 0–149)
VLDL Cholesterol Cal: 14 mg/dL (ref 5–40)

## 2023-01-23 LAB — TSH: TSH: 3.69 u[IU]/mL (ref 0.450–4.500)

## 2023-01-23 MED ORDER — ROSUVASTATIN CALCIUM 20 MG PO TABS
20.0000 mg | ORAL_TABLET | Freq: Every day | ORAL | 5 refills | Status: DC
Start: 2023-01-23 — End: 2023-07-31

## 2023-01-23 NOTE — Assessment & Plan Note (Signed)
Hyperlipidemia:Low fat diet discussed and encouraged.   Lipid Panel  Lab Results  Component Value Date   CHOL 245 (H) 01/22/2023   HDL 56 01/22/2023   LDLCALC 175 (H) 01/22/2023   TRIG 84 01/22/2023   CHOLHDL 4.4 01/22/2023     Uncontrolled , needs to lower fat intake andstat crestor 20 mg daily

## 2023-01-23 NOTE — Assessment & Plan Note (Signed)
Excessive belching , bloating and burping, obtain US gB and refer GI

## 2023-01-23 NOTE — Assessment & Plan Note (Signed)
Depo povera 150 mg iM administeredat visit

## 2023-01-23 NOTE — Assessment & Plan Note (Signed)
Uncontrolled chronic symptoms of reflux despit high dose PPI refer GI

## 2023-01-23 NOTE — Progress Notes (Signed)
Martha Montgomery     MRN: [id number]      DOB: 1981-03-04  Chief Complaint  Patient presents with   Follow-up    Follow up seen in ER last week for stomach pain and swelling in ankles states since change in bp medication noticed swelling. Depo shot     HPI Martha Montgomery is here for follow up and re-evaluation of chronic medical conditions, medication management and review of any available recent lab and radiology data.  Preventive health is updated, specifically  Cancer screening and Immunization.   Questions or concerns regarding consultations or procedures which the PT has had in the interim are  addressed. The PT denies any adverse reactions to current medications since the last visit.  See CC  ROS Denies recent fever or chills. Denies sinus pressure, nasal congestion, ear pain or sore throat. Denies chest congestion, productive cough or wheezing. Denies chest pains, palpitations C/o excess bloating and burping also uncontrollled reflux symptoms, needs GI re evall   Denies dysuria, frequency, hesitancy or incontinence. Denies joint pain, swelling and limitation in mobility. Denies headaches, seizures, numbness, or tingling. Denies depression, anxiety or insomnia. Denies skin break down or rash.   PE  BP 111/72 (BP Location: Right Arm, Patient Position: Sitting, Cuff Size: Large)   Pulse 87   Ht 5\' 3"  (1.6 m)   Wt 205 lb 0.6 oz (93 kg)   SpO2 95%   BMI 36.32 kg/m   Patient alert and oriented and in no cardiopulmonary distress.  HEENT: No facial asymmetry, EOMI,     Neck supple .  Chest: Clear to auscultation bilaterally.  CVS: S1, S2 no murmurs, no S3.Regular rate.  .   Ext: trace  edema  MS: Adequate ROM spine, shoulders, hips and knees.  Skin: Intact, no ulcerations or rash noted.  Psych: Good eye contact, normal affect. Memory intact not anxious or depressed appearing.  CNS: CN 2-12 intact, power,  normal throughout.no focal deficits noted.   Assessment &  Plan  Hyperlipidemia Hyperlipidemia:Low fat diet discussed and encouraged.   Lipid Panel  Lab Results  Component Value Date   CHOL 245 (H) 01/22/2023   HDL 56 01/22/2023   LDLCALC 175 (H) 01/22/2023   TRIG 84 01/22/2023   CHOLHDL 4.4 01/22/2023     Uncontrolled , needs to lower fat intake andstat crestor 20 mg daily   Morbid obesity due to excess calories [facility])  Patient re-educated about  the importance of commitment to a  minimum of 150 minutes of exercise per week as able.  The importance of healthy food choices with portion control discussed, as well as eating regularly and within a 12 hour window most days. The need to choose "clean , green" food 50 to 75% of the time is discussed, as well as to make water the primary drink and set a goal of 64 ounces water daily.       01/22/2023    3:28 PM 01/14/2023   12:26 PM 11/24/2022    4:07 PM  Weight /BMI  Weight 205 lb 0.6 oz 204 lb 202 lb  Height 5\' 3"  (1.6 m) 5\' 3"  (1.6 m) 5\' 3"  (1.6 m)  BMI 36.32 kg/m2 36.14 kg/m2 35.78 kg/m2    Uchanged  Hypertension DASH diet and commitment to daily physical activity for a minimum of 30 minutes discussed and encouraged, as a part of hypertension management. The importance of attaining a healthy weight is also discussed.     01/22/2023  3:28 PM 01/14/2023   12:26 PM 11/24/2022    4:07 PM 10/30/2022    4:38 PM 10/30/2022    4:18 PM 08/07/2022    4:13 PM 06/12/2022    2:10 PM  BP/Weight  Systolic BP 111 117 122 124 124 116 143  Diastolic BP 72 71 81 84 86 78 91  Wt. (Lbs) 205.04 204 202  201.04 199.06 195  BMI 36.32 kg/m2 36.14 kg/m2 35.78 kg/m2  35.61 kg/m2 35.26 kg/m2 34.54 kg/m2     Controlled, no change in medication   Post-surgical hypothyroidism Currently controlled , should still have Endo follow up  Encounter for surveillance of injectable contraceptive Depo povera 150 mg iM administeredat visit  Bloating Excessive belching , bloating and burping, obtain US gB and  refer GI  GERD (gastroesophageal reflux disease) Uncontrolled chronic symptoms of reflux despit high dose PPI refer GI

## 2023-01-23 NOTE — Assessment & Plan Note (Signed)
Currently controlled , should still have Endo follow up

## 2023-01-23 NOTE — Assessment & Plan Note (Signed)
  Patient re-educated about  the importance of commitment to a  minimum of 150 minutes of exercise per week as able.  The importance of healthy food choices with portion control discussed, as well as eating regularly and within a 12 hour window most days. The need to choose "clean , green" food 50 to 75% of the time is discussed, as well as to make water the primary drink and set a goal of 64 ounces water daily.       01/22/2023    3:28 PM 01/14/2023   12:26 PM 11/24/2022    4:07 PM  Weight /BMI  Weight 205 lb 0.6 oz 204 lb 202 lb  Height 5\' 3"  (1.6 m) 5\' 3"  (1.6 m) 5\' 3"  (1.6 m)  BMI 36.32 kg/m2 36.14 kg/m2 35.78 kg/m2    Uchanged

## 2023-01-23 NOTE — Assessment & Plan Note (Signed)
DASH diet and commitment to daily physical activity for a minimum of 30 minutes discussed and encouraged, as a part of hypertension management. The importance of attaining a healthy weight is also discussed.     01/22/2023    3:28 PM 01/14/2023   12:26 PM 11/24/2022    4:07 PM 10/30/2022    4:38 PM 10/30/2022    4:18 PM 08/07/2022    4:13 PM 06/12/2022    2:10 PM  BP/Weight  Systolic BP 111 117 122 124 124 116 143  Diastolic BP 72 71 81 84 86 78 91  Wt. (Lbs) 205.04 204 202  201.04 199.06 195  BMI 36.32 kg/m2 36.14 kg/m2 35.78 kg/m2  35.61 kg/m2 35.26 kg/m2 34.54 kg/m2     Controlled, no change in medication

## 2023-01-26 NOTE — Progress Notes (Signed)
GI Office Note    Referring Provider: Kerri Perches, MD Primary Care Physician:  Kerri Perches, MD Primary Gastroenterologist: Hennie Duos. Marletta Lor, DO  Date:  02/04/2023  ID:  Martha Montgomery, DOB May 12, 1981, MRN [id number]   Chief Complaint   Chief Complaint  Patient presents with   Follow-up    Follow up on bloating, GERD, reflux, etc   History of Present Illness  Martha Montgomery is a 42 y.o. female with a history of IBS-C, rectal bleeding in setting, GERD, anxiety and depression, hypothyroidism, and HTN presenting today with complaint of bloating.   Last colonoscopy in March 2019 revealing significant looping of the colon, internal hemorrhoids, otherwise normal exam.  Recommended 10-year surveillance.   Office visit 04/17/22. Struggling with constipation having [place] 1-2 stools and lower abdominal pain that improves after defecation. Chronic intermittent rectal bleeding related to hemorrhoids. Denied rectal pain or anal intercourse. Miralax helpful but stated was expensive. Previously tried [place] which did not work well for her.  Reported incomplete bowel movements and occasional mucus.  Advised to try Linzess 290 mcg daily and follow up in a week for a progress report.  Last office visit 06/12/22. Recently started carvedilol.  Having issues with her thyroid.  GERD on medication since 2013.  Following with endocrinology.  GERD fairly well-controlled with pantoprazole 40 mg once daily.  Reflux is worse when she is constipated.  Unable to hold as this was helpful and was not taking regularly as she was not able to afford it.  Usually BM once per day but not anything on the way.  Experiences abdominal distention which affects her ability to urinate.  Did admit to eating lots of dairy products.  Not taking probiotic regularly.  Advised Linzess 290 mcg daily, pantoprazole 40 mg daily. Miralax daily. High fiber diet. Lactaid when consuming dairy products versus avoidance of dairy.   Advised Phazyme for gassiness  Patient seen picture of her stool in December with concerns for having a worm/parasite in her intestine.  Ova and parasite stool testing ordered but not completed.  Today: Constipation - better than previously. Had some issues about a month ago and went to the ED due to abdominal cramping. Almost felt like menstrual cramps (does not have periods). Tried to wait out the pain but pain lasted off and on for a week and a half. Since then though she has not had pain. Has been taking miralax and started taking black seed oil (helps reduce gas/bloating). Also taking a pre and probiotic (otc). Since she has started that she has had a daily BM. Still doesn't think that it all is coming out. When she feels the bloating she will exercise some and that helps. Seen Dr. Lodema Hong who has ordered an [place] to check her gallbladder to check the bloating. Has increased her water intake. Has not been taking Linzess lately due to cost. At times incorporates seaweed in her diet.   In the past she was on omeprazole and gas ex but states she was having adverse effects after a while. Wants to gear more toward natural and homeopathic regimens. Wants to ensure she is eating better foods. Has increased greens and veggies in her diet. Mint aggravates her reflux. Has avoided caffeine and carbonation as well as spicy foods and fried foods. Has incorporated dark cherries and that seems to help some with her constipation as well. Tries to also avoid saucy foods.   Has seen a mild increase in swelling in her  legs after change in her BP medication but has been trying to stay more active.    Current Outpatient Medications  Medication Sig Dispense Refill   Cholecalciferol (VITAMIN D3) 25 MCG (1000 UT) CAPS Take 1 capsule (1,000 Units total) by mouth daily. 90 capsule 1   diphenhydrAMINE (BENADRYL) 25 MG tablet Take 25 mg by mouth in the morning and at bedtime.     EPINEPHrine 0.3 mg/0.3 mL IJ SOAJ injection  Inject 0.3 mg into the muscle as needed for anaphylaxis. 1 each 0   fluticasone (FLONASE) 50 MCG/ACT nasal spray Place 2 sprays into both nostrils daily. 15.8 mL 1   levothyroxine (SYNTHROID) 200 MCG tablet TAKE 1 TABLET(200 MCG) BY MOUTH AT BEDTIME 90 tablet 1   medroxyPROGESTERone (DEPO-PROVERA) 150 MG/ML injection Inject 150 mg into the muscle every 3 (three) months.     morphine (MS CONTIN) 15 MG 12 hr tablet Take 15 mg by mouth 3 (three) times daily as needed.     Olmesartan-amLODIPine-HCTZ 40-10-25 MG TABS Take 1 tablet by mouth daily.     rosuvastatin (CRESTOR) 20 MG tablet Take 1 tablet (20 mg total) by mouth daily. 30 tablet 5   traMADol (ULTRAM) 50 MG tablet Take by mouth every 12 (twelve) hours as needed.     linaclotide (LINZESS) 290 MCG CAPS capsule Take 1 capsule (290 mcg total) by mouth daily before breakfast. (Patient not taking: Reported on 02/04/2023) 90 capsule 3   pantoprazole (PROTONIX) 40 MG tablet Take 1 tablet (40 mg total) by mouth 2 (two) times daily before a meal. 180 tablet 3   No current facility-administered medications for this visit.    Past Medical History:  Diagnosis Date   Anxiety    Anxiety disorder    Arthritis    "knees, joints, hands, toes" (09/28/2013)   Depression    Depression, major, single episode, in partial remission (HCC) 10/18/2008   Qualifier: Diagnosis of  By: Lodema Hong MD, Margaret  PHQ 9 score of 7 in 10/2017, not suicidal or homicidal, interested in telepsych however, score of 16 in  02/2018, not suicidal or homicidal, start medication and refer to telepsych Score of 7 in 07/2018    Fibromyalgia    Gastritis nov. 2011   EGD by Dr. Darrick Penna, negative h pylori   GERD (gastroesophageal reflux disease)    Gout    Hypertension x 6 years    Hypothyroidism    Migraines    "15/month average" (09/28/2013)   Obesity (BMI 30.0-34.9)    Spinal stenosis    Thyroid goiter     Past Surgical History:  Procedure Laterality Date   BIOPSY  10/20/2017    Procedure: BIOPSY;  Surgeon: West Bali, MD;  Location: [facility];  Service: Endoscopy;;  gastric duodenal esophagus   CESAREAN SECTION  2008   CHONDROPLASTY  04/02/2012   Procedure: CHONDROPLASTY;  Surgeon: Vickki Hearing, MD;  Location: [facility];  Service: Orthopedics;  Laterality: Left;  of left patella   COLONOSCOPY  11/2010   Dr. Darrick Penna: normal colon, small internal hemorrhoids   COLONOSCOPY WITH PROPOFOL N/A 10/22/2017   Surgeon: West Bali, MD; significant looping of the colon, internal hemorrhoids, otherwise normal exam.  Recommended 10-year surveillance.   COLONOSCOPY WITH PROPOFOL N/A 10/20/2017   Procedure: COLONOSCOPY WITH PROPOFOL;  Surgeon: West Bali, MD;  Location: [facility];  Service: Endoscopy;  Laterality: N/A;  11:00am   ESOPHAGOGASTRODUODENOSCOPY (EGD) WITH PROPOFOL N/A 10/20/2017   Surgeon: West Bali,  MD; no endoscopic abnormality to explain dysphagia s/p empiric dilation, gastritis.  Esophageal biopsies consistent with reflux, gastric biopsies with gastric antral and oxyntic mucosa negative for H. pylori, duodenal biopsies benign.   INCISION AND DRAINAGE ABSCESS  1997; 2005   "under right buttocks; under left arm"   SAVORY DILATION N/A 10/20/2017   Procedure: SAVORY DILATION;  Surgeon: West Bali, MD;  Location: [facility];  Service: Endoscopy;  Laterality: N/A;   THYROIDECTOMY N/A 09/28/2013   Procedure: TOTAL THYROIDECTOMY;  Surgeon: Darletta Moll, MD;  Location: [facility];  Service: ENT;  Laterality: N/A;   TOTAL THYROIDECTOMY  09/27/2013   UPPER GI ENDOSCOPY  2011   EGD: gastritis, no h.pylori    Family History  Problem Relation Age of Onset   Arthritis Mother    Migraines Mother    Hypertension Mother    Cancer Maternal Grandmother        lung   Asthma Other        family history    Colon cancer Neg Hx     Allergies as of 02/04/2023 - Review Complete 02/04/2023  Allergen Reaction Noted   Aspirin  05/23/2020   Codeine  Itching    Diclofenac  07/16/2021   Ibuprofen Other (See Comments) 09/02/2016   Metronidazole  07/16/2021   Nsaids  07/16/2021   Soybean-containing drug products Other (See Comments) 12/10/2010    Social History   Socioeconomic History   Marital status: Legally Separated    Spouse name: Not on file   Number of children: 1   Years of education: 12   Highest education level: 12th grade  Occupational History   Occupation: Banker of Human resources officer: UNEMPLOYED  Tobacco Use   Smoking status: Former    Packs/day: 1.00    Years: 5.00    Additional pack years: 0.00    Total pack years: 5.00    Types: Cigarettes    Quit date: 09/11/2010    Years since quitting: 12.4   Smokeless tobacco: Never   Tobacco comments:    QUIT SMOKING X 3 YEARS AGO,was vaping but has since quit completely  Vaping Use   Vaping Use: Every day  Substance and Sexual Activity   Alcohol use: No   Drug use: No   Sexual activity: Not Currently    Partners: Male    Birth control/protection: Other-see comments, Injection    Comment: Depo  Other Topics Concern   Not on file  Social History Narrative   Pt is currently on medicaid. From [place] and came to [place] in mid 2010   Is separated from Husband, lives with Mother right now    Social Determinants of Health   Financial Resource Strain: Medium Risk (06/22/2017)   Overall Financial Resource Strain (CARDIA)    Difficulty of Paying Living Expenses: Somewhat hard  Food Insecurity: Food Insecurity Present (08/30/2020)   Hunger Vital Sign    Worried About Running Out of Food in the Last Year: Sometimes true    Ran Out of Food in the Last Year: Sometimes true  Transportation Needs: No Transportation Needs (08/30/2020)   PRAPARE - Administrator, Civil Service (Medical): No    Lack of Transportation (Non-Medical): No  Physical Activity: Sufficiently Active (08/30/2020)   Exercise Vital Sign    Days of Exercise per  Week: 6 days    Minutes of Exercise per Session: 80 min  Stress: No Stress Concern Present (08/12/2018)  [employer] of Occupational Health - Occupational Stress Questionnaire    Feeling of Stress : Only a little  Social Connections: Moderately Isolated (08/30/2020)   Social Connection and Isolation Panel [NHANES]    Frequency of Communication with Friends and Family: More than three times a week    Frequency of Social Gatherings with Friends and Family: More than three times a week    Attends Religious Services: More than 4 times per year    Active Member of [employer] or Organizations: No    Attends Banker Meetings: Never    Marital Status: Separated     Review of Systems   Gen: Denies fever, chills, anorexia. Denies fatigue, weakness, weight loss.  CV: Denies chest pain, palpitations, syncope, peripheral edema, and claudication. Resp: Denies dyspnea at rest, cough, wheezing, coughing up blood, and pleurisy. GI: See HPI Derm: Denies rash, itching, dry skin Psych: Denies depression, anxiety, memory loss, confusion. No homicidal or suicidal ideation.  Heme: Denies bruising, bleeding, and enlarged lymph nodes.  Physical Exam   BP (!) 142/98   Pulse (!) 106   Temp 97.7 F (36.5 C)   Ht 5\' 3"  (1.6 m)   Wt 209 lb (94.8 kg)   BMI 37.02 kg/m   General:   Alert and oriented. No distress noted. Pleasant and cooperative.  Head:  Normocephalic and atraumatic. Eyes:  Conjuctiva clear without scleral icterus. Mouth:  Oral mucosa pink and moist. Good dentition. No lesions. Lungs:  Clear to auscultation bilaterally. No wheezes, rales, or rhonchi. No distress.  Heart:  S1, S2 present without murmurs appreciated.  Abdomen:  +BS, soft, non-tender and non-distended. No rebound or guarding. No HSM or masses noted. Rectal: deferred Msk:  Symmetrical without gross deformities. Normal posture. Extremities:  Without edema. Neurologic:  Alert and  oriented x4 Psych:  Alert and  cooperative. Normal mood and affect.   Assessment  Martha Montgomery is a 42 y.o. female with a history of IBS-C, rectal bleeding in setting, GERD, anxiety and depression, hypothyroidism, and HTN presenting today with complaint of bloating.    GERD: Fairly well-controlled on pantoprazole 40 mg 1-2 times daily.  Has been working on dietary changes and homeopathic regimens seem to be working well for her currently.   IBS-C, bloating: Chronic. Has tried and failed Amitiza previously.  Still having some issues with bloating but over the last week she has been using black seed oil in her MiraLAX she has seen a reduction in bloating as well as some better bowel habits.  She also has been taking a pre and probiotic.  Is not taking Linzess currently given issues with cost.  She has reported some mild upper abdominal pain that felt like menstrual cramps a few weeks ago and PCP is ordering ultrasound.  Discussed that if any of her over-the-counter regimens do not seem to help her bloating that we may try different probiotic such as visibiome.  Continue to discuss need for high-fiber diet which she is incorporating.   PLAN   Continue probiotics and natural methods and well balanced diet Continue miralax daily Pantoprazole 40 mg 1-2 times daily. Refilled.  High fiber diet May try Visbiome in the future if bloating continues. Follow up 6 months    Brooke Bonito, MSN, FNP-BC, AGACNP-BC [facility]

## 2023-02-04 ENCOUNTER — Encounter: Payer: Self-pay | Admitting: Gastroenterology

## 2023-02-04 ENCOUNTER — Ambulatory Visit: Payer: Medicare Other | Admitting: Gastroenterology

## 2023-02-04 VITALS — BP 142/98 | HR 106 | Temp 97.7°F | Ht 63.0 in | Wt 209.0 lb

## 2023-02-04 DIAGNOSIS — K581 Irritable bowel syndrome with constipation: Secondary | ICD-10-CM

## 2023-02-04 DIAGNOSIS — R140 Abdominal distension (gaseous): Secondary | ICD-10-CM | POA: Diagnosis not present

## 2023-02-04 DIAGNOSIS — K219 Gastro-esophageal reflux disease without esophagitis: Secondary | ICD-10-CM

## 2023-02-04 MED ORDER — PANTOPRAZOLE SODIUM 40 MG PO TBEC
40.0000 mg | DELAYED_RELEASE_TABLET | Freq: Two times a day (BID) | ORAL | 3 refills | Status: AC
Start: 2023-02-04 — End: ?

## 2023-02-04 NOTE — Patient Instructions (Addendum)
Continue with your recent dietary changes as well as your homeopathic regimens given they are providing you some relief of symptoms.  Continue MiraLAX daily, if your constipation worsens you can increase your miralax to twice daily if you would like.  A more natural regimen would also be to drink a little bit of something called smooth move tea.  I refilled your pantoprazole for you today.  Continue taking this once to twice daily.   We will follow-up in 6 months, sooner if needed.  Please let her know her bloating gets worse and I can give you some samples ascending colon Visbiome which is a different type of probiotic  It was a pleasure to see you today. I want to create trusting relationships with patients. If you receive a survey regarding your visit,  I greatly appreciate you taking time to fill this out on paper or through your MyChart. I value your feedback.  Brooke Bonito, MSN, FNP-BC, AGACNP-BC [facility]

## 2023-02-05 ENCOUNTER — Ambulatory Visit (HOSPITAL_COMMUNITY)
Admission: RE | Admit: 2023-02-05 | Discharge: 2023-02-05 | Disposition: A | Discharge status: Home / Self Care | Payer: Medicare Other | Source: Ambulatory Visit | Attending: Family Medicine | Admitting: Family Medicine

## 2023-02-05 DIAGNOSIS — R140 Abdominal distension (gaseous): Secondary | ICD-10-CM | POA: Insufficient documentation

## 2023-02-05 DIAGNOSIS — R142 Eructation: Secondary | ICD-10-CM | POA: Insufficient documentation

## 2023-02-10 DIAGNOSIS — Z79899 Other long term (current) drug therapy: Secondary | ICD-10-CM | POA: Diagnosis not present

## 2023-02-10 DIAGNOSIS — E7800 Pure hypercholesterolemia, unspecified: Secondary | ICD-10-CM | POA: Diagnosis not present

## 2023-02-10 DIAGNOSIS — I10 Essential (primary) hypertension: Secondary | ICD-10-CM | POA: Diagnosis not present

## 2023-02-10 DIAGNOSIS — E559 Vitamin D deficiency, unspecified: Secondary | ICD-10-CM | POA: Diagnosis not present

## 2023-02-10 DIAGNOSIS — G8929 Other chronic pain: Secondary | ICD-10-CM | POA: Diagnosis not present

## 2023-02-10 DIAGNOSIS — R7303 Prediabetes: Secondary | ICD-10-CM | POA: Diagnosis not present

## 2023-02-10 DIAGNOSIS — M25561 Pain in right knee: Secondary | ICD-10-CM | POA: Diagnosis not present

## 2023-02-10 DIAGNOSIS — M25562 Pain in left knee: Secondary | ICD-10-CM | POA: Diagnosis not present

## 2023-02-10 DIAGNOSIS — M47812 Spondylosis without myelopathy or radiculopathy, cervical region: Secondary | ICD-10-CM | POA: Diagnosis not present

## 2023-02-10 DIAGNOSIS — M1712 Unilateral primary osteoarthritis, left knee: Secondary | ICD-10-CM | POA: Diagnosis not present

## 2023-02-10 DIAGNOSIS — M25571 Pain in right ankle and joints of right foot: Secondary | ICD-10-CM | POA: Diagnosis not present

## 2023-03-12 DIAGNOSIS — M1712 Unilateral primary osteoarthritis, left knee: Secondary | ICD-10-CM | POA: Diagnosis not present

## 2023-03-12 DIAGNOSIS — G8929 Other chronic pain: Secondary | ICD-10-CM | POA: Diagnosis not present

## 2023-03-12 DIAGNOSIS — R7303 Prediabetes: Secondary | ICD-10-CM | POA: Diagnosis not present

## 2023-03-12 DIAGNOSIS — M25561 Pain in right knee: Secondary | ICD-10-CM | POA: Diagnosis not present

## 2023-03-12 DIAGNOSIS — I10 Essential (primary) hypertension: Secondary | ICD-10-CM | POA: Diagnosis not present

## 2023-03-12 DIAGNOSIS — M47812 Spondylosis without myelopathy or radiculopathy, cervical region: Secondary | ICD-10-CM | POA: Diagnosis not present

## 2023-03-12 DIAGNOSIS — M25562 Pain in left knee: Secondary | ICD-10-CM | POA: Diagnosis not present

## 2023-03-12 DIAGNOSIS — Z79899 Other long term (current) drug therapy: Secondary | ICD-10-CM | POA: Diagnosis not present

## 2023-03-12 DIAGNOSIS — E559 Vitamin D deficiency, unspecified: Secondary | ICD-10-CM | POA: Diagnosis not present

## 2023-03-16 ENCOUNTER — Ambulatory Visit (HOSPITAL_BASED_OUTPATIENT_CLINIC_OR_DEPARTMENT_OTHER): Payer: Medicare Other | Admitting: Orthopaedic Surgery

## 2023-03-26 ENCOUNTER — Emergency Department (HOSPITAL_COMMUNITY): Payer: Medicare Other

## 2023-03-26 ENCOUNTER — Other Ambulatory Visit: Payer: Self-pay

## 2023-03-26 ENCOUNTER — Emergency Department (HOSPITAL_COMMUNITY)
Admission: EM | Admit: 2023-03-26 | Discharge: 2023-03-26 | Disposition: A | Discharge status: Home / Self Care | Payer: Medicare Other | Attending: Emergency Medicine | Admitting: Emergency Medicine

## 2023-03-26 DIAGNOSIS — M25562 Pain in left knee: Secondary | ICD-10-CM | POA: Diagnosis not present

## 2023-03-26 DIAGNOSIS — N3289 Other specified disorders of bladder: Secondary | ICD-10-CM | POA: Diagnosis not present

## 2023-03-26 DIAGNOSIS — M25561 Pain in right knee: Secondary | ICD-10-CM | POA: Diagnosis not present

## 2023-03-26 DIAGNOSIS — R1032 Left lower quadrant pain: Secondary | ICD-10-CM | POA: Insufficient documentation

## 2023-03-26 DIAGNOSIS — S83412A Sprain of medial collateral ligament of left knee, initial encounter: Secondary | ICD-10-CM | POA: Diagnosis not present

## 2023-03-26 DIAGNOSIS — R109 Unspecified abdominal pain: Secondary | ICD-10-CM | POA: Diagnosis not present

## 2023-03-26 DIAGNOSIS — R000 Tachycardia, unspecified: Secondary | ICD-10-CM | POA: Diagnosis not present

## 2023-03-26 LAB — HCG, QUANTITATIVE, PREGNANCY: hCG, Beta Chain, Quant, S: <1 m[IU]/mL (ref <5)

## 2023-03-26 LAB — I-STAT CHEM 8, ED
BUN: 13 mg/dL (ref 6–20)
Calcium, Ion: 1.16 mmol/L (ref 1.15–1.40)
Chloride: 106 mmol/L (ref 98–111)
Creatinine, Ser: 0.9 mg/dL (ref 0.44–1.00)
Glucose, Bld: 89 mg/dL (ref 70–99)
HCT: 37 % (ref 36.0–46.0)
Hemoglobin: 12.6 g/dL (ref 12.0–15.0)
Potassium: 3.4 mmol/L — ABNORMAL LOW (ref 3.5–5.1)
Sodium: 141 mmol/L (ref 135–145)
TCO2: 23 mmol/L (ref 22–32)

## 2023-03-26 MED ORDER — OXYCODONE HCL 5 MG PO TABS
5.0000 mg | ORAL_TABLET | Freq: Once | ORAL | Status: AC
  Administered 2023-03-26: 5 mg via ORAL
  Filled 2023-03-26: qty 1

## 2023-03-26 MED ORDER — METHOCARBAMOL 500 MG PO TABS: 500.0000 mg | ORAL_TABLET | Freq: Two times a day (BID) | ORAL | 0 refills | Status: DC

## 2023-03-26 MED ORDER — METHOCARBAMOL 500 MG PO TABS
500.0000 mg | ORAL_TABLET | Freq: Once | ORAL | Status: DC
  Filled 2023-03-26: qty 1

## 2023-03-26 MED ORDER — IOHEXOL 350 MG/ML SOLN
75.0000 mL | Freq: Once | INTRAVENOUS | Status: AC | PRN
  Administered 2023-03-26: 75 mL via INTRAVENOUS

## 2023-03-26 MED ORDER — ACETAMINOPHEN 500 MG PO TABS
1000.0000 mg | ORAL_TABLET | Freq: Once | ORAL | Status: DC
  Filled 2023-03-26: qty 2
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
## 2023-03-26 NOTE — Discharge Instructions (Addendum)
Overall CT scan the abdomen pelvis is unremarkable.  No injuries.  Left knee with may be small avulsion injury or fracture.  I suspect there is a knee contusion or sprain as we discussed.  Right now I will have you wear a knee immobilizer or crutches with minimal weightbearing and follow-up with orthopedics as scheduled.  Recommend ice, Tylenol and ibuprofen if you have no contraindications to take these medications.  I have written you for Robaxin for muscle relaxant as you do have some allergies but this medication should be safe for you to take.  This medication is sedating so please do not mix with alcohol or drugs or other dangerous activities.  Follow-up with your orthopedic doctor.

## 2023-03-26 NOTE — ED Notes (Signed)
Patient transported to X-ray 

## 2023-04-01 ENCOUNTER — Telehealth: Payer: Self-pay | Admitting: *Deleted

## 2023-04-01 NOTE — Progress Notes (Unsigned)
  Care Coordination  Outreach Note  04/01/2023 Name: Martha Montgomery MRN: [id number] DOB: 28-Apr-1981   Care Coordination Outreach Attempts: An unsuccessful telephone outreach was attempted today to offer the patient information about available care coordination services.  Follow Up Plan:  Additional outreach attempts will be made to offer the patient care coordination information and services.   Encounter Outcome:  No Answer  Gwenevere Ghazi  Care Coordination Care Guide  Direct Dial: [phone]

## 2023-04-02 NOTE — Progress Notes (Signed)
  Care Coordination   Note   04/02/2023 Name: Martha Montgomery MRN: [id number] DOB: 10/15/1980  Martha Montgomery is a 42 y.o. year old female who sees Kerri Perches, MD for primary care. I reached out to Ihor Dow by phone today to offer care coordination services.  Ms. Bullard was given information about Care Coordination services today including:   The Care Coordination services include support from the care team which includes your Nurse Coordinator, Clinical Social Worker, or Pharmacist.  The Care Coordination team is here to help remove barriers to the health concerns and goals most important to you. Care Coordination services are voluntary, and the patient may decline or stop services at any time by request to their care team member.   Care Coordination Consent Status: Patient agreed to services and verbal consent obtained.   Follow up plan:  Telephone appointment with care coordination team member scheduled for:  04/07/23  Encounter Outcome:  Patient Scheduled [facility] Coordination Care Guide  Direct Dial: [phone]

## 2023-04-03 ENCOUNTER — Ambulatory Visit (HOSPITAL_BASED_OUTPATIENT_CLINIC_OR_DEPARTMENT_OTHER): Payer: Medicare Other | Admitting: Orthopaedic Surgery

## 2023-04-03 ENCOUNTER — Ambulatory Visit (HOSPITAL_BASED_OUTPATIENT_CLINIC_OR_DEPARTMENT_OTHER): Payer: Medicare Other

## 2023-04-03 DIAGNOSIS — M2391 Unspecified internal derangement of right knee: Secondary | ICD-10-CM | POA: Diagnosis not present

## 2023-04-03 DIAGNOSIS — M25361 Other instability, right knee: Secondary | ICD-10-CM | POA: Diagnosis not present

## 2023-04-03 DIAGNOSIS — M25362 Other instability, left knee: Secondary | ICD-10-CM | POA: Diagnosis not present

## 2023-04-03 DIAGNOSIS — M2390 Unspecified internal derangement of unspecified knee: Secondary | ICD-10-CM | POA: Diagnosis not present

## 2023-04-03 DIAGNOSIS — M25561 Pain in right knee: Secondary | ICD-10-CM | POA: Diagnosis not present

## 2023-04-03 DIAGNOSIS — M2392 Unspecified internal derangement of left knee: Secondary | ICD-10-CM

## 2023-04-03 DIAGNOSIS — M25562 Pain in left knee: Secondary | ICD-10-CM | POA: Diagnosis not present
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
## 2023-04-06 ENCOUNTER — Ambulatory Visit (HOSPITAL_COMMUNITY)
Admission: RE | Admit: 2023-04-06 | Discharge: 2023-04-06 | Disposition: A | Discharge status: Home / Self Care | Payer: Medicare Other | Source: Ambulatory Visit | Attending: Orthopaedic Surgery | Admitting: Orthopaedic Surgery

## 2023-04-06 ENCOUNTER — Ambulatory Visit (HOSPITAL_COMMUNITY)
Admission: RE | Admit: 2023-04-06 | Discharge: 2023-04-06 | Disposition: A | Discharge status: Home / Self Care | Payer: Medicare Other | Source: Ambulatory Visit | Attending: Orthopaedic Surgery

## 2023-04-06 DIAGNOSIS — M2390 Unspecified internal derangement of unspecified knee: Secondary | ICD-10-CM

## 2023-04-06 DIAGNOSIS — S8991XA Unspecified injury of right lower leg, initial encounter: Secondary | ICD-10-CM | POA: Diagnosis not present

## 2023-04-06 DIAGNOSIS — M25562 Pain in left knee: Secondary | ICD-10-CM | POA: Diagnosis not present

## 2023-04-06 DIAGNOSIS — M7122 Synovial cyst of popliteal space [Baker], left knee: Secondary | ICD-10-CM | POA: Diagnosis not present

## 2023-04-06 DIAGNOSIS — R609 Edema, unspecified: Secondary | ICD-10-CM | POA: Diagnosis not present

## 2023-04-07 ENCOUNTER — Ambulatory Visit: Payer: Self-pay | Admitting: Licensed Clinical Social Worker
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
## 2023-04-08 ENCOUNTER — Telehealth: Payer: Self-pay

## 2023-04-08 NOTE — Telephone Encounter (Signed)
Transition Care Management Follow-up Telephone Call Date of discharge and from where: 03/26/2023 [facility] How have you been since you were released from the hospital? Patient stated she is feeling better. The pain medication is helping. Any questions or concerns? No  Items Reviewed: Did the pt receive and understand the discharge instructions provided? Yes  Medications obtained and verified? Yes  Other? No  Any new allergies since your discharge? No  Dietary orders reviewed? Yes Do you have support at home? Yes   Follow up appointments reviewed:  PCP Hospital f/u appt confirmed? No  Scheduled to see  on  @ . Specialist Hospital f/u appt confirmed? Yes  Scheduled to see Martha Cote, MD on 04/03/2023 @ [facility]. Are transportation arrangements needed? No  If their condition worsens, is the pt aware to call PCP or go to the Emergency Dept.? Yes Was the patient provided with contact information for the PCP's office or ED? Yes Was to pt encouraged to call back with questions or concerns? Yes  Martha Montgomery [facility]  [facility], [facility] Guide Direct Dial: [phone]  Website: [email or web address]

## 2023-04-10 DIAGNOSIS — I10 Essential (primary) hypertension: Secondary | ICD-10-CM | POA: Diagnosis not present

## 2023-04-10 DIAGNOSIS — E559 Vitamin D deficiency, unspecified: Secondary | ICD-10-CM | POA: Diagnosis not present

## 2023-04-10 DIAGNOSIS — M1712 Unilateral primary osteoarthritis, left knee: Secondary | ICD-10-CM | POA: Diagnosis not present

## 2023-04-10 DIAGNOSIS — M47812 Spondylosis without myelopathy or radiculopathy, cervical region: Secondary | ICD-10-CM | POA: Diagnosis not present

## 2023-04-10 DIAGNOSIS — G8929 Other chronic pain: Secondary | ICD-10-CM | POA: Diagnosis not present

## 2023-04-10 DIAGNOSIS — R7303 Prediabetes: Secondary | ICD-10-CM | POA: Diagnosis not present

## 2023-04-10 DIAGNOSIS — Z79899 Other long term (current) drug therapy: Secondary | ICD-10-CM | POA: Diagnosis not present

## 2023-04-10 DIAGNOSIS — M25561 Pain in right knee: Secondary | ICD-10-CM | POA: Diagnosis not present

## 2023-04-10 DIAGNOSIS — M25562 Pain in left knee: Secondary | ICD-10-CM | POA: Diagnosis not present

## 2023-04-16 ENCOUNTER — Encounter: Payer: Self-pay | Admitting: Family Medicine

## 2023-04-16 ENCOUNTER — Ambulatory Visit (INDEPENDENT_AMBULATORY_CARE_PROVIDER_SITE_OTHER): Payer: Medicare Other | Admitting: Family Medicine

## 2023-04-16 VITALS — BP 132/81 | HR 92 | Ht 63.0 in | Wt 206.1 lb

## 2023-04-16 DIAGNOSIS — Z23 Encounter for immunization: Secondary | ICD-10-CM | POA: Diagnosis not present

## 2023-04-16 DIAGNOSIS — E890 Postprocedural hypothyroidism: Secondary | ICD-10-CM | POA: Diagnosis not present

## 2023-04-16 DIAGNOSIS — Z79899 Other long term (current) drug therapy: Secondary | ICD-10-CM | POA: Diagnosis not present

## 2023-04-16 DIAGNOSIS — M25562 Pain in left knee: Secondary | ICD-10-CM

## 2023-04-16 DIAGNOSIS — G8929 Other chronic pain: Secondary | ICD-10-CM

## 2023-04-16 DIAGNOSIS — F320 Major depressive disorder, single episode, mild: Secondary | ICD-10-CM

## 2023-04-16 DIAGNOSIS — M25561 Pain in right knee: Secondary | ICD-10-CM

## 2023-04-16 DIAGNOSIS — I10 Essential (primary) hypertension: Secondary | ICD-10-CM | POA: Diagnosis not present

## 2023-04-16 DIAGNOSIS — G43709 Chronic migraine without aura, not intractable, without status migrainosus: Secondary | ICD-10-CM | POA: Diagnosis not present

## 2023-04-16 DIAGNOSIS — F411 Generalized anxiety disorder: Secondary | ICD-10-CM

## 2023-04-16 DIAGNOSIS — M2550 Pain in unspecified joint: Secondary | ICD-10-CM | POA: Diagnosis not present

## 2023-04-16 DIAGNOSIS — F32A Depression, unspecified: Secondary | ICD-10-CM

## 2023-04-16 DIAGNOSIS — E669 Obesity, unspecified: Secondary | ICD-10-CM

## 2023-04-16 DIAGNOSIS — N3289 Other specified disorders of bladder: Secondary | ICD-10-CM

## 2023-04-16 DIAGNOSIS — H547 Unspecified visual loss: Secondary | ICD-10-CM

## 2023-04-16 DIAGNOSIS — E7849 Other hyperlipidemia: Secondary | ICD-10-CM

## 2023-04-16 MED ORDER — ESCITALOPRAM OXALATE 10 MG PO TABS: 10.0000 mg | ORAL_TABLET | Freq: Every day | ORAL | 3 refills | Status: DC

## 2023-04-16 NOTE — Patient Instructions (Signed)
F/U in 6 to 8  weeks re evaluate depression and anxiety  Depo provera 9/27 afternoon, nurse visit  You are referred to Dr Charise Killian for eye exam new for depression and anxiety is citalopram and you are referred  for therapy     hBA1C, fasting lipid, cmp and eGFr and tSH 5 days before next appt   Flu vaccine today  It is important that you exercise regularly at least 30 minutes 5 times a week. If you develop chest pain, have severe difficulty breathing, or feel very tired, stop exercising immediately and seek medical attention

## 2023-04-20 ENCOUNTER — Encounter (HOSPITAL_BASED_OUTPATIENT_CLINIC_OR_DEPARTMENT_OTHER): Payer: Self-pay

## 2023-04-20 ENCOUNTER — Encounter: Payer: Self-pay | Admitting: Family Medicine

## 2023-04-20 DIAGNOSIS — F411 Generalized anxiety disorder: Secondary | ICD-10-CM | POA: Insufficient documentation

## 2023-04-20 DIAGNOSIS — F320 Major depressive disorder, single episode, mild: Secondary | ICD-10-CM | POA: Insufficient documentation

## 2023-04-20 DIAGNOSIS — N3289 Other specified disorders of bladder: Secondary | ICD-10-CM | POA: Insufficient documentation

## 2023-04-20 DIAGNOSIS — H547 Unspecified visual loss: Secondary | ICD-10-CM | POA: Insufficient documentation

## 2023-04-20 DIAGNOSIS — F32A Depression, unspecified: Secondary | ICD-10-CM | POA: Insufficient documentation

## 2023-04-20 NOTE — Assessment & Plan Note (Signed)
Medication started and refer to therapist, re eval in 8 weeks

## 2023-04-20 NOTE — Assessment & Plan Note (Signed)
Hyperlipidemia:Low fat diet discussed and encouraged.   Lipid Panel  Lab Results  Component Value Date   CHOL 245 (H) 01/22/2023   HDL 56 01/22/2023   LDLCALC 175 (H) 01/22/2023   TRIG 84 01/22/2023   CHOLHDL 4.4 01/22/2023     Updated lab needed at/ before next visit. Needs to lower fat intake

## 2023-04-20 NOTE — Assessment & Plan Note (Signed)
Reports surgery is recommended

## 2023-04-20 NOTE — Progress Notes (Signed)
Martha Montgomery     MRN: [id number]      DOB: [birth year]  Chief Complaint  Patient presents with   Follow-up    Follow up stress at home, facing surgeries, abdominal pain everyday bladder distended     HPI Martha Montgomery is here for follow up and re-evaluation of chronic medical conditions, medication management and review of any available recent lab and radiology data.  Preventive health is updated, specifically  Cancer screening and Immunization.   Questions or concerns regarding consultations or procedures which the PT has had in the interim are  addressed. The PT denies any adverse reactions to current medications since the last visit.  Concerns as above  Patient and/or legal guardian verbally consented to [facility] services about presenting concerns and psychiatric consultation as appropriate.  The services will be billed as appropriate for the patient   ROS Denies recent fever or chills. Denies sinus pressure, nasal congestion, ear pain or sore throat. Denies chest congestion, productive cough or wheezing. Denies chest pains, palpitations and leg swelling Denies abdominal pain, nausea, vomiting,diarrhea or constipation.   . Chronic and increased  joint pain, swelling and limitation in mobility.States knee surgery is recommended Denies headaches, seizures, numbness, or tingling.  Denies skin break down or rash.   PE  BP 132/81 (BP Location: Right Arm, Patient Position: Sitting, Cuff Size: Large)   Pulse 92   Ht 5\' 3"  (1.6 m)   Wt 206 lb 1.3 oz (93.5 kg)   SpO2 96%   BMI 36.51 kg/m   Patient alert and oriented and in no cardiopulmonary distress.  HEENT: No facial asymmetry, EOMI,     Neck supple .  Chest: Clear to auscultation bilaterally.  CVS: S1, S2 no murmurs, no S3.Regular rate.  ABD: Soft non tender.   Ext: No edema  MS: Adequate ROM spine, shoulders, hips and knees.  Skin: Intact, no ulcerations or rash noted.  Psych: Good eye  contact, normal affect. Memory intact not anxious or depressed appearing.  CNS: CN 2-12 intact, power,  normal throughout.no focal deficits noted.   Assessment & Plan  GAD (generalized anxiety disorder) Medication started and refer to therapist, re eval in 8 weeks  Depression, major, single episode, mild (HCC) Primarily triggered by significant health challenges in close members of her family, self, son, mother, cousin who she lives with , will benefit from therapy and agrees, will refer, med also started  Post-surgical hypothyroidism Updated lab needed at/ before next visit.   Bilateral knee pain Reports surgery is recommended  Hypertension DASH diet and commitment to daily physical activity for a minimum of 30 minutes discussed and encouraged, as a part of hypertension management. The importance of attaining a healthy weight is also discussed.     04/16/2023    4:13 PM 03/26/2023    5:52 PM 03/26/2023    2:06 PM 03/26/2023    2:05 PM 02/04/2023    8:12 AM 01/22/2023    3:28 PM 01/14/2023   12:26 PM  BP/Weight  Systolic BP 132 124  112 142 111 117  Diastolic BP 81 83  86 98 72 71  Wt. (Lbs) 206.08  180  209 205.04 204  BMI 36.51 kg/m2  31.89 kg/m2  37.02 kg/m2 36.32 kg/m2 36.14 kg/m2     Controlled, no change in medication   Hyperlipidemia Hyperlipidemia:Low fat diet discussed and encouraged.   Lipid Panel  Lab Results  Component Value Date   CHOL 245 (H) 01/22/2023  HDL 56 01/22/2023   LDLCALC 175 (H) 01/22/2023   TRIG 84 01/22/2023   CHOLHDL 4.4 01/22/2023     Updated lab needed at/ before next visit. Needs to lower fat intake  Morbid obesity due to excess calories [facility])  Patient re-educated about  the importance of commitment to a  minimum of 150 minutes of exercise per week as able.  The importance of healthy food choices with portion control discussed, as well as eating regularly and within a 12 hour window most days. The need to choose "clean ,  green" food 50 to 75% of the time is discussed, as well as to make water the primary drink and set a goal of 64 ounces water daily.       04/16/2023    4:13 PM 03/26/2023    2:06 PM 02/04/2023    8:12 AM  Weight /BMI  Weight 206 lb 1.3 oz 180 lb 209 lb  Height 5\' 3"  (1.6 m) 5\' 3"  (1.6 m) 5\' 3"  (1.6 m)  BMI 36.51 kg/m2 31.89 kg/m2 37.02 kg/m2      Generalized joint pain Chronic pain management through pain clinic  Migraine headache Managed through pain clinic  Decreased vision Requests referral for vision evaluation refer to dr Charise Killian  Bladder distension Feeling of fullness in bladder , will reach out to see if wants urology eval at this time, recent scan pelvis 02/2023 showed no abnormality

## 2023-04-20 NOTE — Assessment & Plan Note (Signed)
Chronic pain management through pain clinic ?

## 2023-04-20 NOTE — Assessment & Plan Note (Signed)
Feeling of fullness in bladder , will reach out to see if wants urology eval at this time, recent scan pelvis 02/2023 showed no abnormality

## 2023-04-20 NOTE — Assessment & Plan Note (Signed)
Managed through pain clinic

## 2023-04-20 NOTE — Assessment & Plan Note (Signed)
Updated lab needed at/ before next visit.   

## 2023-04-20 NOTE — Assessment & Plan Note (Signed)
  Patient re-educated about  the importance of commitment to a  minimum of 150 minutes of exercise per week as able.  The importance of healthy food choices with portion control discussed, as well as eating regularly and within a 12 hour window most days. The need to choose "clean , green" food 50 to 75% of the time is discussed, as well as to make water the primary drink and set a goal of 64 ounces water daily.       04/16/2023    4:13 PM 03/26/2023    2:06 PM 02/04/2023    8:12 AM  Weight /BMI  Weight 206 lb 1.3 oz 180 lb 209 lb  Height 5\' 3"  (1.6 m) 5\' 3"  (1.6 m) 5\' 3"  (1.6 m)  BMI 36.51 kg/m2 31.89 kg/m2 37.02 kg/m2

## 2023-04-20 NOTE — Assessment & Plan Note (Signed)
DASH diet and commitment to daily physical activity for a minimum of 30 minutes discussed and encouraged, as a part of hypertension management. The importance of attaining a healthy weight is also discussed.     04/16/2023    4:13 PM 03/26/2023    5:52 PM 03/26/2023    2:06 PM 03/26/2023    2:05 PM 02/04/2023    8:12 AM 01/22/2023    3:28 PM 01/14/2023   12:26 PM  BP/Weight  Systolic BP 132 124  112 142 111 117  Diastolic BP 81 83  86 98 72 71  Wt. (Lbs) 206.08  180  209 205.04 204  BMI 36.51 kg/m2  31.89 kg/m2  37.02 kg/m2 36.32 kg/m2 36.14 kg/m2     Controlled, no change in medication

## 2023-04-20 NOTE — Assessment & Plan Note (Signed)
Primarily triggered by significant health challenges in close members of her family, self, son, mother, cousin who she lives with , will benefit from therapy and agrees, will refer, med also started

## 2023-04-20 NOTE — Assessment & Plan Note (Signed)
Requests referral for vision evaluation refer to dr Charise Killian

## 2023-04-21 ENCOUNTER — Ambulatory Visit: Payer: Self-pay | Admitting: Licensed Clinical Social Worker
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
## 2023-04-24 ENCOUNTER — Ambulatory Visit (HOSPITAL_BASED_OUTPATIENT_CLINIC_OR_DEPARTMENT_OTHER): Payer: Medicare Other | Admitting: Orthopaedic Surgery

## 2023-04-24 ENCOUNTER — Ambulatory Visit (HOSPITAL_BASED_OUTPATIENT_CLINIC_OR_DEPARTMENT_OTHER): Payer: Medicare Other | Admitting: Student

## 2023-04-24 ENCOUNTER — Ambulatory Visit (INDEPENDENT_AMBULATORY_CARE_PROVIDER_SITE_OTHER): Payer: Medicare Other

## 2023-04-24 DIAGNOSIS — N912 Amenorrhea, unspecified: Secondary | ICD-10-CM

## 2023-04-24 MED ORDER — MEDROXYPROGESTERONE ACETATE 150 MG/ML IM SUSP
150.0000 mg | Freq: Once | INTRAMUSCULAR | Status: AC
Start: 2023-04-24 — End: 2023-04-24
  Administered 2023-04-24: 150 mg via INTRAMUSCULAR

## 2023-04-27 ENCOUNTER — Ambulatory Visit (HOSPITAL_BASED_OUTPATIENT_CLINIC_OR_DEPARTMENT_OTHER): Payer: Medicare Other | Admitting: Student

## 2023-04-27 ENCOUNTER — Telehealth (HOSPITAL_BASED_OUTPATIENT_CLINIC_OR_DEPARTMENT_OTHER): Payer: Self-pay | Admitting: Orthopaedic Surgery

## 2023-04-27 ENCOUNTER — Telehealth (HOSPITAL_BASED_OUTPATIENT_CLINIC_OR_DEPARTMENT_OTHER): Payer: Self-pay

## 2023-04-27 NOTE — Telephone Encounter (Signed)
Reminder. Please contact pt to go over MRI results, pt cancelled off jackson's schedule for today as requested

## 2023-04-27 NOTE — Telephone Encounter (Signed)
Patient wants to when Dr B will call her again about her MRI

## 2023-04-28 ENCOUNTER — Other Ambulatory Visit (HOSPITAL_BASED_OUTPATIENT_CLINIC_OR_DEPARTMENT_OTHER): Payer: Self-pay | Admitting: Orthopaedic Surgery

## 2023-04-28 DIAGNOSIS — M2390 Unspecified internal derangement of unspecified knee: Secondary | ICD-10-CM

## 2023-05-11 DIAGNOSIS — R030 Elevated blood-pressure reading, without diagnosis of hypertension: Secondary | ICD-10-CM | POA: Diagnosis not present

## 2023-05-11 DIAGNOSIS — E559 Vitamin D deficiency, unspecified: Secondary | ICD-10-CM | POA: Diagnosis not present

## 2023-05-11 DIAGNOSIS — I10 Essential (primary) hypertension: Secondary | ICD-10-CM | POA: Diagnosis not present

## 2023-05-11 DIAGNOSIS — M1712 Unilateral primary osteoarthritis, left knee: Secondary | ICD-10-CM | POA: Diagnosis not present

## 2023-05-11 DIAGNOSIS — M25562 Pain in left knee: Secondary | ICD-10-CM | POA: Diagnosis not present

## 2023-05-11 DIAGNOSIS — R7303 Prediabetes: Secondary | ICD-10-CM | POA: Diagnosis not present

## 2023-05-11 DIAGNOSIS — G8929 Other chronic pain: Secondary | ICD-10-CM | POA: Diagnosis not present

## 2023-05-11 DIAGNOSIS — M47812 Spondylosis without myelopathy or radiculopathy, cervical region: Secondary | ICD-10-CM | POA: Diagnosis not present

## 2023-05-11 DIAGNOSIS — Z79899 Other long term (current) drug therapy: Secondary | ICD-10-CM | POA: Diagnosis not present

## 2023-05-11 DIAGNOSIS — M25561 Pain in right knee: Secondary | ICD-10-CM | POA: Diagnosis not present

## 2023-05-25 ENCOUNTER — Ambulatory Visit: Payer: Self-pay | Admitting: Licensed Clinical Social Worker
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
## 2023-05-28 ENCOUNTER — Telehealth: Payer: Medicare Other | Admitting: Emergency Medicine

## 2023-05-28 DIAGNOSIS — J029 Acute pharyngitis, unspecified: Secondary | ICD-10-CM | POA: Diagnosis not present

## 2023-05-28 NOTE — Patient Instructions (Signed)
Ihor Dow, thank you for joining Roxy Horseman, PA-C for today's virtual visit.  While this provider is not your primary care provider (PCP), if your PCP is located in our provider database this encounter information will be shared with them immediately following your visit.   A [facility] MyChart account gives you access to today's visit and all your visits, tests, and labs performed at [facility] " click here if you don't have a [facility] MyChart account or go to [email or web address]  Consent: (Patient) Martha Montgomery provided verbal consent for this virtual visit at the beginning of the encounter.  Current Medications:  Current Outpatient Medications:    Cholecalciferol (VITAMIN D3) 25 MCG (1000 UT) CAPS, Take 1 capsule (1,000 Units total) by mouth daily., Disp: 90 capsule, Rfl: 1   diphenhydrAMINE (BENADRYL) 25 MG tablet, Take 25 mg by mouth in the morning and at bedtime., Disp: , Rfl:    EPINEPHrine 0.3 mg/0.3 mL IJ SOAJ injection, Inject 0.3 mg into the muscle as needed for anaphylaxis., Disp: 1 each, Rfl: 0   escitalopram (LEXAPRO) 10 MG tablet, Take 1 tablet (10 mg total) by mouth at bedtime., Disp: 30 tablet, Rfl: 3   fluticasone (FLONASE) 50 MCG/ACT nasal spray, Place 2 sprays into both nostrils daily., Disp: 15.8 mL, Rfl: 1   levothyroxine (SYNTHROID) 200 MCG tablet, TAKE 1 TABLET(200 MCG) BY MOUTH AT BEDTIME, Disp: 90 tablet, Rfl: 1   linaclotide (LINZESS) 290 MCG CAPS capsule, Take 1 capsule (290 mcg total) by mouth daily before breakfast., Disp: 90 capsule, Rfl: 3   medroxyPROGESTERone (DEPO-PROVERA) 150 MG/ML injection, Inject 150 mg into the muscle every 3 (three) months., Disp: , Rfl:    methocarbamol (ROBAXIN) 500 MG tablet, Take 1 tablet (500 mg total) by mouth 2 (two) times daily., Disp: 20 tablet, Rfl: 0   morphine (MS CONTIN) 15 MG 12 hr tablet, Take 15 mg by mouth 3 (three) times daily as needed., Disp: , Rfl:    Olmesartan-amLODIPine-HCTZ  40-10-25 MG TABS, Take 1 tablet by mouth daily., Disp: , Rfl:    pantoprazole (PROTONIX) 40 MG tablet, Take 1 tablet (40 mg total) by mouth 2 (two) times daily before a meal., Disp: 180 tablet, Rfl: 3   rosuvastatin (CRESTOR) 20 MG tablet, Take 1 tablet (20 mg total) by mouth daily., Disp: 30 tablet, Rfl: 5   traMADol (ULTRAM) 50 MG tablet, Take by mouth every 12 (twelve) hours as needed., Disp: , Rfl:    Medications ordered in this encounter:  No orders of the defined types were placed in this encounter.    *If you need refills on other medications prior to your next appointment, please contact your pharmacy*  Follow-Up: Call back or seek an in-person evaluation if the symptoms worsen or if the condition fails to improve as anticipated.  [facility]  Virtual Care [phone]  Other Instructions    If you have been instructed to have an in-person evaluation today at a local Urgent Care facility, please use the link below. It will take you to a list of all of our available [facility], including address, phone number and hours of operation. Please do not delay care.  [facility]  If you or a family member do not have a primary care provider, use the link below to schedule a visit and establish care. When you choose a [facility] primary care physician or advanced practice provider, you gain a long-term partner in health. Find a  Primary Care Provider  Learn more about [facility]'s in-office and virtual care options: [facility] - Get Care Now

## 2023-05-28 NOTE — Progress Notes (Signed)
Virtual Visit Consent   Martha Montgomery, you are scheduled for a virtual visit with a [facility] provider today. Just as with appointments in the office, your consent must be obtained to participate. Your consent will be active for this visit and any virtual visit you may have with one of our providers in the next 365 days. If you have a MyChart account, a copy of this consent can be sent to you electronically.  As this is a virtual visit, video technology does not allow for your provider to perform a traditional examination. This may limit your provider's ability to fully assess your condition. If your provider identifies any concerns that need to be evaluated in person or the need to arrange testing (such as labs, EKG, etc.), we will make arrangements to do so. Although advances in technology are sophisticated, we cannot ensure that it will always work on either your end or our end. If the connection with a video visit is poor, the visit may have to be switched to a telephone visit. With either a video or telephone visit, we are not always able to ensure that we have a secure connection.  By engaging in this virtual visit, you consent to the provision of healthcare and authorize for your insurance to be billed (if applicable) for the services provided during this visit. Depending on your insurance coverage, you may receive a charge related to this service.  I need to obtain your verbal consent now. Are you willing to proceed with your visit today? [name] has provided verbal consent on 05/28/2023 for a virtual visit (video or telephone). Roxy Horseman, PA-C  Date: 05/28/2023 10:03 AM  Virtual Visit via Video Note   I, Roxy Horseman, connected with  [name]  ([id number], 1981-06-09) on 05/28/23 at 10:00 AM EDT by a video-enabled telemedicine application and verified that I am speaking with the correct person using two identifiers.  Location: Patient: Virtual Visit Location Patient:  Home Provider: Virtual Visit Location Provider: Home Office   I discussed the limitations of evaluation and management by telemedicine and the availability of in person appointments. The patient expressed understanding and agreed to proceed.    History of Present Illness: Martha Montgomery is a 42 y.o. who identifies as a female who was assigned female at birth, and is being seen today for sore throat and irritation.  Onset yesterday.  Reports sick contacts.  Has tried taking Mucinex and nasal spray.  Denies fever or chill.  HPI: HPI  Problems:  Patient Active Problem List   Diagnosis Date Noted   GAD (generalized anxiety disorder) 04/20/2023   Mild depression 04/20/2023   Depression, major, single episode, mild (HCC) 04/20/2023   Decreased vision 04/20/2023   Bladder distension 04/20/2023   Encounter for surveillance of injectable contraceptive 11/07/2022   Encounter for Medicare annual examination with abnormal findings 08/07/2022   Hyperlipidemia 06/08/2022   Worm infestation 05/12/2022   Generalized joint pain 03/26/2022   Twitching 09/03/2021   Toxic effect of kerosene 09/03/2021   Hypertension    Finger pain, right 07/31/2020   Bilateral groin pain 07/31/2020   Dental caries 04/24/2019   Morbid obesity due to excess calories (HCC) 01/08/2019   Multiple benign lumps of breast 08/17/2018   Hemorrhoids 05/19/2018   Encounter for contraceptive management 03/20/2018   Low vitamin D level 11/30/2017   Bloating 03/05/2017   Bilateral knee pain 01/05/2017   Amenorrhea 01/05/2017   Bilateral hip pain 07/22/2016  Post-surgical hypothyroidism 06/02/2014   Knee pain 06/02/2012   Patellar malalignment syndrome 03/09/2012   IBS (irritable bowel syndrome) 08/29/2011   GERD (gastroesophageal reflux disease) 08/29/2011   Back pain 09/24/2010   Migraine headache 04/02/2010   Constipation 09/12/2009   Allergic rhinitis 06/16/2009    Allergies:  Allergies  Allergen Reactions    Aspirin     Other reaction(s): Bleeding (intolerance) Rectal bleeding   Codeine Itching   Diclofenac     gastritis   Ibuprofen Other (See Comments)    Stomach pain   Metronidazole     Patient cannot remember   Nsaids    Soybean-Containing Drug Products Other (See Comments)    Tongue swells a little and feels "rug burned".   Medications:  Current Outpatient Medications:    Cholecalciferol (VITAMIN D3) 25 MCG (1000 UT) CAPS, Take 1 capsule (1,000 Units total) by mouth daily., Disp: 90 capsule, Rfl: 1   diphenhydrAMINE (BENADRYL) 25 MG tablet, Take 25 mg by mouth in the morning and at bedtime., Disp: , Rfl:    EPINEPHrine 0.3 mg/0.3 mL IJ SOAJ injection, Inject 0.3 mg into the muscle as needed for anaphylaxis., Disp: 1 each, Rfl: 0   escitalopram (LEXAPRO) 10 MG tablet, Take 1 tablet (10 mg total) by mouth at bedtime., Disp: 30 tablet, Rfl: 3   fluticasone (FLONASE) 50 MCG/ACT nasal spray, Place 2 sprays into both nostrils daily., Disp: 15.8 mL, Rfl: 1   levothyroxine (SYNTHROID) 200 MCG tablet, TAKE 1 TABLET(200 MCG) BY MOUTH AT BEDTIME, Disp: 90 tablet, Rfl: 1   linaclotide (LINZESS) 290 MCG CAPS capsule, Take 1 capsule (290 mcg total) by mouth daily before breakfast., Disp: 90 capsule, Rfl: 3   medroxyPROGESTERone (DEPO-PROVERA) 150 MG/ML injection, Inject 150 mg into the muscle every 3 (three) months., Disp: , Rfl:    methocarbamol (ROBAXIN) 500 MG tablet, Take 1 tablet (500 mg total) by mouth 2 (two) times daily., Disp: 20 tablet, Rfl: 0   morphine (MS CONTIN) 15 MG 12 hr tablet, Take 15 mg by mouth 3 (three) times daily as needed., Disp: , Rfl:    Olmesartan-amLODIPine-HCTZ 40-10-25 MG TABS, Take 1 tablet by mouth daily., Disp: , Rfl:    pantoprazole (PROTONIX) 40 MG tablet, Take 1 tablet (40 mg total) by mouth 2 (two) times daily before a meal., Disp: 180 tablet, Rfl: 3   rosuvastatin (CRESTOR) 20 MG tablet, Take 1 tablet (20 mg total) by mouth daily., Disp: 30 tablet, Rfl: 5    traMADol (ULTRAM) 50 MG tablet, Take by mouth every 12 (twelve) hours as needed., Disp: , Rfl:   Observations/Objective: Patient is well-developed, well-nourished in no acute distress.  Resting comfortably at home.  Head is normocephalic, atraumatic.  No labored breathing. Speech is clear and coherent with logical content.  Patient is alert and oriented at baseline.    Assessment and Plan: 1. Sore throat  Recommend Zyrtec or Claritin.  Discussed that antibiotics are likely not indicated due to onset just a day ago and no fever.  Doesn't meet [place] criteria.  Recommend supportive care.  Follow Up Instructions: I discussed the assessment and treatment plan with the patient. The patient was provided an opportunity to ask questions and all were answered. The patient agreed with the plan and demonstrated an understanding of the instructions.  A copy of instructions were sent to the patient via MyChart unless otherwise noted below.     The patient was advised to call back or seek an in-person evaluation if the  symptoms worsen or if the condition fails to improve as anticipated.    Roxy Horseman, PA-C

## 2023-06-08 ENCOUNTER — Ambulatory Visit: Payer: Self-pay | Admitting: Orthopaedic Surgery

## 2023-06-08 DIAGNOSIS — M25362 Other instability, left knee: Secondary | ICD-10-CM

## 2023-06-08 NOTE — Pre-Procedure Instructions (Signed)
Surgical Instructions   Your procedure is scheduled on June 16, 2023. Report to [facility] Main Entrance "A" at 11:15 A.M., then check in with the Admitting office. Any questions or running late day of surgery: call [phone]  Questions prior to your surgery date: call [phone], Monday-Friday, 8am-4pm. If you experience any cold or flu symptoms such as cough, fever, chills, shortness of breath, etc. between now and your scheduled surgery, please notify us at the above number.     Remember:  Do not eat after midnight the night before your surgery   You may drink clear liquids until 10:15 AM the morning of your surgery.   Clear liquids allowed are: Water, Non-Citrus Juices (without pulp), Carbonated Beverages, Clear Tea (no milk, honey, etc.), Black Coffee Only (NO MILK, CREAM OR POWDERED CREAMER of any kind), and Gatorade.    Take these medicines the morning of surgery with A SIP OF WATER: diphenhydrAMINE (BENADRYL)  escitalopram (LEXAPRO)  morphine (MSIR)  pantoprazole (PROTONIX)  pregabalin (LYRICA)  rosuvastatin (CRESTOR)    May take these medicines IF NEEDED: carboxymethylcellulose (REFRESH PLUS) eye drops fluticasone (FLONASE) nasal spray  traMADol (ULTRAM)    One week prior to surgery, STOP taking any Aspirin (unless otherwise instructed by your surgeon) Aleve, Naproxen, Ibuprofen, Motrin, Advil, Goody's, BC's, all herbal medications, fish oil, and non-prescription vitamins.                     Do NOT Smoke (Tobacco/Vaping) for 24 hours prior to your procedure.  If you use a CPAP at night, you may bring your mask/headgear for your overnight stay.   You will be asked to remove any contacts, glasses, piercing's, hearing aid's, dentures/partials prior to surgery. Please bring cases for these items if needed.    Patients discharged the day of surgery will not be allowed to drive home, and someone needs to stay with them for 24 hours.  SURGICAL WAITING ROOM  VISITATION Patients may have no more than 2 support people in the waiting area - these visitors may rotate.   Pre-op nurse will coordinate an appropriate time for 1 ADULT support person, who may not rotate, to accompany patient in pre-op.  Children under the age of 12 must have an adult with them who is not the patient and must remain in the main waiting area with an adult.  If the patient needs to stay at the hospital during part of their recovery, the visitor guidelines for inpatient rooms apply.  Please refer to the [facility] website for the visitor guidelines for any additional information.   If you received a COVID test during your pre-op visit  it is requested that you wear a mask when out in public, stay away from anyone that may not be feeling well and notify your surgeon if you develop symptoms. If you have been in contact with anyone that has tested positive in the last 10 days please notify you surgeon.      Pre-operative CHG Bathing Instructions   You can play a key role in reducing the risk of infection after surgery. Your skin needs to be as free of germs as possible. You can reduce the number of germs on your skin by washing with CHG (chlorhexidine gluconate) soap before surgery. CHG is an antiseptic soap that kills germs and continues to kill germs even after washing.   DO NOT use if you have an allergy to chlorhexidine/CHG or antibacterial soaps. If your skin becomes reddened or  irritated, stop using the CHG and notify one of our RNs at [phone].              TAKE A SHOWER THE NIGHT BEFORE SURGERY AND THE DAY OF SURGERY    Please keep in mind the following:  DO NOT shave, including legs and underarms, 48 hours prior to surgery.   You may shave your face before/day of surgery.  Place clean sheets on your bed the night before surgery Use a clean washcloth (not used since being washed) for each shower. DO NOT sleep with pet's night before surgery.  CHG Shower  Instructions:  Wash your face and private area with normal soap. If you choose to wash your hair, wash first with your normal shampoo.  After you use shampoo/soap, rinse your hair and body thoroughly to remove shampoo/soap residue.  Turn the water OFF and apply half the bottle of CHG soap to a CLEAN washcloth.  Apply CHG soap ONLY FROM YOUR NECK DOWN TO YOUR TOES (washing for 3-5 minutes)  DO NOT use CHG soap on face, private areas, open wounds, or sores.  Pay special attention to the area where your surgery is being performed.  If you are having back surgery, having someone wash your back for you may be helpful. Wait 2 minutes after CHG soap is applied, then you may rinse off the CHG soap.  Pat dry with a clean towel  Put on clean pajamas    Additional instructions for the day of surgery: DO NOT APPLY any lotions, deodorants, cologne, or perfumes.   Do not wear jewelry or makeup Do not wear nail polish, gel polish, artificial nails, or any other type of covering on natural nails (fingers and toes) Do not bring valuables to the hospital. [facility] is not responsible for valuables/personal belongings. Put on clean/comfortable clothes.  Please brush your teeth.  Ask your nurse before applying any prescription medications to the skin.

## 2023-06-09 ENCOUNTER — Inpatient Hospital Stay (HOSPITAL_COMMUNITY)
Admission: RE | Admit: 2023-06-09 | Discharge: 2023-06-09 | Disposition: A | Discharge status: Home / Self Care | Payer: Medicare Other | Source: Ambulatory Visit | Attending: Orthopaedic Surgery

## 2023-06-09 ENCOUNTER — Encounter (HOSPITAL_COMMUNITY): Payer: Self-pay

## 2023-06-09 ENCOUNTER — Other Ambulatory Visit: Payer: Self-pay

## 2023-06-09 VITALS — BP 113/98 | HR 94 | Temp 99.0°F | Resp 17 | Ht 63.0 in | Wt 209.0 lb

## 2023-06-09 DIAGNOSIS — I10 Essential (primary) hypertension: Secondary | ICD-10-CM | POA: Diagnosis not present

## 2023-06-09 DIAGNOSIS — Z01818 Encounter for other preprocedural examination: Secondary | ICD-10-CM | POA: Diagnosis not present

## 2023-06-09 LAB — BASIC METABOLIC PANEL
Anion gap: 11 (ref 5–15)
BUN: 11 mg/dL (ref 6–20)
CO2: 23 mmol/L (ref 22–32)
Calcium: 8.6 mg/dL — ABNORMAL LOW (ref 8.9–10.3)
Chloride: 104 mmol/L (ref 98–111)
Creatinine, Ser: 0.83 mg/dL (ref 0.44–1.00)
GFR, Estimated: >60 mL/min (ref >60)
Glucose, Bld: 84 mg/dL (ref 70–99)
Potassium: 3.4 mmol/L — ABNORMAL LOW (ref 3.5–5.1)
Sodium: 138 mmol/L (ref 135–145)

## 2023-06-09 LAB — CBC
HCT: 39.3 % (ref 36.0–46.0)
Hemoglobin: 13.3 g/dL (ref 12.0–15.0)
MCH: 30.6 pg (ref 26.0–34.0)
MCHC: 33.8 g/dL (ref 30.0–36.0)
MCV: 90.6 fL (ref 80.0–100.0)
Platelets: 492 10*3/uL — ABNORMAL HIGH (ref 150–400)
RBC: 4.34 MIL/uL (ref 3.87–5.11)
RDW: 13.2 % (ref 11.5–15.5)
WBC: 9.2 10*3/uL (ref 4.0–10.5)
nRBC: 0 % (ref 0.0–0.2)

## 2023-06-09 NOTE — Progress Notes (Signed)
PCP - Dr. Syliva Overman Cardiologist - Dr. Rosita [facility])  PPM/ICD - denies   Chest x-ray - 01/12/22 EKG - 06/09/23 Stress Test - denies ECHO - denies Cardiac Cath - denies  Sleep Study - denies   DM- denies  ASA/Blood Thinner Instructions: n/a   ERAS Protcol - yes PRE-SURGERY Ensure given at PAT  COVID TEST- n/a   Anesthesia review: yes, records requested from cardiology. Pt states she sees Dr. Mercy Riding only to f/u on her HTN  Patient denies shortness of breath, fever, cough and chest pain at PAT appointment   All instructions explained to the patient, with a verbal understanding of the material. Patient agrees to go over the instructions while at home for a better understanding.  The opportunity to ask questions was provided.

## 2023-06-09 NOTE — Pre-Procedure Instructions (Signed)
Surgical Instructions   Your procedure is scheduled on June 16, 2023. Report to [facility] Main Entrance "A" at 11:15 A.M., then check in with the Admitting office. Any questions or running late day of surgery: call [phone]  Questions prior to your surgery date: call [phone], Monday-Friday, 8am-4pm. If you experience any cold or flu symptoms such as cough, fever, chills, shortness of breath, etc. between now and your scheduled surgery, please notify us at the above number.     Remember:  Do not eat after midnight the night before your surgery   You may drink clear liquids until 10:15 AM the morning of your surgery.   Clear liquids allowed are: Water, Non-Citrus Juices (without pulp), Carbonated Beverages, Clear Tea (no milk, honey, etc.), Black Coffee Only (NO MILK, CREAM OR POWDERED CREAMER of any kind), and Gatorade.  Patient Instructions  The night before surgery:  No food after midnight. ONLY clear liquids after midnight  The day of surgery (if you do NOT have diabetes):  Drink ONE (1) Pre-Surgery Clear Ensure by 10:15 AM the morning of surgery. Drink in one sitting. Do not sip.  This drink was given to you during your hospital  pre-op appointment visit.  Nothing else to drink after completing the  Pre-Surgery Clear Ensure.          If you have questions, please contact your surgeon's office.    Take these medicines the morning of surgery with A SIP OF WATER: diphenhydrAMINE (BENADRYL)  escitalopram (LEXAPRO)  morphine (MSIR)  pantoprazole (PROTONIX)  pregabalin (LYRICA)  rosuvastatin (CRESTOR)    May take these medicines IF NEEDED: carboxymethylcellulose (REFRESH PLUS) eye drops fluticasone (FLONASE) nasal spray  traMADol (ULTRAM)    One week prior to surgery, STOP taking any Aspirin (unless otherwise instructed by your surgeon) Aleve, Naproxen, Ibuprofen, Motrin, Advil, Goody's, BC's, all herbal medications, fish oil, and non-prescription  vitamins.                     Do NOT Smoke (Tobacco/Vaping) for 24 hours prior to your procedure.  If you use a CPAP at night, you may bring your mask/headgear for your overnight stay.   You will be asked to remove any contacts, glasses, piercing's, hearing aid's, dentures/partials prior to surgery. Please bring cases for these items if needed.    Patients discharged the day of surgery will not be allowed to drive home, and someone needs to stay with them for 24 hours.  SURGICAL WAITING ROOM VISITATION Patients may have no more than 2 support people in the waiting area - these visitors may rotate.   Pre-op nurse will coordinate an appropriate time for 1 ADULT support person, who may not rotate, to accompany patient in pre-op.  Children under the age of 43 must have an adult with them who is not the patient and must remain in the main waiting area with an adult.  If the patient needs to stay at the hospital during part of their recovery, the visitor guidelines for inpatient rooms apply.  Please refer to the [facility] website for the visitor guidelines for any additional information.   If you received a COVID test during your pre-op visit  it is requested that you wear a mask when out in public, stay away from anyone that may not be feeling well and notify your surgeon if you develop symptoms. If you have been in contact with anyone that has tested positive in the last 10 days please notify  you surgeon.      Pre-operative CHG Bathing Instructions   You can play a key role in reducing the risk of infection after surgery. Your skin needs to be as free of germs as possible. You can reduce the number of germs on your skin by washing with CHG (chlorhexidine gluconate) soap before surgery. CHG is an antiseptic soap that kills germs and continues to kill germs even after washing.   DO NOT use if you have an allergy to chlorhexidine/CHG or antibacterial soaps. If your skin becomes reddened or  irritated, stop using the CHG and notify one of our RNs at [phone].              TAKE A SHOWER THE NIGHT BEFORE SURGERY AND THE DAY OF SURGERY    Please keep in mind the following:  DO NOT shave, including legs and underarms, 48 hours prior to surgery.   You may shave your face before/day of surgery.  Place clean sheets on your bed the night before surgery Use a clean washcloth (not used since being washed) for each shower. DO NOT sleep with pet's night before surgery.  CHG Shower Instructions:  Wash your face and private area with normal soap. If you choose to wash your hair, wash first with your normal shampoo.  After you use shampoo/soap, rinse your hair and body thoroughly to remove shampoo/soap residue.  Turn the water OFF and apply half the bottle of CHG soap to a CLEAN washcloth.  Apply CHG soap ONLY FROM YOUR NECK DOWN TO YOUR TOES (washing for 3-5 minutes)  DO NOT use CHG soap on face, private areas, open wounds, or sores.  Pay special attention to the area where your surgery is being performed.  If you are having back surgery, having someone wash your back for you may be helpful. Wait 2 minutes after CHG soap is applied, then you may rinse off the CHG soap.  Pat dry with a clean towel  Put on clean pajamas    Additional instructions for the day of surgery: DO NOT APPLY any lotions, deodorants, cologne, or perfumes.   Do not wear jewelry or makeup Do not wear nail polish, gel polish, artificial nails, or any other type of covering on natural nails (fingers and toes) Do not bring valuables to the hospital. [facility] is not responsible for valuables/personal belongings. Put on clean/comfortable clothes.  Please brush your teeth.  Ask your nurse before applying any prescription medications to the skin.

## 2023-06-10 DIAGNOSIS — R7303 Prediabetes: Secondary | ICD-10-CM | POA: Diagnosis not present

## 2023-06-10 DIAGNOSIS — Z79899 Other long term (current) drug therapy: Secondary | ICD-10-CM | POA: Diagnosis not present

## 2023-06-10 DIAGNOSIS — M25562 Pain in left knee: Secondary | ICD-10-CM | POA: Diagnosis not present

## 2023-06-10 DIAGNOSIS — I10 Essential (primary) hypertension: Secondary | ICD-10-CM | POA: Diagnosis not present

## 2023-06-10 DIAGNOSIS — G8929 Other chronic pain: Secondary | ICD-10-CM | POA: Diagnosis not present

## 2023-06-10 DIAGNOSIS — E559 Vitamin D deficiency, unspecified: Secondary | ICD-10-CM | POA: Diagnosis not present

## 2023-06-10 DIAGNOSIS — M1712 Unilateral primary osteoarthritis, left knee: Secondary | ICD-10-CM | POA: Diagnosis not present

## 2023-06-10 DIAGNOSIS — M25561 Pain in right knee: Secondary | ICD-10-CM | POA: Diagnosis not present

## 2023-06-10 DIAGNOSIS — M542 Cervicalgia: Secondary | ICD-10-CM | POA: Diagnosis not present

## 2023-06-10 DIAGNOSIS — M47812 Spondylosis without myelopathy or radiculopathy, cervical region: Secondary | ICD-10-CM | POA: Diagnosis not present

## 2023-06-10 NOTE — Anesthesia Preprocedure Evaluation (Addendum)
Anesthesia Evaluation  Patient identified by MRN, date of birth, ID band Patient awake    Reviewed: Allergy & Precautions, NPO status , Patient's Chart, lab work & pertinent test results  History of Anesthesia Complications Negative for: history of anesthetic complications  Airway Mallampati: I  TM Distance: >3 FB Neck ROM: Full    Dental  (+) Dental Advisory Given   Pulmonary neg shortness of breath, neg sleep apnea, neg COPD, neg recent URI, former smoker   Pulmonary exam normal breath sounds clear to auscultation       Cardiovascular hypertension (olmesartan-amlodipine-HCTZ), Pt. on medications (-) angina (-) Past MI, (-) Cardiac Stents and (-) CABG (-) dysrhythmias  Rhythm:Regular Rate:Normal  HLD   Neuro/Psych  Headaches, neg Seizures PSYCHIATRIC DISORDERS Anxiety Depression     Neuromuscular disease (spinal stenosis)    GI/Hepatic Neg liver ROS,GERD  Medicated,,  Endo/Other  neg diabetesHypothyroidism    Renal/GU negative Renal ROS     Musculoskeletal  (+) Arthritis ,  Fibromyalgia -  Abdominal  (+) + obese  Peds  Hematology negative hematology ROS (+) Lab Results      Component                Value               Date                      WBC                      9.2                 06/09/2023                HGB                      13.3                06/09/2023                HCT                      39.3                06/09/2023                MCV                      90.6                06/09/2023                PLT                      492 (H)             06/09/2023              Anesthesia Other Findings Takes morphine 15 mg TID. Took at 8:00 this morning.  Reproductive/Obstetrics                              Anesthesia Physical Anesthesia Plan  ASA: 2  Anesthesia Plan: General   Post-op Pain Management: Regional block* and Tylenol PO (pre-op)*   Induction:  Intravenous  PONV Risk Score and Plan: 3 and Ondansetron, Dexamethasone and Treatment may vary due  to age or medical condition  Airway Management Planned: LMA  Additional Equipment:   Intra-op Plan:   Post-operative Plan: Extubation in OR  Informed Consent: I have reviewed the patients History and Physical, chart, labs and discussed the procedure including the risks, benefits and alternatives for the proposed anesthesia with the patient or authorized representative who has indicated his/her understanding and acceptance.     Dental advisory given  Plan Discussed with: CRNA and Anesthesiologist  Anesthesia Plan Comments: (PAT note written 06/10/2023 by Shonna Chock, PA-C.  Risks of general anesthesia discussed including, but not limited to, sore throat, hoarse voice, chipped/damaged teeth, injury to vocal cords, nausea and vomiting, allergic reactions, lung infection, heart attack, stroke, and death. All questions answered.  Discussed potential risks of nerve blocks including, but not limited to, infection, bleeding, nerve damage, seizures, pneumothorax, respiratory depression, and potential failure of the block. Alternatives to nerve blocks discussed. All questions answered.   )        Anesthesia Quick Evaluation

## 2023-06-10 NOTE — Progress Notes (Addendum)
Anesthesia Chart Review:  Case: [id number] Date/Time: 06/16/23 1300   Procedures:      RIGHT KNEE ARTHROSCOPY WITH MEDIAL PATELLAR FEMORAL LIGAMENT RECONSTRUCTION (Right: Knee)     RIGHT KNEE TIBIAL TUBERLE OSTEOTOMY (Right)   Anesthesia type: Choice   Pre-op diagnosis: RIGHT KNEE PATELLAR MALALIGHMENT   Location: [facility] ROOM 04 / [facility]   Surgeons: Huel Cote, MD       DISCUSSION: Patient is a 42 year old female scheduled for the above procedure.  History includes former smoker (quit 09/11/10), HTN, thyroid goiter (s/p thyroidectomy, post-surgical hypothyroidisim), GERD, migraines, fibromyalgia, obesity.   She reported seeing cardiologist Dr. Rosita Kea for HTN, but denied cardiac testing. Records from [facility] requested.  BP 113/98 at PAT.  Last PCP evaluation with Dr. Lodema Hong was on 04/16/2023 for follow-up chronic medical conditions and medication management. BP 132/81. Followed at pain clinic and with orthopedics. She noted that knee surgery had been recommended.   06/09/23 EKG: NSR.  She denied chest pain and shortness of breath at PAT visit  ADDENDUM 06/12/23 2:58 PM: Received last 2 note from [facility], which were for Pain Management follow-up on 05/11/23 and 06/10/23. She is followed there by Chrys Racer DNP, FNP-C. No cardiology note received, but medical records confirmed no cardiac testing done there such as stress or echo. Per 06/10/23 visit, she denied chest pain, palpitations, SOB, dizziness, edema, cough.     VS: BP (!) 113/98   Pulse 94   Temp 37.2 C   Resp 17   Ht 5\' 3"  (1.6 m)   Wt 94.8 kg   SpO2 98%   BMI 37.02 kg/m    PROVIDERS: Kerri Perches, MD PCP - She reported seeing cardiologist Rosita Kea, MD with [facility], but denied having any cardiac testing done.   LABS: Labs reviewed: Acceptable for surgery. (all labs ordered are listed, but only abnormal results are displayed)  Labs Reviewed  BASIC  METABOLIC PANEL - Abnormal; Notable for the following components:      Result Value   Potassium 3.4 (*)    Calcium 8.6 (*)    All other components within normal limits  CBC - Abnormal; Notable for the following components:   Platelets 492 (*)    All other components within normal limits    IMAGES: Right Knee MRI 04/06/23: IMPRESSION: 1. No meniscal or ligamentous injury of the right knee. 2. Mild partial-thickness cartilage loss of the patellar apex extending into the medial and lateral patellar facets with subchondral reactive marrow edema. 3. Edema in superolateral Hoffa's fat as can be seen with patellar tendon-lateral femoral condyle friction syndrome.   Left Knee MRI 04/06/23: IMPRESSION: 1. No meniscal or ligamentous injury of the left knee. 2. Partial-thickness cartilage loss of the medial patellofemoral compartment. 3. Edema in superolateral Hoffa's fat as can be seen with patellar tendon-lateral femoral condyle friction syndrome.   CT Abd/pelvis 03/26/23: IMPRESSION: No acute or active process within the abdomen or pelvis.   EKG: 06/09/23: NSR   CV: N/A  Past Medical History:  Diagnosis Date   Anxiety    Anxiety disorder    Arthritis    "knees, joints, hands, toes" (09/28/2013)   Depression    Depression, major, single episode, in partial remission (HCC) 10/18/2008   Qualifier: Diagnosis of  By: Lodema Hong MD, Margaret  PHQ 9 score of 7 in 10/2017, not suicidal or homicidal, interested in telepsych however, score of 16 in  02/2018, not suicidal or homicidal,  start medication and refer to telepsych Score of 7 in 07/2018    Fibromyalgia    Gastritis nov. 2011   EGD by Dr. Darrick Penna, negative h pylori   GERD (gastroesophageal reflux disease)    Gout    Hypertension x 6 years    Hypothyroidism    Migraines    "15/month average" (09/28/2013)   Obesity (BMI 30.0-34.9)    Spinal stenosis    Thyroid goiter     Past Surgical History:  Procedure Laterality Date   BIOPSY   10/20/2017   Procedure: BIOPSY;  Surgeon: West Bali, MD;  Location: [facility] END[facility] SU[facility];  Service: Endoscopy;;  gastric duodenal esophagus   CESAREAN SECTION  2008   CHONDROPLASTY  04/02/2012   Procedure: CHONDROPLASTY;  Surgeon: Vickki Hearing, MD;  Location: [facility] ORS;  Service: Orthopedics;  Laterality: Left;  of left patella   COLONOSCOPY  11/2010   Dr. Darrick Penna: normal colon, small internal hemorrhoids   COLONOSCOPY WITH PROPOFOL N/A 10/22/2017   Surgeon: West Bali, MD; significant looping of the colon, internal hemorrhoids, otherwise normal exam.  Recommended 10-year surveillance.   COLONOSCOPY WITH PROPOFOL N/A 10/20/2017   Procedure: COLONOSCOPY WITH PROPOFOL;  Surgeon: West Bali, MD;  Location: [facility];  Service: Endoscopy;  Laterality: N/A;  11:00am   ESOPHAGOGASTRODUODENOSCOPY (EGD) WITH PROPOFOL N/A 10/20/2017   Surgeon: West Bali, MD; no endoscopic abnormality to explain dysphagia s/p empiric dilation, gastritis.  Esophageal biopsies consistent with reflux, gastric biopsies with gastric antral and oxyntic mucosa negative for H. pylori, duodenal biopsies benign.   INCISION AND DRAINAGE ABSCESS  1997; 2005   "under right buttocks; under left arm"   KNEE ARTHROSCOPY Left 2021   SAVORY DILATION N/A 10/20/2017   Procedure: SAVORY DILATION;  Surgeon: West Bali, MD;  Location: [facility];  Service: Endoscopy;  Laterality: N/A;   THYROIDECTOMY N/A 09/28/2013   Procedure: TOTAL THYROIDECTOMY;  Surgeon: Darletta Moll, MD;  Location: [facility] OR;  Service: ENT;  Laterality: N/A;   UPPER GI ENDOSCOPY  2011   EGD: gastritis, no h.pylori    MEDICATIONS:  carboxymethylcellulose (REFRESH PLUS) 0.5 % SOLN   Cholecalciferol (VITAMIN D3) 25 MCG (1000 UT) CAPS   Cyanocobalamin (B-12 PO)   diphenhydrAMINE (BENADRYL) 25 MG tablet   EPINEPHrine 0.3 mg/0.3 mL IJ SOAJ injection   escitalopram (LEXAPRO) 10 MG tablet   fluticasone (FLONASE) 50 MCG/ACT nasal spray   Ginger,  Zingiber officinalis, (GINGER PO)   levothyroxine (SYNTHROID) 200 MCG tablet   linaclotide (LINZESS) 290 MCG CAPS capsule   medroxyPROGESTERone (DEPO-PROVERA) 150 MG/ML injection   methocarbamol (ROBAXIN) 500 MG tablet   morphine (MSIR) 15 MG tablet   Olmesartan-amLODIPine-HCTZ 40-10-25 MG TABS   pantoprazole (PROTONIX) 40 MG tablet   pregabalin (LYRICA) 150 MG capsule   rosuvastatin (CRESTOR) 20 MG tablet   traMADol (ULTRAM) 50 MG tablet   Turmeric (QC TUMERIC COMPLEX PO)   Vitamin D, Ergocalciferol, (DRISDOL) 1.25 MG (50000 UNIT) CAPS capsule   No current facility-administered medications for this encounter.    Shonna Chock, PA-C Surgical Short Stay/Anesthesiology [facility] Phone [phone] [facility] Phone [phone] 06/10/2023 4:26 PM

## 2023-06-12 ENCOUNTER — Institutional Professional Consult (permissible substitution): Payer: Medicare Other | Admitting: Professional Counselor

## 2023-06-12 DIAGNOSIS — Z79899 Other long term (current) drug therapy: Secondary | ICD-10-CM | POA: Diagnosis not present

## 2023-06-16 ENCOUNTER — Ambulatory Visit (HOSPITAL_COMMUNITY): Payer: Medicare Other | Admitting: Physician Assistant

## 2023-06-16 ENCOUNTER — Other Ambulatory Visit: Payer: Self-pay

## 2023-06-16 ENCOUNTER — Ambulatory Visit (HOSPITAL_COMMUNITY): Payer: Medicare Other

## 2023-06-16 ENCOUNTER — Ambulatory Visit (HOSPITAL_BASED_OUTPATIENT_CLINIC_OR_DEPARTMENT_OTHER): Payer: Medicare Other | Admitting: Anesthesiology

## 2023-06-16 ENCOUNTER — Ambulatory Visit (HOSPITAL_COMMUNITY)
Admission: RE | Admit: 2023-06-16 | Discharge: 2023-06-16 | Disposition: A | Discharge status: Home / Self Care | Payer: Medicare Other | Attending: Orthopaedic Surgery | Admitting: Orthopaedic Surgery

## 2023-06-16 ENCOUNTER — Encounter (HOSPITAL_COMMUNITY)
Admission: RE | Disposition: A | Discharge status: Home / Self Care | Payer: Self-pay | Source: Home / Self Care | Attending: Orthopaedic Surgery

## 2023-06-16 ENCOUNTER — Encounter (HOSPITAL_COMMUNITY): Payer: Self-pay | Admitting: Orthopaedic Surgery

## 2023-06-16 DIAGNOSIS — M222X1 Patellofemoral disorders, right knee: Secondary | ICD-10-CM | POA: Diagnosis not present

## 2023-06-16 DIAGNOSIS — Z96698 Presence of other orthopedic joint implants: Secondary | ICD-10-CM | POA: Diagnosis not present

## 2023-06-16 DIAGNOSIS — S83001A Unspecified subluxation of right patella, initial encounter: Secondary | ICD-10-CM

## 2023-06-16 DIAGNOSIS — E039 Hypothyroidism, unspecified: Secondary | ICD-10-CM | POA: Diagnosis not present

## 2023-06-16 DIAGNOSIS — Z87891 Personal history of nicotine dependence: Secondary | ICD-10-CM | POA: Diagnosis not present

## 2023-06-16 DIAGNOSIS — Z79891 Long term (current) use of opiate analgesic: Secondary | ICD-10-CM | POA: Diagnosis not present

## 2023-06-16 DIAGNOSIS — I10 Essential (primary) hypertension: Secondary | ICD-10-CM | POA: Insufficient documentation

## 2023-06-16 DIAGNOSIS — M25362 Other instability, left knee: Secondary | ICD-10-CM

## 2023-06-16 DIAGNOSIS — K219 Gastro-esophageal reflux disease without esophagitis: Secondary | ICD-10-CM | POA: Diagnosis not present

## 2023-06-16 DIAGNOSIS — Z79899 Other long term (current) drug therapy: Secondary | ICD-10-CM | POA: Insufficient documentation

## 2023-06-16 DIAGNOSIS — M2351 Chronic instability of knee, right knee: Secondary | ICD-10-CM | POA: Diagnosis not present

## 2023-06-16 DIAGNOSIS — G8918 Other acute postprocedural pain: Secondary | ICD-10-CM | POA: Diagnosis not present

## 2023-06-16 DIAGNOSIS — M25361 Other instability, right knee: Secondary | ICD-10-CM

## 2023-06-16 DIAGNOSIS — M797 Fibromyalgia: Secondary | ICD-10-CM | POA: Insufficient documentation

## 2023-06-16 DIAGNOSIS — Z01818 Encounter for other preprocedural examination: Secondary | ICD-10-CM | POA: Diagnosis not present

## 2023-06-16 HISTORY — PX: TIBIA OSTEOTOMY: SHX1065

## 2023-06-16 HISTORY — PX: KNEE ARTHROSCOPY WITH MEDIAL PATELLAR FEMORAL LIGAMENT RECONSTRUCTION: SHX5652

## 2023-06-16 LAB — POCT PREGNANCY, URINE: Preg Test, Ur: NEGATIVE

## 2023-06-16 SURGERY — REPAIR, TENDON, PATELLAR, ARTHROSCOPIC
Anesthesia: General | Site: Knee | Laterality: Right

## 2023-06-16 MED ORDER — MIDAZOLAM HCL 2 MG/2ML IJ SOLN: 2.0000 mg | Freq: Once | INTRAMUSCULAR | Status: AC

## 2023-06-16 MED ORDER — FENTANYL CITRATE (PF) 250 MCG/5ML IJ SOLN
INTRAMUSCULAR | Status: AC
  Filled 2023-06-16: qty 5

## 2023-06-16 MED ORDER — ACETAMINOPHEN 500 MG PO TABS
ORAL_TABLET | ORAL | Status: AC
  Filled 2023-06-16: qty 2

## 2023-06-16 MED ORDER — ACETAMINOPHEN 500 MG PO TABS
1000.0000 mg | ORAL_TABLET | Freq: Once | ORAL | Status: DC
Start: 2023-06-16 — End: 2023-06-16

## 2023-06-16 MED ORDER — ACETAMINOPHEN 500 MG PO TABS: 500.0000 mg | ORAL_TABLET | Freq: Three times a day (TID) | ORAL | 0 refills | Status: AC

## 2023-06-16 MED ORDER — AMISULPRIDE (ANTIEMETIC) 5 MG/2ML IV SOLN: 10.0000 mg | Freq: Once | INTRAVENOUS | Status: DC | PRN

## 2023-06-16 MED ORDER — DEXAMETHASONE SODIUM PHOSPHATE 4 MG/ML IJ SOLN
INTRAMUSCULAR | Status: DC | PRN
  Administered 2023-06-16: 5 mg via INTRAVENOUS

## 2023-06-16 MED ORDER — PHENYLEPHRINE 80 MCG/ML (10ML) SYRINGE FOR IV PUSH (FOR BLOOD PRESSURE SUPPORT)
PREFILLED_SYRINGE | INTRAVENOUS | Status: DC | PRN
  Administered 2023-06-16 (×2): 160 ug via INTRAVENOUS
  Administered 2023-06-16 (×2): 80 ug via INTRAVENOUS
  Administered 2023-06-16: 160 ug via INTRAVENOUS

## 2023-06-16 MED ORDER — LIDOCAINE 2% (20 MG/ML) 5 ML SYRINGE
INTRAMUSCULAR | Status: AC
  Filled 2023-06-16: qty 5

## 2023-06-16 MED ORDER — DEXAMETHASONE SODIUM PHOSPHATE 10 MG/ML IJ SOLN
INTRAMUSCULAR | Status: AC
Start: 2023-06-16 — End: ?
  Filled 2023-06-16: qty 1

## 2023-06-16 MED ORDER — LACTATED RINGERS IV SOLN: INTRAVENOUS | Status: DC

## 2023-06-16 MED ORDER — CHLORHEXIDINE GLUCONATE 0.12 % MT SOLN
OROMUCOSAL | Status: AC
  Administered 2023-06-16: 15 mL via OROMUCOSAL
  Filled 2023-06-16: qty 15

## 2023-06-16 MED ORDER — CEFAZOLIN SODIUM-DEXTROSE 2-4 GM/100ML-% IV SOLN
INTRAVENOUS | Status: AC
  Filled 2023-06-16: qty 100

## 2023-06-16 MED ORDER — MIDAZOLAM HCL 2 MG/2ML IJ SOLN
INTRAMUSCULAR | Status: AC
  Administered 2023-06-16: 2 mg via INTRAVENOUS
  Filled 2023-06-16: qty 2

## 2023-06-16 MED ORDER — ONDANSETRON HCL 4 MG/2ML IJ SOLN
INTRAMUSCULAR | Status: AC
  Filled 2023-06-16: qty 2

## 2023-06-16 MED ORDER — MIDAZOLAM HCL 2 MG/2ML IJ SOLN
INTRAMUSCULAR | Status: AC
  Filled 2023-06-16: qty 2

## 2023-06-16 MED ORDER — 0.9 % SODIUM CHLORIDE (POUR BTL) OPTIME
TOPICAL | Status: DC | PRN
  Administered 2023-06-16: 1000 mL

## 2023-06-16 MED ORDER — TRANEXAMIC ACID-NACL 1000-0.7 MG/100ML-% IV SOLN
1000.0000 mg | INTRAVENOUS | Status: AC
Start: 2023-06-16 — End: 2023-06-16
  Administered 2023-06-16: 1000 mg via INTRAVENOUS

## 2023-06-16 MED ORDER — OXYCODONE HCL 5 MG PO TABS
5.0000 mg | ORAL_TABLET | Freq: Once | ORAL | Status: AC | PRN
  Administered 2023-06-16: 5 mg via ORAL

## 2023-06-16 MED ORDER — PROPOFOL 10 MG/ML IV BOLUS
INTRAVENOUS | Status: DC | PRN
  Administered 2023-06-16: 200 mg via INTRAVENOUS

## 2023-06-16 MED ORDER — SODIUM CHLORIDE 0.9 % IR SOLN
Status: DC | PRN
  Administered 2023-06-16: 1 mL

## 2023-06-16 MED ORDER — ROPIVACAINE HCL 5 MG/ML IJ SOLN
INTRAMUSCULAR | Status: DC | PRN
  Administered 2023-06-16: 20 mL via PERINEURAL

## 2023-06-16 MED ORDER — GABAPENTIN 300 MG PO CAPS
ORAL_CAPSULE | ORAL | Status: AC
  Filled 2023-06-16: qty 1

## 2023-06-16 MED ORDER — LIDOCAINE 2% (20 MG/ML) 5 ML SYRINGE
INTRAMUSCULAR | Status: DC | PRN
  Administered 2023-06-16: 100 mg via INTRAVENOUS

## 2023-06-16 MED ORDER — OXYCODONE HCL 5 MG PO TABS
ORAL_TABLET | ORAL | Status: AC
  Filled 2023-06-16: qty 1

## 2023-06-16 MED ORDER — EPINEPHRINE PF 1 MG/ML IJ SOLN
INTRAMUSCULAR | Status: AC
  Filled 2023-06-16: qty 4

## 2023-06-16 MED ORDER — FENTANYL CITRATE (PF) 100 MCG/2ML IJ SOLN
INTRAMUSCULAR | Status: AC
  Filled 2023-06-16: qty 2

## 2023-06-16 MED ORDER — VANCOMYCIN HCL 1000 MG IV SOLR
INTRAVENOUS | Status: AC
  Filled 2023-06-16: qty 20

## 2023-06-16 MED ORDER — CHLORHEXIDINE GLUCONATE 0.12 % MT SOLN: 15.0000 mL | Freq: Once | OROMUCOSAL | Status: AC

## 2023-06-16 MED ORDER — TRANEXAMIC ACID-NACL 1000-0.7 MG/100ML-% IV SOLN
INTRAVENOUS | Status: AC
  Filled 2023-06-16: qty 100

## 2023-06-16 MED ORDER — FENTANYL CITRATE (PF) 100 MCG/2ML IJ SOLN
INTRAMUSCULAR | Status: DC | PRN
  Administered 2023-06-16: 25 ug via INTRAVENOUS
  Administered 2023-06-16 (×2): 50 ug via INTRAVENOUS
  Administered 2023-06-16: 25 ug via INTRAVENOUS

## 2023-06-16 MED ORDER — MIDAZOLAM HCL 5 MG/5ML IJ SOLN
INTRAMUSCULAR | Status: DC | PRN
  Administered 2023-06-16: 2 mg via INTRAVENOUS

## 2023-06-16 MED ORDER — ASPIRIN 325 MG PO TBEC: 325.0000 mg | DELAYED_RELEASE_TABLET | Freq: Every day | ORAL | 0 refills | Status: DC

## 2023-06-16 MED ORDER — OXYCODONE HCL 5 MG/5ML PO SOLN: 5.0000 mg | Freq: Once | ORAL | Status: AC | PRN

## 2023-06-16 MED ORDER — HYDROMORPHONE HCL 1 MG/ML IJ SOLN
INTRAMUSCULAR | Status: AC
  Filled 2023-06-16: qty 1

## 2023-06-16 MED ORDER — OXYCODONE HCL 5 MG PO TABS: 5.0000 mg | ORAL_TABLET | ORAL | 0 refills | Status: DC | PRN

## 2023-06-16 MED ORDER — ONDANSETRON HCL 4 MG/2ML IJ SOLN
INTRAMUSCULAR | Status: DC | PRN
  Administered 2023-06-16: 4 mg via INTRAVENOUS

## 2023-06-16 MED ORDER — GABAPENTIN 300 MG PO CAPS
300.0000 mg | ORAL_CAPSULE | Freq: Once | ORAL | Status: DC
Start: 2023-06-16 — End: 2023-06-16

## 2023-06-16 MED ORDER — EPHEDRINE SULFATE-NACL 50-0.9 MG/10ML-% IV SOSY
PREFILLED_SYRINGE | INTRAVENOUS | Status: DC | PRN
  Administered 2023-06-16 (×3): 5 mg via INTRAVENOUS

## 2023-06-16 MED ORDER — ORAL CARE MOUTH RINSE: 15.0000 mL | Freq: Once | OROMUCOSAL | Status: AC

## 2023-06-16 MED ORDER — CEFAZOLIN SODIUM-DEXTROSE 2-4 GM/100ML-% IV SOLN
2.0000 g | INTRAVENOUS | Status: AC
Start: 2023-06-16 — End: 2023-06-16
  Administered 2023-06-16: 2 g via INTRAVENOUS

## 2023-06-16 MED ORDER — HYDROMORPHONE HCL 1 MG/ML IJ SOLN
0.2500 mg | INTRAMUSCULAR | Status: DC | PRN
  Administered 2023-06-16 (×4): 0.5 mg via INTRAVENOUS

## 2023-06-16 MED ORDER — SODIUM CHLORIDE 0.9 % IR SOLN
Status: DC | PRN
  Administered 2023-06-16: 3000 mL

## 2023-06-16 SURGICAL SUPPLY — 95 items
ALCOHOL 70% 16 OZ (MISCELLANEOUS) ×2 IMPLANT
ANCH DBL 2.6 SLF-PNCH FIBERTAK (Anchor) ×4 IMPLANT
ANCHOR DBL 2.6 SLF-PNCH FIBRTK (Anchor) IMPLANT
BAG COUNTER SPONGE SURGICOUNT (BAG) IMPLANT
BANDAGE ESMARK 6X9 LF (GAUZE/BANDAGES/DRESSINGS) IMPLANT
BIT DRILL 2.5 CANN STRL (BIT) IMPLANT
BIT DRILL 3.5 CANN STRL (BIT) IMPLANT
BLADE AVERAGE 25X9 (BLADE) IMPLANT
BLADE EXCALIBUR 4.0X13 (MISCELLANEOUS) ×2 IMPLANT
BLADE SURG 10 STRL SS (BLADE) IMPLANT
BLADE SURG 15 STRL LF DISP TIS (BLADE) ×4 IMPLANT
BLADE SURG 15 STRL SS (BLADE) ×4
BNDG ELASTIC 6INX 5YD STR LF (GAUZE/BANDAGES/DRESSINGS) IMPLANT
BNDG ELASTIC 6X15 VLCR STRL LF (GAUZE/BANDAGES/DRESSINGS) ×2 IMPLANT
BNDG ESMARK 6X9 LF (GAUZE/BANDAGES/DRESSINGS)
CHLORAPREP W/TINT 26 (MISCELLANEOUS) ×4 IMPLANT
COOLER ICEMAN CLASSIC (MISCELLANEOUS) ×2 IMPLANT
COVER MAYO STAND STRL (DRAPES) ×2 IMPLANT
COVER SURGICAL LIGHT HANDLE (MISCELLANEOUS) ×2 IMPLANT
CUFF TOURN SGL QUICK 34 (TOURNIQUET CUFF)
CUFF TOURN SGL QUICK 42 (TOURNIQUET CUFF) IMPLANT
CUFF TRNQT CYL 34X4.125X (TOURNIQUET CUFF) IMPLANT
DISSECTOR 4.0MM X 13CM (MISCELLANEOUS) IMPLANT
DRAPE ARTHROSCOPY W/POUCH 114 (DRAPES) ×2 IMPLANT
DRAPE INCISE IOBAN 66X45 STRL (DRAPES) ×2 IMPLANT
DRAPE OEC MINIVIEW 54X84 (DRAPES) IMPLANT
DRAPE SURG ORHT 6 SPLT 77X108 (DRAPES) ×2 IMPLANT
DRAPE U-SHAPE 47X51 STRL (DRAPES) ×2 IMPLANT
DRSG TEGADERM 4X4.75 (GAUZE/BANDAGES/DRESSINGS) ×8 IMPLANT
DRSG TELFA 3X8 NADH STRL (GAUZE/BANDAGES/DRESSINGS) ×4 IMPLANT
DW OUTFLOW CASSETTE/TUBE SET (MISCELLANEOUS) IMPLANT
ELECT REM PT RETURN 9FT ADLT (ELECTROSURGICAL) ×2
ELECTRODE REM PT RTRN 9FT ADLT (ELECTROSURGICAL) ×2 IMPLANT
EXCALIBUR 3.8MM X 13CM (MISCELLANEOUS) ×2 IMPLANT
GAUZE PAD ABD 8X10 STRL (GAUZE/BANDAGES/DRESSINGS) ×2 IMPLANT
GAUZE SPONGE 4X4 12PLY STRL (GAUZE/BANDAGES/DRESSINGS) IMPLANT
GAUZE SPONGE 4X4 12PLY STRL LF (GAUZE/BANDAGES/DRESSINGS) ×2 IMPLANT
GAUZE XEROFORM 1X8 LF (GAUZE/BANDAGES/DRESSINGS) ×4 IMPLANT
GLOVE BIO SURGEON STRL SZ7.5 (GLOVE) IMPLANT
GLOVE BIOGEL PI IND STRL 6.5 (GLOVE) ×2 IMPLANT
GLOVE BIOGEL PI IND STRL 8 (GLOVE) ×2 IMPLANT
GLOVE ECLIPSE 6.0 STRL STRAW (GLOVE) ×2 IMPLANT
GLOVE INDICATOR 8.0 STRL GRN (GLOVE) ×2 IMPLANT
GOWN STRL REUS W/ TWL LRG LVL3 (GOWN DISPOSABLE) ×6 IMPLANT
GOWN STRL REUS W/TWL LRG LVL3 (GOWN DISPOSABLE) ×6
GRAFT TISS 230-320 GRACILIS (Bone Implant) IMPLANT
GUIDEWIRE .062X6IN LONG (WIRE) IMPLANT
IMMOBILIZER KNEE 20 (SOFTGOODS) ×2
IMMOBILIZER KNEE 20 THIGH 36 (SOFTGOODS) IMPLANT
IMMOBILIZER KNEE 22 UNIV (SOFTGOODS) ×2 IMPLANT
KIT BASIN OR (CUSTOM PROCEDURE TRAY) ×2 IMPLANT
KIT KNEE FIBERTAK DISP (KITS) IMPLANT
KIT ROOT REPAIR MEINISCAL PEEK (Anchor) IMPLANT
KIT TURNOVER KIT B (KITS) ×2 IMPLANT
MANIFOLD NEPTUNE II (INSTRUMENTS) ×2 IMPLANT
MEINISCAL ROOT REPAIR KIT PEEK (Anchor) ×2 IMPLANT
NDL 18GX1X1/2 (RX/OR ONLY) (NEEDLE) ×2 IMPLANT
NDL HYPO 18GX1.5 BLUNT FILL (NEEDLE) ×2 IMPLANT
NEEDLE 18GX1X1/2 (RX/OR ONLY) (NEEDLE) ×2 IMPLANT
NEEDLE HYPO 18GX1.5 BLUNT FILL (NEEDLE) ×2 IMPLANT
NS IRRIG 1000ML POUR BTL (IV SOLUTION) ×2 IMPLANT
PACK ARTHROSCOPY DSU (CUSTOM PROCEDURE TRAY) ×2 IMPLANT
PAD ARMBOARD 7.5X6 YLW CONV (MISCELLANEOUS) ×4 IMPLANT
PAD CAST 4YDX4 CTTN HI CHSV (CAST SUPPLIES) ×2 IMPLANT
PAD COLD SHLDR WRAP-ON (PAD) IMPLANT
PADDING CAST COTTON 6X4 STRL (CAST SUPPLIES) ×4 IMPLANT
PENCIL BUTTON HOLSTER BLD 10FT (ELECTRODE) ×2 IMPLANT
PROBE APOLLO 90XL (SURGICAL WAND) IMPLANT
SCREW BONE 3.5X42MM CORTICAL (Screw) IMPLANT
SCREW CORT 3.5X40 LP ANKLE (Screw) IMPLANT
SPIKE FLUID TRANSFER (MISCELLANEOUS) ×2 IMPLANT
SPONGE T-LAP 18X18 ~~LOC~~+RFID (SPONGE) ×2 IMPLANT
SPONGE T-LAP 4X18 ~~LOC~~+RFID (SPONGE) ×4 IMPLANT
STRIP CLOSURE SKIN 1/2X4 (GAUZE/BANDAGES/DRESSINGS) ×4 IMPLANT
SUCTION TUBE FRAZIER 10FR DISP (SUCTIONS) ×2 IMPLANT
SUT ETHILON 3 0 PS 1 (SUTURE) ×4 IMPLANT
SUT FIBERWIRE #2 38 T-5 BLUE (SUTURE) ×2
SUT MNCRL AB 3-0 PS2 18 (SUTURE) ×2 IMPLANT
SUT VIC AB 0 CT1 27XBRD ANBCTR (SUTURE) ×2 IMPLANT
SUT VIC AB 2-0 CT1 TAPERPNT 27 (SUTURE) ×2 IMPLANT
SUT VIC AB CT1 27XBRD ANBCTRL (SUTURE) ×4
SUT VICRYL 0 UR6 27IN ABS (SUTURE) ×2 IMPLANT
SUTURE FIBERWR #2 38 T-5 BLUE (SUTURE) IMPLANT
SYR 30ML LL (SYRINGE) ×2 IMPLANT
SYR 3ML LL SCALE MARK (SYRINGE) ×2 IMPLANT
SYR BULB IRRIG 60ML STRL (SYRINGE) ×2 IMPLANT
SYR TB 1ML LUER SLIP (SYRINGE) ×2 IMPLANT
TENDON GRACILIS FROZEN (Bone Implant) ×2 IMPLANT
TENDON GRACILIS FROZEN 230-320 (Bone Implant) ×2 IMPLANT
TOWEL GREEN STERILE (TOWEL DISPOSABLE) ×2 IMPLANT
TOWEL GREEN STERILE FF (TOWEL DISPOSABLE) ×2 IMPLANT
TUBING ARTHROSCOPY IRRIG 16FT (MISCELLANEOUS) ×2 IMPLANT
UNDERPAD 30X36 HEAVY ABSORB (UNDERPADS AND DIAPERS) ×2 IMPLANT
WRAP KNEE MAXI GEL POST OP (GAUZE/BANDAGES/DRESSINGS) ×2 IMPLANT
YANKAUER SUCT BULB TIP NO VENT (SUCTIONS) ×2 IMPLANT

## 2023-06-16 NOTE — Brief Op Note (Signed)
   Brief Op Note  Date of Surgery: 06/16/2023  Preoperative Diagnosis: RIGHT KNEE PATELLAR MALALIGHMENT  Postoperative Diagnosis: same  Procedure: Procedure(s): RIGHT KNEE ARTHROSCOPY WITH MEDIAL PATELLAR FEMORAL LIGAMENT RECONSTRUCTION RIGHT KNEE TIBIAL TUBERLE OSTEOTOMY  Implants: Implant Name Type Inv. Item Serial No. Manufacturer Lot No. LRB No. Used Action  TENDON GRACILIS FROZEN - [id number] Bone Implant TENDON GRACILIS FROZEN [id number] [facility]  Right 1 Implanted  [facility] DBL 2.6 SLF-PNCH FIBERTAK - [id number] Anchor ANCH DBL 2.6 SLF-PNCH FIBERTAK  [employer] [id number] Right 1 Implanted  ANCH DBL 2.6 SLF-PNCH FIBERTAK - [id number] Anchor ANCH DBL 2.6 SLF-PNCH FIBERTAK  [employer] [id number] Right 1 Implanted  SCREW BONE 3.5X42MM CORTICAL - [id number] Screw SCREW BONE 3.5X42MM CORTICAL  [employer]  Right 2 Implanted  SCREW CORT 3.5X40 LP ANKLE - [id number] Screw SCREW CORT 3.5X40 LP ANKLE  [employer]  Right 1 Implanted  MEINISCAL ROOT REPAIR KIT PEEK - [id number] Anchor MEINISCAL ROOT REPAIR KIT PEEK  [employer] [id number] Right 1 Implanted    Surgeons: Surgeon(s): Huel Cote, MD  Anesthesia: Choice    Estimated Blood Loss: See anesthesia record  Complications: None  Condition to PACU: Stable  Benancio Deeds, MD 06/16/2023 4:05 PM

## 2023-06-16 NOTE — Interval H&P Note (Signed)
History and Physical Interval Note:  06/16/2023 11:34 AM  Martha Montgomery  has presented today for surgery, with the diagnosis of RIGHT KNEE PATELLAR MALALIGHMENT.  The various methods of treatment have been discussed with the patient and family. After consideration of risks, benefits and other options for treatment, the patient has consented to  Procedure(s): RIGHT KNEE ARTHROSCOPY WITH MEDIAL PATELLAR FEMORAL LIGAMENT RECONSTRUCTION (Right) RIGHT KNEE TIBIAL TUBERLE OSTEOTOMY (Right) as a surgical intervention.  The patient's history has been reviewed, patient examined, no change in status, stable for surgery.  I have reviewed the patient's chart and labs.  Questions were answered to the patient's satisfaction.     Huel Cote

## 2023-06-16 NOTE — Interval H&P Note (Signed)
History and Physical Interval Note:  06/16/2023 1:46 PM  Martha Montgomery  has presented today for surgery, with the diagnosis of RIGHT KNEE PATELLAR MALALIGHMENT.  The various methods of treatment have been discussed with the patient and family. After consideration of risks, benefits and other options for treatment, the patient has consented to  Procedure(s): RIGHT KNEE ARTHROSCOPY WITH MEDIAL PATELLAR FEMORAL LIGAMENT RECONSTRUCTION (Right) RIGHT KNEE TIBIAL TUBERLE OSTEOTOMY (Right) as a surgical intervention.  The patient's history has been reviewed, patient examined, no change in status, stable for surgery.  I have reviewed the patient's chart and labs.  Questions were answered to the patient's satisfaction.     Huel Cote

## 2023-06-16 NOTE — Anesthesia Procedure Notes (Signed)
Procedure Name: LMA Insertion Date/Time: 06/16/2023 2:04 PM  Performed by: Shary Decamp, CRNAPre-anesthesia Checklist: Patient identified, Emergency Drugs available, Suction available and Patient being monitored Patient Re-evaluated:Patient Re-evaluated prior to induction Oxygen Delivery Method: Circle system utilized Preoxygenation: Pre-oxygenation with 100% oxygen Induction Type: IV induction Ventilation: Mask ventilation without difficulty LMA: LMA flexible inserted LMA Size: 4.0 Number of attempts: 1 Placement Confirmation: positive ETCO2 and breath sounds checked- equal and bilateral Tube secured with: Tape Dental Injury: Teeth and Oropharynx as per pre-operative assessment

## 2023-06-16 NOTE — Anesthesia Procedure Notes (Signed)
Anesthesia Regional Block: Adductor canal block   Pre-Anesthetic Checklist: , timeout performed,  Correct Patient, Correct Site, Correct Laterality,  Correct Procedure, Correct Position, site marked,  Risks and benefits discussed,  Surgical consent,  Pre-op evaluation,  At surgeon's request and post-op pain management  Laterality: Right  Prep: chloraprep       Needles:  Injection technique: Single-shot  Needle Type: Echogenic Stimulator Needle     Needle Length: 9cm  Needle Gauge: 21     Additional Needles:   Procedures:,,,, ultrasound used (permanent image in chart),,    Narrative:  Start time: 06/16/2023 12:27 PM End time: 06/16/2023 12:30 PM Injection made incrementally with aspirations every 5 mL.  Performed by: Personally  Anesthesiologist: Linton Rump, MD  Additional Notes: Discussed risks and benefits of nerve block including, but not limited to, prolonged and/or permanent nerve injury involving sensory and/or motor function. Monitors were applied and a time-out was performed. The nerve and associated structures were visualized under ultrasound guidance. After negative aspiration, local anesthetic was slowly injected around the nerve. There was no evidence of high pressure during the procedure. There were no paresthesias. VSS remained stable and the patient tolerated the procedure well.

## 2023-06-16 NOTE — Anesthesia Postprocedure Evaluation (Signed)
Anesthesia Post Note  Patient: CAMBRIE HURRELL  Procedure(s) Performed: RIGHT KNEE ARTHROSCOPY WITH MEDIAL PATELLAR FEMORAL LIGAMENT RECONSTRUCTION (Right: Knee) RIGHT KNEE TIBIAL TUBERLE OSTEOTOMY (Right)     Patient location during evaluation: PACU Anesthesia Type: General Level of consciousness: awake Pain management: pain level controlled Vital Signs Assessment: post-procedure vital signs reviewed and stable Respiratory status: spontaneous breathing, nonlabored ventilation and respiratory function stable Cardiovascular status: blood pressure returned to baseline and stable Postop Assessment: no apparent nausea or vomiting Anesthetic complications: no   No notable events documented.  Last Vitals:  Vitals:   06/16/23 1615 06/16/23 1630  BP: (!) 133/91 (!) 130/95  Pulse: 89 89  Resp: 19 16  Temp:    SpO2: 96% 98%    Last Pain:  Vitals:   06/16/23 1632  TempSrc:   PainSc: 7       LLE Sensation: Full sensation (06/16/23 1630)   RLE Sensation: Full sensation (06/16/23 1630)      Linton Rump

## 2023-06-16 NOTE — Op Note (Signed)
Date of Surgery: 06/16/2023  INDICATIONS: [name] is a 42 y.o.-year-old female with right knee recurrent instability.  The risk and benefits of the procedure were discussed in detail and documented in the pre-operative evaluation.   PREOPERATIVE DIAGNOSIS: 1. Right knee patella instability  POSTOPERATIVE DIAGNOSIS: Same.  PROCEDURE: 1. Right knee tibial tubercle osteotomy 2. Right knee medial patellofemoral ligament reconstruction 3. Right knee diagnostic arthroscopy.  SURGEON: Benancio Deeds MD  ASSISTANT: Kerby Less, ATC  ANESTHESIA:  general plus adductor canal block  IV FLUIDS AND URINE: See anesthesia record.  ANTIBIOTICS: Ancef  ESTIMATED BLOOD LOSS: 50 mL.  IMPLANTS:  Implant Name Type Inv. Item Serial No. Manufacturer Lot No. LRB No. Used Action  TENDON GRACILIS FROZEN - [id number] Bone Implant TENDON GRACILIS FROZEN [id number] [employer]  Right 1 Implanted  [facility] DBL 2.6 SLF-PNCH FIBERTAK - [id number] Anchor ANCH DBL 2.6 SLF-PNCH FIBERTAK  [employer] [id number] Right 1 Implanted  ANCH DBL 2.6 SLF-PNCH FIBERTAK - [id number] Anchor ANCH DBL 2.6 SLF-PNCH FIBERTAK  [employer] [id number] Right 1 Implanted  SCREW BONE 3.5X42MM CORTICAL - [id number] Screw SCREW BONE 3.5X42MM CORTICAL  [employer]  Right 2 Implanted  SCREW CORT 3.5X40 LP ANKLE - [id number] Screw SCREW CORT 3.5X40 LP ANKLE  [employer]  Right 1 Implanted  MEINISCAL ROOT REPAIR KIT PEEK - [id number] Anchor MEINISCAL ROOT REPAIR KIT PEEK  [employer] [id number] Right 1 Implanted    DRAINS: None  CULTURES: None  COMPLICATIONS: none  DESCRIPTION OF PROCEDURE:   Examination under anesthesia: A careful examination under anesthesia was performed.  Knee ROM motion was: -3-135 Lachman: Normal Pivot Shift: Normal Posterior drawer: normal.   Varus stability in full extension: normal.   Varus stability in 30 degrees of flexion: normal.  Valgus stability in full extension: normal.   Valgus stability  in 30 degrees of flexion: normal.  Posterolateral drawer: normal   Intra-operative findings: A thorough arthroscopic examination of the knee was performed.  The findings are: 1. Suprapatellar pouch: Normal 2. Undersurface of median ridge: Normal 3. Medial patellar facet: Normal 4. Lateral patellar facet: Grade 2 chondral thinning 5. Trochlea: Shallow 6. Lateral gutter/popliteus tendon: Normal 7. Hoffa's fat pad: Normal 8. Medial gutter/plica: Normal 9. ACL: Normal 10. PCL: Normal 11. Medial meniscus: Normal 12. Medial compartment cartilage: Normal 13. Lateral meniscus: Normal 14. Lateral compartment cartilage: Normal  I identified the patient in the pre-operative holding area.  I marked the operative knee with my initials. I reviewed the risks and benefits of the proposed surgical intervention and the patient wished to proceed.  Anesthesia performed a peripheral nerve block.  Patient was subsequently taken back to the operating room.  The patient was transferred to the operative suite and placed in the supine position with all bony prominences padded.     SCDs were placed on the non-operative lower extremity. Appropriate antibiotics was administered within 1 hour before incision. The operative lower extremity was then prepped and draped in standard fashion. A time out was performed confirming the correct extremity, correct patient and correct procedure.    A standard anterolateral portal was made with an 11 blade.  Arthroscopy ensued with the above findings.   A 10cm longitudinal incision medial to the tibial tubercle was made and full thickness medial and lateral fasciocutaneous flaps were elevated. Small lateral and medial parapatellar arthrotomies were made and an Army-Navy retractor was placed underneath the patellar tendon for the remainder of the osteotomy.  I then elevated the anterior compartment  musculature off the anterolateral tibia in a subperiosteal fashion.  This allowed 2  blunt Hohmann retractors to be placed around the posterolateral tibia to protect the neurovascular structures during the osteotomy.  At that point, an osteotomy was planned along the medial tibia in an oblique fashion from  posterior proximal to distal anterior, and the anticipated path of the osteotomy was marked with a Bovie in the periosteum.  Then, 2 parallel 0.062 K-wires were used to plan the trajectory of the osteotomy, such that through it exited a safe distance from the posterior tibial cortex.  Once these were placed, an oscillating saw was used to create the osteotomy cut in an oblique fashion at 30 degrees. Once this saw blade cut was performed and a distal cortical hinge was disrupted and the distal 3 mm of the bone block was removed in order to distalize the block, an osteotome was used to make the oblique cuts as well as the transverse cut proximal to the tibial tubercle while protecting the patellar tendon. The osteotomy shingle was then anteromedialized 5 mm, based on preoperative planning and approved distal approximately 8 mm.  This was temporarily fixed with a bicortical 0.062 K-wire.  Then, under direct fluoroscopic visualization, fixation was achieved with 3 3.5 mm solid cortical screws using lag by technique. This afforded appropriate fixation of the tibial tubercle osteotomy.  Final position of the screws and the osteotomy was confirmed on AP and lateral fluoroscopic images and was found to be in excellent position.   At this point, I directed my attention to a lateral retinacular lengthening with the knee in 60 degrees of flexion.  An longitudinal incision, lateral to the lateral edge of the patella was made along the length between the superior and inferior poles of the patella. Bovie was used to incise the superficial oblique fibers of the lateral retinaculum from the level of the previous lateral arthrotomy to the level of the proximal pole of the patella.  This superficial layer was  then elevated posteriorly as a flap.  Then, the deeper capsular layer was incised 2 cm posterior to the edge of the patella to allow for a 2 cm lengthening of the lateral retinaculum.  At this point, the anterior flap of the deep capsular layer was sewn to the posterior flap of the superficial oblique layer using #0 Vicryl figure-of-eight stitches in a watertight fashion, thereby lengthening the lateral retinaculum by 2 cm and allowing her patellar tilt to be neutralized.   At this point, I directed my attention to medial patellofemoral ligament reconstruction.  On the back table, a semitendinosus allograft was prepared in the standard fashion.     At this point, a 15 blade was used to dissect layers 1 and 2 off the medial patella, and I then bluntly developed the interval between layers 2 and 3.  This was taken bluntly all the way to the level of the medial epicondyle.   At this point, electrocautery was used to remove soft tissue from the medial aspect of the patella from the proximal pole of the equator. A small osteophyte was encountered there, likely from a prior avulsion fracture.  This was removed with a rongeur.  The medial edge of the patella was debrided with a rongeur to create a bleeding trough of bone to accommodate the graft.  Fluoroscopy was then used to confirm the position of 2 anticipated anchors on the medial patella.  One was placed at the equator, the other halfway between the equator  and the superior pole in the native footprint of the MPFL.  These were placed in standard fashion and were both Arthrex fibertak anchors.  Excellent purchase was achieved in the patella.  Graft was passed through these 2 anchors and 1 limb of each anchor was used in an internal brace fashion.     At this point, the medial epicondylar incision was localized with fluoroscopy and created with a 15 blade.  Sharp dissection was carried down to the fascia.  The fascia was then sharply incised and a Kelly clamp  was placed between layers 2 and 3, from the level of the patella, and brought out the medial epicondylar incision, guaranteeing a good soft tissue tunnel for passage of the graft.  The limbs of the graft were then passed as well as the internal brace sutures.  These were then placed into a swivel lock in an onlay fashion after Shottle's point was localized with lateral fluoroscopy.  This point the patella was nicely tensioned.  Final fluoroscopy's were taken.   That concluded the case.  Skin was closed with 0 Vicryl, 2-0 Vicryl and 3-0 nylon. Xeroform gauze, gauze, Tegaderm, Iceman and brace were applied.  Instrument, sponge, and needle counts were correct prior to wound closure and at the conclusion of the case.  The patient was taken to the PACU without complication    POSTOPERATIVE PLAN: She will be partial weightbearing on the right leg for 2 weeks.  She will follow the tibial tubercle osteotomy protocol.  I will see her back in 2 weeks for suture removal.  She will begin physical therapy immediately postop.  She will be in a straight brace for the first 2 weeks  Benancio Deeds, MD 4:05 PM

## 2023-06-16 NOTE — Discharge Instructions (Signed)
Discharge Instructions    Attending Surgeon: Huel Cote, MD Office Phone Number: [phone]   Diagnosis and Procedures:    Surgeries Performed: Right knee MPFL reconstruction with tibial tubercle osteotomy  Discharge Plan:    Diet: Resume usual diet. Begin with light or bland foods.  Drink plenty of fluids.  Activity:  Partial weight bearing in knee immobilizer for 2 weeks. You are advised to go home directly from the hospital or surgical center. Restrict your activities.  GENERAL INSTRUCTIONS: 1.  Please apply ice to your wound to help with swelling and inflammation. This will improve your comfort and your overall recovery following surgery.     2. Please call Dr. Serena Croissant office at [phone] with questions Monday-Friday during business hours. If no one answers, please leave a message and someone should get back to the patient within 24 hours. For emergencies please call 911 or proceed to the emergency room.   3. Patient to notify surgical team if experiences any of the following: Bowel/Bladder dysfunction, uncontrolled pain, nerve/muscle weakness, incision with increased drainage or redness, nausea/vomiting and Fever greater than 101.0 F.  Be alert for signs of infection including redness, streaking, odor, fever or chills. Be alert for excessive pain or bleeding and notify your surgeon immediately.  WOUND INSTRUCTIONS:   Leave your dressing, cast, or splint in place until your post operative visit.  Keep it clean and dry.  Always keep the incision clean and dry until the staples/sutures are removed. If there is no drainage from the incision you should keep it open to air. If there is drainage from the incision you must keep it covered at all times until the drainage stops  Do not soak in a bath tub, hot tub, pool, lake or other body of water until 21 days after your surgery and your incision is completely dry and healed.  If you have removable sutures (or  staples) they must be removed 10-14 days (unless otherwise instructed) from the day of your surgery.     1)  Elevate the extremity as much as possible.  2)  Keep the dressing clean and dry.  3)  Please call us if the dressing becomes wet or dirty.  4)  If you are experiencing worsening pain or worsening swelling, please call.     MEDICATIONS: Resume all previous home medications at the previous prescribed dose and frequency unless otherwise noted Start taking the  pain medications on an as-needed basis as prescribed  Please taper down pain medication over the next week following surgery.  Ideally you should not require a refill of any narcotic pain medication.  Take pain medication with food to minimize nausea. In addition to the prescribed pain medication, you may take over-the-counter pain relievers such as Tylenol.  Do NOT take additional tylenol if your pain medication already has tylenol in it.  Aspirin 325mg  daily per instructions on bottle. Narcotic policy: Per [facility] clinic policy, our goal is ensure optimal postoperative pain control with a multimodal pain management strategy. For all [facility] patients, our goal is to wean post-operative narcotic medications by 6 weeks post-operatively, and many times sooner. If this is not possible due to utilization of pain medication prior to surgery, your [facility] doctor will support your acute post-operative pain control for the first 6 weeks postoperatively, with a plan to transition you back to your primary pain team following that. Cyndia Skeeters will work to ensure a Therapist, occupational.       FOLLOWUP INSTRUCTIONS:  1. Follow up at the Physical Therapy Clinic 3-4 days following surgery. This appointment should be scheduled unless other arrangements have been made.The Physical Therapy scheduling number is [phone] if an appointment has not already been arranged.  2. Contact Dr. Serena Croissant office during office hours at [phone] or the  practice after hours line at [phone] for non-emergencies. For medical emergencies call 911.   Discharge Location: Home

## 2023-06-16 NOTE — Transfer of Care (Signed)
Immediate Anesthesia Transfer of Care Note  Patient: Martha Montgomery  Procedure(s) Performed: RIGHT KNEE ARTHROSCOPY WITH MEDIAL PATELLAR FEMORAL LIGAMENT RECONSTRUCTION (Right: Knee) RIGHT KNEE TIBIAL TUBERLE OSTEOTOMY (Right)  Patient Location: PACU  Anesthesia Type:General and Regional  Level of Consciousness: awake  Airway & Oxygen Therapy: Patient Spontanous Breathing and Patient connected to face mask oxygen  Post-op Assessment: Report given to RN and Post -op Vital signs reviewed and stable  Post vital signs: Reviewed and stable  Last Vitals:  Vitals Value Taken Time  BP 128/88 06/16/23 1601  Temp 36.6 C 06/16/23 1600  Pulse 90 06/16/23 1605  Resp 12 06/16/23 1605  SpO2 100 % 06/16/23 1605  Vitals shown include unfiled device data.  Last Pain:  Vitals:   06/16/23 1128  TempSrc:   PainSc: 2          Complications: No notable events documented.
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
## 2023-06-17 ENCOUNTER — Other Ambulatory Visit: Payer: Self-pay | Admitting: Family Medicine

## 2023-06-17 ENCOUNTER — Encounter (HOSPITAL_COMMUNITY): Payer: Self-pay | Admitting: Orthopaedic Surgery

## 2023-06-17 ENCOUNTER — Ambulatory Visit: Payer: Medicare Other | Admitting: Family Medicine

## 2023-06-29 ENCOUNTER — Encounter (HOSPITAL_BASED_OUTPATIENT_CLINIC_OR_DEPARTMENT_OTHER): Payer: Medicare Other | Admitting: Orthopaedic Surgery

## 2023-06-29 NOTE — Therapy (Incomplete)
OUTPATIENT PHYSICAL THERAPY LOWER EXTREMITY EVALUATION   Patient Name: Martha Montgomery MRN: [id number] DOB:[birth year], 42 y.o., female Today's Date: 06/30/2023  END OF SESSION:  PT End of Session - 06/30/23 1444     Visit Number 1    Number of Visits 16    Date for PT Re-Evaluation 08/18/23    Authorization Type [employer] Medicare    PT Start Time 1440    PT Stop Time 1515    PT Time Calculation (min) 35 min    Activity Tolerance Patient tolerated treatment well    Behavior During Therapy WFL for tasks assessed/performed             Past Medical History:  Diagnosis Date   Anxiety    Anxiety disorder    Arthritis    "knees, joints, hands, toes" (09/28/2013)   Depression    Depression, major, single episode, in partial remission (HCC) 10/18/2008   Qualifier: Diagnosis of  By: Lodema Hong MD, Margaret  PHQ 9 score of 7 in 10/2017, not suicidal or homicidal, interested in telepsych however, score of 16 in  02/2018, not suicidal or homicidal, start medication and refer to telepsych Score of 7 in 07/2018    Fibromyalgia    Gastritis nov. 2011   EGD by Dr. Darrick Penna, negative h pylori   GERD (gastroesophageal reflux disease)    Gout    Hypertension x 6 years    Hypothyroidism    Migraines    "15/month average" (09/28/2013)   Obesity (BMI 30.0-34.9)    Spinal stenosis    Thyroid goiter    Past Surgical History:  Procedure Laterality Date   BIOPSY  10/20/2017   Procedure: BIOPSY;  Surgeon: West Bali, MD;  Location: [facility];  Service: Endoscopy;;  gastric duodenal esophagus   CESAREAN SECTION  2008   CHONDROPLASTY  04/02/2012   Procedure: CHONDROPLASTY;  Surgeon: Vickki Hearing, MD;  Location: [facility];  Service: Orthopedics;  Laterality: Left;  of left patella   COLONOSCOPY  11/2010   Dr. Darrick Penna: normal colon, small internal hemorrhoids   COLONOSCOPY WITH PROPOFOL N/A 10/22/2017   Surgeon: West Bali, MD; significant looping of the colon, internal hemorrhoids,  otherwise normal exam.  Recommended 10-year surveillance.   COLONOSCOPY WITH PROPOFOL N/A 10/20/2017   Procedure: COLONOSCOPY WITH PROPOFOL;  Surgeon: West Bali, MD;  Location: [facility];  Service: Endoscopy;  Laterality: N/A;  11:00am   ESOPHAGOGASTRODUODENOSCOPY (EGD) WITH PROPOFOL N/A 10/20/2017   Surgeon: West Bali, MD; no endoscopic abnormality to explain dysphagia s/p empiric dilation, gastritis.  Esophageal biopsies consistent with reflux, gastric biopsies with gastric antral and oxyntic mucosa negative for H. pylori, duodenal biopsies benign.   INCISION AND DRAINAGE ABSCESS  1997; 2005   "under right buttocks; under left arm"   KNEE ARTHROSCOPY Left 2021   KNEE ARTHROSCOPY WITH MEDIAL PATELLAR FEMORAL LIGAMENT RECONSTRUCTION Right 06/16/2023   Procedure: RIGHT KNEE ARTHROSCOPY WITH MEDIAL PATELLAR FEMORAL LIGAMENT RECONSTRUCTION;  Surgeon: Huel Cote, MD;  Location: [facility];  Service: Orthopedics;  Laterality: Right;   SAVORY DILATION N/A 10/20/2017   Procedure: SAVORY DILATION;  Surgeon: West Bali, MD;  Location: [facility];  Service: Endoscopy;  Laterality: N/A;   THYROIDECTOMY N/A 09/28/2013   Procedure: TOTAL THYROIDECTOMY;  Surgeon: Darletta Moll, MD;  Location[facility];  Service: ENT;  Laterality: N/A;   TIBIA OSTEOTOMY Right 06/16/2023   Procedure: RIGHT KNEE TIBIAL TUBERLE OSTEOTOMY;  Surgeon: Huel Cote, MD;  Location:  [facility] OR;  Service: Orthopedics;  Laterality: Right;   UPPER GI ENDOSCOPY  2011   EGD: gastritis, no h.pylori   Patient Active Problem List   Diagnosis Date Noted   Patellar instability of both knees 06/16/2023   GAD (generalized anxiety disorder) 04/20/2023   Mild depression 04/20/2023   Depression, major, single episode, mild (HCC) 04/20/2023   Decreased vision 04/20/2023   Bladder distension 04/20/2023   Encounter for surveillance of injectable contraceptive 11/07/2022   Encounter for Medicare annual examination with abnormal  findings 08/07/2022   Hyperlipidemia 06/08/2022   Worm infestation 05/12/2022   Generalized joint pain 03/26/2022   Twitching 09/03/2021   Toxic effect of kerosene 09/03/2021   Hypertension    Finger pain, right 07/31/2020   Bilateral groin pain 07/31/2020   Dental caries 04/24/2019   Morbid obesity due to excess calories (HCC) 01/08/2019   Multiple benign lumps of breast 08/17/2018   Hemorrhoids 05/19/2018   Encounter for contraceptive management 03/20/2018   Low vitamin D level 11/30/2017   Bloating 03/05/2017   Bilateral knee pain 01/05/2017   Amenorrhea 01/05/2017   Bilateral hip pain 07/22/2016   Post-surgical hypothyroidism 06/02/2014   Knee pain 06/02/2012   Patellar malalignment syndrome 03/09/2012   IBS (irritable bowel syndrome) 08/29/2011   GERD (gastroesophageal reflux disease) 08/29/2011   Back pain 09/24/2010   Migraine headache 04/02/2010   Constipation 09/12/2009   Allergic rhinitis 06/16/2009    PCP: Syliva Overman  REFERRING PROVIDER: Huel Cote, MD  REFERRING DIAG: M23.90 (ICD-10-CM) - Patellar malalignment syndrome, unspecified laterality  THERAPY DIAG:  Patellar instability of right knee  Difficulty in walking, not elsewhere classified  Knee stiffness, right  Rationale for Evaluation and Treatment: Rehabilitation  ONSET DATE: 06/16/2023  SUBJECTIVE:   SUBJECTIVE STATEMENT: S/p Medial Patella Femoral ligament reconstruction per Dr. Steward Drone 06/16/23; right knee instability; "tearing sound" with steps especially She jarred her knee a couple times; "overstretched the knee" and is hearing some "cracking" but had her immobilizer on both times.   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  Patient arrives in immobilizer with crutches;  states she is "supposed to get a new brace today" but PT informs her this is likely to be done at her MD office; patient instructed to call her surgeon's office regarding follow up  appointment for brace an to get sutures out.    PERTINENT HISTORY: Have had 2 surgeries on left knee already for instability Pinched nerve in back running down leg PAIN:  Are you having pain? Yes: NPRS scale: 5/10 Pain location: right knee, anterior knee Pain description: burning, constant ache; sometimes sharp pain; spasm  Aggravating factors: jarring  Relieving factors: pain meds, ice  PRECAUTIONS: Knee Right knee  RED FLAGS: None noted  WEIGHT BEARING RESTRICTIONS: Yes heel touch only   FALLS:  Has patient fallen in last 6 months? 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  DIAGNOSTIC FINDINGS:   PATIENT SURVEYS:  FOTO 61  COGNITION: Overall cognitive status: Within  functional limits for tasks assessed     SENSATION: WFL  EDEMA:  Normal for this time s/p   POSTURE: rounded shoulders, forward head, and weight shift left  PALPATION: General tenderness right knee   LOWER EXTREMITY ROM:  Active ROM Right eval Left eval  Hip flexion    Hip extension    Hip abduction    Hip adduction    Hip internal rotation    Hip external rotation    Knee flexion    Knee extension -12 hyperextends  Ankle dorsiflexion    Ankle plantarflexion    Ankle inversion    Ankle eversion     (Blank rows = not tested)  LOWER EXTREMITY MMT:  MMT Right eval Left eval  Hip flexion    Hip extension    Hip abduction    Hip adduction    Hip internal rotation    Hip external  rotation    Knee flexion    Knee extension Poor quad set   Ankle dorsiflexion    Ankle plantarflexion    Ankle inversion    Ankle eversion     (Blank rows = not tested)  FUNCTIONAL TESTS:  30 seconds chair stand test x 1  GAIT: Distance walked: 60 ft in clinic Assistive device utilized: Crutches Level of assistance: SBA Comments: heel touch weightbearing right leg   TODAY'S TREATMENT:                                                                                                                              DATE: 06/30/23 physical therapy evaluation and HEP instruction    PATIENT EDUCATION:  Education details: Patient educated on exam findings, POC, scope of PT, HEP, and what to expect visit. Person educated: Patient Education method: Explanation, Demonstration, and Handouts Education comprehension: verbalized understanding, returned demonstration, verbal cues required, and tactile cues required  HOME EXERCISE PROGRAM: Access Code: [email or web address] URL: [email or web address] Date: 06/30/2023 Prepared by: [facility]  Exercises - Supine Quad Set  - 2 x daily - 7 x weekly - 1 sets - 10 reps - 5 sec hold - gentle 4 way patellar glides  - 2 x daily - 7 x weekly - 1 sets - 10 reps - Supine Ankle Pumps  - 2 x daily - 7 x weekly - 2 sets - 10 reps - Seated Ankle Pumps  - 2 x daily - 7 x weekly - 2 sets - 10 reps - Supine Active Straight Leg Raise  - 2 x daily - 7 x weekly - 1 sets - 10 reps  ASSESSMENT:  CLINICAL IMPRESSION: Patient is a 42 y.o. female who was seen today for physical therapy evaluation and treatment for s/p M23.90 (ICD-10-CM) - Patellar malalignment syndrome, unspecified laterality.  Patient demonstrates muscle weakness, reduced ROM, and fascial restrictions which are likely contributing to symptoms of pain and are negatively impacting patient ability to perform ADLs and functional mobility tasks. Patient will benefit  from skilled physical therapy services  to address these deficits to reduce pain and improve level of function with ADLs and functional mobility tasks.   OBJECTIVE IMPAIRMENTS: Abnormal gait, decreased activity tolerance, decreased endurance, decreased knowledge of use of DME, decreased mobility, difficulty walking, decreased ROM, decreased strength, increased edema, increased fascial restrictions, impaired perceived functional ability, impaired flexibility, and pain.   ACTIVITY LIMITATIONS: lifting, bending, sitting, standing, squatting, sleeping, stairs, transfers, bed mobility, bathing, toileting, dressing, locomotion level, and caring for others  PARTICIPATION LIMITATIONS: meal prep, cleaning, laundry, driving, shopping, community activity, occupation, and church  PERSONAL FACTORS:  back pain and left knee pain  are also affecting patient's functional outcome.   REHAB POTENTIAL: Good  CLINICAL DECISION MAKING: Stable/uncomplicated  EVALUATION COMPLEXITY: Low   GOALS: Goals reviewed with patient? No  SHORT TERM GOALS: Target date: 07/28/2023 patient will be independent with initial HEP  Baseline: Goal status: INITIAL  2.  Patient will improve right knee mobility to 0 to 45 degrees to promote progression toward normalizing gait pattern Baseline:  Goal status: INITIAL    LONG TERM GOALS: Target date: 08/18/2023  Patient will improve FOTO score by 10 points to demonstrate improved perceived functional mobility  Baseline: 29 Goal status: INITIAL  2.  Patient will ambulate without AD and minimal gait deviation household and short community distances to safely grocery shop Baseline:  Goal status: INITIAL  3.  Patient will increase right knee mobility to 0 to 120 to promote normal navigation of steps; step over step pattern  Baseline: see above Goal status: INITIAL  4.  Patient will increase right  leg MMTs to 4+ to 5/5 without pain to promote return to ambulation community distances with minimal deviation.  (Goal Date 12 weeks out per protocol)  Baseline: see above Goal status: INITIAL  PLAN:  PT FREQUENCY: 2x/week  PT DURATION: 8 weeks  PLANNED INTERVENTIONS: 97164- PT Re-evaluation, 97110-Therapeutic exercises, 97530- Therapeutic activity, 97112- Neuromuscular re-education, 97535- Self Care, 60454- Manual therapy, [phone]- Gait training, [phone]- Orthotic Fit/training, [phone]- Canalith repositioning, [id number]- Aquatic Therapy, [phone]- Splinting, Patient/Family education, Balance training, Stair training, Taping, Dry Needling, Joint mobilization, Joint manipulation, Spinal manipulation, Spinal mobilization, Scar mobilization, and DME instructions.   PLAN FOR NEXT SESSION: Review HEP and goals; follow up if patient called MD regarding new brace and suture removal; progress per prototcol;    3:25 PM, 06/30/23 Khing Belcher Small Babygirl Trager MPT [facility] [place] [phone] [phone]   Managed Medicaid Authorization Request; [facility] Medicare  Visit Dx Codes: M 25.361, R 26.2, M25.661  Functional Tool Score: FOTO 29  For all possible CPT codes, reference the Planned Interventions line above.     Check all conditions that are expected to impact treatment: {Conditions expected to impact treatment:None of these apply   If treatment provided at initial evaluation, no treatment charged due to lack of authorization.

## 2023-06-30 ENCOUNTER — Ambulatory Visit (HOSPITAL_COMMUNITY): Payer: Medicare Other | Attending: Orthopaedic Surgery

## 2023-06-30 ENCOUNTER — Other Ambulatory Visit: Payer: Self-pay

## 2023-06-30 DIAGNOSIS — R262 Difficulty in walking, not elsewhere classified: Secondary | ICD-10-CM | POA: Diagnosis not present

## 2023-06-30 DIAGNOSIS — M25661 Stiffness of right knee, not elsewhere classified: Secondary | ICD-10-CM | POA: Insufficient documentation

## 2023-06-30 DIAGNOSIS — M2390 Unspecified internal derangement of unspecified knee: Secondary | ICD-10-CM | POA: Insufficient documentation

## 2023-06-30 DIAGNOSIS — M25361 Other instability, right knee: Secondary | ICD-10-CM | POA: Insufficient documentation

## 2023-07-01 ENCOUNTER — Telehealth (HOSPITAL_BASED_OUTPATIENT_CLINIC_OR_DEPARTMENT_OTHER): Payer: Self-pay | Admitting: Orthopaedic Surgery

## 2023-07-01 NOTE — Telephone Encounter (Signed)
Patient would like a refill on meds for oxycodone

## 2023-07-01 NOTE — Telephone Encounter (Signed)
Called patient this morning to make her aware of appointment on 06/30/23 she was aware and time was fine for her but her PT thought was a little far out offered  her an appointment with Dr B, PA she said the appointment she had was fine with her.

## 2023-07-03 ENCOUNTER — Telehealth: Payer: Self-pay | Admitting: Orthopaedic Surgery

## 2023-07-03 ENCOUNTER — Other Ambulatory Visit (HOSPITAL_BASED_OUTPATIENT_CLINIC_OR_DEPARTMENT_OTHER): Payer: Self-pay | Admitting: Student

## 2023-07-03 ENCOUNTER — Encounter (HOSPITAL_BASED_OUTPATIENT_CLINIC_OR_DEPARTMENT_OTHER): Payer: Medicare Other | Admitting: Orthopaedic Surgery

## 2023-07-03 MED ORDER — OXYCODONE HCL 5 MG PO TABS: 5.0000 mg | ORAL_TABLET | ORAL | 0 refills | Status: AC | PRN

## 2023-07-03 NOTE — Telephone Encounter (Signed)
Sent refill for a few more days to pharmacy.  Notified patient over the phone.

## 2023-07-03 NOTE — Telephone Encounter (Signed)
pt called in 3rd attempt to get her pain meds sent in she called on the 4th and hasnt heard anything yet Pt requesting Oxycodone

## 2023-07-06 ENCOUNTER — Telehealth: Payer: Medicare Other | Admitting: Family Medicine

## 2023-07-06 ENCOUNTER — Ambulatory Visit: Payer: Self-pay | Admitting: Family Medicine

## 2023-07-06 ENCOUNTER — Encounter: Payer: Self-pay | Admitting: Family Medicine

## 2023-07-06 DIAGNOSIS — K047 Periapical abscess without sinus: Secondary | ICD-10-CM | POA: Diagnosis not present

## 2023-07-06 MED ORDER — PENICILLIN V POTASSIUM 500 MG PO TABS: 500.0000 mg | ORAL_TABLET | Freq: Three times a day (TID) | ORAL | 0 refills | Status: AC

## 2023-07-06 NOTE — Progress Notes (Signed)
Virtual Visit Consent   [name], you are scheduled for a virtual visit with a [facility] provider today. Just as with appointments in the office, your consent must be obtained to participate. Your consent will be active for this visit and any virtual visit you may have with one of our providers in the next 365 days. If you have a MyChart account, a copy of this consent can be sent to you electronically.  As this is a virtual visit, video technology does not allow for your provider to perform a traditional examination. This may limit your provider's ability to fully assess your condition. If your provider identifies any concerns that need to be evaluated in person or the need to arrange testing (such as labs, EKG, etc.), we will make arrangements to do so. Although advances in technology are sophisticated, we cannot ensure that it will always work on either your end or our end. If the connection with a video visit is poor, the visit may have to be switched to a telephone visit. With either a video or telephone visit, we are not always able to ensure that we have a secure connection.  By engaging in this virtual visit, you consent to the provision of healthcare and authorize for your insurance to be billed (if applicable) for the services provided during this visit. Depending on your insurance coverage, you may receive a charge related to this service.  I need to obtain your verbal consent now. Are you willing to proceed with your visit today? Martha Montgomery has provided verbal consent on 07/06/2023 for a virtual visit (video or telephone). Freddy Finner, NP  Date: 07/06/2023 2:47 PM  Virtual Visit via Video Note   I, Freddy Finner, connected with  Martha Montgomery  ([id number], 41-Jun-1982) on 07/06/23 at  2:45 PM EST by a video-enabled telemedicine application and verified that I am speaking with the correct person using two identifiers.  Location: Patient: Virtual Visit Location Patient:  Home Provider: Virtual Visit Location Provider: Home Office   I discussed the limitations of evaluation and management by telemedicine and the availability of in person appointments. The patient expressed understanding and agreed to proceed.    History of Present Illness: Martha Montgomery is a 42 y.o. who identifies as a female who was assigned female at birth, and is being seen today for dental pain  Onset was about a week ago- worse in last two days Associated symptoms are pain is in the upper left- molar area- there was a filling in that tooth, but it seems that there is more decay on the tooth closer to gum line. Chewing discomfort, No pain in neck. Modifying factors are Anbesol (reports this is making it feel worse), salt water gargles,  Denies shortness of breath, fevers, chills, swallowing, no drainage, no swelling  Problems:  Patient Active Problem List   Diagnosis Date Noted   Patellar instability of both knees 06/16/2023   GAD (generalized anxiety disorder) 04/20/2023   Mild depression 04/20/2023   Depression, major, single episode, mild (HCC) 04/20/2023   Decreased vision 04/20/2023   Bladder distension 04/20/2023   Encounter for surveillance of injectable contraceptive 11/07/2022   Encounter for Medicare annual examination with abnormal findings 08/07/2022   Hyperlipidemia 06/08/2022   Worm infestation 05/12/2022   Generalized joint pain 03/26/2022   Twitching 09/03/2021   Toxic effect of kerosene 09/03/2021   Hypertension    Finger pain, right 07/31/2020   Bilateral groin pain 07/31/2020  Dental caries 04/24/2019   Morbid obesity due to excess calories (HCC) 01/08/2019   Multiple benign lumps of breast 08/17/2018   Hemorrhoids 05/19/2018   Encounter for contraceptive management 03/20/2018   Low vitamin D level 11/30/2017   Bloating 03/05/2017   Bilateral knee pain 01/05/2017   Amenorrhea 01/05/2017   Bilateral hip pain 07/22/2016   Post-surgical hypothyroidism  06/02/2014   Knee pain 06/02/2012   Patellar malalignment syndrome 03/09/2012   IBS (irritable bowel syndrome) 08/29/2011   GERD (gastroesophageal reflux disease) 08/29/2011   Back pain 09/24/2010   Migraine headache 04/02/2010   Constipation 09/12/2009   Allergic rhinitis 06/16/2009    Allergies:  Allergies  Allergen Reactions   Aspirin     Other reaction(s): Bleeding (intolerance) Rectal bleeding   Codeine Itching   Diclofenac     gastritis   Ibuprofen Other (See Comments)    Stomach pain   Metronidazole     Patient cannot remember   Nsaids    Soybean-Containing Drug Products Other (See Comments)    Tongue swells a little and feels "rug burned".   Medications:  Current Outpatient Medications:    aspirin EC 325 MG tablet, Take 1 tablet (325 mg total) by mouth daily., Disp: 30 tablet, Rfl: 0   carboxymethylcellulose (REFRESH PLUS) 0.5 % SOLN, Place 1 drop into both eyes 3 (three) times daily as needed (dry eyes)., Disp: , Rfl:    Cholecalciferol (VITAMIN D3) 25 MCG (1000 UT) CAPS, Take 1 capsule (1,000 Units total) by mouth daily. (Patient not taking: Reported on 06/04/2023), Disp: 90 capsule, Rfl: 1   Cyanocobalamin (B-12 PO), Take 1 tablet by mouth daily., Disp: , Rfl:    diphenhydrAMINE (BENADRYL) 25 MG tablet, Take 25 mg by mouth daily., Disp: , Rfl:    EPINEPHrine 0.3 mg/0.3 mL IJ SOAJ injection, Inject 0.3 mg into the muscle as needed for anaphylaxis., Disp: 1 each, Rfl: 0   escitalopram (LEXAPRO) 10 MG tablet, Take 1 tablet (10 mg total) by mouth at bedtime. (Patient taking differently: Take 10 mg by mouth daily.), Disp: 30 tablet, Rfl: 3   fluticasone (FLONASE) 50 MCG/ACT nasal spray, Place 2 sprays into both nostrils daily. (Patient taking differently: Place 2 sprays into both nostrils daily as needed for allergies.), Disp: 15.8 mL, Rfl: 1   Ginger, Zingiber officinalis, (GINGER PO), Take 1 capsule by mouth daily., Disp: , Rfl:    levothyroxine (SYNTHROID) 200 MCG  tablet, TAKE 1 TABLET(200 MCG) BY MOUTH AT BEDTIME, Disp: 90 tablet, Rfl: 1   linaclotide (LINZESS) 290 MCG CAPS capsule, Take 1 capsule (290 mcg total) by mouth daily before breakfast. (Patient not taking: Reported on 06/04/2023), Disp: 90 capsule, Rfl: 3   medroxyPROGESTERone (DEPO-PROVERA) 150 MG/ML injection, Inject 150 mg into the muscle every 3 (three) months., Disp: , Rfl:    methocarbamol (ROBAXIN) 500 MG tablet, Take 1 tablet (500 mg total) by mouth 2 (two) times daily. (Patient not taking: Reported on 06/04/2023), Disp: 20 tablet, Rfl: 0   Olmesartan-amLODIPine-HCTZ 40-10-25 MG TABS, Take 1 tablet by mouth daily., Disp: , Rfl:    oxyCODONE (ROXICODONE) 5 MG immediate release tablet, Take 1 tablet (5 mg total) by mouth every 4 (four) hours as needed for up to 3 days for severe pain (pain score 7-10) or breakthrough pain., Disp: 20 tablet, Rfl: 0   pantoprazole (PROTONIX) 40 MG tablet, Take 1 tablet (40 mg total) by mouth 2 (two) times daily before a meal. (Patient taking differently: Take 40 mg by mouth  daily.), Disp: 180 tablet, Rfl: 3   pregabalin (LYRICA) 150 MG capsule, Take 150 mg by mouth 3 (three) times daily., Disp: , Rfl:    rosuvastatin (CRESTOR) 20 MG tablet, Take 1 tablet (20 mg total) by mouth daily., Disp: 30 tablet, Rfl: 5   traMADol (ULTRAM) 50 MG tablet, Take 100 mg by mouth daily., Disp: , Rfl:    Turmeric (QC TUMERIC COMPLEX PO), Take 1 capsule by mouth daily., Disp: , Rfl:    Vitamin D, Ergocalciferol, (DRISDOL) 1.25 MG (50000 UNIT) CAPS capsule, Take 50,000 Units by mouth once a week., Disp: , Rfl:   Observations/Objective: Patient is well-developed, well-nourished in no acute distress.  Resting comfortably  at home.  Head is normocephalic, atraumatic.  No labored breathing.  Speech is clear and coherent with logical content.  Patient is alert and oriented at baseline.     Assessment and Plan:  1. Dental infection  - penicillin v potassium (VEETID) 500 MG  tablet; Take 1 tablet (500 mg total) by mouth 3 (three) times daily for 7 days.  Dispense: 21 tablet; Refill: 0  -follow up with dentist in 10 days -complete medication in full -if you develop a fever or trouble opening mouth, jaw or neck swelling please go to ED    Reviewed side effects, risks and benefits of medication.    Patient acknowledged agreement and understanding of the plan.   Past Medical, Surgical, Social History, Allergies, and Medications have been Reviewed.      Follow Up Instructions: I discussed the assessment and treatment plan with the patient. The patient was provided an opportunity to ask questions and all were answered. The patient agreed with the plan and demonstrated an understanding of the instructions.  A copy of instructions were sent to the patient via MyChart unless otherwise noted below.    The patient was advised to call back or seek an in-person evaluation if the symptoms worsen or if the condition fails to improve as anticipated.    Freddy Finner, NP

## 2023-07-06 NOTE — Telephone Encounter (Signed)
  Chief Complaint: tooth pain Symptoms: pain to left upper back tooth Frequency: intermittent for a week Pertinent Negatives: Patient denies fever Disposition: [] ED /[] Urgent Care (no appt availability in office) / [] Appointment(In office/virtual)/ [x]  [facility]/ [] Home Care/ [] Refused Recommended Disposition /[] [facility]/ []  Follow-up with PCP Additional Notes: patient calling for intense tooth pain-believes that she is in need of an antibiotic. Pain is a 9 out of 10 currently. Patient recently had surgery and having difficulty getting to face to face appointments. Has been taking Tylenol and doing salt water rinses for her tooth pain. Virtual appointment made for 07/06/2023 at 2:45 pm-pt verified that she has a Mychart account and has done virtual appointments before. Care Advise given. Patient verbalizes understanding with the plan and all questions answered.    Copied from CRM [id number]. Topic: Appointments - Scheduling Inquiry for Clinic >> Jul 06, 2023 10:33 AM Mosetta Putt H wrote: Reason for CRM: wants to know if she can get antibiotic for tooth Reason for Disposition  [1] SEVERE pain (e.g., excruciating, unable to eat, unable to do any normal activities) AND [2] not improved 2 hours after pain medicine  Answer Assessment - Initial Assessment Questions 1. LOCATION: "Which tooth is hurting?"  (e.g., right-side/left-side, upper/lower, front/back)     Left upper back tooth 2. ONSET: "When did the toothache start?"  (e.g., hours, days)      Started about a week ago off and on 3. SEVERITY: "How bad is the toothache?"  (Scale 1-10; mild, moderate or severe)   - MILD (1-3): doesn't interfere with chewing    - MODERATE (4-7): interferes with chewing, interferes with normal activities, awakens from sleep     - SEVERE (8-10): unable to eat, unable to do any normal activities, excruciating pain        9 out of ten 4. SWELLING: "Is there any visible swelling of your face?"      No 5. OTHER SYMPTOMS: "Do you have any other symptoms?" (e.g., fever)     no  Protocols used: Toothache-A-AH

## 2023-07-06 NOTE — Patient Instructions (Addendum)
Ihor Dow, thank you for joining Freddy Finner, NP for today's virtual visit.  While this provider is not your primary care provider (PCP), if your PCP is located in our provider database this encounter information will be shared with them immediately following your visit.   A [facility] MyChart account gives you access to today's visit and all your visits, tests, and labs performed at [facility] " click here if you don't have a  MyChart account or go to [email or web address]  Consent: (Patient) Martha [place] provided verbal consent for this virtual visit at the beginning of the encounter.  Current Medications:  Current Outpatient Medications:    aspirin EC 325 MG tablet, Take 1 tablet (325 mg total) by mouth daily., Disp: 30 tablet, Rfl: 0   carboxymethylcellulose (REFRESH PLUS) 0.5 % SOLN, Place 1 drop into both eyes 3 (three) times daily as needed (dry eyes)., Disp: , Rfl:    Cholecalciferol (VITAMIN D3) 25 MCG (1000 UT) CAPS, Take 1 capsule (1,000 Units total) by mouth daily. (Patient not taking: Reported on 06/04/2023), Disp: 90 capsule, Rfl: 1   Cyanocobalamin (B-12 PO), Take 1 tablet by mouth daily., Disp: , Rfl:    diphenhydrAMINE (BENADRYL) 25 MG tablet, Take 25 mg by mouth daily., Disp: , Rfl:    EPINEPHrine 0.3 mg/0.3 mL IJ SOAJ injection, Inject 0.3 mg into the muscle as needed for anaphylaxis., Disp: 1 each, Rfl: 0   escitalopram (LEXAPRO) 10 MG tablet, Take 1 tablet (10 mg total) by mouth at bedtime. (Patient taking differently: Take 10 mg by mouth daily.), Disp: 30 tablet, Rfl: 3   fluticasone (FLONASE) 50 MCG/ACT nasal spray, Place 2 sprays into both nostrils daily. (Patient taking differently: Place 2 sprays into both nostrils daily as needed for allergies.), Disp: 15.8 mL, Rfl: 1   Ginger, Zingiber officinalis, (GINGER PO), Take 1 capsule by mouth daily., Disp: , Rfl:    levothyroxine (SYNTHROID) 200 MCG tablet, TAKE 1 TABLET(200 MCG) BY MOUTH  AT BEDTIME, Disp: 90 tablet, Rfl: 1   linaclotide (LINZESS) 290 MCG CAPS capsule, Take 1 capsule (290 mcg total) by mouth daily before breakfast. (Patient not taking: Reported on 06/04/2023), Disp: 90 capsule, Rfl: 3   medroxyPROGESTERone (DEPO-PROVERA) 150 MG/ML injection, Inject 150 mg into the muscle every 3 (three) months., Disp: , Rfl:    methocarbamol (ROBAXIN) 500 MG tablet, Take 1 tablet (500 mg total) by mouth 2 (two) times daily. (Patient not taking: Reported on 06/04/2023), Disp: 20 tablet, Rfl: 0   Olmesartan-amLODIPine-HCTZ 40-10-25 MG TABS, Take 1 tablet by mouth daily., Disp: , Rfl:    oxyCODONE (ROXICODONE) 5 MG immediate release tablet, Take 1 tablet (5 mg total) by mouth every 4 (four) hours as needed for up to 3 days for severe pain (pain score 7-10) or breakthrough pain., Disp: 20 tablet, Rfl: 0   pantoprazole (PROTONIX) 40 MG tablet, Take 1 tablet (40 mg total) by mouth 2 (two) times daily before a meal. (Patient taking differently: Take 40 mg by mouth daily.), Disp: 180 tablet, Rfl: 3   pregabalin (LYRICA) 150 MG capsule, Take 150 mg by mouth 3 (three) times daily., Disp: , Rfl:    rosuvastatin (CRESTOR) 20 MG tablet, Take 1 tablet (20 mg total) by mouth daily., Disp: 30 tablet, Rfl: 5   traMADol (ULTRAM) 50 MG tablet, Take 100 mg by mouth daily., Disp: , Rfl:    Turmeric (QC TUMERIC COMPLEX PO), Take 1 capsule by mouth daily., Disp: , Rfl:  Vitamin D, Ergocalciferol, (DRISDOL) 1.25 MG (50000 UNIT) CAPS capsule, Take 50,000 Units by mouth once a week., Disp: , Rfl:    Medications ordered in this encounter:  No orders of the defined types were placed in this encounter.    *If you need refills on other medications prior to your next appointment, please contact your pharmacy*  Follow-Up: Call back or seek an in-person evaluation if the symptoms worsen or if the condition fails to improve as anticipated.  [facility] [phone]  Other Instructions  -follow  up with dentist in 10 days -complete medication in full -if you develop a fever or trouble opening mouth, jaw or neck swelling please go to ED   Dental Pain Dental pain is often a sign that something is wrong with your teeth or gums. You can also have pain after a dental treatment. If you have dental pain, it is important to contact your dentist, especially if the cause of the pain is not known. Dental pain may hurt a lot or a little and can be caused by many things, including: Tooth decay (cavities or caries). Infection. The inner part of the tooth being filled with pus (an abscess). Injury. A crack in the tooth. Gums that move back and expose the root of a tooth. Gum disease. Abnormal grinding or clenching of teeth. Not taking good care of your teeth. Sometimes the cause of pain is not known. You may have pain all the time, or it may happen only when you are: Chewing. Exposed to hot or cold temperatures. Eating or drinking foods or drinks that have a lot of sugar in them, such as soda or candy. Follow these instructions at home: Medicines Take over-the-counter and prescription medicines only as told by your dentist. If you were prescribed an antibiotic medicine, take it as told by your dentist. Do not stop taking it even if you start to feel better. Eating and drinking Do not eat foods or drinks that cause you pain. These include: Very hot or very cold foods or drinks. Sweet or sugary foods or drinks. Managing pain and swelling  If told, put ice on the painful area of your face. To do this: Put ice in a plastic bag. Place a towel between your skin and the bag. Leave the ice on for 20 minutes, 2-3 times a day. Take off the ice if your skin turns bright red. This is very important. If you cannot feel pain, heat, or cold, you have a greater risk of damage to the area. Brushing your teeth Brush your teeth twice a day using a fluoride toothpaste. Use a toothpaste made for sensitive  teeth as told by your dentist. Use a soft toothbrush. General instructions Floss your teeth at least once a day. Do not put heat on the outside of your face. Rinse your mouth often with salt water. To make salt water, dissolve -1 tsp (3-6 g) of salt in 1 cup (237 mL) of warm water. Watch your dental pain. Let your dentist know if there are any changes. Keep all follow-up visits. Contact a dentist if: You have dental pain and you do not know why. Medicine does not help your pain. Your symptoms get worse. You have new symptoms. Get help right away if: You cannot open your mouth. You are having trouble breathing or swallowing. You have a fever. Your face, neck, or jaw is swollen. These symptoms may be an emergency. Get help right away. Call your local emergency  services (911 in the [place] Do not wait to see if the symptoms will go away. Do not drive yourself to the hospital. Summary Dental pain may be caused by many things, including tooth decay, injury, or infection. In some cases, the cause is not known. Dental pain may hurt a lot or very little. You may have pain all the time, or you may have it only when you eat or drink. Take over-the-counter and prescription medicines only as told by your dentist. Watch your dental pain for any changes. Let your dentist know if symptoms get worse. This information is not intended to replace advice given to you by your health care provider. Make sure you discuss any questions you have with your health care provider. Document Revised: 04/18/2020 Document Reviewed: 04/18/2020 Elsevier Patient Education  2024 Elsevier Inc.   If you have been instructed to have an in-person evaluation today at a local Urgent Care facility, please use the link below. It will take you to a list of all of our available [facility], including address, phone number and hours of operation. Please do not delay care.  [facility]  If you or a  family member do not have a primary care provider, use the link below to schedule a visit and establish care. When you choose a [facility] primary care physician or advanced practice provider, you gain a long-term partner in health. Find a Primary Care Provider  Learn more about [facility]'s in-office and virtual care options: [place] - Get Care Now

## 2023-07-07 ENCOUNTER — Encounter (HOSPITAL_COMMUNITY): Payer: Self-pay

## 2023-07-07 ENCOUNTER — Ambulatory Visit (HOSPITAL_COMMUNITY): Payer: Medicare Other | Admitting: Physical Therapy

## 2023-07-10 ENCOUNTER — Telehealth: Payer: Self-pay | Admitting: Orthopaedic Surgery

## 2023-07-10 ENCOUNTER — Ambulatory Visit (HOSPITAL_BASED_OUTPATIENT_CLINIC_OR_DEPARTMENT_OTHER): Payer: Medicare Other

## 2023-07-10 ENCOUNTER — Encounter (HOSPITAL_COMMUNITY): Payer: Medicare Other

## 2023-07-10 ENCOUNTER — Ambulatory Visit (HOSPITAL_BASED_OUTPATIENT_CLINIC_OR_DEPARTMENT_OTHER): Payer: Medicare Other | Admitting: Orthopaedic Surgery

## 2023-07-10 ENCOUNTER — Other Ambulatory Visit (HOSPITAL_BASED_OUTPATIENT_CLINIC_OR_DEPARTMENT_OTHER): Payer: Self-pay

## 2023-07-10 DIAGNOSIS — M2390 Unspecified internal derangement of unspecified knee: Secondary | ICD-10-CM | POA: Diagnosis not present

## 2023-07-10 DIAGNOSIS — M2391 Unspecified internal derangement of right knee: Secondary | ICD-10-CM

## 2023-07-10 DIAGNOSIS — Z9889 Other specified postprocedural states: Secondary | ICD-10-CM | POA: Diagnosis not present

## 2023-07-10 MED ORDER — TRAMADOL HCL 50 MG PO TABS
100.0000 mg | ORAL_TABLET | Freq: Every day | ORAL | 0 refills | Status: AC
  Filled 2023-07-10: qty 30, 15d supply, fill #0

## 2023-07-10 NOTE — Progress Notes (Signed)
Chief Complaint: Status post right tibial tubercle osteotomy with MPFL reconstruction     History of Present Illness:   07/10/2023: Status post right knee MPFL reconstruction and tibial tubercle osteotomy  Martha [place] is a 42 y.o. female presents today 3 weeks status post right knee MPFL reconstruction and tibial tubercle osteotomy.  Overall she is doing very well.  She has been able to apply weight fully on the right leg.  She has been working with physical therapy.  Denies any significant pain or swelling in the calf or knee.    Surgical History:   None  PMH/PSH/Family History/Social History/Meds/Allergies:    Past Medical History:  Diagnosis Date   Anxiety    Anxiety disorder    Arthritis    "knees, joints, hands, toes" (09/28/2013)   Depression    Depression, major, single episode, in partial remission (HCC) 10/18/2008   Qualifier: Diagnosis of  By: Lodema Hong MD, Margaret  PHQ 9 score of 7 in 10/2017, not suicidal or homicidal, interested in telepsych however, score of 16 in  02/2018, not suicidal or homicidal, start medication and refer to telepsych Score of 7 in 07/2018    Fibromyalgia    Gastritis nov. 2011   EGD by Dr. Darrick Penna, negative h pylori   GERD (gastroesophageal reflux disease)    Gout    Hypertension x 6 years    Hypothyroidism    Migraines    "15/month average" (09/28/2013)   Obesity (BMI 30.0-34.9)    Spinal stenosis    Thyroid goiter    Past Surgical History:  Procedure Laterality Date   BIOPSY  10/20/2017   Procedure: BIOPSY;  Surgeon: West Bali, MD;  [facility];  Service: Endoscopy;;  gastric duodenal esophagus   CESAREAN SECTION  2008   CHONDROPLASTY  04/02/2012   Procedure: CHONDROPLASTY;  Surgeon: Vickki Hearing, MD;  Location: [facility];  Service: Orthopedics;  Laterality: Left;  of left patella   COLONOSCOPY  11/2010   Dr. Darrick Penna: normal colon, small internal hemorrhoids   COLONOSCOPY WITH  PROPOFOL N/A 10/22/2017   Surgeon: West Bali, MD; significant looping of the colon, internal hemorrhoids, otherwise normal exam.  Recommended 10-year surveillance.   COLONOSCOPY WITH PROPOFOL N/A 10/20/2017   Procedure: COLONOSCOPY WITH PROPOFOL;  Surgeon: West Bali, MD;  [facility]: [facility];  Service: Endoscopy;  Laterality: N/A;  11:00am   ESOPHAGOGASTRODUODENOSCOPY (EGD) WITH PROPOFOL N/A 10/20/2017   Surgeon: West Bali, MD; no endoscopic abnormality to explain dysphagia s/p empiric dilation, gastritis.  Esophageal biopsies consistent with reflux, gastric biopsies with gastric antral and oxyntic mucosa negative for H. pylori, duodenal biopsies benign.   INCISION AND DRAINAGE ABSCESS  1997; 2005   "under right buttocks; under left arm"   KNEE ARTHROSCOPY Left 2021   KNEE ARTHROSCOPY WITH MEDIAL PATELLAR FEMORAL LIGAMENT RECONSTRUCTION Right 06/16/2023   Procedure: RIGHT KNEE ARTHROSCOPY WITH MEDIAL PATELLAR FEMORAL LIGAMENT RECONSTRUCTION;  Surgeon: Huel Cote, MD;  Location: [facility] OR;  Service: Orthopedics;  Laterality: Right;   SAVORY DILATION N/A 10/20/2017   Procedure: SAVORY DILATION;  Surgeon: West Bali, MD;  [facility];  Service: Endoscopy;  Laterality: N/A;   THYROIDECTOMY N/A 09/28/2013   Procedure: TOTAL THYROIDECTOMY;  Surgeon: Darletta Moll, MD;  Location: [facility] OR;  Service: ENT;  Laterality: N/A;   TIBIA OSTEOTOMY Right 06/16/2023   Procedure: RIGHT KNEE TIBIAL TUBERLE OSTEOTOMY;  Surgeon: Huel Cote, MD;  Location: [facility] OR;  Service: Orthopedics;  Laterality: Right;   UPPER GI ENDOSCOPY  2011   EGD: gastritis, no h.pylori   Social History   Socioeconomic History   Marital status: Legally Separated    Spouse name: Not on file   Number of children: 1   Years of education: 12   Highest education level: 12th grade  Occupational History   Occupation: Banker of Human resources officer: UNEMPLOYED   Tobacco Use   Smoking status: Former    Current packs/day: 0.00    Average packs/day: 1 pack/day for 5.0 years (5.0 ttl pk-yrs)    Types: Cigarettes    Start date: 09/11/2005    Quit date: 09/11/2010    Years since quitting: 12.8   Smokeless tobacco: Never   Tobacco comments:    QUIT SMOKING X 3 YEARS AGO,was vaping but has since quit completely  Vaping Use   Vaping status: Former   Quit date: 04/28/2023  Substance and Sexual Activity   Alcohol use: No   Drug use: No   Sexual activity: Not Currently    Partners: Male    Birth control/protection: Other-see comments, Injection    Comment: Depo  Other Topics Concern   Not on file  Social History Narrative   Pt is currently on medicaid. From [place] and came to [facility] in mid 2010   Is separated from Husband, lives with Mother right now    Social Drivers of Health   Financial Resource Strain: Medium Risk (06/22/2017)   Overall Financial Resource Strain (CARDIA)    Difficulty of Paying Living Expenses: Somewhat hard  Food Insecurity: Food Insecurity Present (08/30/2020)   Hunger Vital Sign    Worried About Running Out of Food in the Last Year: Sometimes true    Ran Out of Food in the Last Year: Sometimes true  Transportation Needs: No Transportation Needs (08/30/2020)   PRAPARE - Administrator, Civil Service (Medical): No    Lack of Transportation (Non-Medical): No  Physical Activity: Inactive (05/25/2023)   Exercise Vital Sign    Days of Exercise per Week: 0 days    Minutes of Exercise per Session: 0 min  Stress: Stress Concern Present (05/25/2023)   [employer] of Occupational Health - Occupational Stress Questionnaire    Feeling of Stress : To some extent  Social Connections: Unknown (08/20/2022)   [facility]   Social Network    Social Network: Not on file   Family History  Problem Relation Age of Onset   Arthritis Mother    Migraines Mother    Hypertension Mother    Cancer  Maternal Grandmother        lung   Asthma Other        family history    Colon cancer Neg Hx    Allergies  Allergen Reactions   Aspirin     Other reaction(s): Bleeding (intolerance) Rectal bleeding   Codeine Itching   Diclofenac     gastritis   Ibuprofen Other (See Comments)    Stomach pain   Metronidazole     Patient cannot remember   Nsaids    Soybean-Containing Drug Products Other (See Comments)    Tongue swells a little and feels "rug burned".   Current Outpatient Medications  Medication Sig Dispense Refill   aspirin  EC 325 MG tablet Take 1 tablet (325 mg total) by mouth daily. 30 tablet 0   carboxymethylcellulose (REFRESH PLUS) 0.5 % SOLN Place 1 drop into both eyes 3 (three) times daily as needed (dry eyes).     Cholecalciferol (VITAMIN D3) 25 MCG (1000 UT) CAPS Take 1 capsule (1,000 Units total) by mouth daily. (Patient not taking: Reported on 06/04/2023) 90 capsule 1   Cyanocobalamin (B-12 PO) Take 1 tablet by mouth daily.     diphenhydrAMINE (BENADRYL) 25 MG tablet Take 25 mg by mouth daily.     EPINEPHrine 0.3 mg/0.3 mL IJ SOAJ injection Inject 0.3 mg into the muscle as needed for anaphylaxis. 1 each 0   escitalopram (LEXAPRO) 10 MG tablet Take 1 tablet (10 mg total) by mouth at bedtime. (Patient taking differently: Take 10 mg by mouth daily.) 30 tablet 3   fluticasone (FLONASE) 50 MCG/ACT nasal spray Place 2 sprays into both nostrils daily. (Patient taking differently: Place 2 sprays into both nostrils daily as needed for allergies.) 15.8 mL 1   Ginger, Zingiber officinalis, (GINGER PO) Take 1 capsule by mouth daily.     levothyroxine (SYNTHROID) 200 MCG tablet TAKE 1 TABLET(200 MCG) BY MOUTH AT BEDTIME 90 tablet 1   linaclotide (LINZESS) 290 MCG CAPS capsule Take 1 capsule (290 mcg total) by mouth daily before breakfast. (Patient not taking: Reported on 06/04/2023) 90 capsule 3   medroxyPROGESTERone (DEPO-PROVERA) 150 MG/ML injection Inject 150 mg into the muscle every 3  (three) months.     methocarbamol (ROBAXIN) 500 MG tablet Take 1 tablet (500 mg total) by mouth 2 (two) times daily. (Patient not taking: Reported on 06/04/2023) 20 tablet 0   Olmesartan-amLODIPine-HCTZ 40-10-25 MG TABS Take 1 tablet by mouth daily.     pantoprazole (PROTONIX) 40 MG tablet Take 1 tablet (40 mg total) by mouth 2 (two) times daily before a meal. (Patient taking differently: Take 40 mg by mouth daily.) 180 tablet 3   penicillin v potassium (VEETID) 500 MG tablet Take 1 tablet (500 mg total) by mouth 3 (three) times daily for 7 days. 21 tablet 0   pregabalin (LYRICA) 150 MG capsule Take 150 mg by mouth 3 (three) times daily.     rosuvastatin (CRESTOR) 20 MG tablet Take 1 tablet (20 mg total) by mouth daily. 30 tablet 5   traMADol (ULTRAM) 50 MG tablet Take 2 tablets (100 mg total) by mouth daily. 30 tablet 0   Turmeric (QC TUMERIC COMPLEX PO) Take 1 capsule by mouth daily.     Vitamin D, Ergocalciferol, (DRISDOL) 1.25 MG (50000 UNIT) CAPS capsule Take 50,000 Units by mouth once a week.     No current facility-administered medications for this visit.   No results found.  Review of Systems:   A ROS was performed including pertinent positives and negatives as documented in the HPI.  Physical Exam :   Constitutional: NAD and appears stated age Neurological: Alert and oriented Psych: Appropriate affect and cooperative There were no vitals taken for this visit.   Comprehensive Musculoskeletal Exam:    Right knee incisions are well-appearing without erythema or drainage.  No joint line tenderness.  Negative laxity with lateral patellar stress.  Imaging:   Xray (4 views right knee, 4 views left knee): Status post right tibial tubercle osteotomy without evidence of complication    I personally reviewed and interpreted the radiographs.   Assessment:   42 y.o. female 2-week status post right knee tibial tubercle osteotomy and MPFL reconstruction  overall doing very well.  At  this time she will advance her weightbearing as well as her flexion according to the tibial tubercle osteotomy protocol.  I will plan to see her back in 4 weeks for reassessment Plan :    -Return to clinic in 4 weeks for reassessment     I personally saw and evaluated the patient, and participated in the management and treatment plan.  Huel Cote, MD Attending Physician, Orthopedic Surgery  This document was dictated using Dragon voice recognition software. A reasonable attempt at proof reading has been made to minimize errors.

## 2023-07-10 NOTE — Telephone Encounter (Signed)
Double message

## 2023-07-10 NOTE — Telephone Encounter (Signed)
Patient called and said that her medications needs to go to [facility] on [address] in [place]. CB#[phone]

## 2023-07-10 NOTE — Telephone Encounter (Signed)
Patient called needs a refill of oxycodone. CB#[phone]

## 2023-07-11 ENCOUNTER — Other Ambulatory Visit (HOSPITAL_BASED_OUTPATIENT_CLINIC_OR_DEPARTMENT_OTHER): Payer: Self-pay | Admitting: Orthopaedic Surgery

## 2023-07-11 MED ORDER — OXYCODONE HCL 5 MG PO TABS: 5.0000 mg | ORAL_TABLET | ORAL | 0 refills | Status: DC | PRN

## 2023-07-13 DIAGNOSIS — M25561 Pain in right knee: Secondary | ICD-10-CM | POA: Diagnosis not present

## 2023-07-13 DIAGNOSIS — Z79899 Other long term (current) drug therapy: Secondary | ICD-10-CM | POA: Diagnosis not present

## 2023-07-13 DIAGNOSIS — E559 Vitamin D deficiency, unspecified: Secondary | ICD-10-CM | POA: Diagnosis not present

## 2023-07-13 DIAGNOSIS — M542 Cervicalgia: Secondary | ICD-10-CM | POA: Diagnosis not present

## 2023-07-13 DIAGNOSIS — M47812 Spondylosis without myelopathy or radiculopathy, cervical region: Secondary | ICD-10-CM | POA: Diagnosis not present

## 2023-07-13 DIAGNOSIS — G8929 Other chronic pain: Secondary | ICD-10-CM | POA: Diagnosis not present

## 2023-07-13 DIAGNOSIS — M1712 Unilateral primary osteoarthritis, left knee: Secondary | ICD-10-CM | POA: Diagnosis not present

## 2023-07-13 DIAGNOSIS — R030 Elevated blood-pressure reading, without diagnosis of hypertension: Secondary | ICD-10-CM | POA: Diagnosis not present

## 2023-07-13 DIAGNOSIS — M25562 Pain in left knee: Secondary | ICD-10-CM | POA: Diagnosis not present

## 2023-07-13 DIAGNOSIS — I10 Essential (primary) hypertension: Secondary | ICD-10-CM | POA: Diagnosis not present

## 2023-07-13 DIAGNOSIS — R7303 Prediabetes: Secondary | ICD-10-CM | POA: Diagnosis not present

## 2023-07-14 ENCOUNTER — Ambulatory Visit (HOSPITAL_COMMUNITY): Payer: Medicare Other

## 2023-07-14 ENCOUNTER — Telehealth (HOSPITAL_COMMUNITY): Payer: Self-pay

## 2023-07-14 ENCOUNTER — Ambulatory Visit: Payer: Self-pay | Admitting: Licensed Clinical Social Worker

## 2023-07-14 ENCOUNTER — Encounter (HOSPITAL_COMMUNITY): Payer: Self-pay

## 2023-07-14 NOTE — Telephone Encounter (Signed)
Patient states she called to cancel her appt this morning  Is aware of upcoming appt on Thursday 12/19.  8:40 AM, 07/14/23 Rufus Cypert Small Martha [place] MPT  physical therapy [place] [phone] Ph:[phone]
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
## 2023-07-16 ENCOUNTER — Ambulatory Visit (HOSPITAL_COMMUNITY): Payer: Medicare Other

## 2023-07-16 DIAGNOSIS — R262 Difficulty in walking, not elsewhere classified: Secondary | ICD-10-CM

## 2023-07-16 DIAGNOSIS — M25661 Stiffness of right knee, not elsewhere classified: Secondary | ICD-10-CM

## 2023-07-16 DIAGNOSIS — M2390 Unspecified internal derangement of unspecified knee: Secondary | ICD-10-CM | POA: Diagnosis not present

## 2023-07-16 DIAGNOSIS — M25361 Other instability, right knee: Secondary | ICD-10-CM | POA: Diagnosis not present

## 2023-07-16 NOTE — Therapy (Signed)
OUTPATIENT PHYSICAL THERAPY LOWER EXTREMITY TREATMENT   Patient Name: Martha Montgomery MRN: [id number] DOB:02/08/1981, 42 y.o., female Today's Date: 07/16/2023  END OF SESSION:  PT End of Session - 07/16/23 1431     Visit Number 2    Number of Visits 16    Date for PT Re-Evaluation 08/18/23    Authorization Type [employer] Medicare    PT Start Time 1431    PT Stop Time 1510    PT Time Calculation (min) 39 min    Activity Tolerance Patient tolerated treatment well    Behavior During Therapy WFL for tasks assessed/performed             Past Medical History:  Diagnosis Date   Anxiety    Anxiety disorder    Arthritis    "knees, joints, hands, toes" (09/28/2013)   Depression    Depression, major, single episode, in partial remission (HCC) 10/18/2008   Qualifier: Diagnosis of  By: Lodema Hong MD, Margaret  PHQ 9 score of 7 in 10/2017, not suicidal or homicidal, interested in telepsych however, score of 16 in  02/2018, not suicidal or homicidal, start medication and refer to telepsych Score of 7 in 07/2018    Fibromyalgia    Gastritis nov. 2011   EGD by Dr. Darrick Penna, negative h pylori   GERD (gastroesophageal reflux disease)    Gout    Hypertension x 6 years    Hypothyroidism    Migraines    "15/month average" (09/28/2013)   Obesity (BMI 30.0-34.9)    Spinal stenosis    Thyroid goiter    Past Surgical History:  Procedure Laterality Date   BIOPSY  10/20/2017   Procedure: BIOPSY;  Surgeon: West Bali, MD;  Location: [facility];  Service: Endoscopy;;  gastric duodenal esophagus   CESAREAN SECTION  2008   CHONDROPLASTY  04/02/2012   Procedure: CHONDROPLASTY;  Surgeon: Vickki Hearing, MD;  Location: [facility] ORS;  Service: Orthopedics;  Laterality: Left;  of left patella   COLONOSCOPY  11/2010   Dr. Darrick Penna: normal colon, small internal hemorrhoids   COLONOSCOPY WITH PROPOFOL N/A 10/22/2017   Surgeon: West Bali, MD; significant looping of the colon, internal hemorrhoids,  otherwise normal exam.  Recommended 10-year surveillance.   COLONOSCOPY WITH PROPOFOL N/A 10/20/2017   Procedure: COLONOSCOPY WITH PROPOFOL;  Surgeon: West Bali, MD;  Location: [facility];  Service: Endoscopy;  Laterality: N/A;  11:00am   ESOPHAGOGASTRODUODENOSCOPY (EGD) WITH PROPOFOL N/A 10/20/2017   Surgeon: West Bali, MD; no endoscopic abnormality to explain dysphagia s/p empiric dilation, gastritis.  Esophageal biopsies consistent with reflux, gastric biopsies with gastric antral and oxyntic mucosa negative for H. pylori, duodenal biopsies benign.   INCISION AND DRAINAGE ABSCESS  1997; 2005   "under right buttocks; under left arm"   KNEE ARTHROSCOPY Left 2021   KNEE ARTHROSCOPY WITH MEDIAL PATELLAR FEMORAL LIGAMENT RECONSTRUCTION Right 06/16/2023   Procedure: RIGHT KNEE ARTHROSCOPY WITH MEDIAL PATELLAR FEMORAL LIGAMENT RECONSTRUCTION;  Surgeon: Huel Cote, MD;  Location: [facility] OR;  Service: Orthopedics;  Laterality: Right;   SAVORY DILATION N/A 10/20/2017   Procedure: SAVORY DILATION;  Surgeon: West Bali, MD;  Location: [facility];  Service: Endoscopy;  Laterality: N/A;   THYROIDECTOMY N/A 09/28/2013   Procedure: TOTAL THYROIDECTOMY;  Surgeon: Darletta Moll, MD;  [facility];  Service: ENT;  Laterality: N/A;   TIBIA OSTEOTOMY Right 06/16/2023   Procedure: RIGHT KNEE TIBIAL TUBERLE OSTEOTOMY;  Surgeon: Huel Cote, MD;  Location:  [facility] OR;  Service: Orthopedics;  Laterality: Right;   UPPER GI ENDOSCOPY  2011   EGD: gastritis, no h.pylori   Patient Active Problem List   Diagnosis Date Noted   Patellar instability of both knees 06/16/2023   GAD (generalized anxiety disorder) 04/20/2023   Mild depression 04/20/2023   Depression, major, single episode, mild (HCC) 04/20/2023   Decreased vision 04/20/2023   Bladder distension 04/20/2023   Encounter for surveillance of injectable contraceptive 11/07/2022   Encounter for Medicare annual examination with abnormal  findings 08/07/2022   Hyperlipidemia 06/08/2022   Worm infestation 05/12/2022   Generalized joint pain 03/26/2022   Twitching 09/03/2021   Toxic effect of kerosene 09/03/2021   Hypertension    Finger pain, right 07/31/2020   Bilateral groin pain 07/31/2020   Dental caries 04/24/2019   Morbid obesity due to excess calories (HCC) 01/08/2019   Multiple benign lumps of breast 08/17/2018   Hemorrhoids 05/19/2018   Encounter for contraceptive management 03/20/2018   Low vitamin D level 11/30/2017   Bloating 03/05/2017   Bilateral knee pain 01/05/2017   Amenorrhea 01/05/2017   Bilateral hip pain 07/22/2016   Post-surgical hypothyroidism 06/02/2014   Knee pain 06/02/2012   Patellar malalignment syndrome 03/09/2012   IBS (irritable bowel syndrome) 08/29/2011   GERD (gastroesophageal reflux disease) 08/29/2011   Back pain 09/24/2010   Migraine headache 04/02/2010   Constipation 09/12/2009   Allergic rhinitis 06/16/2009    PCP: Syliva Overman  REFERRING PROVIDER: Huel Cote, MD  REFERRING DIAG: M23.90 (ICD-10-CM) - Patellar malalignment syndrome, unspecified laterality  THERAPY DIAG:  Patellar instability of right knee  Difficulty in walking, not elsewhere classified  Knee stiffness, right  Rationale for Evaluation and Treatment: Rehabilitation  ONSET DATE: 06/16/2023  SUBJECTIVE:   SUBJECTIVE STATEMENT: Arrives today with 1 crutch; she went back to surgeon and x-ray's "looked good"; healing well.  Sutures out.  Has not driven yet but has returned to her pre surgical pain medication regimen.  She has pain at night.     EVAL:S/p Medial Patella Femoral ligament reconstruction per Dr. Steward Drone 06/16/23; right knee instability; "tearing sound" with steps especially She jarred her knee a couple times; "overstretched the knee" and is hearing some "cracking" but had her immobilizer on both times.   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  Patient  arrives in immobilizer with crutches;  states she is "supposed to get a new brace today" but PT informs her this is likely to be done at her MD office; patient instructed to call her surgeon's office regarding follow up appointment for brace an to get sutures out.    PERTINENT HISTORY: Have had 2 surgeries on left knee already for instability Pinched nerve in back running down leg PAIN:  Are you having pain? Yes: NPRS scale: 5/10 Pain location: right knee, anterior knee Pain description: burning, constant ache; sometimes sharp pain; spasm  Aggravating factors: jarring  Relieving factors: pain meds, ice  PRECAUTIONS: Knee Right knee  RED FLAGS: None noted  WEIGHT BEARING RESTRICTIONS: Yes heel touch only   FALLS:  Has patient fallen in last 6 months? 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  DIAGNOSTIC FINDINGS:   PATIENT SURVEYS:  FOTO 29  COGNITION: Overall cognitive status: Within functional limits for tasks assessed     SENSATION: WFL  EDEMA:  Normal for this time s/p   POSTURE: rounded shoulders, forward head, and weight shift left  PALPATION: General tenderness right knee   LOWER EXTREMITY ROM:  Active ROM Right eval Left eval Right    Hip flexion     Hip extension     Hip abduction     Hip adduction     Hip internal rotation     Hip external rotation     Knee flexion   60 AAROM  Knee extension -12 hyperextends -5  Ankle dorsiflexion     Ankle  plantarflexion     Ankle inversion     Ankle eversion      (Blank rows = not tested)  LOWER EXTREMITY MMT:  MMT Right eval Left eval  Hip flexion    Hip extension    Hip abduction    Hip adduction    Hip internal rotation    Hip external rotation    Knee flexion    Knee extension Poor quad set   Ankle dorsiflexion    Ankle plantarflexion    Ankle inversion    Ankle eversion     (Blank rows = not tested)  FUNCTIONAL TESTS:  30 seconds chair stand test x 1  GAIT: Distance walked: 60 ft in clinic Assistive device utilized: Crutches Level of assistance: SBA Comments: heel touch weightbearing right leg   TODAY'S TREATMENT:                                                                                                                              DATE:   07/16/23  Review of HEP and goals Supine: Quad sets 5" holds x 10 Ankle pumps x 20 Right heel slides with strap x 10 Adjusted right knee brace Standing: heel raises x 10 Weight shifts x 10    06/30/23 physical therapy evaluation and HEP instruction    PATIENT EDUCATION:  Education details: Patient educated on exam findings, POC, scope of PT, HEP, and what to expect visit. Person educated: Patient Education method: Explanation, Demonstration, and Handouts Education comprehension: verbalized understanding, returned demonstration, verbal cues required, and tactile cues required  HOME EXERCISE PROGRAM: Access Code: [email or web address] URL: [email or web address] Date: 06/30/2023 Prepared by: [facility] - [facility]  Exercises - Supine Quad Set  - 2 x daily - 7 x weekly - 1 sets - 10 reps - 5 sec hold - gentle 4 way patellar glides  - 2 x daily - 7 x weekly - 1 sets - 10 reps - Supine Ankle Pumps  - 2 x daily - 7 x weekly - 2 sets - 10 reps - Seated  Ankle Pumps  - 2 x daily - 7 x weekly - 2 sets - 10 reps - Supine Active Straight Leg Raise  - 2 x daily - 7 x weekly - 1 sets - 10 reps Access Code: [email or web address] URL:  [email or web address] Date: 07/16/2023 Prepared by: [facility] - [facility]  Exercises - Supine Quad Set  - 2 x daily - 7 x weekly - 1 sets - 10 reps - 5 sec hold - gentle 4 way patellar glides  - 2 x daily - 7 x weekly - 1 sets - 10 reps - Supine Ankle Pumps  - 2 x daily - 7 x weekly - 2 sets - 10 reps - Seated Ankle Pumps  - 2 x daily - 7 x weekly - 2 sets - 10 reps - Supine Active Straight Leg Raise  - 2 x daily - 7 x weekly - 1 sets - 10 reps - Supine Heel Slide with Strap  - 1 x daily - 7 x weekly - 1 sets - 10 reps - Standing Heel Raise with Support  - 1 x daily - 7 x weekly - 1 sets - 10 reps - Side to Side Weight Shift with Counter Support  - 1 x daily - 7 x weekly - 1 sets - 10 reps ASSESSMENT:  CLINICAL IMPRESSION: Started today's session with a review of HEP and goals.  Patient verbalizes agreement with set rehab goals. Improved quad set noted right quad. Improved mobility noted although still tight with flexion.  Adjusted knee brace for patient as she states it "always slides down".  Updated HEP and introduced increased weight bearing exercise.  Patient will benefit from continued skilled therapy services to address deficits and promote return to optimal function.       Eval:Patient is a 42 y.o. female who was seen today for physical therapy evaluation and treatment for s/p M23.90 (ICD-10-CM) - Patellar malalignment syndrome, unspecified laterality.  Patient demonstrates muscle weakness, reduced ROM, and fascial restrictions which are likely contributing to symptoms of pain and are negatively impacting patient ability to perform ADLs and functional mobility tasks. Patient will benefit from skilled physical therapy services to address these deficits to reduce pain and improve level of function with ADLs and functional mobility tasks.   OBJECTIVE IMPAIRMENTS: Abnormal gait, decreased activity tolerance, decreased endurance, decreased knowledge of use of DME, decreased mobility,  difficulty walking, decreased ROM, decreased strength, increased edema, increased fascial restrictions, impaired perceived functional ability, impaired flexibility, and pain.   ACTIVITY LIMITATIONS: lifting, bending, sitting, standing, squatting, sleeping, stairs, transfers, bed mobility, bathing, toileting, dressing, locomotion level, and caring for others  PARTICIPATION LIMITATIONS: meal prep, cleaning, laundry, driving, shopping, community activity, occupation, and church  PERSONAL FACTORS:  back pain and left knee pain  are also affecting patient's functional outcome.   REHAB POTENTIAL: Good  CLINICAL DECISION MAKING: Stable/uncomplicated  EVALUATION COMPLEXITY: Low   GOALS: Goals reviewed with patient? No  SHORT TERM GOALS: Target date: 07/28/2023 patient will be independent with initial HEP  Baseline: Goal status: IN progress   2.  Patient will improve right knee mobility to 0 to 45 degrees to promote progression toward normalizing gait pattern Baseline:  Goal status: in progress    LONG TERM GOALS: Target date: 08/18/2023  Patient will improve FOTO score by 10 points to demonstrate improved perceived functional mobility  Baseline: 29 Goal status: in progress  2.  Patient will ambulate without AD and minimal gait deviation household and short community distances to safely  grocery shop Baseline:  Goal status: in progress  3.  Patient will increase right knee mobility to 0 to 120 to promote normal navigation of steps; step over step pattern  Baseline: see above Goal status: IN progress  4.  Patient will increase right  leg MMTs to 4+ to 5/5 without pain to promote return to ambulation community distances with minimal deviation. (Goal Date 12 weeks out per protocol)  Baseline: see above Goal status: IN progress  PLAN:  PT FREQUENCY: 2x/week  PT DURATION: 8 weeks  PLANNED INTERVENTIONS: 97164- PT Re-evaluation, 97110-Therapeutic exercises, 97530- Therapeutic  activity, 97112- Neuromuscular re-education, 97535- Self Care, 78295- Manual therapy, [phone]- Gait training, [phone]- Orthotic Fit/training, [phone]- Canalith repositioning, [id number]- Aquatic Therapy, [phone]- Splinting, Patient/Family education, Balance training, Stair training, Taping, Dry Needling, Joint mobilization, Joint manipulation, Spinal manipulation, Spinal mobilization, Scar mobilization, and DME instructions.   PLAN FOR NEXT SESSION: progress per protocol    3:17 PM, 07/16/23 Orah Sonnen Small Machaela Caterino MPT [place] [facility] [place] [phone]

## 2023-07-21 ENCOUNTER — Other Ambulatory Visit (HOSPITAL_BASED_OUTPATIENT_CLINIC_OR_DEPARTMENT_OTHER): Payer: Self-pay

## 2023-07-23 ENCOUNTER — Ambulatory Visit (HOSPITAL_COMMUNITY): Payer: Medicare Other

## 2023-07-23 DIAGNOSIS — M25661 Stiffness of right knee, not elsewhere classified: Secondary | ICD-10-CM

## 2023-07-23 DIAGNOSIS — R262 Difficulty in walking, not elsewhere classified: Secondary | ICD-10-CM

## 2023-07-23 DIAGNOSIS — M25361 Other instability, right knee: Secondary | ICD-10-CM | POA: Diagnosis not present

## 2023-07-23 DIAGNOSIS — M2390 Unspecified internal derangement of unspecified knee: Secondary | ICD-10-CM | POA: Diagnosis not present

## 2023-07-23 NOTE — Therapy (Signed)
OUTPATIENT PHYSICAL THERAPY LOWER EXTREMITY TREATMENT   Patient Name: Martha Montgomery MRN: [id number] DOB:1980-10-11, 42 y.o., female Today's Date: 07/23/2023  END OF SESSION:  PT End of Session - 07/23/23 1432     Visit Number 3    Number of Visits 16    Date for PT Re-Evaluation 08/18/23    Authorization Type [employer] Medicare    PT Start Time 1432    PT Stop Time 0310    PT Time Calculation (min) 758 min    Activity Tolerance Patient tolerated treatment well    Behavior During Therapy WFL for tasks assessed/performed             Past Medical History:  Diagnosis Date   Anxiety    Anxiety disorder    Arthritis    "knees, joints, hands, toes" (09/28/2013)   Depression    Depression, major, single episode, in partial remission (HCC) 10/18/2008   Qualifier: Diagnosis of  By: Lodema Hong MD, Margaret  PHQ 9 score of 7 in 10/2017, not suicidal or homicidal, interested in telepsych however, score of 16 in  02/2018, not suicidal or homicidal, start medication and refer to telepsych Score of 7 in 07/2018    Fibromyalgia    Gastritis nov. 2011   EGD by Dr. Darrick Penna, negative h pylori   GERD (gastroesophageal reflux disease)    Gout    Hypertension x 6 years    Hypothyroidism    Migraines    "15/month average" (09/28/2013)   Obesity (BMI 30.0-34.9)    Spinal stenosis    Thyroid goiter    Past Surgical History:  Procedure Laterality Date   BIOPSY  10/20/2017   Procedure: BIOPSY;  Surgeon: West Bali, MD;  Location: [facility];  Service: Endoscopy;;  gastric duodenal esophagus   CESAREAN SECTION  2008   CHONDROPLASTY  04/02/2012   Procedure: CHONDROPLASTY;  Surgeon: Vickki Hearing, MD;  Location: [facility];  Service: Orthopedics;  Laterality: Left;  of left patella   COLONOSCOPY  11/2010   Dr. Darrick Penna: normal colon, small internal hemorrhoids   COLONOSCOPY WITH PROPOFOL N/A 10/22/2017   Surgeon: West Bali, MD; significant looping of the colon, internal hemorrhoids,  otherwise normal exam.  Recommended 10-year surveillance.   COLONOSCOPY WITH PROPOFOL N/A 10/20/2017   Procedure: COLONOSCOPY WITH PROPOFOL;  Surgeon: West Bali, MD;  Location: [facility];  Service: Endoscopy;  Laterality: N/A;  11:00am   ESOPHAGOGASTRODUODENOSCOPY (EGD) WITH PROPOFOL N/A 10/20/2017   Surgeon: West Bali, MD; no endoscopic abnormality to explain dysphagia s/p empiric dilation, gastritis.  Esophageal biopsies consistent with reflux, gastric biopsies with gastric antral and oxyntic mucosa negative for H. pylori, duodenal biopsies benign.   INCISION AND DRAINAGE ABSCESS  1997; 2005   "under right buttocks; under left arm"   KNEE ARTHROSCOPY Left 2021   KNEE ARTHROSCOPY WITH MEDIAL PATELLAR FEMORAL LIGAMENT RECONSTRUCTION Right 06/16/2023   Procedure: RIGHT KNEE ARTHROSCOPY WITH MEDIAL PATELLAR FEMORAL LIGAMENT RECONSTRUCTION;  Surgeon: Huel Cote, MD;  Location: [facility];  Service: Orthopedics;  Laterality: Right;   SAVORY DILATION N/A 10/20/2017   Procedure: SAVORY DILATION;  Surgeon: West Bali, MD;  Location: [facility];  Service: Endoscopy;  Laterality: N/A;   THYROIDECTOMY N/A 09/28/2013   Procedure: TOTAL THYROIDECTOMY;  Surgeon: Darletta Moll, MD;  Location: [facility] OR;  Service: ENT;  Laterality: N/A;   TIBIA OSTEOTOMY Right 06/16/2023   Procedure: RIGHT KNEE TIBIAL TUBERLE OSTEOTOMY;  Surgeon: Huel Cote, MD;  Location:  [facility] OR;  Service: Orthopedics;  Laterality: Right;   UPPER GI ENDOSCOPY  2011   EGD: gastritis, no h.pylori   Patient Active Problem List   Diagnosis Date Noted   Patellar instability of both knees 06/16/2023   GAD (generalized anxiety disorder) 04/20/2023   Mild depression 04/20/2023   Depression, major, single episode, mild (HCC) 04/20/2023   Decreased vision 04/20/2023   Bladder distension 04/20/2023   Encounter for surveillance of injectable contraceptive 11/07/2022   Encounter for Medicare annual examination with abnormal  findings 08/07/2022   Hyperlipidemia 06/08/2022   Worm infestation 05/12/2022   Generalized joint pain 03/26/2022   Twitching 09/03/2021   Toxic effect of kerosene 09/03/2021   Hypertension    Finger pain, right 07/31/2020   Bilateral groin pain 07/31/2020   Dental caries 04/24/2019   Morbid obesity due to excess calories (HCC) 01/08/2019   Multiple benign lumps of breast 08/17/2018   Hemorrhoids 05/19/2018   Encounter for contraceptive management 03/20/2018   Low vitamin D level 11/30/2017   Bloating 03/05/2017   Bilateral knee pain 01/05/2017   Amenorrhea 01/05/2017   Bilateral hip pain 07/22/2016   Post-surgical hypothyroidism 06/02/2014   Knee pain 06/02/2012   Patellar malalignment syndrome 03/09/2012   IBS (irritable bowel syndrome) 08/29/2011   GERD (gastroesophageal reflux disease) 08/29/2011   Back pain 09/24/2010   Migraine headache 04/02/2010   Constipation 09/12/2009   Allergic rhinitis 06/16/2009    PCP: Syliva Overman  REFERRING PROVIDER: Huel Cote, MD  REFERRING DIAG: M23.90 (ICD-10-CM) - Patellar malalignment syndrome, unspecified laterality  THERAPY DIAG:  Patellar instability of right knee  Difficulty in walking, not elsewhere classified  Knee stiffness, right  Rationale for Evaluation and Treatment: Rehabilitation  ONSET DATE: 06/16/2023  SUBJECTIVE:   SUBJECTIVE STATEMENT: Arrives today with 1 crutch; cousin with her.  Moderate pain mostly in the morning when she wakes and then at night   EVAL:S/p Medial Patella Femoral ligament reconstruction per Dr. Steward Drone 06/16/23; right knee instability; "tearing sound" with steps especially She jarred her knee a couple times; "overstretched the knee" and is hearing some "cracking" but had her immobilizer on both times.   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  Patient arrives in immobilizer with crutches;  states she is "supposed to get a new brace today" but PT  informs her this is likely to be done at her MD office; patient instructed to call her surgeon's office regarding follow up appointment for brace an to get sutures out.    PERTINENT HISTORY: Have had 2 surgeries on left knee already for instability Pinched nerve in back running down leg PAIN:  Are you having pain? Yes: NPRS scale: 5/10 Pain location: right knee, anterior knee Pain description: burning, constant ache; sometimes sharp pain; spasm  Aggravating factors: jarring  Relieving factors: pain meds, ice  PRECAUTIONS: Knee Right knee  RED FLAGS: None noted  WEIGHT BEARING RESTRICTIONS: Yes heel touch only   FALLS:  Has patient fallen in last 6 months? 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  DIAGNOSTIC FINDINGS:   PATIENT SURVEYS:  FOTO 29  COGNITION: Overall cognitive status: Within functional limits for tasks assessed     SENSATION: WFL  EDEMA:  Normal for this time s/p   POSTURE: rounded shoulders, forward head, and weight shift left  PALPATION: General tenderness right knee   LOWER EXTREMITY ROM:  Active ROM Right eval Left eval Right  07/16/23 Right 07/23/23  Hip flexion      Hip extension      Hip abduction      Hip adduction      Hip internal rotation      Hip external rotation      Knee flexion   60 AAROM 88  Knee extension -12 hyperextends -5   Ankle dorsiflexion      Ankle plantarflexion      Ankle inversion      Ankle eversion        (Blank rows = not tested)  LOWER EXTREMITY MMT:  MMT Right eval Left eval  Hip flexion    Hip extension    Hip abduction    Hip adduction    Hip internal rotation    Hip external rotation    Knee flexion    Knee extension Poor quad set   Ankle dorsiflexion    Ankle plantarflexion    Ankle inversion    Ankle eversion     (Blank rows = not tested)  FUNCTIONAL TESTS:  30 seconds chair stand test x 1  GAIT: Distance walked: 60 ft in clinic Assistive device utilized: Crutches Level of assistance: SBA Comments: heel touch weightbearing right leg   TODAY'S TREATMENT:                                                                                                                              DATE:  07/23/23 Supine: Quad sets 5" hold x 10 Ankle pumps x 20 Glute sets 5" hold x 10 Heel slides with strap x 10; AAROM 88 degrees today  Left sidelying: right hip abduction 2 x 10 Clam 2 x 10  Seated: Hamstring stretch 5 x 20" Heel slides for flexion x 10  Gait training with brace opened up to 40 degrees and using 1 crutch  Standing: Heel raises x 10 Slant board 3 x 20"  07/16/23  Review of HEP and goals Supine: Quad sets 5" holds x 10 Ankle pumps x 20 Right heel slides with strap x 10 Adjusted right knee brace Standing: heel raises x 10 Weight shifts x 10    06/30/23 physical therapy evaluation and HEP instruction    PATIENT EDUCATION:  Education details: Patient educated on exam findings, POC, scope of PT, HEP, and what to expect visit. Person educated: Patient Education method: Explanation, Demonstration, and Handouts Education comprehension: verbalized understanding, returned demonstration, verbal cues required, and tactile cues required  HOME EXERCISE PROGRAM: 07/23/23 - Clamshell  - 2  x daily - 7 x weekly - 2 sets - 10 reps - Sidelying Hip Abduction  - 2 x daily - 7 x weekly - 2 sets - 10 reps - Seated Heel Slide  - 2 x daily - 7 x weekly - 2 sets -  10 reps - Seated Hamstring Stretch  - 2 x daily - 7 x weekly - 1 sets - 5 reps - 20 sec hold  Access Code: [email or web address] URL: [email or web address] Date: 06/30/2023 Prepared by: [facility]  Exercises - Supine Quad Set  - 2 x daily - 7 x weekly - 1 sets - 10 reps - 5 sec hold - gentle 4 way patellar glides  - 2 x daily - 7 x weekly - 1 sets - 10 reps - Supine Ankle Pumps  - 2 x daily - 7 x weekly - 2 sets - 10 reps - Seated Ankle Pumps  - 2 x daily - 7 x weekly - 2 sets - 10 reps - Supine Active Straight Leg Raise  - 2 x daily - 7 x weekly - 1 sets - 10 reps Access Code: [email or web address] URL: [email or web address] Date: 07/16/2023 Prepared by: [facility]  Exercises - Supine Quad Set  - 2 x daily - 7 x weekly - 1 sets - 10 reps - 5 sec hold - gentle 4 way patellar glides  - 2 x daily - 7 x weekly - 1 sets - 10 reps - Supine Ankle Pumps  - 2 x daily - 7 x weekly - 2 sets - 10 reps - Seated Ankle Pumps  - 2 x daily - 7 x weekly - 2 sets - 10 reps - Supine Active Straight Leg Raise  - 2 x daily - 7 x weekly - 1 sets - 10 reps - Supine Heel Slide with Strap  - 1 x daily - 7 x weekly - 1 sets - 10 reps - Standing Heel Raise with Support  - 1 x daily - 7 x weekly - 1 sets - 10 reps - Side to Side Weight Shift with Counter Support  - 1 x daily - 7 x weekly - 1 sets - 10 reps ASSESSMENT:  CLINICAL IMPRESSION: Today's session with focus on right lower extremity and knee mobility and lower extremity strengthening per TTO protocol,  added hip/glute strengthening and hamstring stretching to HEP.  Patient with good progress with knee flexion mobility.  Opened up brace to 45 degrees per protocol.  Able to don shoe on right side modified independently today.   Needs cues to improve heel strike and toe off and posture with walking with 1 crutch and brace opened to 40.   Patient will benefit from continued skilled therapy services to address deficits and promote return to optimal function.        Eval:Patient is a 42 y.o. female who was seen today for physical therapy evaluation and treatment for s/p M23.90 (ICD-10-CM) - Patellar malalignment syndrome, unspecified laterality.  Patient demonstrates muscle weakness, reduced ROM, and fascial restrictions which are likely contributing to symptoms of pain and are negatively impacting patient ability to perform ADLs and functional mobility tasks. Patient will benefit from skilled physical therapy services to address these deficits to reduce pain and improve level of function with ADLs and functional mobility tasks.   OBJECTIVE IMPAIRMENTS: Abnormal gait, decreased activity tolerance, decreased endurance, decreased knowledge of use of DME, decreased mobility, difficulty walking, decreased ROM, decreased strength, increased edema, increased fascial restrictions, impaired perceived functional  ability, impaired flexibility, and pain.   ACTIVITY LIMITATIONS: lifting, bending, sitting, standing, squatting, sleeping, stairs, transfers, bed mobility, bathing, toileting, dressing, locomotion level, and caring for others  PARTICIPATION LIMITATIONS: meal prep, cleaning, laundry, driving, shopping, community activity, occupation, and church  PERSONAL FACTORS:  back pain and left knee pain  are also affecting patient's functional outcome.   REHAB POTENTIAL: Good  CLINICAL DECISION MAKING: Stable/uncomplicated  EVALUATION COMPLEXITY: Low   GOALS: Goals reviewed with patient? No  SHORT TERM GOALS: Target date: 07/28/2023 patient will be independent with initial HEP  Baseline: Goal status: IN progress   2.  Patient will improve right knee mobility to 0 to 45 degrees to promote progression toward normalizing gait pattern Baseline:  Goal status: in progress    LONG TERM GOALS: Target date: 08/18/2023  Patient will improve FOTO score by 10 points to demonstrate improved perceived functional mobility  Baseline: 29 Goal status: in  progress  2.  Patient will ambulate without AD and minimal gait deviation household and short community distances to safely grocery shop Baseline:  Goal status: in progress  3.  Patient will increase right knee mobility to 0 to 120 to promote normal navigation of steps; step over step pattern  Baseline: see above Goal status: IN progress  4.  Patient will increase right  leg MMTs to 4+ to 5/5 without pain to promote return to ambulation community distances with minimal deviation. (Goal Date 12 weeks out per protocol)  Baseline: see above Goal status: IN progress  PLAN:  PT FREQUENCY: 2x/week  PT DURATION: 8 weeks  PLANNED INTERVENTIONS: 97164- PT Re-evaluation, 97110-Therapeutic exercises, 97530- Therapeutic activity, 97112- Neuromuscular re-education, 97535- Self Care, 54098- Manual therapy, [phone]- Gait training, [phone]- Orthotic Fit/training, [phone]- Canalith repositioning, [id number]- Aquatic Therapy, [phone]- Splinting, Patient/Family education, Balance training, Stair training, Taping, Dry Needling, Joint mobilization, Joint manipulation, Spinal manipulation, Spinal mobilization, Scar mobilization, and DME instructions.   PLAN FOR NEXT SESSION: progress per protocol ; patient will be s/p 6 weeks on 12/31   3:10 PM, 07/23/23 Larkin Alfred Small Daneesha Quinteros MPT [facility] [place] [phone]

## 2023-07-24 ENCOUNTER — Encounter (HOSPITAL_COMMUNITY): Payer: Medicare Other

## 2023-07-24 ENCOUNTER — Other Ambulatory Visit: Payer: Self-pay | Admitting: Family Medicine

## 2023-07-28 ENCOUNTER — Encounter (HOSPITAL_COMMUNITY): Payer: Medicare Other

## 2023-07-30 ENCOUNTER — Encounter: Payer: Self-pay | Admitting: Family Medicine

## 2023-07-30 ENCOUNTER — Other Ambulatory Visit (INDEPENDENT_AMBULATORY_CARE_PROVIDER_SITE_OTHER): Payer: Medicare Other

## 2023-07-30 ENCOUNTER — Other Ambulatory Visit: Payer: Self-pay | Admitting: Family Medicine

## 2023-07-30 ENCOUNTER — Ambulatory Visit: Payer: Medicare Other | Admitting: Family Medicine

## 2023-07-30 VITALS — BP 128/82 | HR 90 | Ht 63.0 in | Wt 218.1 lb

## 2023-07-30 DIAGNOSIS — E890 Postprocedural hypothyroidism: Secondary | ICD-10-CM | POA: Diagnosis not present

## 2023-07-30 DIAGNOSIS — I10 Essential (primary) hypertension: Secondary | ICD-10-CM

## 2023-07-30 DIAGNOSIS — N912 Amenorrhea, unspecified: Secondary | ICD-10-CM

## 2023-07-30 DIAGNOSIS — Z1231 Encounter for screening mammogram for malignant neoplasm of breast: Secondary | ICD-10-CM

## 2023-07-30 DIAGNOSIS — F411 Generalized anxiety disorder: Secondary | ICD-10-CM | POA: Diagnosis not present

## 2023-07-30 DIAGNOSIS — E7849 Other hyperlipidemia: Secondary | ICD-10-CM | POA: Diagnosis not present

## 2023-07-30 DIAGNOSIS — Z3042 Encounter for surveillance of injectable contraceptive: Secondary | ICD-10-CM

## 2023-07-30 MED ORDER — BUSPIRONE HCL 5 MG PO TABS: 5.0000 mg | ORAL_TABLET | Freq: Three times a day (TID) | ORAL | 2 refills | Status: DC

## 2023-07-30 MED ORDER — ESCITALOPRAM OXALATE 20 MG PO TABS: 20.0000 mg | ORAL_TABLET | Freq: Every day | ORAL | 2 refills | Status: DC

## 2023-07-30 MED ORDER — MEDROXYPROGESTERONE ACETATE 150 MG/ML IM SUSP
150.0000 mg | Freq: Once | INTRAMUSCULAR | Status: AC
  Administered 2023-07-30 (×2): 150 mg via INTRAMUSCULAR

## 2023-07-30 NOTE — Patient Instructions (Signed)
 F/u in 10 weeks, call if you needme sooner  Higher dose lexapro  20 mg daily start today, new additional med Buspar  for anxiety also  Please get fasting labs the week of 08/09/2022  Blood pressure is good  Need HPV and TDaP vaccine  Pls schedule mammogram due in March and contact pt with appt info in am

## 2023-07-31 ENCOUNTER — Encounter (HOSPITAL_COMMUNITY): Payer: Medicare Other

## 2023-08-03 ENCOUNTER — Telehealth: Payer: Self-pay | Admitting: Orthopaedic Surgery

## 2023-08-03 ENCOUNTER — Encounter (HOSPITAL_COMMUNITY): Payer: Medicare Other

## 2023-08-03 NOTE — Assessment & Plan Note (Signed)
  Patient re-educated about  the importance of commitment to a  minimum of 150 minutes of exercise per week as able.  The importance of healthy food choices with portion control discussed, as well as eating regularly and within a 12 hour window most days. The need to choose clean , green food 50 to 75% of the time is discussed, as well as to make water the primary drink and set a goal of 64 ounces water daily.       07/30/2023    4:03 PM 06/16/2023   11:04 AM 06/09/2023    1:07 PM  Weight /BMI  Weight 218 lb 1.9 oz 208 lb 209 lb  Height 5' 3 (1.6 m) 5' 3 (1.6 m) 5' 3 (1.6 m)  BMI 38.64 kg/m2 36.85 kg/m2 37.02 kg/m2    Deteriorated has been immobile due to recent knee surgery

## 2023-08-03 NOTE — Assessment & Plan Note (Signed)
 DASH diet and commitment to daily physical activity for a minimum of 30 minutes discussed and encouraged, as a part of hypertension management. The importance of attaining a healthy weight is also discussed.     07/30/2023    4:55 PM 07/30/2023    4:03 PM 06/16/2023    4:45 PM 06/16/2023    4:30 PM 06/16/2023    4:15 PM 06/16/2023    4:00 PM 06/16/2023   12:50 PM  BP/Weight  Systolic BP 128 147 124 130 133 128 134  Diastolic BP 82 96 90 95 91 88 98  Wt. (Lbs)  218.12       BMI  38.64 kg/m2          Controlled, no change in medication

## 2023-08-03 NOTE — Progress Notes (Signed)
 SPARROW SIRACUSA     MRN: [id number]      DOB: 03-08-1981  Chief Complaint  Patient presents with   Follow-up    Recent knee surgery follow up    HPI Ms. Margolis is here for follow up and re-evaluation of chronic medical conditions, medication management and review of any available recent lab and radiology data.  Preventive health is updated, specifically  Cancer screening and Immunization.   Questions or concerns regarding consultations or procedures which the PT has had in the interim are  addressed. The PT denies any adverse reactions to current medications since the last visit.  There are no new concerns.  There are no specific complaints   ROS Denies recent fever or chills. Denies sinus pressure, nasal congestion, ear pain or sore throat. Denies chest congestion, productive cough or wheezing. Denies chest pains, palpitations and leg swelling Denies abdominal pain, nausea, vomiting,diarrhea or constipation.   Denies dysuria, frequency, hesitancy or incontinence. Denies joint pain, swelling and limitation in mobility. Denies headaches, seizures, numbness, or tingling. Denies depression, anxiety or insomnia. Denies skin break down or rash.   PE  BP 128/82   Pulse 90   Ht 5' 3 (1.6 m)   Wt 218 lb 1.9 oz (98.9 kg)   SpO2 97%   BMI 38.64 kg/m   Patient alert and oriented and in no cardiopulmonary distress.  HEENT: No facial asymmetry, EOMI,     Neck supple .  Chest: Clear to auscultation bilaterally.  CVS: S1, S2 no murmurs, no S3.Regular rate.  ABD: Soft non tender.   Ext: No edema  MS: Adequate ROM spine, shoulders, hips and knees.  Skin: Intact, no ulcerations or rash noted.  Psych: Good eye contact, normal affect. Memory intact not anxious or depressed appearing.  CNS: CN 2-12 intact, power,  normal throughout.no focal deficits noted.   Assessment & Plan  GAD (generalized anxiety disorder) Still has uncontrolled anxiety, increase dose of lexapro  and  add buspar   Hypertension DASH diet and commitment to daily physical activity for a minimum of 30 minutes discussed and encouraged, as a part of hypertension management. The importance of attaining a healthy weight is also discussed.     07/30/2023    4:55 PM 07/30/2023    4:03 PM 06/16/2023    4:45 PM 06/16/2023    4:30 PM 06/16/2023    4:15 PM 06/16/2023    4:00 PM 06/16/2023   12:50 PM  BP/Weight  Systolic BP 128 147 124 130 133 128 134  Diastolic BP 82 96 90 95 91 88 98  Wt. (Lbs)  218.12       BMI  38.64 kg/m2          Controlled, no change in medication   Morbid obesity due to excess calories [facility])  Patient re-educated about  the importance of commitment to a  minimum of 150 minutes of exercise per week as able.  The importance of healthy food choices with portion control discussed, as well as eating regularly and within a 12 hour window most days. The need to choose clean , green food 50 to 75% of the time is discussed, as well as to make water the primary drink and set a goal of 64 ounces water daily.       07/30/2023    4:03 PM 06/16/2023   11:04 AM 06/09/2023    1:07 PM  Weight /BMI  Weight 218 lb 1.9 oz 208 lb 209 lb  Height 5'  3 (1.6 m) 5' 3 (1.6 m) 5' 3 (1.6 m)  BMI 38.64 kg/m2 36.85 kg/m2 37.02 kg/m2    Deteriorated has been immobile due to recent knee surgery  Hyperlipidemia Hyperlipidemia:Low fat diet discussed and encouraged.   Lipid Panel  Lab Results  Component Value Date   CHOL 245 (H) 01/22/2023   HDL 56 01/22/2023   LDLCALC 175 (H) 01/22/2023   TRIG 84 01/22/2023   CHOLHDL 4.4 01/22/2023     Updated lab needed at/ before next visit. Uncontrolled when last checked  Post-surgical hypothyroidism Updated lab needed at/ before next visit.

## 2023-08-03 NOTE — Telephone Encounter (Signed)
 Patient called and wants to know if its ok to drive. CB#[phone]

## 2023-08-03 NOTE — Assessment & Plan Note (Signed)
 Updated lab needed at/ before next visit.

## 2023-08-03 NOTE — Assessment & Plan Note (Signed)
 Hyperlipidemia:Low fat diet discussed and encouraged.   Lipid Panel  Lab Results  Component Value Date   CHOL 245 (H) 01/22/2023   HDL 56 01/22/2023   LDLCALC 175 (H) 01/22/2023   TRIG 84 01/22/2023   CHOLHDL 4.4 01/22/2023     Updated lab needed at/ before next visit. Uncontrolled when last checked

## 2023-08-03 NOTE — Telephone Encounter (Signed)
 Called patient informing her there is no clearance to drive and Dr. Serena Croissant rule of thumb is safe ambulation and exiting the car if something were to happen and recommended FWB status prior to driving.

## 2023-08-03 NOTE — Assessment & Plan Note (Signed)
 Still has uncontrolled anxiety, increase dose of lexapro and add buspar

## 2023-08-05 ENCOUNTER — Encounter (HOSPITAL_COMMUNITY): Payer: Self-pay

## 2023-08-05 ENCOUNTER — Ambulatory Visit (HOSPITAL_COMMUNITY): Payer: Medicare Other | Attending: Orthopaedic Surgery

## 2023-08-05 DIAGNOSIS — M25661 Stiffness of right knee, not elsewhere classified: Secondary | ICD-10-CM | POA: Diagnosis not present

## 2023-08-05 DIAGNOSIS — M25361 Other instability, right knee: Secondary | ICD-10-CM | POA: Insufficient documentation

## 2023-08-05 DIAGNOSIS — R262 Difficulty in walking, not elsewhere classified: Secondary | ICD-10-CM | POA: Insufficient documentation

## 2023-08-05 NOTE — Therapy (Signed)
 OUTPATIENT PHYSICAL THERAPY LOWER EXTREMITY TREATMENT   Patient Name: Martha Montgomery MRN: [id number] DOB:23-Dec-1980, 43 y.o., female Today's Date: 08/05/2023  END OF SESSION:  PT End of Session - 08/05/23 1428     Visit Number 4    Number of Visits 16    Date for PT Re-Evaluation 08/18/23    Authorization Type [employer] Medicare    PT Start Time 1349    PT Stop Time 1428    PT Time Calculation (min) 39 min    Activity Tolerance Patient tolerated treatment well    Behavior During Therapy WFL for tasks assessed/performed              Past Medical History:  Diagnosis Date   Anxiety    Anxiety disorder    Arthritis    knees, joints, hands, toes (09/28/2013)   Depression    Depression, major, single episode, in partial remission (HCC) 10/18/2008   Qualifier: Diagnosis of  By: Antonetta MD, Margaret  PHQ 9 score of 7 in 10/2017, not suicidal or homicidal, interested in telepsych however, score of 16 in  02/2018, not suicidal or homicidal, start medication and refer to telepsych Score of 7 in 07/2018    Fibromyalgia    Gastritis nov. 2011   EGD by Dr. Harvey, negative h pylori   GERD (gastroesophageal reflux disease)    Gout    Hypertension x 6 years    Hypothyroidism    Migraines    15/month average (09/28/2013)   Obesity (BMI 30.0-34.9)    Spinal stenosis    Thyroid  goiter    Past Surgical History:  Procedure Laterality Date   BIOPSY  10/20/2017   Procedure: BIOPSY;  Surgeon: Harvey Margo CROME, MD;  Location: [facility];  Service: Endoscopy;;  gastric duodenal esophagus   CESAREAN SECTION  2008   CHONDROPLASTY  04/02/2012   Procedure: CHONDROPLASTY;  Surgeon: Taft FORBES Minerva, MD;  Location: [facility];  Service: Orthopedics;  Laterality: Left;  of left patella   COLONOSCOPY  11/2010   Dr. Harvey: normal colon, small internal hemorrhoids   COLONOSCOPY WITH PROPOFOL  N/A 10/22/2017   Surgeon: Harvey Margo CROME, MD; significant looping of the colon, internal hemorrhoids,  otherwise normal exam.  Recommended 10-year surveillance.   COLONOSCOPY WITH PROPOFOL  N/A 10/20/2017   Procedure: COLONOSCOPY WITH PROPOFOL ;  Surgeon: Harvey Margo CROME, MD;  Location: [facility];  Service: Endoscopy;  Laterality: N/A;  11:00am   ESOPHAGOGASTRODUODENOSCOPY (EGD) WITH PROPOFOL  N/A 10/20/2017   Surgeon: Harvey Margo CROME, MD; no endoscopic abnormality to explain dysphagia s/p empiric dilation, gastritis.  Esophageal biopsies consistent with reflux, gastric biopsies with gastric antral and oxyntic mucosa negative for H. pylori, duodenal biopsies benign.   INCISION AND DRAINAGE ABSCESS  1997; 2005   under right buttocks; under left arm   KNEE ARTHROSCOPY Left 2021   KNEE ARTHROSCOPY WITH MEDIAL PATELLAR FEMORAL LIGAMENT RECONSTRUCTION Right 06/16/2023   Procedure: RIGHT KNEE ARTHROSCOPY WITH MEDIAL PATELLAR FEMORAL LIGAMENT RECONSTRUCTION;  Surgeon: Genelle Standing, MD;  Location: [facility];  Service: Orthopedics;  Laterality: Right;   SAVORY DILATION N/A 10/20/2017   Procedure: SAVORY DILATION;  Surgeon: Harvey Margo CROME, MD;  Location: [facility];  Service: Endoscopy;  Laterality: N/A;   THYROIDECTOMY N/A 09/28/2013   Procedure: TOTAL THYROIDECTOMY;  Surgeon: Ana LELON Moccasin, MD;  Location: [facility] OR;  Service: ENT;  Laterality: N/A;   TIBIA OSTEOTOMY Right 06/16/2023   Procedure: RIGHT KNEE TIBIAL TUBERLE OSTEOTOMY;  Surgeon: Genelle Standing, MD;  Location: [facility];  Service: Orthopedics;  Laterality: Right;   UPPER GI ENDOSCOPY  2011   EGD: gastritis, no h.pylori   Patient Active Problem List   Diagnosis Date Noted   Patellar instability of both knees 06/16/2023   GAD (generalized anxiety disorder) 04/20/2023   Mild depression 04/20/2023   Depression, major, single episode, mild (HCC) 04/20/2023   Decreased vision 04/20/2023   Bladder distension 04/20/2023   Encounter for surveillance of injectable contraceptive 11/07/2022   Encounter for Medicare annual examination with abnormal  findings 08/07/2022   Hyperlipidemia 06/08/2022   Worm infestation 05/12/2022   Generalized joint pain 03/26/2022   Twitching 09/03/2021   Toxic effect of kerosene 09/03/2021   Hypertension    Finger pain, right 07/31/2020   Bilateral groin pain 07/31/2020   Dental caries 04/24/2019   Morbid obesity due to excess calories (HCC) 01/08/2019   Multiple benign lumps of breast 08/17/2018   Hemorrhoids 05/19/2018   Encounter for contraceptive management 03/20/2018   Low vitamin D  level 11/30/2017   Bloating 03/05/2017   Bilateral knee pain 01/05/2017   Amenorrhea 01/05/2017   Bilateral hip pain 07/22/2016   Post-surgical hypothyroidism 06/02/2014   Knee pain 06/02/2012   Patellar malalignment syndrome 03/09/2012   IBS (irritable bowel syndrome) 08/29/2011   GERD (gastroesophageal reflux disease) 08/29/2011   Back pain 09/24/2010   Migraine headache 04/02/2010   Constipation 09/12/2009   Allergic rhinitis 06/16/2009    PCP: Rollene Pesa  REFERRING PROVIDER: Genelle Standing, MD  REFERRING DIAG: M23.90 (ICD-10-CM) - Patellar malalignment syndrome, unspecified laterality  THERAPY DIAG:  Patellar instability of right knee  Difficulty in walking, not elsewhere classified  Knee stiffness, right  Rationale for Evaluation and Treatment: Rehabilitation  ONSET DATE: 06/16/2023  SUBJECTIVE:   SUBJECTIVE STATEMENT: Arrived today with 1 crutch with cousin present this session.  Reports she is very sore today, no real pain.     EVAL:S/p Medial Patella Femoral ligament reconstruction per Dr. Genelle 06/16/23; right knee instability; tearing sound with steps especially She jarred her knee a couple times; overstretched the knee and is hearing some cracking but had her immobilizer on both times.   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  Patient arrives in immobilizer with crutches;  states she is supposed to get a new brace today but PT informs her  this is likely to be done at her MD office; patient instructed to call her surgeon's office regarding follow up appointment for brace an to get sutures out.    PERTINENT HISTORY: Have had 2 surgeries on left knee already for instability Pinched nerve in back running down leg PAIN:  Are you having pain? Yes: NPRS scale: 4/10 Pain location: right knee, anterior knee Pain description: burning, constant ache; sometimes sharp pain; spasm  Aggravating factors: jarring  Relieving factors: pain meds, ice  PRECAUTIONS: Knee Right knee  RED FLAGS: None noted  WEIGHT BEARING RESTRICTIONS: Yes heel touch only   FALLS:  Has patient fallen in last 6 months? 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  DIAGNOSTIC FINDINGS:   PATIENT SURVEYS:  FOTO 29  COGNITION: Overall cognitive status: Within functional limits for tasks assessed     SENSATION: WFL  EDEMA:  Normal for this time s/p   POSTURE: rounded shoulders, forward head, and weight shift left  PALPATION: General tenderness right knee   LOWER EXTREMITY ROM:  Active ROM Right eval Left eval Right  07/16/23 Right 07/23/23 Right 08/05/23  Hip flexion       Hip extension       Hip abduction       Hip adduction       Hip internal rotation       Hip external rotation       Knee flexion   60 AAROM 88 112  Knee extension -12 hyperextends -5  0  Ankle dorsiflexion       Ankle plantarflexion       Ankle inversion       Ankle  eversion        (Blank rows = not tested)  LOWER EXTREMITY MMT:  MMT Right eval Left eval  Hip flexion    Hip extension    Hip abduction    Hip adduction    Hip internal rotation    Hip external rotation    Knee flexion    Knee extension Poor quad set   Ankle dorsiflexion    Ankle plantarflexion    Ankle inversion    Ankle eversion     (Blank rows = not tested)  FUNCTIONAL TESTS:  30 seconds chair stand test x 1  GAIT: Distance walked: 60 ft in clinic Assistive device utilized: Crutches Level of assistance: SBA Comments: heel touch weightbearing right leg   TODAY'S TREATMENT:                                                                                                                              DATE:  08/05/23: Supine: Quad set 10x 5 SAQ 10x 5 AROM 0-112 SLR 10x  no brace or AD 370  Standing: Heel raises 15x Toe raises 15x Minisquats 10x 4in step up with BUE A TKE with GTB 10x5  Seated STS eccentric control 10x no HHA  07/23/23 Supine: Quad sets 5 hold x 10 Ankle pumps x 20 Glute sets 5 hold x 10 Heel slides with strap x 10; AAROM 88 degrees today  Left sidelying: right hip abduction 2 x 10 Clam 2 x 10  Seated: Hamstring stretch 5 x 20 Heel slides for flexion x 10  Gait training with brace opened up to 40 degrees and using 1 crutch  Standing: Heel raises x 10 Slant board 3 x 20  07/16/23  Review of HEP and goals Supine: Quad sets 5 holds x 10 Ankle pumps x 20 Right heel slides with strap x 10 Adjusted right knee brace Standing: heel raises x 10 Weight shifts x 10  06/30/23 physical therapy evaluation and HEP instruction    PATIENT EDUCATION:  Education details: Patient educated on exam findings, POC, scope of PT, HEP, and what to expect visit. Person educated: Patient Education method: Explanation, Demonstration, and Handouts Education comprehension: verbalized understanding, returned demonstration, verbal  cues required, and tactile cues required  HOME EXERCISE PROGRAM: 07/23/23 - Clamshell  - 2 x daily - 7 x weekly - 2 sets - 10 reps - Sidelying Hip Abduction  - 2 x daily - 7 x weekly - 2 sets - 10 reps - Seated Heel Slide  - 2 x daily - 7 x weekly - 2 sets - 10 reps - Seated Hamstring Stretch  - 2 x daily - 7 x weekly - 1 sets - 5 reps - 20 sec hold  Access Code: [email or web address] URL: [email or web address] Date: 06/30/2023 Prepared by: [facility]  Exercises - Supine Quad Set  - 2 x daily - 7 x weekly - 1 sets - 10 reps - 5 sec hold - gentle 4 way patellar glides  - 2 x daily - 7 x weekly - 1 sets - 10 reps - Supine Ankle Pumps  - 2 x daily - 7 x weekly - 2 sets - 10 reps - Seated Ankle Pumps  - 2 x daily - 7 x weekly - 2 sets - 10 reps - Supine Active Straight Leg Raise  - 2 x daily - 7 x weekly - 1 sets - 10 reps Access Code: [email or web address] URL: [email or web address] Date: 07/16/2023 Prepared by: [facility]  Exercises - Supine Quad Set  - 2 x daily - 7 x weekly - 1 sets - 10 reps - 5 sec hold - gentle 4 way patellar glides  - 2 x daily - 7 x weekly - 1 sets - 10 reps - Supine Ankle Pumps  - 2 x daily - 7 x weekly - 2 sets - 10 reps - Seated Ankle Pumps  - 2 x daily - 7 x weekly - 2 sets - 10 reps - Supine Active Straight Leg Raise  - 2 x daily - 7 x weekly - 1 sets - 10 reps - Supine Heel Slide with Strap  - 1 x daily - 7 x weekly - 1 sets - 10 reps - Standing Heel Raise with Support  - 1 x daily - 7 x weekly - 1 sets - 10 reps - Side to Side Weight Shift with Counter Support  - 1 x daily - 7 x weekly - 1 sets - 10 reps ASSESSMENT:  CLINICAL IMPRESSION: Pt at 7 week post-op and progressing well towards goals.  Sessoin focus on knee mobility and proximal strengthening per TTO protocol.  Pt improved AROM 0-112 degrees.  Pt protocol DC brace and gait training complete with no AD.  Min cueing to improve heel to toe mechanics and equalize stance phase to improve gait  mechaincs.  Progressed to closed chain functional strengthening exercises with good form and mechanics noted.  No reports of increased pain through session.      Eval:Patient is a 43 y.o. female who was seen today for physical therapy evaluation and treatment for s/p M23.90 (ICD-10-CM) - Patellar malalignment syndrome, unspecified laterality.  Patient demonstrates muscle weakness, reduced ROM, and fascial restrictions which are likely contributing to symptoms of pain and are negatively impacting patient ability to perform ADLs and functional mobility tasks. Patient will benefit from skilled physical therapy services to address these deficits to reduce pain and improve  level of function with ADLs and functional mobility tasks.   OBJECTIVE IMPAIRMENTS: Abnormal gait, decreased activity tolerance, decreased endurance, decreased knowledge of use of DME, decreased mobility, difficulty walking, decreased ROM, decreased strength, increased edema, increased fascial restrictions, impaired perceived functional ability, impaired flexibility, and pain.   ACTIVITY LIMITATIONS: lifting, bending, sitting, standing, squatting, sleeping, stairs, transfers, bed mobility, bathing, toileting, dressing, locomotion level, and caring for others  PARTICIPATION LIMITATIONS: meal prep, cleaning, laundry, driving, shopping, community activity, occupation, and church  PERSONAL FACTORS:  back pain and left knee pain  are also affecting patient's functional outcome.   REHAB POTENTIAL: Good  CLINICAL DECISION MAKING: Stable/uncomplicated  EVALUATION COMPLEXITY: Low   GOALS: Goals reviewed with patient? No  SHORT TERM GOALS: Target date: 07/28/2023 patient will be independent with initial HEP  Baseline: Goal status: IN progress   2.  Patient will improve right knee mobility to 0 to 45 degrees to promote progression toward normalizing gait pattern Baseline:  Goal status: in progress    LONG TERM GOALS: Target  date: 08/18/2023  Patient will improve FOTO score by 10 points to demonstrate improved perceived functional mobility  Baseline: 29 Goal status: in progress  2.  Patient will ambulate without AD and minimal gait deviation household and short community distances to safely grocery shop Baseline:  Goal status: in progress  3.  Patient will increase right knee mobility to 0 to 120 to promote normal navigation of steps; step over step pattern  Baseline: see above Goal status: IN progress  4.  Patient will increase right  leg MMTs to 4+ to 5/5 without pain to promote return to ambulation community distances with minimal deviation. (Goal Date 12 weeks out per protocol)  Baseline: see above Goal status: IN progress  PLAN:  PT FREQUENCY: 2x/week  PT DURATION: 8 weeks  PLANNED INTERVENTIONS: [id number]- PT Re-evaluation, 97110-Therapeutic exercises, 97530- Therapeutic activity, 97112- Neuromuscular re-education, 97535- Self Care, [id number]- Manual therapy, [phone]- Gait training, [phone]- Orthotic Fit/training, [phone]- Canalith repositioning, [id number]- Aquatic Therapy, [phone]- Splinting, Patient/Family education, Balance training, Stair training, Taping, Dry Needling, Joint mobilization, Joint manipulation, Spinal manipulation, Spinal mobilization, Scar mobilization, and DME instructions.   PLAN FOR NEXT SESSION: progress per protocol ; patient will be s/p 8 weeks on 09/11/23.  Augustin Mclean, LPTA/CLT; CBIS [phone]  Mclean Augustin Amble, PTA 08/05/2023, 2:38 PM  2:38 PM, 08/05/23

## 2023-08-10 ENCOUNTER — Ambulatory Visit (HOSPITAL_COMMUNITY): Payer: Medicare Other

## 2023-08-10 DIAGNOSIS — R262 Difficulty in walking, not elsewhere classified: Secondary | ICD-10-CM | POA: Diagnosis not present

## 2023-08-10 DIAGNOSIS — M25361 Other instability, right knee: Secondary | ICD-10-CM

## 2023-08-10 DIAGNOSIS — M25661 Stiffness of right knee, not elsewhere classified: Secondary | ICD-10-CM

## 2023-08-10 NOTE — Therapy (Signed)
 OUTPATIENT PHYSICAL THERAPY LOWER EXTREMITY TREATMENT   Patient Name: Martha Montgomery MRN: [id number] DOB:03-21-1981, 43 y.o., female Today's Date: 08/10/2023  END OF SESSION:  PT End of Session - 08/10/23 1346     Visit Number 5    Number of Visits 16    Date for PT Re-Evaluation 08/18/23    Authorization Type [employer] Medicare    PT Start Time 1346    PT Stop Time 1425    PT Time Calculation (min) 39 min    Activity Tolerance Patient tolerated treatment well    Behavior During Therapy WFL for tasks assessed/performed              Past Medical History:  Diagnosis Date   Anxiety    Anxiety disorder    Arthritis    knees, joints, hands, toes (09/28/2013)   Depression    Depression, major, single episode, in partial remission (HCC) 10/18/2008   Qualifier: Diagnosis of  By: Antonetta MD, Margaret  PHQ 9 score of 7 in 10/2017, not suicidal or homicidal, interested in telepsych however, score of 16 in  02/2018, not suicidal or homicidal, start medication and refer to telepsych Score of 7 in 07/2018    Fibromyalgia    Gastritis nov. 2011   EGD by Dr. Harvey, negative h pylori   GERD (gastroesophageal reflux disease)    Gout    Hypertension x 6 years    Hypothyroidism    Migraines    15/month average (09/28/2013)   Obesity (BMI 30.0-34.9)    Spinal stenosis    Thyroid  goiter    Past Surgical History:  Procedure Laterality Date   BIOPSY  10/20/2017   Procedure: BIOPSY;  Surgeon: Harvey Margo CROME, MD;  Location: [facility];  Service: Endoscopy;;  gastric duodenal esophagus   CESAREAN SECTION  2008   CHONDROPLASTY  04/02/2012   Procedure: CHONDROPLASTY;  Surgeon: Taft FORBES Minerva, MD;  Location: [facility];  Service: Orthopedics;  Laterality: Left;  of left patella   COLONOSCOPY  11/2010   Dr. Harvey: normal colon, small internal hemorrhoids   COLONOSCOPY WITH PROPOFOL  N/A 10/22/2017   Surgeon: Harvey Margo CROME, MD; significant looping of the colon, internal hemorrhoids,  otherwise normal exam.  Recommended 10-year surveillance.   COLONOSCOPY WITH PROPOFOL  N/A 10/20/2017   Procedure: COLONOSCOPY WITH PROPOFOL ;  Surgeon: Harvey Margo CROME, MD;  Location: [facility];  Service: Endoscopy;  Laterality: N/A;  11:00am   ESOPHAGOGASTRODUODENOSCOPY (EGD) WITH PROPOFOL  N/A 10/20/2017   Surgeon: Harvey Margo CROME, MD; no endoscopic abnormality to explain dysphagia s/p empiric dilation, gastritis.  Esophageal biopsies consistent with reflux, gastric biopsies with gastric antral and oxyntic mucosa negative for H. pylori, duodenal biopsies benign.   INCISION AND DRAINAGE ABSCESS  1997; 2005   under right buttocks; under left arm   KNEE ARTHROSCOPY Left 2021   KNEE ARTHROSCOPY WITH MEDIAL PATELLAR FEMORAL LIGAMENT RECONSTRUCTION Right 06/16/2023   Procedure: RIGHT KNEE ARTHROSCOPY WITH MEDIAL PATELLAR FEMORAL LIGAMENT RECONSTRUCTION;  Surgeon: Genelle Standing, MD;  Location: [facility];  Service: Orthopedics;  Laterality: Right;   SAVORY DILATION N/A 10/20/2017   Procedure: SAVORY DILATION;  Surgeon: Harvey Margo CROME, MD;  Location: [facility];  Service: Endoscopy;  Laterality: N/A;   THYROIDECTOMY N/A 09/28/2013   Procedure: TOTAL THYROIDECTOMY;  Surgeon: Ana LELON Moccasin, MD;  Location: [facility] OR;  Service: ENT;  Laterality: N/A;   TIBIA OSTEOTOMY Right 06/16/2023   Procedure: RIGHT KNEE TIBIAL TUBERLE OSTEOTOMY;  Surgeon: Genelle Standing, MD;  Location: [facility] OR;  Service: Orthopedics;  Laterality: Right;   UPPER GI ENDOSCOPY  2011   EGD: gastritis, no h.pylori   Patient Active Problem List   Diagnosis Date Noted   Patellar instability of both knees 06/16/2023   GAD (generalized anxiety disorder) 04/20/2023   Mild depression 04/20/2023   Depression, major, single episode, mild (HCC) 04/20/2023   Decreased vision 04/20/2023   Bladder distension 04/20/2023   Encounter for surveillance of injectable contraceptive 11/07/2022   Encounter for Medicare annual examination with abnormal  findings 08/07/2022   Hyperlipidemia 06/08/2022   Worm infestation 05/12/2022   Generalized joint pain 03/26/2022   Twitching 09/03/2021   Toxic effect of kerosene 09/03/2021   Hypertension    Finger pain, right 07/31/2020   Bilateral groin pain 07/31/2020   Dental caries 04/24/2019   Morbid obesity due to excess calories (HCC) 01/08/2019   Multiple benign lumps of breast 08/17/2018   Hemorrhoids 05/19/2018   Encounter for contraceptive management 03/20/2018   Low vitamin D  level 11/30/2017   Bloating 03/05/2017   Bilateral knee pain 01/05/2017   Amenorrhea 01/05/2017   Bilateral hip pain 07/22/2016   Post-surgical hypothyroidism 06/02/2014   Knee pain 06/02/2012   Patellar malalignment syndrome 03/09/2012   IBS (irritable bowel syndrome) 08/29/2011   GERD (gastroesophageal reflux disease) 08/29/2011   Back pain 09/24/2010   Migraine headache 04/02/2010   Constipation 09/12/2009   Allergic rhinitis 06/16/2009    PCP: Rollene Pesa  REFERRING PROVIDER: Genelle Standing, MD  REFERRING DIAG: M23.90 (ICD-10-CM) - Patellar malalignment syndrome, unspecified laterality  THERAPY DIAG:  Patellar instability of right knee  Difficulty in walking, not elsewhere classified  Knee stiffness, right  Rationale for Evaluation and Treatment: Rehabilitation  ONSET DATE: 06/16/2023  SUBJECTIVE:   SUBJECTIVE STATEMENT: Arrived today without crutch; wearing a different type brace.  She is having some sciatic type pain from mid low back down the back of the leg to just past the knee.    EVAL:S/p Medial Patella Femoral ligament reconstruction per Dr. Genelle 06/16/23; right knee instability; tearing sound with steps especially She jarred her knee a couple times; overstretched the knee and is hearing some cracking but had her immobilizer on both times.   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  Patient arrives in immobilizer with crutches;  states she is  supposed to get a new brace today but PT informs her this is likely to be done at her MD office; patient instructed to call her surgeon's office regarding follow up appointment for brace an to get sutures out.    PERTINENT HISTORY: Have had 2 surgeries on left knee already for instability Pinched nerve in back running down leg PAIN:  Are you having pain? Yes: NPRS scale: 4/10 Pain location: right knee, anterior knee Pain description: burning, constant ache; sometimes sharp pain; spasm  Aggravating factors: jarring  Relieving factors: pain meds, ice  PRECAUTIONS: Knee Right knee  RED FLAGS: None noted  WEIGHT BEARING RESTRICTIONS: Yes heel touch only   FALLS:  Has patient fallen in last 6 months? 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  DIAGNOSTIC FINDINGS:   PATIENT SURVEYS:  FOTO 29  COGNITION: Overall cognitive status: Within functional limits for tasks assessed     SENSATION: WFL  EDEMA:  Normal for this time s/p   POSTURE: rounded shoulders, forward head, and weight shift left  PALPATION: General tenderness right knee   LOWER EXTREMITY ROM:  Active ROM Right eval Left eval Right  07/16/23 Right 07/23/23 Right 08/05/23  Hip flexion       Hip extension       Hip abduction       Hip adduction       Hip internal rotation       Hip external rotation       Knee flexion   60 AAROM 88 112  Knee extension -12 hyperextends -5  0  Ankle dorsiflexion       Ankle  plantarflexion       Ankle inversion       Ankle eversion        (Blank rows = not tested)  LOWER EXTREMITY MMT:  MMT Right eval Left eval  Hip flexion    Hip extension    Hip abduction    Hip adduction    Hip internal rotation    Hip external rotation    Knee flexion    Knee extension Poor quad set   Ankle dorsiflexion    Ankle plantarflexion    Ankle inversion    Ankle eversion     (Blank rows = not tested)  FUNCTIONAL TESTS:  30 seconds chair stand test x 1  GAIT: Distance walked: 60 ft in clinic Assistive device utilized: Crutches Level of assistance: SBA Comments: heel touch weightbearing right leg   TODAY'S TREATMENT:                                                                                                                              DATE:  08/10/23 Standing lumbar extension Supine  Single knee to chest 3 x 20 Quad set 5 hold x 10 SAQ 5 hold x 10 SLR x 15  Sit to stand no UE assist from low mat x 10 Sit to stand holding yellow med ball from low mat x 10 Standing: Heel raises 2 x 10 Slant board 5 x 20 Tandem stance x 30 each SLS 3 x 10 each Mini squats 2 x 10  08/05/23: Supine: Quad set 10x 5 SAQ 10x 5 AROM 0-112 SLR 10x  no brace or AD 370  Standing: Heel raises 15x Toe raises 15x Minisquats 10x 4in step up with BUE A TKE with GTB 10x5  Seated STS eccentric control 10x no HHA  07/23/23 Supine: Quad sets 5 hold x 10 Ankle pumps x 20 Glute sets 5 hold x 10 Heel slides with strap x 10; AAROM 88 degrees today  Left  sidelying: right hip abduction 2 x 10 Clam 2 x 10  Seated: Hamstring stretch 5 x 20 Heel slides for flexion x 10  Gait training with brace opened up to 40 degrees and using 1 crutch  Standing: Heel raises x 10 Slant board 3 x 20  07/16/23  Review of HEP and goals Supine: Quad sets 5 holds x 10 Ankle pumps x 20 Right heel slides with strap x 10 Adjusted right knee  brace Standing: heel raises x 10 Weight shifts x 10    06/30/23 physical therapy evaluation and HEP instruction    PATIENT EDUCATION:  Education details: Patient educated on exam findings, POC, scope of PT, HEP, and what to expect visit. Person educated: Patient Education method: Explanation, Demonstration, and Handouts Education comprehension: verbalized understanding, returned demonstration, verbal cues required, and tactile cues required  HOME EXERCISE PROGRAM: 07/23/23 - Clamshell  - 2 x daily - 7 x weekly - 2 sets - 10 reps - Sidelying Hip Abduction  - 2 x daily - 7 x weekly - 2 sets - 10 reps - Seated Heel Slide  - 2 x daily - 7 x weekly - 2 sets - 10 reps - Seated Hamstring Stretch  - 2 x daily - 7 x weekly - 1 sets - 5 reps - 20 sec hold  Access Code: [email or web address] URL: [email or web address] Date: 06/30/2023 Prepared by: [facility]  Exercises - Supine Quad Set  - 2 x daily - 7 x weekly - 1 sets - 10 reps - 5 sec hold - gentle 4 way patellar glides  - 2 x daily - 7 x weekly - 1 sets - 10 reps - Supine Ankle Pumps  - 2 x daily - 7 x weekly - 2 sets - 10 reps - Seated Ankle Pumps  - 2 x daily - 7 x weekly - 2 sets - 10 reps - Supine Active Straight Leg Raise  - 2 x daily - 7 x weekly - 1 sets - 10 reps Access Code: [email or web address] URL: [email or web address] Date: 07/16/2023 Prepared by: [facility]  Exercises - Supine Quad Set  - 2 x daily - 7 x weekly - 1 sets - 10 reps - 5 sec hold - gentle 4 way patellar glides  - 2 x daily - 7 x weekly - 1 sets - 10 reps - Supine Ankle Pumps  - 2 x daily - 7 x weekly - 2 sets - 10 reps - Seated Ankle Pumps  - 2 x daily - 7 x weekly - 2 sets - 10 reps - Supine Active Straight Leg Raise  - 2 x daily - 7 x weekly - 1 sets - 10 reps - Supine Heel Slide with Strap  - 1 x daily - 7 x weekly - 1 sets - 10 reps - Standing Heel Raise with Support  - 1 x daily - 7 x weekly - 1 sets - 10 reps - Side to Side Weight Shift with  Counter Support  - 1 x daily - 7 x weekly - 1 sets - 10 reps ASSESSMENT:  CLINICAL IMPRESSION: Pt at 8 week post-op tomorrow progressing well towards goals.  Patient with some compliant so sciatica in the evenings so added some lumbar extensions and some knee to the chest to try to address; patient not currently hurting so difficult to differentiate. Continues with slight lag with SLR noted.  Good equal weight bearing with sit to stand from low mat table.  Needs some cues  for form with squats to avoid increased compression on knee.  Able to stand on left leg only x 10 today.  Patient will benefit from continued skilled therapy services to address deficits and promote return to optimal function.         Eval:Patient is a 43 y.o. female who was seen today for physical therapy evaluation and treatment for s/p M23.90 (ICD-10-CM) - Patellar malalignment syndrome, unspecified laterality.  Patient demonstrates muscle weakness, reduced ROM, and fascial restrictions which are likely contributing to symptoms of pain and are negatively impacting patient ability to perform ADLs and functional mobility tasks. Patient will benefit from skilled physical therapy services to address these deficits to reduce pain and improve level of function with ADLs and functional mobility tasks.   OBJECTIVE IMPAIRMENTS: Abnormal gait, decreased activity tolerance, decreased endurance, decreased knowledge of use of DME, decreased mobility, difficulty walking, decreased ROM, decreased strength, increased edema, increased fascial restrictions, impaired perceived functional ability, impaired flexibility, and pain.   ACTIVITY LIMITATIONS: lifting, bending, sitting, standing, squatting, sleeping, stairs, transfers, bed mobility, bathing, toileting, dressing, locomotion level, and caring for others  PARTICIPATION LIMITATIONS: meal prep, cleaning, laundry, driving, shopping, community activity, occupation, and church  PERSONAL  FACTORS:  back pain and left knee pain  are also affecting patient's functional outcome.   REHAB POTENTIAL: Good  CLINICAL DECISION MAKING: Stable/uncomplicated  EVALUATION COMPLEXITY: Low   GOALS: Goals reviewed with patient? No  SHORT TERM GOALS: Target date: 07/28/2023 patient will be independent with initial HEP  Baseline: Goal status: IN progress   2.  Patient will improve right knee mobility to 0 to 45 degrees to promote progression toward normalizing gait pattern Baseline:  Goal status: in progress    LONG TERM GOALS: Target date: 08/18/2023  Patient will improve FOTO score by 10 points to demonstrate improved perceived functional mobility  Baseline: 29 Goal status: in progress  2.  Patient will ambulate without AD and minimal gait deviation household and short community distances to safely grocery shop Baseline:  Goal status: in progress  3.  Patient will increase right knee mobility to 0 to 120 to promote normal navigation of steps; step over step pattern  Baseline: see above Goal status: IN progress  4.  Patient will increase right  leg MMTs to 4+ to 5/5 without pain to promote return to ambulation community distances with minimal deviation. (Goal Date 12 weeks out per protocol)  Baseline: see above Goal status: IN progress  PLAN:  PT FREQUENCY: 2x/week  PT DURATION: 8 weeks  PLANNED INTERVENTIONS: 97164- PT Re-evaluation, 97110-Therapeutic exercises, 97530- Therapeutic activity, 97112- Neuromuscular re-education, 97535- Self Care, [id number]- Manual therapy, [phone]- Gait training, [phone]- Orthotic Fit/training, [phone]- Canalith repositioning, [id number]- Aquatic Therapy, [phone]- Splinting, Patient/Family education, Balance training, Stair training, Taping, Dry Needling, Joint mobilization, Joint manipulation, Spinal manipulation, Spinal mobilization, Scar mobilization, and DME instructions.   PLAN FOR NEXT SESSION: progress per protocol ; patient will be s/p 8 weeks  on 08/11/23.  2:24 PM, 08/10/23 Daveyon Kitchings Small Abiel [place]trim MPT [facility] [place] [phone]

## 2023-08-12 ENCOUNTER — Encounter (HOSPITAL_COMMUNITY): Payer: Medicare Other

## 2023-08-13 DIAGNOSIS — R030 Elevated blood-pressure reading, without diagnosis of hypertension: Secondary | ICD-10-CM | POA: Diagnosis not present

## 2023-08-13 DIAGNOSIS — R7303 Prediabetes: Secondary | ICD-10-CM | POA: Diagnosis not present

## 2023-08-13 DIAGNOSIS — E559 Vitamin D deficiency, unspecified: Secondary | ICD-10-CM | POA: Diagnosis not present

## 2023-08-13 DIAGNOSIS — Z79899 Other long term (current) drug therapy: Secondary | ICD-10-CM | POA: Diagnosis not present

## 2023-08-13 DIAGNOSIS — M25561 Pain in right knee: Secondary | ICD-10-CM | POA: Diagnosis not present

## 2023-08-13 DIAGNOSIS — M542 Cervicalgia: Secondary | ICD-10-CM | POA: Diagnosis not present

## 2023-08-13 DIAGNOSIS — M47812 Spondylosis without myelopathy or radiculopathy, cervical region: Secondary | ICD-10-CM | POA: Diagnosis not present

## 2023-08-13 DIAGNOSIS — M1712 Unilateral primary osteoarthritis, left knee: Secondary | ICD-10-CM | POA: Diagnosis not present

## 2023-08-13 DIAGNOSIS — M25562 Pain in left knee: Secondary | ICD-10-CM | POA: Diagnosis not present

## 2023-08-13 DIAGNOSIS — I10 Essential (primary) hypertension: Secondary | ICD-10-CM | POA: Diagnosis not present

## 2023-08-14 ENCOUNTER — Encounter (HOSPITAL_BASED_OUTPATIENT_CLINIC_OR_DEPARTMENT_OTHER): Payer: Medicare Other | Admitting: Orthopaedic Surgery

## 2023-08-17 ENCOUNTER — Encounter (HOSPITAL_COMMUNITY): Payer: Medicare Other

## 2023-08-19 ENCOUNTER — Encounter (HOSPITAL_COMMUNITY): Payer: Medicare Other

## 2023-08-21 ENCOUNTER — Ambulatory Visit (HOSPITAL_BASED_OUTPATIENT_CLINIC_OR_DEPARTMENT_OTHER): Payer: Medicare Other | Admitting: Orthopaedic Surgery

## 2023-08-21 ENCOUNTER — Ambulatory Visit (HOSPITAL_BASED_OUTPATIENT_CLINIC_OR_DEPARTMENT_OTHER): Payer: Medicare Other

## 2023-08-21 ENCOUNTER — Ambulatory Visit (INDEPENDENT_AMBULATORY_CARE_PROVIDER_SITE_OTHER): Payer: Medicare Other | Admitting: Professional Counselor

## 2023-08-21 DIAGNOSIS — F411 Generalized anxiety disorder: Secondary | ICD-10-CM | POA: Diagnosis not present

## 2023-08-21 DIAGNOSIS — M2391 Unspecified internal derangement of right knee: Secondary | ICD-10-CM | POA: Diagnosis not present

## 2023-08-21 DIAGNOSIS — M2390 Unspecified internal derangement of unspecified knee: Secondary | ICD-10-CM

## 2023-08-21 DIAGNOSIS — M25461 Effusion, right knee: Secondary | ICD-10-CM | POA: Diagnosis not present
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
## 2023-08-21 NOTE — Progress Notes (Signed)
Chief Complaint: Status post right tibial tubercle osteotomy with MPFL reconstruction     History of Present Illness:   08/21/2023: Presents today for follow-up 8 weeks status post the above procedure.  Overall she is doing very well.  She does occasionally have some swelling but has full range of motion and overall feels quite strong.  Martha Montgomery is a 43 y.o. female presents today 3 weeks status post right knee MPFL reconstruction and tibial tubercle osteotomy.  Overall she is doing very well.  She has been able to apply weight fully on the right leg.  She has been working with physical therapy.  Denies any significant pain or swelling in the calf or knee.    Surgical History:   None  PMH/PSH/Family History/Social History/Meds/Allergies:    Past Medical History:  Diagnosis Date   Anxiety    Anxiety disorder    Arthritis    "knees, joints, hands, toes" (09/28/2013)   Depression    Depression, major, single episode, in partial remission (HCC) 10/18/2008   Qualifier: Diagnosis of  By: Lodema Hong MD, Margaret  PHQ 9 score of 7 in 10/2017, not suicidal or homicidal, interested in telepsych however, score of 16 in  02/2018, not suicidal or homicidal, start medication and refer to telepsych Score of 7 in 07/2018    Fibromyalgia    Gastritis nov. 2011   EGD by Dr. Darrick Penna, negative h pylori   GERD (gastroesophageal reflux disease)    Gout    Hypertension x 6 years    Hypothyroidism    Migraines    "15/month average" (09/28/2013)   Obesity (BMI 30.0-34.9)    Spinal stenosis    Thyroid goiter    Past Surgical History:  Procedure Laterality Date   BIOPSY  10/20/2017   Procedure: BIOPSY;  Surgeon: West Bali, MD;  Location: [facility];  Service: Endoscopy;;  gastric duodenal esophagus   CESAREAN SECTION  2008   CHONDROPLASTY  04/02/2012   Procedure: CHONDROPLASTY;  Surgeon: Vickki Hearing, MD;  Location: [facility];  Service: Orthopedics;   Laterality: Left;  of left patella   COLONOSCOPY  11/2010   Dr. Darrick Penna: normal colon, small internal hemorrhoids   COLONOSCOPY WITH PROPOFOL N/A 10/22/2017   Surgeon: West Bali, MD; significant looping of the colon, internal hemorrhoids, otherwise normal exam.  Recommended 10-year surveillance.   COLONOSCOPY WITH PROPOFOL N/A 10/20/2017   Procedure: COLONOSCOPY WITH PROPOFOL;  Surgeon: West Bali, MD;  Location: [facility];  Service: Endoscopy;  Laterality: N/A;  11:00am   ESOPHAGOGASTRODUODENOSCOPY (EGD) WITH PROPOFOL N/A 10/20/2017   Surgeon: West Bali, MD; no endoscopic abnormality to explain dysphagia s/p empiric dilation, gastritis.  Esophageal biopsies consistent with reflux, gastric biopsies with gastric antral and oxyntic mucosa negative for H. pylori, duodenal biopsies benign.   INCISION AND DRAINAGE ABSCESS  1997; 2005   "under right buttocks; under left arm"   KNEE ARTHROSCOPY Left 2021   KNEE ARTHROSCOPY WITH MEDIAL PATELLAR FEMORAL LIGAMENT RECONSTRUCTION Right 06/16/2023   Procedure: RIGHT KNEE ARTHROSCOPY WITH MEDIAL PATELLAR FEMORAL LIGAMENT RECONSTRUCTION;  Surgeon: Huel Cote, MD;  Location: [facility];  Service: Orthopedics;  Laterality: Right;   SAVORY DILATION N/A 10/20/2017   Procedure: SAVORY DILATION;  Surgeon: West Bali, MD;  Location: [facility];  Service: Endoscopy;  Laterality: N/A;   THYROIDECTOMY N/A 09/28/2013   Procedure: TOTAL THYROIDECTOMY;  Surgeon: Darletta Moll, MD;  Location: [facility];  Service: ENT;  Laterality: N/A;   TIBIA OSTEOTOMY Right 06/16/2023   Procedure: RIGHT KNEE TIBIAL TUBERLE OSTEOTOMY;  Surgeon: Huel Cote, MD;  Location: [facility];  Service: Orthopedics;  Laterality: Right;   UPPER GI ENDOSCOPY  2011   EGD: gastritis, no h.pylori   Social History   Socioeconomic History   Marital status: Legally Separated    Spouse name: Not on file   Number of children: 1   Years of education: 12   Highest education level:  12th grade  Occupational History   Occupation: Banker of Human resources officer: UNEMPLOYED  Tobacco Use   Smoking status: Former    Current packs/day: 0.00    Average packs/day: 1 pack/day for 5.0 years (5.0 ttl pk-yrs)    Types: Cigarettes    Start date: 09/11/2005    Quit date: 09/11/2010    Years since quitting: 12.9   Smokeless tobacco: Never   Tobacco comments:    QUIT SMOKING X 3 YEARS AGO,was vaping but has since quit completely  Vaping Use   Vaping status: Former   Quit date: 04/28/2023  Substance and Sexual Activity   Alcohol use: No   Drug use: No   Sexual activity: Not Currently    Partners: Male    Birth control/protection: Other-see comments, Injection    Comment: Depo  Other Topics Concern   Not on file  Social History Narrative   Pt is currently on medicaid. From [place] and came to [facility] in mid 2010   Is separated from Husband, lives with Mother right now    Social Drivers of Health   Financial Resource Strain: Medium Risk (06/22/2017)   Overall Financial Resource Strain (CARDIA)    Difficulty of Paying Living Expenses: Somewhat hard  Food Insecurity: Food Insecurity Present (08/30/2020)   Hunger Vital Sign    Worried About Running Out of Food in the Last Year: Sometimes true    Ran Out of Food in the Last Year: Sometimes true  Transportation Needs: No Transportation Needs (08/30/2020)   PRAPARE - Administrator, Civil Service (Medical): No    Lack of Transportation (Non-Medical): No  Physical Activity: Inactive (07/14/2023)   Exercise Vital Sign    Days of Exercise per Week: 0 days    Minutes of Exercise per Session: 0 min  Stress: Stress Concern Present (07/14/2023)   [employer] of Occupational Health - Occupational Stress Questionnaire    Feeling of Stress : To some extent  Social Connections: Unknown (08/20/2022)   [facility]   Social Network    Social Network: Not on file    Family History  Problem Relation Age of Onset   Arthritis Mother    Migraines Mother    Hypertension Mother    Cancer Maternal Grandmother        lung   Asthma Other        family history    Colon cancer Neg Hx    Allergies  Allergen Reactions   Aspirin     Other reaction(s): Bleeding (intolerance) Rectal bleeding   Codeine Itching   Diclofenac     gastritis   Ibuprofen Other (See Comments)    Stomach pain   Metronidazole     Patient cannot remember   Nsaids    Soybean-Containing Drug Products Other (  See Comments)    Tongue swells a little and feels "rug burned".   Current Outpatient Medications  Medication Sig Dispense Refill   aspirin EC 325 MG tablet Take 1 tablet (325 mg total) by mouth daily. 30 tablet 0   busPIRone (BUSPAR) 5 MG tablet Take 1 tablet (5 mg total) by mouth 3 (three) times daily. 90 tablet 2   carboxymethylcellulose (REFRESH PLUS) 0.5 % SOLN Place 1 drop into both eyes 3 (three) times daily as needed (dry eyes).     Cyanocobalamin (B-12 PO) Take 1 tablet by mouth daily.     diphenhydrAMINE (BENADRYL) 25 MG tablet Take 25 mg by mouth daily.     EPINEPHrine 0.3 mg/0.3 mL IJ SOAJ injection Inject 0.3 mg into the muscle as needed for anaphylaxis. 1 each 0   escitalopram (LEXAPRO) 20 MG tablet Take 1 tablet (20 mg total) by mouth daily. 30 tablet 2   fluticasone (FLONASE) 50 MCG/ACT nasal spray Place 2 sprays into both nostrils daily. (Patient taking differently: Place 2 sprays into both nostrils daily as needed for allergies.) 15.8 mL 1   Ginger, Zingiber officinalis, (GINGER PO) Take 1 capsule by mouth daily.     levothyroxine (SYNTHROID) 200 MCG tablet TAKE 1 TABLET(200 MCG) BY MOUTH AT BEDTIME 90 tablet 1   medroxyPROGESTERone (DEPO-PROVERA) 150 MG/ML injection Inject 150 mg into the muscle every 3 (three) months.     morphine (MSIR) 15 MG tablet Take 15 mg by mouth 3 (three) times daily as needed.     Olmesartan-amLODIPine-HCTZ 40-10-25 MG TABS Take 1  tablet by mouth daily.     oxyCODONE (ROXICODONE) 5 MG immediate release tablet Take 1 tablet (5 mg total) by mouth every 4 (four) hours as needed. 20 tablet 0   pantoprazole (PROTONIX) 40 MG tablet Take 1 tablet (40 mg total) by mouth 2 (two) times daily before a meal. (Patient taking differently: Take 40 mg by mouth daily.) 180 tablet 3   pregabalin (LYRICA) 150 MG capsule Take 150 mg by mouth 3 (three) times daily.     rosuvastatin (CRESTOR) 20 MG tablet TAKE 1 TABLET(20 MG) BY MOUTH DAILY 30 tablet 5   traMADol (ULTRAM) 50 MG tablet Take 2 tablets (100 mg total) by mouth daily. 30 tablet 0   Turmeric (QC TUMERIC COMPLEX PO) Take 1 capsule by mouth daily.     Vitamin D, Ergocalciferol, (DRISDOL) 1.25 MG (50000 UNIT) CAPS capsule Take 50,000 Units by mouth once a week.     No current facility-administered medications for this visit.   No results found.  Review of Systems:   A ROS was performed including pertinent positives and negatives as documented in the HPI.  Physical Exam :   Constitutional: NAD and appears stated age Neurological: Alert and oriented Psych: Appropriate affect and cooperative There were no vitals taken for this visit.   Comprehensive Musculoskeletal Exam:    Right knee incisions are well-appearing without erythema or drainage.  No joint line tenderness.  Negative laxity with lateral patellar stress.  Imaging:   Xray (4 views right knee, 4 views left knee): Status post right tibial tubercle osteotomy without evidence of complication, there is some early callus formation    I personally reviewed and interpreted the radiographs.   Assessment:   43 y.o. female 8-week status post right knee tibial tubercle osteotomy and MPFL reconstruction overall doing very well.  At this time she will continue to work on strengthening of the right leg.  She does not  have any residual instability.  I will plan to see her back in 8 weeks for reassessment Plan :    -Return to  clinic in 8 weeks for reassessment     I personally saw and evaluated the patient, and participated in the management and treatment plan.  Huel Cote, MD Attending Physician, Orthopedic Surgery  This document was dictated using Dragon voice recognition software. A reasonable attempt at proof reading has been made to minimize errors.

## 2023-08-24 ENCOUNTER — Ambulatory Visit (HOSPITAL_COMMUNITY): Payer: Medicare Other

## 2023-08-24 DIAGNOSIS — R262 Difficulty in walking, not elsewhere classified: Secondary | ICD-10-CM | POA: Diagnosis not present

## 2023-08-24 DIAGNOSIS — M25661 Stiffness of right knee, not elsewhere classified: Secondary | ICD-10-CM | POA: Diagnosis not present

## 2023-08-24 DIAGNOSIS — M25361 Other instability, right knee: Secondary | ICD-10-CM | POA: Diagnosis not present

## 2023-08-24 NOTE — Therapy (Signed)
OUTPATIENT PHYSICAL THERAPY LOWER EXTREMITY TREATMENT   Patient Name: Martha Montgomery MRN: [id number] DOB:February 14, 1981, 43 y.o., female Today's Date: 08/24/2023  END OF SESSION:  PT End of Session - 08/24/23 1349     Visit Number 6    Number of Visits 16    Date for PT Re-Evaluation 08/18/23    Authorization Type [employer] Medicare    PT Start Time 1348    PT Stop Time 1428    PT Time Calculation (min) 40 min    Activity Tolerance Patient tolerated treatment well    Behavior During Therapy WFL for tasks assessed/performed              Past Medical History:  Diagnosis Date   Anxiety    Anxiety disorder    Arthritis    "knees, joints, hands, toes" (09/28/2013)   Depression    Depression, major, single episode, in partial remission (HCC) 10/18/2008   Qualifier: Diagnosis of  By: Lodema Hong MD, Margaret  PHQ 9 score of 7 in 10/2017, not suicidal or homicidal, interested in telepsych however, score of 16 in  02/2018, not suicidal or homicidal, start medication and refer to telepsych Score of 7 in 07/2018    Fibromyalgia    Gastritis nov. 2011   EGD by Dr. Darrick Penna, negative h pylori   GERD (gastroesophageal reflux disease)    Gout    Hypertension x 6 years    Hypothyroidism    Migraines    "15/month average" (09/28/2013)   Obesity (BMI 30.0-34.9)    Spinal stenosis    Thyroid goiter    Past Surgical History:  Procedure Laterality Date   BIOPSY  10/20/2017   Procedure: BIOPSY;  Surgeon: West Bali, MD;  Location: [facility];  Service: Endoscopy;;  gastric duodenal esophagus   CESAREAN SECTION  2008   CHONDROPLASTY  04/02/2012   Procedure: CHONDROPLASTY;  Surgeon: Vickki Hearing, MD;  Location: [facility];  Service: Orthopedics;  Laterality: Left;  of left patella   COLONOSCOPY  11/2010   Dr. Darrick Penna: normal colon, small internal hemorrhoids   COLONOSCOPY WITH PROPOFOL N/A 10/22/2017   Surgeon: West Bali, MD; significant looping of the colon, internal hemorrhoids,  otherwise normal exam.  Recommended 10-year surveillance.   COLONOSCOPY WITH PROPOFOL N/A 10/20/2017   Procedure: COLONOSCOPY WITH PROPOFOL;  Surgeon: West Bali, MD;  Location: [facility];  Service: Endoscopy;  Laterality: N/A;  11:00am   ESOPHAGOGASTRODUODENOSCOPY (EGD) WITH PROPOFOL N/A 10/20/2017   Surgeon: West Bali, MD; no endoscopic abnormality to explain dysphagia s/p empiric dilation, gastritis.  Esophageal biopsies consistent with reflux, gastric biopsies with gastric antral and oxyntic mucosa negative for H. pylori, duodenal biopsies benign.   INCISION AND DRAINAGE ABSCESS  1997; 2005   "under right buttocks; under left arm"   KNEE ARTHROSCOPY Left 2021   KNEE ARTHROSCOPY WITH MEDIAL PATELLAR FEMORAL LIGAMENT RECONSTRUCTION Right 06/16/2023   Procedure: RIGHT KNEE ARTHROSCOPY WITH MEDIAL PATELLAR FEMORAL LIGAMENT RECONSTRUCTION;  Surgeon: Huel Cote, MD;  Location: [facility];  Service: Orthopedics;  Laterality: Right;   SAVORY DILATION N/A 10/20/2017   Procedure: SAVORY DILATION;  Surgeon: West Bali, MD;  Location: [facility];  Service: Endoscopy;  Laterality: N/A;   THYROIDECTOMY N/A 09/28/2013   Procedure: TOTAL THYROIDECTOMY;  Surgeon: Darletta Moll, MD;  Location: [facility] OR;  Service: ENT;  Laterality: N/A;   TIBIA OSTEOTOMY Right 06/16/2023   Procedure: RIGHT KNEE TIBIAL TUBERLE OSTEOTOMY;  Surgeon: Huel Cote, MD;  Location: [facility];  Service: Orthopedics;  Laterality: Right;   UPPER GI ENDOSCOPY  2011   EGD: gastritis, no h.pylori   Patient Active Problem List   Diagnosis Date Noted   Patellar instability of both knees 06/16/2023   GAD (generalized anxiety disorder) 04/20/2023   Mild depression 04/20/2023   Depression, major, single episode, mild (HCC) 04/20/2023   Decreased vision 04/20/2023   Bladder distension 04/20/2023   Encounter for surveillance of injectable contraceptive 11/07/2022   Encounter for Medicare annual examination with abnormal  findings 08/07/2022   Hyperlipidemia 06/08/2022   Worm infestation 05/12/2022   Generalized joint pain 03/26/2022   Twitching 09/03/2021   Toxic effect of kerosene 09/03/2021   Hypertension    Finger pain, right 07/31/2020   Bilateral groin pain 07/31/2020   Dental caries 04/24/2019   Morbid obesity due to excess calories (HCC) 01/08/2019   Multiple benign lumps of breast 08/17/2018   Hemorrhoids 05/19/2018   Encounter for contraceptive management 03/20/2018   Low vitamin D level 11/30/2017   Bloating 03/05/2017   Bilateral knee pain 01/05/2017   Amenorrhea 01/05/2017   Bilateral hip pain 07/22/2016   Post-surgical hypothyroidism 06/02/2014   Knee pain 06/02/2012   Patellar malalignment syndrome 03/09/2012   IBS (irritable bowel syndrome) 08/29/2011   GERD (gastroesophageal reflux disease) 08/29/2011   Back pain 09/24/2010   Migraine headache 04/02/2010   Constipation 09/12/2009   Allergic rhinitis 06/16/2009    PCP: Syliva Overman  REFERRING PROVIDER: Huel Cote, MD  REFERRING DIAG: M23.90 (ICD-10-CM) - Patellar malalignment syndrome, unspecified laterality  THERAPY DIAG:  Patellar instability of right knee  Difficulty in walking, not elsewhere classified  Knee stiffness, right  Rationale for Evaluation and Treatment: Rehabilitation  ONSET DATE: 06/16/2023  SUBJECTIVE:   SUBJECTIVE STATEMENT:  patient was s/p 8 weeks on 08/11/23.  Arrives without brace or AD.  Had a good appt/follow up with surgeon.  EVAL:S/p Medial Patella Femoral ligament reconstruction per Dr. Steward Drone 06/16/23; right knee instability; "tearing sound" with steps especially She jarred her knee a couple times; "overstretched the knee" and is hearing some "cracking" but had her immobilizer on both times.   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  Patient arrives in immobilizer with crutches;  states she is "supposed to get a new brace today" but PT informs her this  is likely to be done at her MD office; patient instructed to call her surgeon's office regarding follow up appointment for brace an to get sutures out.    PERTINENT HISTORY: Have had 2 surgeries on left knee already for instability Pinched nerve in back running down leg PAIN:  Are you having pain? Yes: NPRS scale: 4/10 Pain location: right knee, anterior knee Pain description: burning, constant ache; sometimes sharp pain; spasm  Aggravating factors: jarring  Relieving factors: pain meds, ice  PRECAUTIONS: Knee Right knee  RED FLAGS: None noted  WEIGHT BEARING RESTRICTIONS: Yes heel touch only   FALLS:  Has patient fallen in last 6 months? 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  DIAGNOSTIC FINDINGS:   PATIENT SURVEYS:  FOTO 29  COGNITION: Overall cognitive status: Within functional limits for tasks assessed     SENSATION: WFL  EDEMA:  Normal for this time s/p   POSTURE: rounded shoulders, forward head, and weight shift left  PALPATION: General tenderness right knee   LOWER EXTREMITY ROM:  Active ROM Right eval Left eval Right  07/16/23 Right 07/23/23 Right 08/05/23  Hip flexion       Hip extension       Hip abduction       Hip adduction       Hip internal rotation       Hip external rotation       Knee flexion   60 AAROM 88 112  Knee extension -12 hyperextends -5  0  Ankle dorsiflexion       Ankle plantarflexion       Ankle inversion       Ankle eversion         (Blank rows = not tested)  LOWER EXTREMITY MMT:  MMT Right eval Left eval  Hip flexion    Hip extension    Hip abduction    Hip adduction    Hip internal rotation    Hip external rotation    Knee flexion    Knee extension Poor quad set   Ankle dorsiflexion    Ankle plantarflexion    Ankle inversion    Ankle eversion     (Blank rows = not tested)  FUNCTIONAL TESTS:  30 seconds chair stand test x 1  GAIT: Distance walked: 60 ft in clinic Assistive device utilized: Crutches Level of assistance: SBA Comments: heel touch weightbearing right leg   TODAY'S TREATMENT:                                                                                                                              DATE:  08/24/23 Nustep seat 8 x 5' dynamic warm up Standing: Heel raises 2 x 10 Slant board 5 x 20" Lumbar extension x 10" x 10 Sit to stand from chair no UE assist x 10 Sit to stand from chair with yellow med ball x 10 Tandem stance 2 x 30" each 4" step ups x 10   08/10/23 Standing lumbar extension Supine  Single knee to chest 3 x 20" Quad set 5" hold x 10 SAQ 5" hold x 10 SLR x 15  Sit to stand no UE assist from low mat x 10 Sit to stand holding yellow med ball from low mat x 10 Standing: Heel raises 2 x 10 Slant board 5 x 20" Tandem stance x 30" each SLS 3 x 10" each Mini squats 2 x 10  08/05/23: Supine: Quad set 10x 5" SAQ 10x 5" AROM 0-112 SLR 10x  no brace or AD 370  Standing: Heel raises 15x Toe raises 15x Minisquats 10x 4in  step up with BUE A TKE with GTB 10x5"  Seated STS eccentric control 10x no HHA  07/23/23 Supine: Quad sets 5" hold x 10 Ankle pumps x 20 Glute sets 5" hold x 10 Heel slides with strap x 10; AAROM 88 degrees today  Left sidelying: right hip abduction 2 x 10 Clam 2 x 10  Seated: Hamstring stretch 5 x 20" Heel slides for flexion x 10  Gait training with brace opened up to 40 degrees and using 1  crutch  Standing: Heel raises x 10 Slant board 3 x 20"  07/16/23  Review of HEP and goals Supine: Quad sets 5" holds x 10 Ankle pumps x 20 Right heel slides with strap x 10 Adjusted right knee brace Standing: heel raises x 10 Weight shifts x 10    06/30/23 physical therapy evaluation and HEP instruction    PATIENT EDUCATION:  Education details: Patient educated on exam findings, POC, scope of PT, HEP, and what to expect visit. Person educated: Patient Education method: Explanation, Demonstration, and Handouts Education comprehension: verbalized understanding, returned demonstration, verbal cues required, and tactile cues required  HOME EXERCISE PROGRAM: 07/23/23 - Clamshell  - 2 x daily - 7 x weekly - 2 sets - 10 reps - Sidelying Hip Abduction  - 2 x daily - 7 x weekly - 2 sets - 10 reps - Seated Heel Slide  - 2 x daily - 7 x weekly - 2 sets - 10 reps - Seated Hamstring Stretch  - 2 x daily - 7 x weekly - 1 sets - 5 reps - 20 sec hold  Access Code: [email or web address] URL: [email or web address] Date: 06/30/2023 Prepared by: [facility]  Exercises - Supine Quad Set  - 2 x daily - 7 x weekly - 1 sets - 10 reps - 5 sec hold - gentle 4 way patellar glides  - 2 x daily - 7 x weekly - 1 sets - 10 reps - Supine Ankle Pumps  - 2 x daily - 7 x weekly - 2 sets - 10 reps - Seated Ankle Pumps  - 2 x daily - 7 x weekly - 2 sets - 10 reps - Supine Active Straight Leg Raise  - 2 x daily - 7 x weekly - 1 sets - 10 reps Access Code: [email or web address] URL: [email or web address] Date: 07/16/2023 Prepared by: [facility]  Exercises - Supine Quad Set  - 2 x daily - 7 x weekly - 1 sets - 10 reps - 5 sec hold - gentle 4 way patellar glides  - 2 x daily - 7 x weekly - 1 sets - 10 reps - Supine Ankle Pumps  - 2 x daily - 7 x weekly - 2 sets - 10 reps - Seated Ankle Pumps  - 2 x daily - 7 x weekly - 2 sets - 10 reps - Supine Active Straight Leg Raise  - 2 x daily - 7 x weekly - 1  sets - 10 reps - Supine Heel Slide with Strap  - 1 x daily - 7 x weekly - 1 sets - 10 reps - Standing Heel Raise with Support  - 1 x daily - 7 x weekly - 1 sets - 10 reps - Side to Side Weight Shift with Counter Support  - 1 x daily - 7 x weekly - 1 sets - 10 reps ASSESSMENT:  CLINICAL IMPRESSION: Pt at 10 weeks post-op.  Added Nustep today for mobility; has some back pain during Nustep so adjusted  to improve her posture and this decreased her pain.  She then had some posterior lateral knee pain during treatment but question possibly this is related to her lumbar spine. Has to use her hands to assist with maintaining balance in tandem stance in bars. Knee and back pain resolved after treatment  Patient will benefit from continued skilled therapy services to address deficits and promote return to optimal function.         Eval:Patient is a 43 y.o. female who was seen today for physical therapy evaluation and treatment for s/p M23.90 (ICD-10-CM) - Patellar malalignment syndrome, unspecified laterality.  Patient demonstrates muscle weakness, reduced ROM, and fascial restrictions which are likely contributing to symptoms of pain and are negatively impacting patient ability to perform ADLs and functional mobility tasks. Patient will benefit from skilled physical therapy services to address these deficits to reduce pain and improve level of function with ADLs and functional mobility tasks.   OBJECTIVE IMPAIRMENTS: Abnormal gait, decreased activity tolerance, decreased endurance, decreased knowledge of use of DME, decreased mobility, difficulty walking, decreased ROM, decreased strength, increased edema, increased fascial restrictions, impaired perceived functional ability, impaired flexibility, and pain.   ACTIVITY LIMITATIONS: lifting, bending, sitting, standing, squatting, sleeping, stairs, transfers, bed mobility, bathing, toileting, dressing, locomotion level, and caring for others  PARTICIPATION  LIMITATIONS: meal prep, cleaning, laundry, driving, shopping, community activity, occupation, and church  PERSONAL FACTORS:  back pain and left knee pain  are also affecting patient's functional outcome.   REHAB POTENTIAL: Good  CLINICAL DECISION MAKING: Stable/uncomplicated  EVALUATION COMPLEXITY: Low   GOALS: Goals reviewed with patient? No  SHORT TERM GOALS: Target date: 07/28/2023 patient will be independent with initial HEP  Baseline: Goal status: IN progress   2.  Patient will improve right knee mobility to 0 to 45 degrees to promote progression toward normalizing gait pattern Baseline:  Goal status: in progress    LONG TERM GOALS: Target date: 08/18/2023  Patient will improve FOTO score by 10 points to demonstrate improved perceived functional mobility  Baseline: 29 Goal status: in progress  2.  Patient will ambulate without AD and minimal gait deviation household and short community distances to safely grocery shop Baseline:  Goal status: in progress  3.  Patient will increase right knee mobility to 0 to 120 to promote normal navigation of steps; step over step pattern  Baseline: see above Goal status: IN progress  4.  Patient will increase right  leg MMTs to 4+ to 5/5 without pain to promote return to ambulation community distances with minimal deviation. (Goal Date 12 weeks out per protocol)  Baseline: see above Goal status: IN progress  PLAN:  PT FREQUENCY: 2x/week  PT DURATION: 8 weeks  PLANNED INTERVENTIONS: 97164- PT Re-evaluation, 97110-Therapeutic exercises, 97530- Therapeutic activity, 97112- Neuromuscular re-education, 97535- Self Care, 16109- Manual therapy, [phone]- Gait training, [phone]- Orthotic Fit/training, [phone]- Canalith repositioning, [id number]- Aquatic Therapy, [phone]- Splinting, Patient/Family education, Balance training, Stair training, Taping, Dry Needling, Joint mobilization, Joint manipulation, Spinal manipulation, Spinal mobilization, Scar  mobilization, and DME instructions.   PLAN FOR NEXT SESSION: progress per protocol ; patient s/p 8 weeks on 08/11/23.  2:31 PM, 08/24/23 Montgomery Rothlisberger Small Zedric Deroy MPT [facility] [place] [phone]

## 2023-08-25 NOTE — Patient Instructions (Signed)
If your symptoms worsen or you have thoughts of suicide/homicide, PLEASE SEEK IMMEDIATE MEDICAL ATTENTION.  You may always call:   National Suicide Hotline: 988 or 917-531-6113 [place] Crisis Line: 636-643-9342 Crisis Recovery in [place]: [phone]     These are available 24 hours a day, 7 days a week.

## 2023-08-26 ENCOUNTER — Encounter (HOSPITAL_COMMUNITY): Payer: Medicare Other

## 2023-08-31 ENCOUNTER — Encounter (HOSPITAL_COMMUNITY): Payer: Medicare Other

## 2023-08-31 NOTE — Addendum Note (Signed)
Addended byWilhemena Durie S on: 08/31/2023 07:14 AM   Modules accepted: Orders

## 2023-09-02 ENCOUNTER — Ambulatory Visit (HOSPITAL_COMMUNITY): Payer: Medicare Other | Attending: Orthopaedic Surgery

## 2023-09-02 DIAGNOSIS — R262 Difficulty in walking, not elsewhere classified: Secondary | ICD-10-CM | POA: Diagnosis not present

## 2023-09-02 DIAGNOSIS — M25361 Other instability, right knee: Secondary | ICD-10-CM | POA: Diagnosis not present

## 2023-09-02 DIAGNOSIS — M25661 Stiffness of right knee, not elsewhere classified: Secondary | ICD-10-CM | POA: Insufficient documentation

## 2023-09-02 NOTE — Therapy (Signed)
 OUTPATIENT PHYSICAL THERAPY LOWER EXTREMITY TREATMENT   Patient Name: Martha Montgomery MRN: [id number] DOB:July 21, 1981, 43 y.o., female Today's Date: 09/02/2023  END OF SESSION:  PT End of Session - 09/02/23 1351     Visit Number 7    Number of Visits 16    Date for PT Re-Evaluation 09/19/23    Authorization Type [employer] Medicare    PT Start Time 1350    PT Stop Time 1430    PT Time Calculation (min) 40 min    Activity Tolerance Patient tolerated treatment well    Behavior During Therapy WFL for tasks assessed/performed              Past Medical History:  Diagnosis Date   Anxiety    Anxiety disorder    Arthritis    knees, joints, hands, toes (09/28/2013)   Depression    Depression, major, single episode, in partial remission (HCC) 10/18/2008   Qualifier: Diagnosis of  By: Antonetta MD, Margaret  PHQ 9 score of 7 in 10/2017, not suicidal or homicidal, interested in telepsych however, score of 16 in  02/2018, not suicidal or homicidal, start medication and refer to telepsych Score of 7 in 07/2018    Fibromyalgia    Gastritis nov. 2011   EGD by Dr. Harvey, negative h pylori   GERD (gastroesophageal reflux disease)    Gout    Hypertension x 6 years    Hypothyroidism    Migraines    15/month average (09/28/2013)   Obesity (BMI 30.0-34.9)    Spinal stenosis    Thyroid  goiter    Past Surgical History:  Procedure Laterality Date   BIOPSY  10/20/2017   Procedure: BIOPSY;  Surgeon: Harvey Margo CROME, MD;  Location: [facility];  Service: Endoscopy;;  gastric duodenal esophagus   CESAREAN SECTION  2008   CHONDROPLASTY  04/02/2012   Procedure: CHONDROPLASTY;  Surgeon: Taft FORBES Minerva, MD;  Location: [facility];  Service: Orthopedics;  Laterality: Left;  of left patella   COLONOSCOPY  11/2010   Dr. Harvey: normal colon, small internal hemorrhoids   COLONOSCOPY WITH PROPOFOL  N/A 10/22/2017   Surgeon: Harvey Margo CROME, MD; significant looping of the colon, internal hemorrhoids,  otherwise normal exam.  Recommended 10-year surveillance.   COLONOSCOPY WITH PROPOFOL  N/A 10/20/2017   Procedure: COLONOSCOPY WITH PROPOFOL ;  Surgeon: Harvey Margo CROME, MD;  Location: [facility];  Service: Endoscopy;  Laterality: N/A;  11:00am   ESOPHAGOGASTRODUODENOSCOPY (EGD) WITH PROPOFOL  N/A 10/20/2017   Surgeon: Harvey Margo CROME, MD; no endoscopic abnormality to explain dysphagia s/p empiric dilation, gastritis.  Esophageal biopsies consistent with reflux, gastric biopsies with gastric antral and oxyntic mucosa negative for H. pylori, duodenal biopsies benign.   INCISION AND DRAINAGE ABSCESS  1997; 2005   under right buttocks; under left arm   KNEE ARTHROSCOPY Left 2021   KNEE ARTHROSCOPY WITH MEDIAL PATELLAR FEMORAL LIGAMENT RECONSTRUCTION Right 06/16/2023   Procedure: RIGHT KNEE ARTHROSCOPY WITH MEDIAL PATELLAR FEMORAL LIGAMENT RECONSTRUCTION;  Surgeon: Genelle Standing, MD;  Location: [facility];  Service: Orthopedics;  Laterality: Right;   SAVORY DILATION N/A 10/20/2017   Procedure: SAVORY DILATION;  Surgeon: Harvey Margo CROME, MD;  Location: [facility];  Service: Endoscopy;  Laterality: N/A;   THYROIDECTOMY N/A 09/28/2013   Procedure: TOTAL THYROIDECTOMY;  Surgeon: Ana LELON Moccasin, MD;  Location: [facility] OR;  Service: ENT;  Laterality: N/A;   TIBIA OSTEOTOMY Right 06/16/2023   Procedure: RIGHT KNEE TIBIAL TUBERLE OSTEOTOMY;  Surgeon: Genelle Standing, MD;  Location: [facility];  Service: Orthopedics;  Laterality: Right;   UPPER GI ENDOSCOPY  2011   EGD: gastritis, no h.pylori   Patient Active Problem List   Diagnosis Date Noted   Patellar instability of both knees 06/16/2023   GAD (generalized anxiety disorder) 04/20/2023   Mild depression 04/20/2023   Depression, major, single episode, mild (HCC) 04/20/2023   Decreased vision 04/20/2023   Bladder distension 04/20/2023   Encounter for surveillance of injectable contraceptive 11/07/2022   Encounter for Medicare annual examination with abnormal  findings 08/07/2022   Hyperlipidemia 06/08/2022   Worm infestation 05/12/2022   Generalized joint pain 03/26/2022   Twitching 09/03/2021   Toxic effect of kerosene 09/03/2021   Hypertension    Finger pain, right 07/31/2020   Bilateral groin pain 07/31/2020   Dental caries 04/24/2019   Morbid obesity due to excess calories (HCC) 01/08/2019   Multiple benign lumps of breast 08/17/2018   Hemorrhoids 05/19/2018   Encounter for contraceptive management 03/20/2018   Low vitamin D  level 11/30/2017   Bloating 03/05/2017   Bilateral knee pain 01/05/2017   Amenorrhea 01/05/2017   Bilateral hip pain 07/22/2016   Post-surgical hypothyroidism 06/02/2014   Knee pain 06/02/2012   Patellar malalignment syndrome 03/09/2012   IBS (irritable bowel syndrome) 08/29/2011   GERD (gastroesophageal reflux disease) 08/29/2011   Back pain 09/24/2010   Migraine headache 04/02/2010   Constipation 09/12/2009   Allergic rhinitis 06/16/2009    PCP: Rollene Pesa  REFERRING PROVIDER: Genelle Standing, MD  REFERRING DIAG: M23.90 (ICD-10-CM) - Patellar malalignment syndrome, unspecified laterality  THERAPY DIAG:  Patellar instability of right knee  Difficulty in walking, not elsewhere classified  Knee stiffness, right  Rationale for Evaluation and Treatment: Rehabilitation  ONSET DATE: 06/16/2023  SUBJECTIVE:   SUBJECTIVE STATEMENT: Patient reports she is feeling good; 3/10 pain in the knee today.  Still biggest challenge with step navigation  EVAL:S/p Medial Patella Femoral ligament reconstruction per Dr. Genelle 06/16/23; right knee instability; tearing sound with steps especially She jarred her knee a couple times; overstretched the knee and is hearing some cracking but had her immobilizer on both times.   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  Patient arrives in immobilizer with crutches;  states she is supposed to get a new brace today but PT informs her  this is likely to be done at her MD office; patient instructed to call her surgeon's office regarding follow up appointment for brace an to get sutures out.    PERTINENT HISTORY: Have had 2 surgeries on left knee already for instability Pinched nerve in back running down leg PAIN:  Are you having pain? Yes: NPRS scale: 4/10 Pain location: right knee, anterior knee Pain description: burning, constant ache; sometimes sharp pain; spasm  Aggravating factors: jarring  Relieving factors: pain meds, ice  PRECAUTIONS: Knee Right knee  RED FLAGS: None noted  WEIGHT BEARING RESTRICTIONS: Yes heel touch only   FALLS:  Has patient fallen in last 6 months? 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  DIAGNOSTIC FINDINGS:   PATIENT SURVEYS:  FOTO 29  COGNITION: Overall cognitive status: Within functional limits for tasks assessed     SENSATION: WFL  EDEMA:  Normal for this time s/p   POSTURE: rounded shoulders, forward head, and weight shift left  PALPATION: General tenderness right knee   LOWER EXTREMITY ROM:  Active ROM Right eval Left eval Right  07/16/23 Right 07/23/23 Right 08/05/23  Hip flexion       Hip extension       Hip abduction       Hip adduction       Hip internal rotation       Hip external rotation       Knee flexion   60 AAROM 88 112  Knee extension -12 hyperextends -5  0  Ankle dorsiflexion       Ankle plantarflexion       Ankle inversion       Ankle  eversion        (Blank rows = not tested)  LOWER EXTREMITY MMT:  MMT Right eval Left eval  Hip flexion    Hip extension    Hip abduction    Hip adduction    Hip internal rotation    Hip external rotation    Knee flexion    Knee extension Poor quad set   Ankle dorsiflexion    Ankle plantarflexion    Ankle inversion    Ankle eversion     (Blank rows = not tested)  FUNCTIONAL TESTS:  30 seconds chair stand test x 1  GAIT: Distance walked: 60 ft in clinic Assistive device utilized: Crutches Level of assistance: SBA Comments: heel touch weightbearing right leg   TODAY'S TREATMENT:                                                                                                                              DATE:  09/02/23 Nustep seat 8 x 5' dynamic warm up level 2 Cybex Hamstring curls 3 plates 2 x 10 Heel raises 2 x 10 on step  Calf stretch 5 x 20 against wall Hip vectors 2 hold 2 x 8 each Foam beam tandem stance 3 x 20 each way 6 step ups 2 x 10 Lumbar extensions over bar x 10   08/24/23 Nustep seat 8 x 5' dynamic warm up Standing: Heel raises 2 x 10 Slant board 5 x 20 Lumbar extension x 10 x 10 Sit to stand from chair no UE assist x 10 Sit to stand from chair with yellow med ball x 10 Tandem stance 2 x 30 each 4 step ups x 10   08/10/23 Standing lumbar extension Supine  Single knee to chest 3 x 20 Quad set 5 hold x 10 SAQ 5 hold x 10 SLR x 15  Sit to stand no UE assist from low mat x 10 Sit to stand holding yellow med ball from  low mat x 10 Standing: Heel raises 2 x 10 Slant board 5 x 20 Tandem stance x 30 each SLS 3 x 10 each Mini squats 2 x 10  08/05/23: Supine: Quad set 10x 5 SAQ 10x 5 AROM 0-112 SLR 10x  no brace or AD 370  Standing: Heel raises 15x Toe raises 15x Minisquats 10x 4in step up with BUE A TKE with GTB 10x5  Seated STS eccentric control 10x no HHA  07/23/23 Supine: Quad sets 5 hold x 10 Ankle  pumps x 20 Glute sets 5 hold x 10 Heel slides with strap x 10; AAROM 88 degrees today  Left sidelying: right hip abduction 2 x 10 Clam 2 x 10  Seated: Hamstring stretch 5 x 20 Heel slides for flexion x 10  Gait training with brace opened up to 40 degrees and using 1 crutch  Standing: Heel raises x 10 Slant board 3 x 20  07/16/23  Review of HEP and goals Supine: Quad sets 5 holds x 10 Ankle pumps x 20 Right heel slides with strap x 10 Adjusted right knee brace Standing: heel raises x 10 Weight shifts x 10    06/30/23 physical therapy evaluation and HEP instruction    PATIENT EDUCATION:  Education details: Patient educated on exam findings, POC, scope of PT, HEP, and what to expect visit. Person educated: Patient Education method: Explanation, Demonstration, and Handouts Education comprehension: verbalized understanding, returned demonstration, verbal cues required, and tactile cues required  HOME EXERCISE PROGRAM: 07/23/23 - Clamshell  - 2 x daily - 7 x weekly - 2 sets - 10 reps - Sidelying Hip Abduction  - 2 x daily - 7 x weekly - 2 sets - 10 reps - Seated Heel Slide  - 2 x daily - 7 x weekly - 2 sets - 10 reps - Seated Hamstring Stretch  - 2 x daily - 7 x weekly - 1 sets - 5 reps - 20 sec hold  Access Code: [email or web address] URL: [email or web address] Date: 06/30/2023 Prepared by: [facility]  Exercises - Supine Quad Set  - 2 x daily - 7 x weekly - 1 sets - 10 reps - 5 sec hold - gentle 4 way patellar glides  - 2 x daily - 7 x weekly - 1 sets - 10 reps - Supine Ankle Pumps  - 2 x daily - 7 x weekly - 2 sets - 10 reps - Seated Ankle Pumps  - 2 x daily - 7 x weekly - 2 sets - 10 reps - Supine Active Straight Leg Raise  - 2 x daily - 7 x weekly - 1 sets - 10 reps Access Code: [email or web address] URL: [email or web address] Date: 07/16/2023 Prepared by: [facility]  Exercises - Supine Quad Set  - 2 x daily - 7 x weekly - 1 sets - 10 reps - 5 sec  hold - gentle 4 way patellar glides  - 2 x daily - 7 x weekly - 1 sets - 10 reps - Supine Ankle Pumps  - 2 x daily - 7 x weekly - 2 sets - 10 reps - Seated Ankle Pumps  - 2 x daily - 7 x weekly - 2 sets - 10 reps - Supine Active Straight Leg Raise  - 2 x daily - 7 x weekly - 1 sets - 10 reps - Supine Heel Slide with Strap  - 1 x daily - 7 x weekly - 1 sets - 10 reps - Standing Heel Raise with Support  -  1 x daily - 7 x weekly - 1 sets - 10 reps - Side to Side Weight Shift with Counter Support  - 1 x daily - 7 x weekly - 1 sets - 10 reps ASSESSMENT:  CLINICAL IMPRESSION: Patient 11 weeks s/p.  Added hamstring curls to treatment today with any report of pain.  Added hip vectors to strengthen hips and glutes and improve balance on contralateral leg. Added foam beam to tandem stance for increased balance challenge today.  Increased height of step ups to simulate going up a steeper step; she still has some issue with smoothness of motion with stepping up and down due to leg weakness. Ended with lumbar extensions as patient continues with forward flexed trunk with standing/walking.   Patient will benefit from continued skilled therapy services to address deficits and promote return to optimal function.         Eval:Patient is a 43 y.o. female who was seen today for physical therapy evaluation and treatment for s/p M23.90 (ICD-10-CM) - Patellar malalignment syndrome, unspecified laterality.  Patient demonstrates muscle weakness, reduced ROM, and fascial restrictions which are likely contributing to symptoms of pain and are negatively impacting patient ability to perform ADLs and functional mobility tasks. Patient will benefit from skilled physical therapy services to address these deficits to reduce pain and improve level of function with ADLs and functional mobility tasks.   OBJECTIVE IMPAIRMENTS: Abnormal gait, decreased activity tolerance, decreased endurance, decreased knowledge of use of DME,  decreased mobility, difficulty walking, decreased ROM, decreased strength, increased edema, increased fascial restrictions, impaired perceived functional ability, impaired flexibility, and pain.   ACTIVITY LIMITATIONS: lifting, bending, sitting, standing, squatting, sleeping, stairs, transfers, bed mobility, bathing, toileting, dressing, locomotion level, and caring for others  PARTICIPATION LIMITATIONS: meal prep, cleaning, laundry, driving, shopping, community activity, occupation, and church  PERSONAL FACTORS:  back pain and left knee pain  are also affecting patient's functional outcome.   REHAB POTENTIAL: Good  CLINICAL DECISION MAKING: Stable/uncomplicated  EVALUATION COMPLEXITY: Low   GOALS: Goals reviewed with patient? No  SHORT TERM GOALS: Target date: 07/28/2023 patient will be independent with initial HEP  Baseline: Goal status: IN progress   2.  Patient will improve right knee mobility to 0 to 45 degrees to promote progression toward normalizing gait pattern Baseline:  Goal status: in progress    LONG TERM GOALS: Target date: 08/18/2023  Patient will improve FOTO score by 10 points to demonstrate improved perceived functional mobility  Baseline: 29 Goal status: in progress  2.  Patient will ambulate without AD and minimal gait deviation household and short community distances to safely grocery shop Baseline:  Goal status: in progress  3.  Patient will increase right knee mobility to 0 to 120 to promote normal navigation of steps; step over step pattern  Baseline: see above Goal status: IN progress  4.  Patient will increase right  leg MMTs to 4+ to 5/5 without pain to promote return to ambulation community distances with minimal deviation. (Goal Date 12 weeks out per protocol)  Baseline: see above Goal status: IN progress  PLAN:  PT FREQUENCY: 2x/week  PT DURATION: 8 weeks  PLANNED INTERVENTIONS: [id number]- PT Re-evaluation, 97110-Therapeutic exercises,  97530- Therapeutic activity, 97112- Neuromuscular re-education, 97535- Self Care, [id number]- Manual therapy, [phone]- Gait training, [phone]- Orthotic Fit/training, [phone]- Canalith repositioning, [id number]- Aquatic Therapy, [phone]- Splinting, Patient/Family education, Balance training, Stair training, Taping, Dry Needling, Joint mobilization, Joint manipulation, Spinal manipulation, Spinal mobilization, Scar mobilization, and DME instructions.  PLAN FOR NEXT SESSION: progress per protocol ; patient s/p 8 weeks on 08/11/23.  2:29 PM, 09/02/23 Abena Erdman Small Betsi Crespi MPT [facility] [place] [phone]

## 2023-09-03 ENCOUNTER — Telehealth (INDEPENDENT_AMBULATORY_CARE_PROVIDER_SITE_OTHER): Payer: Self-pay | Admitting: Professional Counselor

## 2023-09-03 DIAGNOSIS — F411 Generalized anxiety disorder: Secondary | ICD-10-CM | POA: Diagnosis not present
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
## 2023-09-04 ENCOUNTER — Ambulatory Visit: Payer: Medicare Other | Admitting: Professional Counselor

## 2023-09-07 ENCOUNTER — Encounter (HOSPITAL_COMMUNITY): Payer: Medicare Other

## 2023-09-09 ENCOUNTER — Encounter (HOSPITAL_COMMUNITY): Payer: Medicare Other | Admitting: Physical Therapy

## 2023-09-11 ENCOUNTER — Ambulatory Visit: Payer: Medicare Other | Admitting: Professional Counselor

## 2023-09-11 DIAGNOSIS — E559 Vitamin D deficiency, unspecified: Secondary | ICD-10-CM | POA: Diagnosis not present

## 2023-09-11 DIAGNOSIS — M1712 Unilateral primary osteoarthritis, left knee: Secondary | ICD-10-CM | POA: Diagnosis not present

## 2023-09-11 DIAGNOSIS — I10 Essential (primary) hypertension: Secondary | ICD-10-CM | POA: Diagnosis not present

## 2023-09-11 DIAGNOSIS — G8929 Other chronic pain: Secondary | ICD-10-CM | POA: Diagnosis not present

## 2023-09-11 DIAGNOSIS — R7303 Prediabetes: Secondary | ICD-10-CM | POA: Diagnosis not present

## 2023-09-11 DIAGNOSIS — R030 Elevated blood-pressure reading, without diagnosis of hypertension: Secondary | ICD-10-CM | POA: Diagnosis not present

## 2023-09-11 DIAGNOSIS — M542 Cervicalgia: Secondary | ICD-10-CM | POA: Diagnosis not present

## 2023-09-11 DIAGNOSIS — M47812 Spondylosis without myelopathy or radiculopathy, cervical region: Secondary | ICD-10-CM | POA: Diagnosis not present

## 2023-09-11 DIAGNOSIS — M25561 Pain in right knee: Secondary | ICD-10-CM | POA: Diagnosis not present

## 2023-09-11 DIAGNOSIS — Z79899 Other long term (current) drug therapy: Secondary | ICD-10-CM | POA: Diagnosis not present

## 2023-09-14 ENCOUNTER — Ambulatory Visit (HOSPITAL_COMMUNITY): Payer: Medicare Other

## 2023-09-14 DIAGNOSIS — R262 Difficulty in walking, not elsewhere classified: Secondary | ICD-10-CM

## 2023-09-14 DIAGNOSIS — M25661 Stiffness of right knee, not elsewhere classified: Secondary | ICD-10-CM

## 2023-09-14 DIAGNOSIS — M25361 Other instability, right knee: Secondary | ICD-10-CM

## 2023-09-14 NOTE — Therapy (Signed)
OUTPATIENT PHYSICAL THERAPY LOWER EXTREMITY TREATMENT   Patient Name: Martha Montgomery MRN: [id number] DOB:11/14/1980, 43 y.o., female Today's Date: 09/14/2023  END OF SESSION:  PT End of Session - 09/14/23 1351     Visit Number 8    Number of Visits 16    Date for PT Re-Evaluation 09/19/23    Authorization Type [employer] Medicare    PT Start Time 1350    PT Stop Time 1430    PT Time Calculation (min) 40 min    Activity Tolerance Patient tolerated treatment well    Behavior During Therapy WFL for tasks assessed/performed              Past Medical History:  Diagnosis Date   Anxiety    Anxiety disorder    Arthritis    "knees, joints, hands, toes" (09/28/2013)   Depression    Depression, major, single episode, in partial remission (HCC) 10/18/2008   Qualifier: Diagnosis of  By: Lodema Hong MD, Margaret  PHQ 9 score of 7 in 10/2017, not suicidal or homicidal, interested in telepsych however, score of 16 in  02/2018, not suicidal or homicidal, start medication and refer to telepsych Score of 7 in 07/2018    Fibromyalgia    Gastritis nov. 2011   EGD by Dr. Darrick Penna, negative h pylori   GERD (gastroesophageal reflux disease)    Gout    Hypertension x 6 years    Hypothyroidism    Migraines    "15/month average" (09/28/2013)   Obesity (BMI 30.0-34.9)    Spinal stenosis    Thyroid goiter    Past Surgical History:  Procedure Laterality Date   BIOPSY  10/20/2017   Procedure: BIOPSY;  Surgeon: West Bali, MD;  [facility]: [facility];  Service: Endoscopy;;  gastric duodenal esophagus   CESAREAN SECTION  2008   CHONDROPLASTY  04/02/2012   Procedure: CHONDROPLASTY;  Surgeon: Vickki Hearing, MD;  Location: [facility];  Service: Orthopedics;  Laterality: Left;  of left patella   COLONOSCOPY  11/2010   Dr. Darrick Penna: normal colon, small internal hemorrhoids   COLONOSCOPY WITH PROPOFOL N/A 10/22/2017   Surgeon: West Bali, MD; significant looping of the colon, internal hemorrhoids,  otherwise normal exam.  Recommended 10-year surveillance.   COLONOSCOPY WITH PROPOFOL N/A 10/20/2017   Procedure: COLONOSCOPY WITH PROPOFOL;  Surgeon: West Bali, MD;  [facility];  Service: Endoscopy;  Laterality: N/A;  11:00am   ESOPHAGOGASTRODUODENOSCOPY (EGD) WITH PROPOFOL N/A 10/20/2017   Surgeon: West Bali, MD; no endoscopic abnormality to explain dysphagia s/p empiric dilation, gastritis.  Esophageal biopsies consistent with reflux, gastric biopsies with gastric antral and oxyntic mucosa negative for H. pylori, duodenal biopsies benign.   INCISION AND DRAINAGE ABSCESS  1997; 2005   "under right buttocks; under left arm"   KNEE ARTHROSCOPY Left 2021   KNEE ARTHROSCOPY WITH MEDIAL PATELLAR FEMORAL LIGAMENT RECONSTRUCTION Right 06/16/2023   Procedure: RIGHT KNEE ARTHROSCOPY WITH MEDIAL PATELLAR FEMORAL LIGAMENT RECONSTRUCTION;  Surgeon: Huel Cote, MD;  Location: [facility];  Service: Orthopedics;  Laterality: Right;   SAVORY DILATION N/A 10/20/2017   Procedure: SAVORY DILATION;  Surgeon: West Bali, MD;  [facility]: [facility];  Service: Endoscopy;  Laterality: N/A;   THYROIDECTOMY N/A 09/28/2013   Procedure: TOTAL THYROIDECTOMY;  Surgeon: Darletta Moll, MD;  Location: [facility];  Service: ENT;  Laterality: N/A;   TIBIA OSTEOTOMY Right 06/16/2023   Procedure: RIGHT KNEE TIBIAL TUBERLE OSTEOTOMY;  Surgeon: Huel Cote, MD;  Location: [facility];  Service: Orthopedics;  Laterality: Right;   UPPER GI ENDOSCOPY  2011   EGD: gastritis, no h.pylori   Patient Active Problem List   Diagnosis Date Noted   Patellar instability of both knees 06/16/2023   GAD (generalized anxiety disorder) 04/20/2023   Mild depression 04/20/2023   Depression, major, single episode, mild (HCC) 04/20/2023   Decreased vision 04/20/2023   Bladder distension 04/20/2023   Encounter for surveillance of injectable contraceptive 11/07/2022   Encounter for Medicare annual examination with abnormal  findings 08/07/2022   Hyperlipidemia 06/08/2022   Worm infestation 05/12/2022   Generalized joint pain 03/26/2022   Twitching 09/03/2021   Toxic effect of kerosene 09/03/2021   Hypertension    Finger pain, right 07/31/2020   Bilateral groin pain 07/31/2020   Dental caries 04/24/2019   Morbid obesity due to excess calories (HCC) 01/08/2019   Multiple benign lumps of breast 08/17/2018   Hemorrhoids 05/19/2018   Encounter for contraceptive management 03/20/2018   Low vitamin D level 11/30/2017   Bloating 03/05/2017   Bilateral knee pain 01/05/2017   Amenorrhea 01/05/2017   Bilateral hip pain 07/22/2016   Post-surgical hypothyroidism 06/02/2014   Knee pain 06/02/2012   Patellar malalignment syndrome 03/09/2012   IBS (irritable bowel syndrome) 08/29/2011   GERD (gastroesophageal reflux disease) 08/29/2011   Back pain 09/24/2010   Migraine headache 04/02/2010   Constipation 09/12/2009   Allergic rhinitis 06/16/2009    PCP: Syliva Overman  REFERRING PROVIDER: Huel Cote, MD  REFERRING DIAG: M23.90 (ICD-10-CM) - Patellar malalignment syndrome, unspecified laterality  THERAPY DIAG:  Patellar instability of right knee  Difficulty in walking, not elsewhere classified  Knee stiffness, right  Rationale for Evaluation and Treatment: Rehabilitation  ONSET DATE: 06/16/2023  SUBJECTIVE:   SUBJECTIVE STATEMENT: Has been hurting quite a bit lately but her pain management MD gave her depo/toradol mix that has helped her some.  Her son is supposed to have upcoming surgery; her left knee is feeling more unstable.  She is s/p 12 weeks now  EVAL:S/p Medial Patella Femoral ligament reconstruction per Dr. Steward Drone 06/16/23; right knee instability; "tearing sound" with steps especially She jarred her knee a couple times; "overstretched the knee" and is hearing some "cracking" but had her immobilizer on both times.   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   Patient arrives in immobilizer with crutches;  states she is "supposed to get a new brace today" but PT informs her this is likely to be done at her MD office; patient instructed to call her surgeon's office regarding follow up appointment for brace an to get sutures out.    PERTINENT HISTORY: Have had 2 surgeries on left knee already for instability Pinched nerve in back running down leg PAIN:  Are you having pain? Yes: NPRS scale: 4/10 Pain location: right knee, anterior knee Pain description: burning, constant ache; sometimes sharp pain; spasm  Aggravating factors: jarring  Relieving factors: pain meds, ice  PRECAUTIONS: Knee Right knee  RED FLAGS: None noted  WEIGHT BEARING RESTRICTIONS: Yes heel touch only   FALLS:  Has patient fallen in last 6 months? 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  DIAGNOSTIC FINDINGS:   PATIENT SURVEYS:  FOTO 29  COGNITION: Overall cognitive status: Within functional limits for tasks assessed     SENSATION: WFL  EDEMA:  Normal for this time s/p   POSTURE: rounded shoulders, forward head, and weight shift left  PALPATION: General tenderness right knee   LOWER EXTREMITY ROM:  Active ROM Right eval Left eval Right  07/16/23 Right 07/23/23 Right 08/05/23  Hip flexion       Hip extension       Hip abduction       Hip adduction       Hip internal rotation       Hip external rotation       Knee flexion   60 AAROM 88 112  Knee  extension -12 hyperextends -5  0  Ankle dorsiflexion       Ankle plantarflexion       Ankle inversion       Ankle eversion        (Blank rows = not tested)  LOWER EXTREMITY MMT:  MMT Right eval Left eval  Hip flexion    Hip extension    Hip abduction    Hip adduction    Hip internal rotation    Hip external rotation    Knee flexion    Knee extension Poor quad set   Ankle dorsiflexion    Ankle plantarflexion    Ankle inversion    Ankle eversion     (Blank rows = not tested)  FUNCTIONAL TESTS:  30 seconds chair stand test x 1  GAIT: Distance walked: 60 ft in clinic Assistive device utilized: Crutches Level of assistance: SBA Comments: heel touch weightbearing right leg   TODAY'S TREATMENT:                                                                                                                              DATE:  09/14/23 Nustep seat 8 x 5' dynamic warm up level 2 Cybex Hamstring curls 3 plates 2 x 10 Bodycraft leg press 3 plates 2 x 10 Standing: Heel raises on incline 2 x 10 Slant board 5 x 20" BOSU ball alternating lunges 2 x 10 2# hip abduction x 15 each 2# hip extension x 15 each   09/02/23 Nustep seat 8 x 5' dynamic warm up level 2 Cybex Hamstring curls 3 plates 2 x 10 Heel raises 2 x 10 on step  Calf stretch 5 x 20" against wall Hip vectors 2" hold 2 x 8 each Foam beam tandem stance 3 x 20" each way 6" step ups 2 x 10 Lumbar extensions over bar x 10   08/24/23 Nustep seat 8 x 5' dynamic warm up Standing: Heel raises 2 x 10 Slant board 5 x 20" Lumbar extension  x 10" x 10 Sit to stand from chair no UE assist x 10 Sit to stand from chair with yellow med ball x 10 Tandem stance 2 x 30" each 4" step ups x 10   08/10/23 Standing lumbar extension Supine  Single knee to chest 3 x 20" Quad set 5" hold x 10 SAQ 5" hold x 10 SLR x 15  Sit to stand no UE assist from low mat x 10 Sit to stand holding yellow med ball from low mat x  10 Standing: Heel raises 2 x 10 Slant board 5 x 20" Tandem stance x 30" each SLS 3 x 10" each Mini squats 2 x 10  06/30/23 physical therapy evaluation and HEP instruction    PATIENT EDUCATION:  Education details: Patient educated on exam findings, POC, scope of PT, HEP, and what to expect visit. Person educated: Patient Education method: Explanation, Demonstration, and Handouts Education comprehension: verbalized understanding, returned demonstration, verbal cues required, and tactile cues required  HOME EXERCISE PROGRAM: 07/23/23 - Clamshell  - 2 x daily - 7 x weekly - 2 sets - 10 reps - Sidelying Hip Abduction  - 2 x daily - 7 x weekly - 2 sets - 10 reps - Seated Heel Slide  - 2 x daily - 7 x weekly - 2 sets - 10 reps - Seated Hamstring Stretch  - 2 x daily - 7 x weekly - 1 sets - 5 reps - 20 sec hold  Access Code: [email or web address] URL: [email or web address] Date: 06/30/2023 Prepared by: [facility]  Exercises - Supine Quad Set  - 2 x daily - 7 x weekly - 1 sets - 10 reps - 5 sec hold - gentle 4 way patellar glides  - 2 x daily - 7 x weekly - 1 sets - 10 reps - Supine Ankle Pumps  - 2 x daily - 7 x weekly - 2 sets - 10 reps - Seated Ankle Pumps  - 2 x daily - 7 x weekly - 2 sets - 10 reps - Supine Active Straight Leg Raise  - 2 x daily - 7 x weekly - 1 sets - 10 reps Access Code: [email or web address] URL: [email or web address] Date: 07/16/2023 Prepared by: [facility]  Exercises - Supine Quad Set  - 2 x daily - 7 x weekly - 1 sets - 10 reps - 5 sec hold - gentle 4 way patellar glides  - 2 x daily - 7 x weekly - 1 sets - 10 reps - Supine Ankle Pumps  - 2 x daily - 7 x weekly - 2 sets - 10 reps - Seated Ankle Pumps  - 2 x daily - 7 x weekly - 2 sets - 10 reps - Supine Active Straight Leg Raise  - 2 x daily - 7 x weekly - 1 sets - 10 reps - Supine Heel Slide with Strap  - 1 x daily - 7 x weekly - 1 sets - 10 reps - Standing Heel Raise with Support  - 1 x daily - 7 x  weekly - 1 sets - 10 reps - Side to Side Weight Shift with Counter Support  - 1 x daily - 7 x weekly - 1 sets - 10 reps ASSESSMENT:  CLINICAL IMPRESSION: Patient 12 weeks s/p.  Added leg press to treatment today with any report of pain. Continued with general strengthening and balance training per protocol.  Progressed to alternating lunges onto BOSU with good challenge.  Needs cues to avoid trunk substitution  with hip exercise.   Patient will benefit from continued skilled therapy services to address deficits and promote return to optimal function.         Eval:Patient is a 43 y.o. female who was seen today for physical therapy evaluation and treatment for s/p M23.90 (ICD-10-CM) - Patellar malalignment syndrome, unspecified laterality.  Patient demonstrates muscle weakness, reduced ROM, and fascial restrictions which are likely contributing to symptoms of pain and are negatively impacting patient ability to perform ADLs and functional mobility tasks. Patient will benefit from skilled physical therapy services to address these deficits to reduce pain and improve level of function with ADLs and functional mobility tasks.   OBJECTIVE IMPAIRMENTS: Abnormal gait, decreased activity tolerance, decreased endurance, decreased knowledge of use of DME, decreased mobility, difficulty walking, decreased ROM, decreased strength, increased edema, increased fascial restrictions, impaired perceived functional ability, impaired flexibility, and pain.   ACTIVITY LIMITATIONS: lifting, bending, sitting, standing, squatting, sleeping, stairs, transfers, bed mobility, bathing, toileting, dressing, locomotion level, and caring for others  PARTICIPATION LIMITATIONS: meal prep, cleaning, laundry, driving, shopping, community activity, occupation, and church  PERSONAL FACTORS:  back pain and left knee pain  are also affecting patient's functional outcome.   REHAB POTENTIAL: Good  CLINICAL DECISION MAKING:  Stable/uncomplicated  EVALUATION COMPLEXITY: Low   GOALS: Goals reviewed with patient? No  SHORT TERM GOALS: Target date: 07/28/2023 patient will be independent with initial HEP  Baseline: Goal status: IN progress   2.  Patient will improve right knee mobility to 0 to 45 degrees to promote progression toward normalizing gait pattern Baseline:  Goal status: in progress    LONG TERM GOALS: Target date: 08/18/2023  Patient will improve FOTO score by 10 points to demonstrate improved perceived functional mobility  Baseline: 29 Goal status: in progress  2.  Patient will ambulate without AD and minimal gait deviation household and short community distances to safely grocery shop Baseline:  Goal status: in progress  3.  Patient will increase right knee mobility to 0 to 120 to promote normal navigation of steps; step over step pattern  Baseline: see above Goal status: IN progress  4.  Patient will increase right  leg MMTs to 4+ to 5/5 without pain to promote return to ambulation community distances with minimal deviation. (Goal Date 12 weeks out per protocol)  Baseline: see above Goal status: IN progress  PLAN:  PT FREQUENCY: 2x/week  PT DURATION: 8 weeks  PLANNED INTERVENTIONS: 97164- PT Re-evaluation, 97110-Therapeutic exercises, 97530- Therapeutic activity, 97112- Neuromuscular re-education, 97535- Self Care, 16109- Manual therapy, [phone]- Gait training, [phone]- Orthotic Fit/training, [phone]- Canalith repositioning, [id number]- Aquatic Therapy, [phone]- Splinting, Patient/Family education, Balance training, Stair training, Taping, Dry Needling, Joint mobilization, Joint manipulation, Spinal manipulation, Spinal mobilization, Scar mobilization, and DME instructions.   PLAN FOR NEXT SESSION: progress per protocol ; patient s/p 12 weeks on 09/08/23. Reassess next visit  2:31 PM, 09/14/23 Meshell Abdulaziz [place] Presley MPT [facility] [place] [phone]

## 2023-09-15 ENCOUNTER — Ambulatory Visit: Payer: Self-pay | Admitting: Licensed Clinical Social Worker
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
## 2023-09-16 ENCOUNTER — Ambulatory Visit (HOSPITAL_COMMUNITY): Payer: Medicare Other

## 2023-09-16 DIAGNOSIS — R262 Difficulty in walking, not elsewhere classified: Secondary | ICD-10-CM

## 2023-09-16 DIAGNOSIS — M25661 Stiffness of right knee, not elsewhere classified: Secondary | ICD-10-CM | POA: Diagnosis not present

## 2023-09-16 DIAGNOSIS — M25361 Other instability, right knee: Secondary | ICD-10-CM

## 2023-09-16 NOTE — Therapy (Addendum)
OUTPATIENT PHYSICAL THERAPY LOWER EXTREMITY TREATMENT PHYSICAL THERAPY DISCHARGE SUMMARY  Visits from Start of Care: 9  Current functional level related to goals / functional outcomes: See below   Remaining deficits: See below   Education / Equipment: HEP   Patient agrees to discharge. Patient goals were partially met. Patient did not meet goal fully for right knee extension.  Patient is being discharged due to being pleased with the current functional level.        Patient Name: JAKHIYA BROWER MRN: [id number] DOB:January 19, 1981, 43 y.o., female, female Today's Date: 09/16/2023  END OF SESSION:  PT End of Session - 09/16/23 1022     Visit Number 9    Number of Visits 16    Date for PT Re-Evaluation 09/19/23    Authorization Type [employer] Medicare    PT Start Time 1015    PT Stop Time 1055    PT Time Calculation (min) 40 min    Activity Tolerance Patient tolerated treatment well    Behavior During Therapy WFL for tasks assessed/performed               Past Medical History:  Diagnosis Date   Anxiety    Anxiety disorder    Arthritis    "knees, joints, hands, toes" (09/28/2013)   Depression    Depression, major, single episode, in partial remission (HCC) 10/18/2008   Qualifier: Diagnosis of  By: Lodema Hong MD, Margaret  PHQ 9 score of 7 in 10/2017, not suicidal or homicidal, interested in telepsych however, score of 16 in  02/2018, not suicidal or homicidal, start medication and refer to telepsych Score of 7 in 07/2018    Fibromyalgia    Gastritis nov. 2011   EGD by Dr. Darrick Penna, negative h pylori   GERD (gastroesophageal reflux disease)    Gout    Hypertension x 6 years    Hypothyroidism    Migraines    "15/month average" (09/28/2013)   Obesity (BMI 30.0-34.9)    Spinal stenosis    Thyroid goiter    Past Surgical History:  Procedure Laterality Date   BIOPSY  10/20/2017   Procedure: BIOPSY;  Surgeon: West Bali, MD;  [facility]: [facility];  Service: Endoscopy;;   gastric duodenal esophagus   CESAREAN SECTION  2008   CHONDROPLASTY  04/02/2012   Procedure: CHONDROPLASTY;  Surgeon: Vickki Hearing, MD;  Location: [facility];  Service: Orthopedics;  Laterality: Left;  of left patella   COLONOSCOPY  11/2010   Dr. Darrick Penna: normal colon, small internal hemorrhoids   COLONOSCOPY WITH PROPOFOL N/A 10/22/2017   Surgeon: West Bali, MD; significant looping of the colon, internal hemorrhoids, otherwise normal exam.  Recommended 10-year surveillance.   COLONOSCOPY WITH PROPOFOL N/A 10/20/2017   Procedure: COLONOSCOPY WITH PROPOFOL;  Surgeon: West Bali, MD;  [facility]: [facility];  Service: Endoscopy;  Laterality: N/A;  11:00am   ESOPHAGOGASTRODUODENOSCOPY (EGD) WITH PROPOFOL N/A 10/20/2017   Surgeon: West Bali, MD; no endoscopic abnormality to explain dysphagia s/p empiric dilation, gastritis.  Esophageal biopsies consistent with reflux, gastric biopsies with gastric antral and oxyntic mucosa negative for H. pylori, duodenal biopsies benign.   INCISION AND DRAINAGE ABSCESS  1997; 2005   "under right buttocks; under left arm"   KNEE ARTHROSCOPY Left 2021   KNEE ARTHROSCOPY WITH MEDIAL PATELLAR FEMORAL LIGAMENT RECONSTRUCTION Right 06/16/2023   Procedure: RIGHT KNEE ARTHROSCOPY WITH MEDIAL PATELLAR FEMORAL LIGAMENT RECONSTRUCTION;  Surgeon: Huel Cote, MD;  Location: [facility];  Service: Orthopedics;  Laterality: Right;   SAVORY DILATION N/A 10/20/2017   Procedure: SAVORY DILATION;  Surgeon: West Bali, MD;  Location: [facility];  Service: Endoscopy;  Laterality: N/A;   THYROIDECTOMY N/A 09/28/2013   Procedure: TOTAL THYROIDECTOMY;  Surgeon: Darletta Moll, MD;  Location: [facility] OR;  Service: ENT;  Laterality: N/A;   TIBIA OSTEOTOMY Right 06/16/2023   Procedure: RIGHT KNEE TIBIAL TUBERLE OSTEOTOMY;  Surgeon: Huel Cote, MD;  Location: [facility] OR;  Service: Orthopedics;  Laterality: Right;   UPPER GI ENDOSCOPY  2011   EGD: gastritis, no h.pylori    Patient Active Problem List   Diagnosis Date Noted   Patellar instability of both knees 06/16/2023   GAD (generalized anxiety disorder) 04/20/2023   Mild depression 04/20/2023   Depression, major, single episode, mild (HCC) 04/20/2023   Decreased vision 04/20/2023   Bladder distension 04/20/2023   Encounter for surveillance of injectable contraceptive 11/07/2022   Encounter for Medicare annual examination with abnormal findings 08/07/2022   Hyperlipidemia 06/08/2022   Worm infestation 05/12/2022   Generalized joint pain 03/26/2022   Twitching 09/03/2021   Toxic effect of kerosene 09/03/2021   Hypertension    Finger pain, right 07/31/2020   Bilateral groin pain 07/31/2020   Dental caries 04/24/2019   Morbid obesity due to excess calories (HCC) 01/08/2019   Multiple benign lumps of breast 08/17/2018   Hemorrhoids 05/19/2018   Encounter for contraceptive management 03/20/2018   Low vitamin D level 11/30/2017   Bloating 03/05/2017   Bilateral knee pain 01/05/2017   Amenorrhea 01/05/2017   Bilateral hip pain 07/22/2016   Post-surgical hypothyroidism 06/02/2014   Knee pain 06/02/2012   Patellar malalignment syndrome 03/09/2012   IBS (irritable bowel syndrome) 08/29/2011   GERD (gastroesophageal reflux disease) 08/29/2011   Back pain 09/24/2010   Migraine headache 04/02/2010   Constipation 09/12/2009   Allergic rhinitis 06/16/2009    PCP: Syliva Overman  REFERRING PROVIDER: Huel Cote, MD  REFERRING DIAG: M23.90 (ICD-10-CM) - Patellar malalignment syndrome, unspecified laterality  THERAPY DIAG:  Patellar instability of right knee  Difficulty in walking, not elsewhere classified  Knee stiffness, right  Rationale for Evaluation and Treatment: Rehabilitation  ONSET DATE: 06/16/2023  SUBJECTIVE:   SUBJECTIVE STATEMENT: Pt states she feels she is at about 85%. She states her pain has still been bothering her and claims it could be related to the cold weather.  Pt states she feels she is good to discharge today and says she will continue to comply with revised HEP.   EVAL:S/p Medial Patella Femoral ligament reconstruction per Dr. Steward Drone 06/16/23; right knee instability; "tearing sound" with steps especially She jarred her knee a couple times; "overstretched the knee" and is hearing some "cracking" but had her immobilizer on both times.   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  Patient arrives in immobilizer with crutches;  states she is "supposed to get a new brace today" but PT informs her this is likely to be done at her MD office; patient instructed to call her surgeon's office regarding follow up appointment for brace an to get sutures out.    PERTINENT HISTORY: Have had 2 surgeries on left knee already for instability Pinched nerve in back running down leg PAIN:  Are you having pain? Yes: NPRS scale: 4/10 Pain location: right knee, anterior knee Pain description: burning, constant ache; sometimes sharp pain; spasm  Aggravating factors: jarring  Relieving factors: pain meds, ice  PRECAUTIONS: Knee Right knee  RED FLAGS: None noted  WEIGHT BEARING RESTRICTIONS: Yes heel touch only   FALLS:  Has patient fallen in last 6 months? 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  DIAGNOSTIC FINDINGS:   PATIENT SURVEYS:  FOTO 29  COGNITION: Overall cognitive status: Within functional limits for tasks  assessed     SENSATION: WFL  EDEMA:  Normal for this time s/p   POSTURE: rounded shoulders, forward head, and weight shift left  PALPATION: General tenderness right knee   LOWER EXTREMITY ROM:  Active ROM Right eval Left eval Right  07/16/23 Right 07/23/23 Right 08/05/23 Right 09/16/23  Hip flexion        Hip extension        Hip abduction        Hip adduction        Hip internal rotation        Hip external rotation        Knee flexion   60 AAROM 88 112 124  Knee extension -12 hyperextends -5  0 -5  Ankle dorsiflexion        Ankle plantarflexion        Ankle inversion        Ankle eversion         (Blank rows = not tested)  LOWER EXTREMITY MMT:  MMT Right eval Left eval Right 09/16/23  Hip flexion   4+  Hip extension   4+  Hip abduction     Hip adduction     Hip internal rotation     Hip external rotation     Knee flexion   5  Knee extension Poor quad set  4+  Ankle dorsiflexion   5  Ankle plantarflexion     Ankle inversion     Ankle eversion      (Blank rows = not tested)  FUNCTIONAL TESTS:  30 seconds chair stand test x 1 09/16/23: 30 seconds chair stand test x13  GAIT: Distance walked: 60 ft in clinic Assistive device utilized: Crutches Level of assistance: SBA Comments: heel touch weightbearing right leg   TODAY'S TREATMENT:                                                                                                                              DATE:  09/16/23 Nustep Seat 8, 5 minutes, level 2 resistance ROM measurements MMT testing Seated hamstring curls, 1 set of 10 reps, 3 plates Leg press machine, 1 set of 10 reps,  4 plate Heel raises on incline 2 x 10 Slant board 5 x 20" Squats to chair with parallel bars for UE support 1 set of 10 reps  09/14/23 Nustep seat 8 x 5' dynamic warm up level 2 Cybex Hamstring curls 3 plates 2 x 10 Bodycraft leg press 3 plates 2 x 10 Standing: Heel raises on incline 2 x 10 Slant board 5 x  20" BOSU ball alternating lunges 2 x 10 2# hip abduction x 15 each 2# hip extension x 15 each   09/02/23 Nustep seat 8 x 5' dynamic warm up level 2 Cybex Hamstring curls 3 plates 2 x 10 Heel raises 2 x 10 on step  Calf stretch 5 x 20" against wall Hip vectors 2" hold 2 x 8 each Foam beam tandem stance 3 x 20" each way 6" step ups 2 x 10 Lumbar extensions over bar x 10   08/24/23 Nustep seat 8 x 5' dynamic warm up Standing: Heel raises 2 x 10 Slant board 5 x 20" Lumbar extension x 10" x 10 Sit to stand from chair no UE assist x 10 Sit to stand from chair with yellow med ball x 10 Tandem stance 2 x 30" each 4" step ups x 10   08/10/23 Standing lumbar extension Supine  Single knee to chest 3 x 20" Quad set 5" hold x 10 SAQ 5" hold x 10 SLR x 15  Sit to stand no UE assist from low mat x 10 Sit to stand holding yellow med ball from low mat x 10 Standing: Heel raises 2 x 10 Slant board 5 x 20" Tandem stance x 30" each SLS 3 x 10" each Mini squats 2 x 10  06/30/23 physical therapy evaluation and HEP instruction    PATIENT EDUCATION:  Education details: Patient educated on exam findings, POC, scope of PT, HEP, and what to expect visit. Person educated: Patient Education method: Explanation, Demonstration, and Handouts Education comprehension: verbalized understanding, returned demonstration, verbal cues required, and tactile cues required  HOME EXERCISE PROGRAM: 07/23/23 - Clamshell  - 2 x daily - 7 x weekly - 2 sets - 10 reps - Sidelying Hip Abduction  - 2 x daily - 7 x weekly - 2 sets - 10 reps - Seated Heel Slide  - 2 x daily - 7 x weekly - 2 sets - 10 reps - Seated Hamstring Stretch  - 2 x daily - 7 x weekly - 1 sets - 5 reps - 20 sec hold  Access Code: [email or web address] URL: [email or web address] Date: 06/30/2023 Prepared by: [facility]  Exercises - Supine Quad Set  - 2 x daily - 7 x weekly - 1 sets - 10 reps - 5 sec hold - gentle 4 way patellar  glides  - 2 x daily - 7 x weekly - 1 sets - 10 reps - Supine Ankle Pumps  - 2 x daily - 7 x weekly - 2 sets - 10 reps - Seated Ankle Pumps  - 2 x daily - 7 x weekly - 2 sets - 10 reps - Supine Active Straight Leg Raise  - 2 x daily - 7 x weekly - 1 sets - 10 reps Access Code: [email or web address] URL: [email or web address] Date: 07/16/2023 Prepared by: [facility]  Exercises - Supine Quad Set  - 2 x daily - 7 x weekly - 1 sets - 10 reps - 5 sec hold - gentle 4 way patellar glides  - 2 x daily - 7 x weekly -  1 sets - 10 reps - Supine Ankle Pumps  - 2 x daily - 7 x weekly - 2 sets - 10 reps - Seated Ankle Pumps  - 2 x daily - 7 x weekly - 2 sets - 10 reps - Supine Active Straight Leg Raise  - 2 x daily - 7 x weekly - 1 sets - 10 reps - Supine Heel Slide with Strap  - 1 x daily - 7 x weekly - 1 sets - 10 reps - Standing Heel Raise with Support  - 1 x daily - 7 x weekly - 1 sets - 10 reps - Side to Side Weight Shift with Counter Support  - 1 x daily - 7 x weekly - 1 sets - 10 reps ASSESSMENT:  CLINICAL IMPRESSION: Patient has progressed well towards therapy goals and prior level of function. Therapy session continued to involve general strengthening and flexibility training per recommended protocol. Pt has met 5 of 6 goals with good progress with 1 remaining goal. Pt to be discharged at this time to HEP and pt approves of plan of care.    Eval:Patient is a 43 y.o. female who was seen today for physical therapy evaluation and treatment for s/p M23.90 (ICD-10-CM) - Patellar malalignment syndrome, unspecified laterality.  Patient demonstrates muscle weakness, reduced ROM, and fascial restrictions which are likely contributing to symptoms of pain and are negatively impacting patient ability to perform ADLs and functional mobility tasks. Patient will benefit from skilled physical therapy services to address these deficits to reduce pain and improve level of function with ADLs and functional mobility  tasks.   OBJECTIVE IMPAIRMENTS: Abnormal gait, decreased activity tolerance, decreased endurance, decreased knowledge of use of DME, decreased mobility, difficulty walking, decreased ROM, decreased strength, increased edema, increased fascial restrictions, impaired perceived functional ability, impaired flexibility, and pain.   ACTIVITY LIMITATIONS: lifting, bending, sitting, standing, squatting, sleeping, stairs, transfers, bed mobility, bathing, toileting, dressing, locomotion level, and caring for others  PARTICIPATION LIMITATIONS: meal prep, cleaning, laundry, driving, shopping, community activity, occupation, and church  PERSONAL FACTORS:  back pain and left knee pain  are also affecting patient's functional outcome.   REHAB POTENTIAL: Good  CLINICAL DECISION MAKING: Stable/uncomplicated  EVALUATION COMPLEXITY: Low   GOALS: Goals reviewed with patient? No  SHORT TERM GOALS: Target date: 07/28/2023 patient will be independent with initial HEP  Baseline: Goal status: met  2.  Patient will improve right knee mobility to 0 to 45 degrees to promote progression toward normalizing gait pattern Baseline:  Goal status: met    LONG TERM GOALS: Target date: 08/18/2023  Patient will improve FOTO score by 10 points to demonstrate improved perceived functional mobility  Baseline: 29; 58 09/16/23 Goal status: met  2.  Patient will ambulate without AD and minimal gait deviation household and short community distances to safely grocery shop Baseline:  Goal status: Met  3.  Patient will increase right knee mobility to 0 to 120 to promote normal navigation of steps; step over step pattern  Baseline: see above; knee extension -5 Goal status: IN progress, partially met  4.  Patient will increase right  leg MMTs to 4+ to 5/5 without pain to promote return to ambulation community distances with minimal deviation. (Goal Date 12 weeks out per protocol)  Baseline: see above Goal  status:met  PLAN:  PT FREQUENCY: 2x/week  PT DURATION: 8 weeks  PLANNED INTERVENTIONS: 97164- PT Re-evaluation, 97110-Therapeutic exercises, 97530- Therapeutic activity, 97112- Neuromuscular re-education, 97535- Self Care, 16109- Manual therapy,  28413- Gait training, 24401- Orthotic Fit/training, 02725- Canalith repositioning, 36644- Aquatic Therapy, [phone]- Splinting, Patient/Family education, Balance training, Stair training, Taping, Dry Needling, Joint mobilization, Joint manipulation, Spinal manipulation, Spinal mobilization, Scar mobilization, and DME instructions.   PLAN FOR NEXT SESSION: discharge 10:24 AM, 09/16/23 Radford Pease Small Zakariyya Helfman MPT [facility] [place] [phone]

## 2023-10-08 ENCOUNTER — Ambulatory Visit: Payer: Medicare Other | Admitting: Family Medicine

## 2023-10-08 DIAGNOSIS — G8929 Other chronic pain: Secondary | ICD-10-CM | POA: Diagnosis not present

## 2023-10-08 DIAGNOSIS — R7303 Prediabetes: Secondary | ICD-10-CM | POA: Diagnosis not present

## 2023-10-08 DIAGNOSIS — M25562 Pain in left knee: Secondary | ICD-10-CM | POA: Diagnosis not present

## 2023-10-08 DIAGNOSIS — M47812 Spondylosis without myelopathy or radiculopathy, cervical region: Secondary | ICD-10-CM | POA: Diagnosis not present

## 2023-10-08 DIAGNOSIS — M25561 Pain in right knee: Secondary | ICD-10-CM | POA: Diagnosis not present

## 2023-10-08 DIAGNOSIS — D75839 Thrombocytosis, unspecified: Secondary | ICD-10-CM | POA: Diagnosis not present

## 2023-10-08 DIAGNOSIS — I10 Essential (primary) hypertension: Secondary | ICD-10-CM | POA: Diagnosis not present

## 2023-10-08 DIAGNOSIS — M1712 Unilateral primary osteoarthritis, left knee: Secondary | ICD-10-CM | POA: Diagnosis not present

## 2023-10-08 DIAGNOSIS — R030 Elevated blood-pressure reading, without diagnosis of hypertension: Secondary | ICD-10-CM | POA: Diagnosis not present

## 2023-10-08 DIAGNOSIS — Z79899 Other long term (current) drug therapy: Secondary | ICD-10-CM | POA: Diagnosis not present

## 2023-10-08 DIAGNOSIS — E559 Vitamin D deficiency, unspecified: Secondary | ICD-10-CM | POA: Diagnosis not present

## 2023-10-12 ENCOUNTER — Encounter: Payer: Self-pay | Admitting: Family Medicine

## 2023-10-13 DIAGNOSIS — Z79899 Other long term (current) drug therapy: Secondary | ICD-10-CM | POA: Diagnosis not present

## 2023-10-21 ENCOUNTER — Encounter (HOSPITAL_BASED_OUTPATIENT_CLINIC_OR_DEPARTMENT_OTHER): Payer: Medicare Other | Admitting: Orthopaedic Surgery

## 2023-11-06 ENCOUNTER — Ambulatory Visit: Admitting: Professional Counselor

## 2023-11-06 DIAGNOSIS — F411 Generalized anxiety disorder: Secondary | ICD-10-CM | POA: Diagnosis not present

## 2023-11-06 NOTE — Patient Instructions (Signed)
 Martha Montgomery, Martha Montgomery,?NCC, [facility]   Address: [address] [place], [place] [address]   Phone: [phone]   Services: Individual/couples therapy, mental wellness and behavioral coaching    Website: [employer] traffic [employer] in [place], [place] -  [email or web address]

## 2023-11-06 NOTE — BH Specialist Note (Signed)
 [facility] Telephone Follow-up  MRN: [id number] NAME: Martha Montgomery Date: 11/06/23  Start time: Start Time: 0200 End time: Stop Time: 0230 Total time: Total Time in Minutes (Visit): 30 Call number: Visit Number: 3- Third Visit  Reason for call today:  The patient is a 43 year old female who presented for a follow-up visit to discuss the recent psychiatric consultation and determine an appropriate treatment plan. During the session, it was determined that the patient would benefit more from a traditional therapy model due to her need to process ongoing life stressors and unresolved past traumas.  The patient reported that she is taking her prescribed medication as directed and is experiencing benefit from it. However, she expressed that medication alone is not sufficient and acknowledged the need for a therapeutic space where she can more fully explore and process her emotions. Given the structure and time constraints inherent to collaborative care sessions, this model is not well-suited to meet her current therapeutic needs.  The patient agreed with this assessment and expressed openness and readiness to engage in ongoing individual therapy. Behavioral health staff provided her with a referral to a therapist specializing in trauma and anxiety. The patient was receptive to this plan and expressed appreciation for the support.  Given this transition, the collaborative care team will sign off on this case, with confidence that the patient is being referred to a more appropriate level of care.  PHQ-9 Scores:     09/15/2023    1:38 PM 08/21/2023    2:13 PM 07/30/2023    4:05 PM 07/14/2023   12:24 PM 05/25/2023   10:11 AM  Depression screen PHQ 2/9  Decreased Interest 1 1 0 1 1  Down, Depressed, Hopeless 1 1 0 1 1  PHQ - 2 Score 2 2 0 2 2  Altered sleeping 1 1  1 1   Tired, decreased energy 1 1  1 1   Change in appetite 1 2  0 1  Feeling bad or failure about yourself  1 1  1 1    Trouble concentrating 1 2  0 0  Moving slowly or fidgety/restless 0 0  1 0  Suicidal thoughts 0 0  0 0  PHQ-9 Score 7 9  6 6   Difficult doing work/chores Somewhat difficult Somewhat difficult  Somewhat difficult Somewhat difficult   GAD-7 Scores:     08/21/2023    2:28 PM 07/30/2023    4:50 PM 04/16/2023    4:33 PM 04/16/2023    4:14 PM  GAD 7 : Generalized Anxiety Score  Nervous, Anxious, on Edge 1 3 3 1   Control/stop worrying 1 3 2 1   Worry too much - different things 1 3 2 1   Trouble relaxing 1 2 3 1   Restless 1 2 1  0  Easily annoyed or irritable 1 1 2  0  Afraid - awful might happen 1 1 2 1   Total GAD 7 Score 7 15 15 5   Anxiety Difficulty Somewhat difficult   Somewhat difficult    Stress Current stressors:  Caregiver Sleep:  Good Appetite:  Good Coping ability:  Good Patient taking medications as prescribed:  Yes  Current medications:  Outpatient Encounter Medications as of 11/06/2023  Medication Sig   aspirin EC 325 MG tablet Take 1 tablet (325 mg total) by mouth daily.   busPIRone (BUSPAR) 5 MG tablet Take 1 tablet (5 mg total) by mouth 3 (three) times daily.   carboxymethylcellulose (REFRESH PLUS) 0.5 % SOLN Place 1 drop into  both eyes 3 (three) times daily as needed (dry eyes).   Cyanocobalamin (B-12 PO) Take 1 tablet by mouth daily.   diphenhydrAMINE (BENADRYL) 25 MG tablet Take 25 mg by mouth daily.   EPINEPHrine 0.3 mg/0.3 mL IJ SOAJ injection Inject 0.3 mg into the muscle as needed for anaphylaxis.   escitalopram (LEXAPRO) 20 MG tablet Take 1 tablet (20 mg total) by mouth daily.   fluticasone (FLONASE) 50 MCG/ACT nasal spray Place 2 sprays into both nostrils daily. (Patient taking differently: Place 2 sprays into both nostrils daily as needed for allergies.)   Ginger, Zingiber officinalis, (GINGER PO) Take 1 capsule by mouth daily.   levothyroxine (SYNTHROID) 200 MCG tablet TAKE 1 TABLET(200 MCG) BY MOUTH AT BEDTIME   medroxyPROGESTERone (DEPO-PROVERA) 150 MG/ML  injection Inject 150 mg into the muscle every 3 (three) months.   morphine (MSIR) 15 MG tablet Take 15 mg by mouth 3 (three) times daily as needed.   Olmesartan-amLODIPine-HCTZ 40-10-25 MG TABS Take 1 tablet by mouth daily.   oxyCODONE (ROXICODONE) 5 MG immediate release tablet Take 1 tablet (5 mg total) by mouth every 4 (four) hours as needed.   pantoprazole (PROTONIX) 40 MG tablet Take 1 tablet (40 mg total) by mouth 2 (two) times daily before a meal. (Patient taking differently: Take 40 mg by mouth daily.)   pregabalin (LYRICA) 150 MG capsule Take 150 mg by mouth 3 (three) times daily.   rosuvastatin (CRESTOR) 20 MG tablet TAKE 1 TABLET(20 MG) BY MOUTH DAILY   traMADol (ULTRAM) 50 MG tablet Take 2 tablets (100 mg total) by mouth daily.   Turmeric (QC TUMERIC COMPLEX PO) Take 1 capsule by mouth daily.   Vitamin D, Ergocalciferol, (DRISDOL) 1.25 MG (50000 UNIT) CAPS capsule Take 50,000 Units by mouth once a week.   No facility-administered encounter medications on file as of 11/06/2023.     Self-harm Behaviors Risk Assessment Self-harm risk factors:  Depression Patient endorses recent thoughts of harming self:  Denies  [place] Suicide Severity Rating Scale:  Flowsheet Row Integrated Behavioral Health from 08/21/2023 in [facility] (Discharged) from 06/16/2023 in [facility] from 03/26/2023 in [facility] at [facility]  C-SSRS RISK CATEGORY No Risk No Risk No Risk        Danger to Others Risk Assessment Danger to others risk factors:  None Patient endorses recent thoughts of harming others:  Denies    Goals, Interventions and Follow-up Plan Goals: Increase healthy adjustment to current life circumstances Interventions: Supportive Counseling Follow-up Plan:  two weeks    Regina Capes

## 2023-11-07 DIAGNOSIS — I10 Essential (primary) hypertension: Secondary | ICD-10-CM | POA: Diagnosis not present

## 2023-11-07 DIAGNOSIS — M25561 Pain in right knee: Secondary | ICD-10-CM | POA: Diagnosis not present

## 2023-11-07 DIAGNOSIS — G8929 Other chronic pain: Secondary | ICD-10-CM | POA: Diagnosis not present

## 2023-11-07 DIAGNOSIS — M25562 Pain in left knee: Secondary | ICD-10-CM | POA: Diagnosis not present

## 2023-11-07 DIAGNOSIS — M47812 Spondylosis without myelopathy or radiculopathy, cervical region: Secondary | ICD-10-CM | POA: Diagnosis not present

## 2023-11-07 DIAGNOSIS — M1712 Unilateral primary osteoarthritis, left knee: Secondary | ICD-10-CM | POA: Diagnosis not present

## 2023-11-07 DIAGNOSIS — R030 Elevated blood-pressure reading, without diagnosis of hypertension: Secondary | ICD-10-CM | POA: Diagnosis not present

## 2023-11-07 DIAGNOSIS — M542 Cervicalgia: Secondary | ICD-10-CM | POA: Diagnosis not present

## 2023-11-07 DIAGNOSIS — R7303 Prediabetes: Secondary | ICD-10-CM | POA: Diagnosis not present

## 2023-11-07 DIAGNOSIS — Z79899 Other long term (current) drug therapy: Secondary | ICD-10-CM | POA: Diagnosis not present

## 2023-11-16 DIAGNOSIS — Z79899 Other long term (current) drug therapy: Secondary | ICD-10-CM | POA: Diagnosis not present

## 2023-11-23 ENCOUNTER — Ambulatory Visit: Payer: Medicare Other | Admitting: Licensed Clinical Social Worker
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
## 2023-11-23 NOTE — Patient Instructions (Signed)
 Visit Information  Thank you for taking time to visit with me today. Please don't hesitate to contact me if I can be of assistance to you before our next scheduled appointment.  Our next appointment is by telephone on January 05, 2024 at 2:00 PM   Please call the care guide team at [phone] if you need to cancel or reschedule your appointment.   Following is a copy of your care plan:   Goals Addressed             This Visit's Progress    VBCI Social Work Care Plan       Problems:   Financial challenges with co-pays             Occasional transport challenges             Occasional food issues            Pain Issues (Goes to Pain clinic)            Mental Health Issues (Depression and Anxiety)             CSW Clinical Goal(s):   Over the next 30 days the Patient will  attend all scheduled mental health appointments (Virtual or in person) AEB patient report .             Over next 30 days the Patient will continue exercise regime by exercising at least 2 times per week AEB patient report   Interventions:  Discussed medications procurement               Discussed exercise regime               Discussed pain management. Yoana goes to Pain Clinic monthly              Discussed vision of client. Discussed stomach pain issues of client. Tieraney said she would like to talk with Gastroenterologist about stomach pain issues              Discussed caregiver stress issues Havens cares for her cousin who resides with [place])             Discussed transport needs; discussed appetite. Client likes to eat healthy foods and vegetables              Discussed ambulation of client. Client has completed physical therapy sessions             Discussed mental health needs. Client has had 1 virtual visit with Behavioral Health. She has had 1 visit in person with Behavioral Health.  Behavioral Health specialist is making referral for counselor for client . Ireta feels that appointments with Behavioral  health specialist have been very helpful to her                            Patient Goals/Self-Care Activities:  Client will takes medications as prescribed             Client will attend appointments as scheduled with PCP             Client will attend mental health appointments as scheduled.              Client will practice self care (rest, relaxation, eating meals as scheduled)             Client will communicate with LCSW as needed for SW support Plan:   LCSW will call  client on June 10,2025 at 2:00 PM         Please go to [facility] [address], [place] [phone]) if you are experiencing a Mental Health or Behavioral Health Crisis or need someone to talk to.  The patient verbalized understanding of instructions, educational materials, and care plan provided today and DECLINED offer to receive copy of patient instructions, educational materials, and care plan.   Patient received information on how to contact Care Team for support. LCSW gave client LCSW name and phone number and encouraged client to call LCSW as needed for SW support   Alexandria Angel  MSW, LCSW [place]/[facility] Licensed Clinical Social Worker Direct Dial:  [phone] Fax:  [phone] Website:  [email or web address]

## 2023-11-30 ENCOUNTER — Ambulatory Visit: Payer: Self-pay

## 2023-11-30 NOTE — Telephone Encounter (Signed)
  Chief Complaint: abdominal pain Symptoms: abdominal pain, distention, back pain Frequency: the past few days Pertinent Negatives: Patient denies fever, burning with urination, vomiting Disposition: [] ED /[] Urgent Care (no appt availability in office) / [x] Appointment(In office/virtual)/ []  [facility] [facility]/ [] Home Care/ [] Refused Recommended Disposition /[] [place] [facility]/ []  Follow-up with PCP Additional Notes: Patient reports she has been experiencing pain in her lower abdomen. Patient reports this has happened in the past where when she is constipated she feels like her bladder retains urine. Patient reports she is still urinating and denies burning with urination, but states it still feels like her bladder is full. Patient also reports back pain. Per protocol, appt scheduled next available tomorrow 12/01/23. Patient advised to call back with worsening symptoms. Patient verbalized understanding.    Copied from CRM [id number]. Topic: Clinical - Medical Advice >> Nov 30, 2023 12:48 PM Rosaria Common wrote: Reason for CRM: Pain in lower abdomen area. Reason for Disposition  [1] MILD pain (e.g., does not interfere with normal activities) AND [2] pain comes and goes (cramps) AND [3] present > 48 hours  (Exception: This same abdominal pain is a chronic symptom recurrent or ongoing AND present > 4 weeks.)  Answer Assessment - Initial Assessment Questions 1. LOCATION: "Where does it hurt?"      Lower  2. RADIATION: "Does the pain shoot anywhere else?" (e.g., chest, back)     denies 3. ONSET: "When did the pain begin?" (e.g., minutes, hours or days ago)      The last few days 4. SUDDEN: "Gradual or sudden onset?"     gradual 5. PATTERN "Does the pain come and go, or is it constant?"    - If it comes and goes: "How long does it last?" "Do you have pain now?"     (Note: Comes and goes means the pain is intermittent. It goes away completely between bouts.)    - If constant: "Is it getting  better, staying the same, or getting worse?"      (Note: Constant means the pain never goes away completely; most serious pain is constant and gets worse.)      constant 6. SEVERITY: "How bad is the pain?"  (e.g., Scale 1-10; mild, moderate, or severe)    - MILD (1-3): Doesn't interfere with normal activities, abdomen soft and not tender to touch.     - MODERATE (4-7): Interferes with normal activities or awakens from sleep, abdomen tender to touch.     - SEVERE (8-10): Excruciating pain, doubled over, unable to do any normal activities.       mild 7. RECURRENT SYMPTOM: "Have you ever had this type of stomach pain before?" If Yes, ask: "When was the last time?" and "What happened that time?"      yes 8. CAUSE: "What do you think is causing the stomach pain?"     Bladder retaining urine 9. RELIEVING/AGGRAVATING FACTORS: "What makes it better or worse?" (e.g., antacids, bending or twisting motion, bowel movement)     denies 10. OTHER SYMPTOMS: "Do you have any other symptoms?" (e.g., back pain, diarrhea, fever, urination pain, vomiting)       Back pain  Protocols used: Abdominal Pain - Female-A-AH

## 2023-12-01 ENCOUNTER — Ambulatory Visit: Payer: Medicare Other

## 2023-12-01 ENCOUNTER — Ambulatory Visit

## 2023-12-01 VITALS — BP 128/90 | HR 111 | Ht 63.0 in | Wt 209.4 lb

## 2023-12-01 DIAGNOSIS — E890 Postprocedural hypothyroidism: Secondary | ICD-10-CM

## 2023-12-01 DIAGNOSIS — R7989 Other specified abnormal findings of blood chemistry: Secondary | ICD-10-CM | POA: Diagnosis not present

## 2023-12-01 DIAGNOSIS — R8290 Unspecified abnormal findings in urine: Secondary | ICD-10-CM

## 2023-12-01 DIAGNOSIS — I10 Essential (primary) hypertension: Secondary | ICD-10-CM | POA: Diagnosis not present

## 2023-12-01 MED ORDER — OLMESARTAN-AMLODIPINE-HCTZ 40-10-25 MG PO TABS: 1.0000 | ORAL_TABLET | Freq: Every day | ORAL | 1 refills | Status: DC

## 2023-12-01 NOTE — Progress Notes (Unsigned)
   Acute Office Visit  Subjective:     Patient ID: Martha Montgomery, female    DOB: September 05, 1980, 43 y.o.   MRN: [id number]  Chief Complaint  Patient presents with   Medical Management of Chronic Issues    Pt states lower abdominal pain and pain in back, been diagnosed with IBSC, been noticing film little particals in urine, excessive bloating, a lot of mucus in bowel movements and fowl smells like rotting, sometimes green mucus"    HPI Patient is in today for multiple issues  ROS      Objective:    BP (!) 128/90   Pulse (!) 111   Ht 5\' 3"  (1.6 m)   Wt 209 lb 6.4 oz (95 kg)   SpO2 93%   BMI 37.09 kg/m    Physical Exam Vitals and nursing note reviewed.  Constitutional:      Appearance: Normal appearance. She is obese.  HENT:     Head: Normocephalic.     Right Ear: Tympanic membrane, ear canal and external ear normal.     Left Ear: Tympanic membrane, ear canal and external ear normal.     Nose: Nose normal.     Mouth/Throat:     Mouth: Mucous membranes are moist.     Pharynx: Oropharynx is clear.  Cardiovascular:     Rate and Rhythm: Normal rate and regular rhythm.  Pulmonary:     Effort: Pulmonary effort is normal.     Breath sounds: Normal breath sounds.  Abdominal:     General: Abdomen is flat. Bowel sounds are normal.  Musculoskeletal:     Cervical back: Normal range of motion and neck supple.  Skin:    General: Skin is warm and dry.  Neurological:     Mental Status: She is alert and oriented to person, place, and time.  Psychiatric:        Mood and Affect: Mood normal.        Thought Content: Thought content normal.         Assessment & Plan:   Problem List Items Addressed This Visit       Cardiovascular and Mediastinum   Hypertension   Fair control.  Continue with current medication.   DASH diet and commitment to daily physical activity for a minimum of 30 minutes discussed and encouraged, as a part of hypertension management. The importance of  attaining a healthy weight is also discussed.      Relevant Medications   Olmesartan-amLODIPine -HCTZ 40-10-25 MG TABS     Endocrine   Post-surgical hypothyroidism   Recheck thyroid  labs today.  F/U according to lab results.       Relevant Orders   TSH + free T4 (Completed)     Other   Urine abnormality - Primary   Check urine culture for further evaluation.       Relevant Orders   Urine Culture   Elevated platelet count   Recheck CBC to reevaluate      Relevant Orders   CBC with Differential/Platelet (Completed)    Meds ordered this encounter  Medications   Olmesartan-amLODIPine -HCTZ 40-10-25 MG TABS    Sig: Take 1 tablet by mouth daily.    Dispense:  90 tablet    Refill:  1    No follow-ups on file.  Alison Irvine, FNP

## 2023-12-02 DIAGNOSIS — R7989 Other specified abnormal findings of blood chemistry: Secondary | ICD-10-CM | POA: Insufficient documentation

## 2023-12-02 DIAGNOSIS — D75839 Thrombocytosis, unspecified: Secondary | ICD-10-CM | POA: Insufficient documentation

## 2023-12-02 DIAGNOSIS — R8290 Unspecified abnormal findings in urine: Secondary | ICD-10-CM | POA: Insufficient documentation

## 2023-12-02 LAB — CBC WITH DIFFERENTIAL/PLATELET
Basophils Absolute: 0.1 10*3/uL (ref 0.0–0.2)
Basos: 1 %
EOS (ABSOLUTE): 0 10*3/uL (ref 0.0–0.4)
Eos: 1 %
Hematocrit: 39.5 % (ref 34.0–46.6)
Hemoglobin: 13.1 g/dL (ref 11.1–15.9)
Immature Grans (Abs): 0 10*3/uL (ref 0.0–0.1)
Immature Granulocytes: 1 %
Lymphocytes Absolute: 2.1 10*3/uL (ref 0.7–3.1)
Lymphs: 37 %
MCH: 31.1 pg (ref 26.6–33.0)
MCHC: 33.2 g/dL (ref 31.5–35.7)
MCV: 94 fL (ref 79–97)
Monocytes Absolute: 0.4 10*3/uL (ref 0.1–0.9)
Monocytes: 7 %
Neutrophils Absolute: 3 10*3/uL (ref 1.4–7.0)
Neutrophils: 53 %
Platelets: 382 10*3/uL (ref 150–450)
RBC: 4.21 x10E6/uL (ref 3.77–5.28)
RDW: 13.6 % (ref 11.7–15.4)
WBC: 5.6 10*3/uL (ref 3.4–10.8)

## 2023-12-02 LAB — TSH+FREE T4
Free T4: 1.34 ng/dL (ref 0.82–1.77)
TSH: 0.131 u[IU]/mL — ABNORMAL LOW (ref 0.450–4.500)

## 2023-12-02 NOTE — Assessment & Plan Note (Signed)
 Fair control.  Continue with current medication.   DASH diet and commitment to daily physical activity for a minimum of 30 minutes discussed and encouraged, as a part of hypertension management. The importance of attaining a healthy weight is also discussed.

## 2023-12-02 NOTE — Assessment & Plan Note (Signed)
 Recheck thyroid  labs today.  F/U according to lab results.

## 2023-12-02 NOTE — Assessment & Plan Note (Signed)
 Recheck CBC to reevaluate

## 2023-12-02 NOTE — Assessment & Plan Note (Signed)
 Check urine culture for further evaluation.

## 2023-12-03 LAB — URINE CULTURE: Organism ID, Bacteria: NO GROWTH

## 2023-12-07 DIAGNOSIS — M1712 Unilateral primary osteoarthritis, left knee: Secondary | ICD-10-CM | POA: Diagnosis not present

## 2023-12-07 DIAGNOSIS — Z79899 Other long term (current) drug therapy: Secondary | ICD-10-CM | POA: Diagnosis not present

## 2023-12-07 DIAGNOSIS — M542 Cervicalgia: Secondary | ICD-10-CM | POA: Diagnosis not present

## 2023-12-07 DIAGNOSIS — Z0000 Encounter for general adult medical examination without abnormal findings: Secondary | ICD-10-CM | POA: Diagnosis not present

## 2023-12-07 DIAGNOSIS — R0602 Shortness of breath: Secondary | ICD-10-CM | POA: Diagnosis not present

## 2023-12-07 DIAGNOSIS — I10 Essential (primary) hypertension: Secondary | ICD-10-CM | POA: Diagnosis not present

## 2023-12-07 DIAGNOSIS — R7303 Prediabetes: Secondary | ICD-10-CM | POA: Diagnosis not present

## 2023-12-07 DIAGNOSIS — R1084 Generalized abdominal pain: Secondary | ICD-10-CM | POA: Diagnosis not present

## 2023-12-10 ENCOUNTER — Ambulatory Visit: Payer: Self-pay

## 2023-12-10 ENCOUNTER — Other Ambulatory Visit: Payer: Self-pay

## 2023-12-10 DIAGNOSIS — E890 Postprocedural hypothyroidism: Secondary | ICD-10-CM

## 2023-12-10 MED ORDER — LEVOTHYROXINE SODIUM 175 MCG PO TABS: 175.0000 ug | ORAL_TABLET | Freq: Every day | ORAL | 3 refills | Status: DC

## 2023-12-14 DIAGNOSIS — Z79899 Other long term (current) drug therapy: Secondary | ICD-10-CM | POA: Diagnosis not present

## 2023-12-23 ENCOUNTER — Inpatient Hospital Stay (HOSPITAL_COMMUNITY): Admission: RE | Admit: 2023-12-23 | Source: Ambulatory Visit

## 2023-12-25 ENCOUNTER — Other Ambulatory Visit: Payer: Self-pay | Admitting: Family Medicine

## 2023-12-30 ENCOUNTER — Ambulatory Visit (HOSPITAL_COMMUNITY)
Admission: RE | Admit: 2023-12-30 | Discharge: 2023-12-30 | Disposition: A | Discharge status: Home / Self Care | Source: Ambulatory Visit | Attending: Family Medicine | Admitting: Family Medicine

## 2023-12-30 DIAGNOSIS — Z1231 Encounter for screening mammogram for malignant neoplasm of breast: Secondary | ICD-10-CM

## 2024-01-05 ENCOUNTER — Telehealth: Payer: Self-pay | Admitting: Licensed Clinical Social Worker

## 2024-01-05 ENCOUNTER — Encounter: Payer: Self-pay | Admitting: Licensed Clinical Social Worker

## 2024-01-05 DIAGNOSIS — M25562 Pain in left knee: Secondary | ICD-10-CM | POA: Diagnosis not present

## 2024-01-05 DIAGNOSIS — M25561 Pain in right knee: Secondary | ICD-10-CM | POA: Diagnosis not present

## 2024-01-05 DIAGNOSIS — I10 Essential (primary) hypertension: Secondary | ICD-10-CM | POA: Diagnosis not present

## 2024-01-05 DIAGNOSIS — M542 Cervicalgia: Secondary | ICD-10-CM | POA: Diagnosis not present

## 2024-01-05 DIAGNOSIS — G8929 Other chronic pain: Secondary | ICD-10-CM | POA: Diagnosis not present

## 2024-01-05 DIAGNOSIS — M47812 Spondylosis without myelopathy or radiculopathy, cervical region: Secondary | ICD-10-CM | POA: Diagnosis not present

## 2024-01-05 DIAGNOSIS — R030 Elevated blood-pressure reading, without diagnosis of hypertension: Secondary | ICD-10-CM | POA: Diagnosis not present

## 2024-01-05 DIAGNOSIS — R7303 Prediabetes: Secondary | ICD-10-CM | POA: Diagnosis not present

## 2024-01-05 DIAGNOSIS — M1712 Unilateral primary osteoarthritis, left knee: Secondary | ICD-10-CM | POA: Diagnosis not present

## 2024-01-05 DIAGNOSIS — Z79899 Other long term (current) drug therapy: Secondary | ICD-10-CM | POA: Diagnosis not present

## 2024-02-08 ENCOUNTER — Other Ambulatory Visit: Payer: Self-pay | Admitting: Licensed Clinical Social Worker

## 2024-02-08 ENCOUNTER — Other Ambulatory Visit: Payer: Self-pay
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
## 2024-02-08 NOTE — Patient Instructions (Signed)
 Visit Information  Thank you for taking time to visit with me today. Please don't hesitate to contact me if I can be of assistance to you before our next scheduled appointment.  Our next appointment is by telephone on 03/21/24 at 3:00 PM   Please call the care guide team at [phone] if you need to cancel or reschedule your appointment.   Following is a copy of your care plan:   Goals Addressed             This Visit's Progress    VBCI Social Work Care Plan       Problems:   Financial challenges with co-pays             Occasional transport challenges             Occasional food issues            Pain Issues (Goes to Pain clinic)            Mental Health Issues (Depression and Anxiety)             CSW Clinical Goal(s):   Over the next 30 days the Patient will attend all scheduled mental health appointments (Virtual or in person) AEB patient report.             Over next 30 days the Patient will continue exercise regime by exercising at least 2 times per week AEB patient report   Interventions:  Discussed medications procurement               Discussed exercise regime               Discussed pain management. Patte goes to Pain Clinic monthly              Discussed vision of client. Discussed stomach pain issues of client. Ireene said she would like to talk with Gastroenterologist about stomach pain issues              Discussed caregiver stress issues Bonanno cares for her cousin who resides with [place])             Discussed transport needs; discussed appetite. Client likes to eat healthy foods and vegetables              Discussed ambulation of client. Client has completed physical therapy sessions. She said she walks slowly and takes her time when walking             Discussed mental health needs. Client has had 1 virtual visit with Behavioral Health. She has had 1 visit in person with Behavioral Health.  Behavioral Health specialist is making referral for counselor for  client . Diahann feels that appointments with Behavioral health specialist have been very helpful to her. Danita plans to call Behavioral Health agency to inquire about future counseling support services available           Encouraged client to call nurse at PCP office to talk with nurse about client referrals that had been placed           Provided counseling support for client                             Patient Goals/Self-Care Activities:  Client will take medications as prescribed             Client will attend appointments as scheduled with PCP  Client will attend mental health appointments as scheduled.              Client will practice self care (rest, relaxation, eating meals as scheduled)             Client will communicate with LCSW as needed for SW support Plan:   LCSW will call client on March 21, 2024 at 3:00 PM         Please call the [facility]: [phone] if you are experiencing a Mental Health or Behavioral Health Crisis or need someone to talk to.  The patient verbalized understanding of instructions, educational materials, and care plan provided today and DECLINED offer to receive copy of patient instructions, educational materials, and care plan.    [name]  MSW, LCSW [facility] Licensed Clinical Social Worker Direct Dial:  [phone] Fax:  [phone] Website:  [email or web address]

## 2024-03-01 ENCOUNTER — Ambulatory Visit

## 2024-03-01 VITALS — Ht 63.0 in | Wt 209.0 lb

## 2024-03-01 DIAGNOSIS — Z0000 Encounter for general adult medical examination without abnormal findings: Secondary | ICD-10-CM | POA: Diagnosis not present

## 2024-03-01 NOTE — Progress Notes (Signed)
 Subjective:   Martha Montgomery is a 43 y.o. who presents for a Medicare Wellness preventive visit.  As a reminder, Annual Wellness Visits don't include a physical exam, and some assessments may be limited, especially if this visit is performed virtually. We may recommend an in-person follow-up visit with your provider if needed.  Visit Complete: Virtual I connected with  Raul R Swickard on 03/01/24 by a audio enabled telemedicine application and verified that I am speaking with the correct person using two identifiers.  Patient Location: Home  Provider Location: Home Office  I discussed the limitations of evaluation and management by telemedicine. The patient expressed understanding and agreed to proceed.  Vital Signs: Because this visit was a virtual/telehealth visit, some criteria may be missing or patient reported. Any vitals not documented were not able to be obtained and vitals that have been documented are patient reported.  VideoDeclined- This patient declined Librarian, academic. Therefore the visit was completed with audio only.  Persons Participating in Visit: Patient.  AWV Questionnaire: No: Patient Medicare AWV questionnaire was not completed prior to this visit.  Cardiac Risk Factors include: advanced age (>33men, >42 women);obesity (BMI >30kg/m2);dyslipidemia;hypertension     Objective:    Today's Vitals   03/01/24 1422  Weight: 209 lb (94.8 kg)  Height: 5' 3 (1.6 m)   Body mass index is 37.02 kg/m.     03/01/2024    2:13 PM 06/30/2023    2:43 PM 06/09/2023    1:31 PM 03/26/2023    2:07 PM 01/14/2023   12:27 PM 11/24/2022    4:18 PM 01/12/2022   12:46 AM  Advanced Directives  Does Patient Have a Medical Advance Directive? No No No No No Yes No  Does patient want to make changes to medical advance directive?      Yes (ED - Information included in AVS)   Would patient like information on creating a medical advance directive? No - Patient  declined No - Patient declined No - Patient declined    No - Patient declined    Current Medications (verified) Outpatient Encounter Medications as of 03/01/2024  Medication Sig   busPIRone  (BUSPAR ) 5 MG tablet TAKE 1 TABLET(5 MG) BY MOUTH THREE TIMES DAILY   Cyanocobalamin  (B-12 PO) Take 1 tablet by mouth daily.   diphenhydrAMINE  (BENADRYL ) 25 MG tablet Take 25 mg by mouth daily.   EPINEPHrine  0.3 mg/0.3 mL IJ SOAJ injection Inject 0.3 mg into the muscle as needed for anaphylaxis.   escitalopram  (LEXAPRO ) 20 MG tablet Take 1 tablet (20 mg total) by mouth daily.   fluticasone  (FLONASE ) 50 MCG/ACT nasal spray Place 2 sprays into both nostrils daily.   Ginger, Zingiber officinalis, (GINGER PO) Take 1 capsule by mouth daily.   levothyroxine  (SYNTHROID ) 175 MCG tablet Take 1 tablet (175 mcg total) by mouth daily.   medroxyPROGESTERone  (DEPO-PROVERA ) 150 MG/ML injection Inject 150 mg into the muscle every 3 (three) months.   Olmesartan -amLODIPine -HCTZ 40-10-25 MG TABS Take 1 tablet by mouth daily.   pantoprazole  (PROTONIX ) 40 MG tablet Take 1 tablet (40 mg total) by mouth 2 (two) times daily before a meal. (Patient taking differently: Take 40 mg by mouth daily.)   pregabalin (LYRICA) 150 MG capsule Take 150 mg by mouth 3 (three) times daily.   rosuvastatin  (CRESTOR ) 20 MG tablet TAKE 1 TABLET(20 MG) BY MOUTH DAILY   traMADol  (ULTRAM ) 50 MG tablet Take 2 tablets (100 mg total) by mouth daily.   Vitamin D , Ergocalciferol , (  DRISDOL ) 1.25 MG (50000 UNIT) CAPS capsule Take 50,000 Units by mouth once a week.   morphine  (MSIR) 15 MG tablet Take 15 mg by mouth 3 (three) times daily as needed. (Patient not taking: Reported on 03/01/2024)   Turmeric (QC TUMERIC COMPLEX PO) Take 1 capsule by mouth daily. (Patient not taking: Reported on 03/01/2024)   No facility-administered encounter medications on file as of 03/01/2024.    Allergies (verified) Aspirin , Codeine, Diclofenac , Ibuprofen , Metronidazole , Nsaids, and  Soybean-containing drug products   History: Past Medical History:  Diagnosis Date   Allergy 2010   Seasonal   Anxiety    Anxiety disorder    Arthritis    knees, joints, hands, toes (09/28/2013)   Depression    Depression, major, single episode, in partial remission (HCC) 10/18/2008   Qualifier: Diagnosis of  By: Antonetta MD, Margaret  PHQ 9 score of 7 in 10/2017, not suicidal or homicidal, interested in telepsych however, score of 16 in  02/2018, not suicidal or homicidal, start medication and refer to telepsych Score of 7 in 07/2018    Fibromyalgia    Gastritis 05/2010   EGD by Dr. Harvey, negative h pylori   GERD (gastroesophageal reflux disease)    Gout    Hypertension x 6 years    Hypothyroidism    Migraines    15/month average (09/28/2013)   Obesity (BMI 30.0-34.9)    Spinal stenosis    Thyroid  goiter    Past Surgical History:  Procedure Laterality Date   BIOPSY  10/20/2017   Procedure: BIOPSY;  Surgeon: Harvey Margo CROME, MD;  [facility];  Service: Endoscopy;;  gastric duodenal esophagus   CESAREAN SECTION  2008   CHONDROPLASTY  04/02/2012   Procedure: CHONDROPLASTY;  Surgeon: Taft FORBES Minerva, MD;  Location: [facility];  Service: Orthopedics;  Laterality: Left;  of left patella   COLONOSCOPY  11/2010   Dr. Harvey: normal colon, small internal hemorrhoids   COLONOSCOPY WITH PROPOFOL  N/A 10/22/2017   Surgeon: Harvey Margo CROME, MD; significant looping of the colon, internal hemorrhoids, otherwise normal exam.  Recommended 10-year surveillance.   COLONOSCOPY WITH PROPOFOL  N/A 10/20/2017   Procedure: COLONOSCOPY WITH PROPOFOL ;  Surgeon: Harvey Margo CROME, MD;  [facility];  Service: Endoscopy;  Laterality: N/A;  11:00am   ESOPHAGOGASTRODUODENOSCOPY (EGD) WITH PROPOFOL  N/A 10/20/2017   Surgeon: Harvey Margo CROME, MD; no endoscopic abnormality to explain dysphagia s/p empiric dilation, gastritis.  Esophageal biopsies consistent with reflux, gastric biopsies  with gastric antral and oxyntic mucosa negative for H. pylori, duodenal biopsies benign.   INCISION AND DRAINAGE ABSCESS  1997; 2005   under right buttocks; under left arm   KNEE ARTHROSCOPY Left 2021   KNEE ARTHROSCOPY WITH MEDIAL PATELLAR FEMORAL LIGAMENT RECONSTRUCTION Right 06/16/2023   Procedure: RIGHT KNEE ARTHROSCOPY WITH MEDIAL PATELLAR FEMORAL LIGAMENT RECONSTRUCTION;  Surgeon: Genelle Standing, MD;  Location: [facility] OR;  Service: Orthopedics;  Laterality: Right;   SAVORY DILATION N/A 10/20/2017   Procedure: SAVORY DILATION;  Surgeon: Harvey Margo CROME, MD;  [facility];  Service: Endoscopy;  Laterality: N/A;   THYROIDECTOMY N/A 09/28/2013   Procedure: TOTAL THYROIDECTOMY;  Surgeon: Ana LELON Moccasin, MD;  [facility];  Service: ENT;  Laterality: N/A;   TIBIA OSTEOTOMY Right 06/16/2023   Procedure: RIGHT KNEE TIBIAL TUBERLE OSTEOTOMY;  Surgeon: Genelle Standing, MD;  Location: [facility] OR;  Service: Orthopedics;  Laterality: Right;   UPPER GI ENDOSCOPY  2011   EGD: gastritis, no h.pylori  Family History  Problem Relation Age of Onset   Arthritis Mother    Migraines Mother    Hypertension Mother    Cancer Mother    Depression Mother    Diabetes Mother    Heart disease Mother    Cancer Maternal Grandmother        lung   Heart disease Maternal Grandmother    Hypertension Maternal Grandmother    Stroke Maternal Grandmother    Asthma Other        family history    Drug abuse Father    Depression Maternal Grandfather    Colon cancer Neg Hx    Social History   Socioeconomic History   Marital status: Legally Separated    Spouse name: Not on file   Number of children: 1   Years of education: 12   Highest education level: 12th grade  Occupational History   Occupation: Banker of Human resources officer: UNEMPLOYED  Tobacco Use   Smoking status: Former    Current packs/day: 0.00    Average packs/day: 1 pack/day for 5.0 years (5.0 ttl  pk-yrs)    Types: Cigarettes    Start date: 09/11/2005    Quit date: 09/11/2010    Years since quitting: 13.4   Smokeless tobacco: Never   Tobacco comments:    QUIT SMOKING X 3 YEARS AGO,was vaping but has since quit completely  Vaping Use   Vaping status: Former   Quit date: 04/28/2023  Substance and Sexual Activity   Alcohol use: No   Drug use: No   Sexual activity: Not Currently    Partners: Male    Birth control/protection: Other-see comments, Injection    Comment: Depo  Other Topics Concern   Not on file  Social History Narrative   Pt is currently on medicaid. From [place] and came to [facility] in mid 2010   Is separated from Husband, lives with Mother right now    Social Drivers of Health   Financial Resource Strain: Medium Risk (03/01/2024)   Overall Financial Resource Strain (CARDIA)    Difficulty of Paying Living Expenses: Somewhat hard  Food Insecurity: No Food Insecurity (03/01/2024)   Hunger Vital Sign    Worried About Running Out of Food in the Last Year: Never true    Ran Out of Food in the Last Year: Never true  Recent Concern: Food Insecurity - Food Insecurity Present (02/08/2024)   Hunger Vital Sign    Worried About Running Out of Food in the Last Year: Sometimes true    Ran Out of Food in the Last Year: Sometimes true  Transportation Needs: No Transportation Needs (03/01/2024)   PRAPARE - Administrator, Civil Service (Medical): No    Lack of Transportation (Non-Medical): No  Physical Activity: Insufficiently Active (03/01/2024)   Exercise Vital Sign    Days of Exercise per Week: 7 days    Minutes of Exercise per Session: 20 min  Stress: No Stress Concern Present (03/01/2024)   [employer] of Occupational Health - Occupational Stress Questionnaire    Feeling of Stress: Not at all  Recent Concern: Stress - Stress Concern Present (02/08/2024)   [employer] of Occupational Health - Occupational Stress Questionnaire    Feeling of Stress: To some extent   Social Connections: Unknown (03/01/2024)   Social Connection and Isolation Panel    Frequency of Communication with Friends and Family: More than three times a week    Frequency of Social  Gatherings with Friends and Family: More than three times a week    Attends Religious Services: More than 4 times per year    Active Member of Clubs or Organizations: Yes    Attends Engineer, structural: More than 4 times per year    Marital Status: Patient declined    Tobacco Counseling Counseling given: Yes Tobacco comments: QUIT SMOKING X 3 YEARS AGO,was vaping but has since quit completely    Clinical Intake:  Pre-visit preparation completed: Yes  Pain : No/denies pain     BMI - recorded: 37.02 Nutritional Status: BMI > 30  Obese Nutritional Risks: None Diabetes: No  Lab Results  Component Value Date   HGBA1C 5.4 06/06/2022   HGBA1C 5.6 02/15/2021   HGBA1C 5.5 11/16/2019     How often do you need to have someone help you when you read instructions, pamphlets, or other written materials from your doctor or pharmacy?: 1 - Never  Interpreter Needed?: No  Information entered by :: Stefano ORN [facility]   Activities of Daily Living     03/01/2024    3:03 PM 06/09/2023    1:34 PM  In your present state of health, do you have any difficulty performing the following activities:  Hearing? 0   Vision? 0   Difficulty concentrating or making decisions? 0   Walking or climbing stairs? 1   Dressing or bathing? 0   Doing errands, shopping? 0 0  Preparing Food and eating ? N   Using the Toilet? N   In the past six months, have you accidently leaked urine? N   Do you have problems with loss of bowel control? N   Managing your Medications? N   Managing your Finances? N   Housekeeping or managing your Housekeeping? N     Patient Care Team: [name], MD as PCP - General Jacinto, Lonni PARAS, [facility] (Inactive) (Pharmacist) Frances Ozell RAMAN, LCSW as [facility] Care Management (Licensed Clinical Social Worker) Frances, Ozell RAMAN HUGHS as VBCI Care Management (Licensed Clinical Social Worker)  I have updated your Care Teams any recent Medical Services you may have received from other providers in the past year.     Assessment:   This is a routine wellness examination for Banks.  Hearing/Vision screen Hearing Screening - Comments:: Patient denies any hearing difficulties.   Vision Screening - Comments:: Patient is not up to date on yearly eye exams.  List of eye doctors that she can call.    Goals Addressed             This Visit's Progress    Patient Stated       I'd like to have more mobility.        Depression Screen     03/01/2024    3:04 PM 02/08/2024   11:52 AM 02/08/2024   11:42 AM 12/01/2023   10:26 AM 11/23/2023    9:49 AM 09/15/2023    1:38 PM 08/21/2023    2:13 PM  PHQ 2/9 Scores  PHQ - 2 Score 0 2 2 2 2 2 2   PHQ- 9 Score 0 9 9 13 8 7 9     Fall Risk     03/01/2024    2:36 PM 12/01/2023   10:25 AM 07/30/2023    4:04 PM 04/16/2023    4:14 PM 01/22/2023    3:30 PM  Fall Risk   Falls in the past year? 0 0 0 0 0  Number  falls in past yr: 0 0 0 0 0  Injury with Fall? 0 0 0 0 0  Risk for fall due to : No Fall Risks No Fall Risks No Fall Risks No Fall Risks No Fall Risks  Follow up Falls evaluation completed;Education provided;Falls prevention discussed Falls evaluation completed Falls evaluation completed Falls evaluation completed Falls evaluation completed    MEDICARE RISK AT HOME:  Medicare Risk at Home Any stairs in or around the home?: Yes If so, are there any without handrails?: No Home free of loose throw rugs in walkways, pet beds, electrical cords, etc?: Yes Adequate lighting in your home to reduce risk of falls?: Yes Life alert?: No Use of a cane, walker or w/c?: No Grab bars in the bathroom?: Yes Shower chair or bench in shower?: Yes Elevated toilet seat or a handicapped toilet?: Yes  TIMED UP AND  GO:  Was the test performed?  No  Cognitive Function: 6CIT completed        03/01/2024    3:03 PM 11/24/2022    4:20 PM 10/28/2021    4:36 PM 08/30/2020    4:05 PM 08/30/2019    1:14 PM  6CIT Screen  What Year? 0 points 0 points 0 points 0 points 0 points  What month? 0 points 0 points 0 points 0 points 0 points  What time? 0 points 0 points 0 points 0 points 0 points  Count back from 20 0 points 0 points 0 points 0 points 0 points  Months in reverse 0 points 0 points 0 points 0 points 0 points  Repeat phrase 0 points 0 points 0 points 2 points 0 points  Total Score 0 points 0 points 0 points 2 points 0 points    Immunizations Immunization History  Administered Date(s) Administered   Hpv-Unspecified 10/18/2008, 03/07/2009   Influenza Split 03/26/2016   Influenza Whole 06/07/2009, 03/28/2010   Influenza, Seasonal, Injecte, Preservative Fre 04/16/2023   Influenza,inj,Quad PF,6+ Mos 05/30/2014, 06/28/2015, 06/22/2017, 03/16/2018, 04/20/2019, 03/27/2020, 09/03/2021, 03/13/2022   Influenza-Unspecified 03/28/2013   PFIZER(Purple Top)SARS-COV-2 Vaccination 02/07/2020, 03/05/2020   Td 03/07/2009    Screening Tests Health Maintenance  Topic Date Due   Hepatitis B Vaccines (1 of 3 - 19+ 3-dose series) Never done   HPV VACCINES (3 - Risk 3-dose series) 07/07/2009   DTaP/Tdap/Td (2 - Tdap) 03/08/2019   COVID-19 Vaccine (3 - Pfizer risk series) 04/02/2020   Medicare Annual Wellness (AWV)  11/24/2023   INFLUENZA VACCINE  02/26/2024   MAMMOGRAM  12/29/2024   Cervical Cancer Screening (HPV/Pap Cotest)  08/08/2027   Hepatitis C Screening  Completed   HIV Screening  Completed   Meningococcal B Vaccine  Aged Out    Health Maintenance  Health Maintenance Due  Topic Date Due   Hepatitis B Vaccines (1 of 3 - 19+ 3-dose series) Never done   HPV VACCINES (3 - Risk 3-dose series) 07/07/2009   DTaP/Tdap/Td (2 - Tdap) 03/08/2019   COVID-19 Vaccine (3 - Pfizer risk series) 04/02/2020    Medicare Annual Wellness (AWV)  11/24/2023   INFLUENZA VACCINE  02/26/2024   Health Maintenance Items Addressed: Routine health screenings are up to date. Patient is aware of recommended vaccines.   Additional Screening:  Vision Screening: Recommended annual ophthalmology exams for early detection of glaucoma and other disorders of the eye. Would you like a referral to an eye doctor? Patient provided with a list of ophthalmologists to call and get an appt with   Dental Screening: Recommended annual  dental exams for proper oral hygiene  Community Resource Referral / Chronic Care Management: CRR required this visit?  No   CCM required this visit?  No   Plan:    I have personally reviewed and noted the following in the patient's chart:   Medical and social history Use of alcohol, tobacco or illicit drugs  Current medications and supplements including opioid prescriptions. Patient is currently taking opioid prescriptions. Information provided to patient regarding non-opioid alternatives. Patient advised to discuss non-opioid treatment plan with their provider. Functional ability and status Nutritional status Physical activity Advanced directives List of other physicians Hospitalizations, surgeries, and ER visits in previous 12 months Vitals Screenings to include cognitive, depression, and falls Referrals and appointments  In addition, I have reviewed and discussed with patient certain preventive protocols, quality metrics, and best practice recommendations. A written personalized care plan for preventive services as well as general preventive health recommendations were provided to patient.   Noheli Melder, CMA   03/01/2024   After Visit Summary: (MyChart) Due to this being a telephonic visit, the after visit summary with patients personalized plan was offered to patient via MyChart   Notes: Nothing significant to report at this time.

## 2024-03-01 NOTE — Patient Instructions (Signed)
 Ms. Dethlefs , Thank you for taking time out of your busy schedule to complete your Annual Wellness Visit with me. I enjoyed our conversation and look forward to speaking with you again next year. I, as well as your care team,  appreciate your ongoing commitment to your health goals. Please review the following plan we discussed and let me know if I can assist you in the future. Your Game plan/ To Do List    Referrals: If you haven't heard from the office you've been referred to, please reach out to them at the phone provided.  There are several Eye Doctors in your area. Here are a few that usually accept all insurance types:  [facility] [address] [place], [place] [address] Phone: [phone]  [facility] [address] [place], [place] [address] Phone: [phone]  [facility] [address] [place], [place] [address] Phone: [phone]  [facility] [address] [place] [facility] [place], [place] [address] Phone: [phone]  [facility] [address] [place], [place] [address] Phone: [phone]  Please let us  know if you require a referral for an eye exam appointment. Thank you!  Follow up Visits: We will see or speak with you next year for your Next Medicare AWV with our clinical staff  Clinician Recommendations:  Aim for 30 minutes of exercise or brisk walking, 6-8 glasses of water, and 5 servings of fruits and vegetables each day.    Wishing you many blessings and good health during the next year until our next visit.  -Chazz Philson   This is a list of the screenings recommended for you:  Health Maintenance  Topic Date Due   Hepatitis B Vaccine (1 of 3 - 19+ 3-dose series) Never done   HPV Vaccine (3 - Risk 3-dose series) 07/07/2009   DTaP/Tdap/Td vaccine (2 - Tdap) 03/08/2019   COVID-19 Vaccine (3 - Pfizer risk series) 04/02/2020   Medicare Annual Wellness Visit  11/24/2023   Flu Shot  02/26/2024   Mammogram  12/29/2024   Pap  with HPV screening  08/08/2027   Hepatitis C Screening  Completed   HIV Screening  Completed   Meningitis B Vaccine  Aged Out    Advanced directives: (Declined) Advance directive discussed with you today. Even though you declined this today, please call our office should you change your mind, and we can give you the proper paperwork for you to fill out. Advance Care Planning is important because it:  [x]  Makes sure you receive the medical care that is consistent with your values, goals, and preferences  [x]  It provides guidance to your family and loved ones and reduces their decisional burden about whether or not they are making the right decisions based on your wishes.  Follow the link provided in your after visit summary or read over the paperwork we have mailed to you to help you started getting your Advance Directives in place. If you need assistance in completing these, please reach out to us  so that we can help you!  See attachments for Preventive Care and Fall Prevention Tips.

## 2024-03-11 ENCOUNTER — Encounter: Payer: Self-pay | Admitting: Family Medicine

## 2024-03-11 ENCOUNTER — Ambulatory Visit (INDEPENDENT_AMBULATORY_CARE_PROVIDER_SITE_OTHER): Payer: Self-pay | Admitting: Family Medicine

## 2024-03-11 VITALS — BP 114/76 | HR 106 | Resp 16 | Ht 63.0 in | Wt 210.0 lb

## 2024-03-11 DIAGNOSIS — H547 Unspecified visual loss: Secondary | ICD-10-CM | POA: Diagnosis not present

## 2024-03-11 DIAGNOSIS — Z131 Encounter for screening for diabetes mellitus: Secondary | ICD-10-CM

## 2024-03-11 DIAGNOSIS — F411 Generalized anxiety disorder: Secondary | ICD-10-CM

## 2024-03-11 DIAGNOSIS — G894 Chronic pain syndrome: Secondary | ICD-10-CM

## 2024-03-11 DIAGNOSIS — E7849 Other hyperlipidemia: Secondary | ICD-10-CM

## 2024-03-11 DIAGNOSIS — Z23 Encounter for immunization: Secondary | ICD-10-CM

## 2024-03-11 DIAGNOSIS — I10 Essential (primary) hypertension: Secondary | ICD-10-CM

## 2024-03-11 DIAGNOSIS — R7989 Other specified abnormal findings of blood chemistry: Secondary | ICD-10-CM

## 2024-03-11 DIAGNOSIS — F320 Major depressive disorder, single episode, mild: Secondary | ICD-10-CM

## 2024-03-11 MED ORDER — BUSPIRONE HCL 10 MG PO TABS: 10.0000 mg | ORAL_TABLET | Freq: Three times a day (TID) | ORAL | 3 refills | Status: DC

## 2024-03-11 NOTE — Patient Instructions (Addendum)
 F/U in January  Nurse visits to be arranged for Hep B series  Hep B#1 and HPV #3 today  You have been referred out to alternate therapist, I will again message you the name and put in the referral  Dose inc in buspar  10 mg three times daily  You are referred to ophthalmology in [place], [address]   2 weeks only of current pain medicartion will be sent in for you , you need to work with new pain clinic to get medications that you need, pls send a msg re name and add of new pain clinic  Labs today after 1, cbc, lipid, cmp and EGFr, tSH, HBa1C and vit D  It is important that you exercise regularly at least 30 minutes 5 times a week. If you develop chest pain, have severe difficulty breathing, or feel very tired, stop exercising immediately and seek medical attention  Think about what you will eat, plan ahead. Choose  clean, green, fresh or frozen over canned, processed or packaged foods which are more sugary, salty and fatty. 70 to 75% of food eaten should be vegetables and fruit. Three meals at set times with snacks allowed between meals, but they must be fruit or vegetables. Aim to eat over a 12 hour period , example 7 am to 7 pm, and STOP after  your last meal of the day. Drink water,generally about 64 ounces per day, no other drink is as healthy. Fruit juice is best enjoyed in a healthy way, by EATING the fruit. Thanks for choosing [facility], we consider it a privelige to serve you.

## 2024-03-12 LAB — CMP14+EGFR
ALT: 13 IU/L (ref 0–32)
AST: 18 IU/L (ref 0–40)
Albumin: 4.1 g/dL (ref 3.9–4.9)
Alkaline Phosphatase: 78 IU/L (ref 44–121)
BUN/Creatinine Ratio: 16 (ref 9–23)
BUN: 13 mg/dL (ref 6–24)
Bilirubin Total: 0.2 mg/dL (ref 0.0–1.2)
CO2: 19 mmol/L — ABNORMAL LOW (ref 20–29)
Calcium: 9.2 mg/dL (ref 8.7–10.2)
Chloride: 104 mmol/L (ref 96–106)
Creatinine, Ser: 0.82 mg/dL (ref 0.57–1.00)
Globulin, Total: 2.6 g/dL (ref 1.5–4.5)
Glucose: 88 mg/dL (ref 70–99)
Potassium: 3.8 mmol/L (ref 3.5–5.2)
Sodium: 139 mmol/L (ref 134–144)
Total Protein: 6.7 g/dL (ref 6.0–8.5)
eGFR: 92 mL/min/1.73 (ref >59)

## 2024-03-12 LAB — CBC WITH DIFFERENTIAL/PLATELET
Basophils Absolute: 0.1 x10E3/uL (ref 0.0–0.2)
Basos: 1 %
EOS (ABSOLUTE): 0 x10E3/uL (ref 0.0–0.4)
Eos: 0 %
Hematocrit: 39.9 % (ref 34.0–46.6)
Hemoglobin: 13 g/dL (ref 11.1–15.9)
Immature Grans (Abs): 0 x10E3/uL (ref 0.0–0.1)
Immature Granulocytes: 0 %
Lymphocytes Absolute: 2.7 x10E3/uL (ref 0.7–3.1)
Lymphs: 37 %
MCH: 30.2 pg (ref 26.6–33.0)
MCHC: 32.6 g/dL (ref 31.5–35.7)
MCV: 93 fL (ref 79–97)
Monocytes Absolute: 0.5 x10E3/uL (ref 0.1–0.9)
Monocytes: 6 %
Neutrophils Absolute: 4.1 x10E3/uL (ref 1.4–7.0)
Neutrophils: 56 %
Platelets: 487 x10E3/uL — ABNORMAL HIGH (ref 150–450)
RBC: 4.31 x10E6/uL (ref 3.77–5.28)
RDW: 13.1 % (ref 11.7–15.4)
WBC: 7.4 x10E3/uL (ref 3.4–10.8)

## 2024-03-12 LAB — TSH: TSH: 0.085 u[IU]/mL — ABNORMAL LOW (ref 0.450–4.500)

## 2024-03-12 LAB — LIPID PANEL
Chol/HDL Ratio: 4.1 ratio (ref 0.0–4.4)
Cholesterol, Total: 205 mg/dL — ABNORMAL HIGH (ref 100–199)
HDL: 50 mg/dL (ref >39)
LDL Chol Calc (NIH): 143 mg/dL — ABNORMAL HIGH (ref 0–99)
Triglycerides: 64 mg/dL (ref 0–149)
VLDL Cholesterol Cal: 12 mg/dL (ref 5–40)

## 2024-03-12 LAB — VITAMIN D 25 HYDROXY (VIT D DEFICIENCY, FRACTURES): Vit D, 25-Hydroxy: 32.9 ng/mL (ref 30.0–100.0)

## 2024-03-12 LAB — HEMOGLOBIN A1C
Est. average glucose Bld gHb Est-mCnc: 117 mg/dL
Hgb A1c MFr Bld: 5.7 % — ABNORMAL HIGH (ref 4.8–5.6)

## 2024-03-12 MED ORDER — PREGABALIN 150 MG PO CAPS: ORAL_CAPSULE | ORAL | 2 refills | Status: AC

## 2024-03-13 ENCOUNTER — Encounter: Payer: Self-pay | Admitting: Family Medicine

## 2024-03-13 ENCOUNTER — Ambulatory Visit: Payer: Self-pay | Admitting: Family Medicine

## 2024-03-13 DIAGNOSIS — Z23 Encounter for immunization: Secondary | ICD-10-CM | POA: Insufficient documentation

## 2024-03-13 DIAGNOSIS — G8929 Other chronic pain: Secondary | ICD-10-CM | POA: Insufficient documentation

## 2024-03-13 DIAGNOSIS — R7302 Impaired glucose tolerance (oral): Secondary | ICD-10-CM | POA: Insufficient documentation

## 2024-03-13 DIAGNOSIS — D75839 Thrombocytosis, unspecified: Secondary | ICD-10-CM

## 2024-03-13 DIAGNOSIS — H547 Unspecified visual loss: Secondary | ICD-10-CM | POA: Insufficient documentation

## 2024-03-13 DIAGNOSIS — Z131 Encounter for screening for diabetes mellitus: Secondary | ICD-10-CM | POA: Insufficient documentation

## 2024-03-13 MED ORDER — VITAMIN D (ERGOCALCIFEROL) 1.25 MG (50000 UNIT) PO CAPS: 50000.0000 [IU] | ORAL_CAPSULE | ORAL | 5 refills | Status: DC

## 2024-03-13 MED ORDER — LEVOTHYROXINE SODIUM 150 MCG PO TABS: 150.0000 ug | ORAL_TABLET | Freq: Every day | ORAL | 1 refills | Status: AC

## 2024-03-13 NOTE — Assessment & Plan Note (Signed)
 Currently between pain clinics, doismissed from most reent clinic reportedly as morphine  did not show on random UDS. Has no med until her new Provider is actually in the office to estab;lish care  Record review shows no Morphine  has been prescribed for several months, has been getting lyrica  will renew lyrica  at same dose for 3 months, should be with new Provider by that time. No Morphine  is being prescribed

## 2024-03-13 NOTE — Assessment & Plan Note (Addendum)
 Refer to alternate therapist as recommended , inc buspar  to 10 mg 3 times dailywill nbeed assistance from B Jewett to facilitate [place] is Martha Montgomery in [place] , [place], [phone]

## 2024-03-13 NOTE — Assessment & Plan Note (Signed)
Refer for eye exam

## 2024-03-13 NOTE — Assessment & Plan Note (Signed)
 Hep B and HPV administered no adverse s/e at visit after informed consent

## 2024-03-13 NOTE — Progress Notes (Signed)
 Martha Montgomery     MRN: [id number]      DOB: 12/31/1980  Chief Complaint  Patient presents with   Anxiety    Pt here to discuss referral for anxiety/depression    Blurred Vision    Pt is wanting vision referral for blurred vision     HPI Martha Montgomery is here for follow up and re-evaluation of chronic medical conditions, medication management and review of any available recent lab and radiology data.  Preventive health is updated, specifically  Cancer screening and Immunization.   Questions or concerns regarding consultations or procedures which the PT has had in the interim are  addressed. The PT denies any adverse reactions to current medications since the last visit.  There are no new concerns.  There are no specific complaints   ROS Denies recent fever or chills. Denies sinus pressure, nasal congestion, ear pain or sore throat. Denies chest congestion, productive cough or wheezing. Denies chest pains, palpitations and leg swelling Denies abdominal pain, nausea, vomiting,diarrhea or constipation.   Denies dysuria, frequency, hesitancy or incontinence.  Denies skin break down or rash.   PE  BP 114/76   Pulse (!) 106   Resp 16   Ht 5' 3 (1.6 m)   Wt 210 lb (95.3 kg)   SpO2 97%   BMI 37.20 kg/m   Patient alert and oriented and in no cardiopulmonary distress.  HEENT: No facial asymmetry, EOMI,     Neck supple .  Chest: Clear to auscultation bilaterally.  CVS: S1, S2 no murmurs, no S3.Regular rate.  ABD: Soft non tender.   Ext: No edema  MS: Adequate though reduced ROM spine,  adequate in shoulders, hips and reduced in knees.  Skin: Intact, no ulcerations or rash noted.  Psych: Good eye contact, normal affect. Memory intact not anxious or depressed appearing.  CNS: CN 2-12 intact, power,  normal throughout.no focal deficits noted.   Assessment & Plan  Decreased vision Reportsa progression in vision loss and requests opthal eval   GAD (generalized anxiety  disorder) Refer to alternate therapist as recommended , inc buspar  to 10 mg 3 times dailywill nbeed assistance from B Franco to facilitate [place] is Martha Montgomery in [place] , [place], [phone]   Depression, major, single episode, mild [facility]) Refer to therapist as recommended by current therapist  Chronic pain Currently between pain clinics, doismissed from most reent clinic reportedly as morphine  did not show on random UDS. Has no med until her new Provider is actually in the office to estab;lish care  Record review shows no Morphine  has been prescribed for several months, has been getting lyrica  will renew lyrica  at same dose for 3 months, should be with new Provider by that time. No Morphine  is being prescribed   Screening for diabetes mellitus Patient educated about the importance of limiting  Carbohydrate intake , the need to commit to daily physical activity for a minimum of 30 minutes , and to commit weight loss. The fact that changes in all these areas will reduce or eliminate all together the development of diabetes is stressed.      Latest Ref Rng & Units 03/11/2024    1:05 PM 06/09/2023    2:00 PM 03/26/2023    3:14 PM 01/22/2023    4:28 PM 01/14/2023    1:51 PM  Diabetic Labs  HbA1c 4.8 - 5.6 % 5.7       Chol 100 - 199 mg/dL 794    754  HDL >39 mg/dL 50    56    Calc LDL 0 - 99 mg/dL 856    824    Triglycerides 0 - 149 mg/dL 64    84    Creatinine 0.57 - 1.00 mg/dL 9.17  9.16  9.09  9.10  1.03       03/11/2024   11:06 AM 03/01/2024    2:22 PM 02/08/2024   11:47 AM 12/01/2023   10:19 AM 11/23/2023    9:43 AM 07/30/2023    4:55 PM 07/30/2023    4:03 PM  BP/Weight  Systolic BP 114 -- -- 128 -- 128 147  Diastolic BP 76 -- -- 90 -- 82 96  Wt. (Lbs) 210 209  209.4 210  218.12  BMI 37.2 kg/m2 37.02 kg/m2  37.09 kg/m2 37.2 kg/m2  38.64 kg/m2       No data to display            Immunization due Hep B and HPV administered no adverse s/e at visit after informed consent

## 2024-03-13 NOTE — Assessment & Plan Note (Signed)
 Patient educated about the importance of limiting  Carbohydrate intake , the need to commit to daily physical activity for a minimum of 30 minutes , and to commit weight loss. The fact that changes in all these areas will reduce or eliminate all together the development of diabetes is stressed.      Latest Ref Rng & Units 03/11/2024    1:05 PM 06/09/2023    2:00 PM 03/26/2023    3:14 PM 01/22/2023    4:28 PM 01/14/2023    1:51 PM  Diabetic Labs  HbA1c 4.8 - 5.6 % 5.7       Chol 100 - 199 mg/dL 794    754    HDL >60 mg/dL 50    56    Calc LDL 0 - 99 mg/dL 856    824    Triglycerides 0 - 149 mg/dL 64    84    Creatinine 0.57 - 1.00 mg/dL 9.17  9.16  9.09  9.10  1.03       03/11/2024   11:06 AM 03/01/2024    2:22 PM 02/08/2024   11:47 AM 12/01/2023   10:19 AM 11/23/2023    9:43 AM 07/30/2023    4:55 PM 07/30/2023    4:03 PM  BP/Weight  Systolic BP 114 -- -- 128 -- 128 147  Diastolic BP 76 -- -- 90 -- 82 96  Wt. (Lbs) 210 209  209.4 210  218.12  BMI 37.2 kg/m2 37.02 kg/m2  37.09 kg/m2 37.2 kg/m2  38.64 kg/m2       No data to display

## 2024-03-13 NOTE — Assessment & Plan Note (Signed)
 Refer to therapist as recommended by current therapist

## 2024-03-13 NOTE — Assessment & Plan Note (Signed)
 Reportsa progression in vision loss and requests opthal eval

## 2024-03-15 ENCOUNTER — Ambulatory Visit: Payer: Self-pay | Admitting: Family Medicine

## 2024-03-16 ENCOUNTER — Encounter: Payer: Self-pay | Admitting: Oncology

## 2024-03-16 ENCOUNTER — Inpatient Hospital Stay

## 2024-03-16 ENCOUNTER — Inpatient Hospital Stay: Attending: Oncology | Admitting: Oncology

## 2024-03-16 VITALS — BP 114/65 | HR 105 | Temp 98.0°F | Resp 19 | Wt 212.0 lb

## 2024-03-16 DIAGNOSIS — K589 Irritable bowel syndrome without diarrhea: Secondary | ICD-10-CM | POA: Diagnosis not present

## 2024-03-16 DIAGNOSIS — K648 Other hemorrhoids: Secondary | ICD-10-CM | POA: Insufficient documentation

## 2024-03-16 DIAGNOSIS — M797 Fibromyalgia: Secondary | ICD-10-CM | POA: Diagnosis not present

## 2024-03-16 DIAGNOSIS — D75839 Thrombocytosis, unspecified: Secondary | ICD-10-CM | POA: Insufficient documentation

## 2024-03-16 DIAGNOSIS — F419 Anxiety disorder, unspecified: Secondary | ICD-10-CM | POA: Diagnosis not present

## 2024-03-16 DIAGNOSIS — I10 Essential (primary) hypertension: Secondary | ICD-10-CM | POA: Insufficient documentation

## 2024-03-16 DIAGNOSIS — E039 Hypothyroidism, unspecified: Secondary | ICD-10-CM | POA: Insufficient documentation

## 2024-03-16 DIAGNOSIS — Z79899 Other long term (current) drug therapy: Secondary | ICD-10-CM | POA: Diagnosis not present

## 2024-03-16 DIAGNOSIS — F32A Depression, unspecified: Secondary | ICD-10-CM | POA: Insufficient documentation

## 2024-03-16 DIAGNOSIS — M4800 Spinal stenosis, site unspecified: Secondary | ICD-10-CM | POA: Diagnosis not present

## 2024-03-16 LAB — IRON AND TIBC
Iron: 44 ug/dL (ref 28–170)
Saturation Ratios: 12 % (ref 10.4–31.8)
TIBC: 366 ug/dL (ref 250–450)
UIBC: 322 ug/dL

## 2024-03-16 LAB — FOLATE: Folate: 11.3 ng/mL (ref >5.9)

## 2024-03-16 LAB — VITAMIN B12: Vitamin B-12: 2034 pg/mL — ABNORMAL HIGH (ref 180–914)

## 2024-03-16 LAB — SEDIMENTATION RATE: Sed Rate: 28 mm/h — ABNORMAL HIGH (ref 0–20)

## 2024-03-16 LAB — C-REACTIVE PROTEIN: CRP: 0.9 mg/dL (ref <1.0)

## 2024-03-16 LAB — FERRITIN: Ferritin: 80 ng/mL (ref 11–307)

## 2024-03-16 NOTE — Patient Instructions (Signed)
 You were seen and examined today by Dr. Davonna. Dr. Davonna is a hematologist, meaning that she specializes in blood abnormalities. Dr. Davonna discussed your past medical history, family history of cancers/blood conditions and the events that led to you being here today.  You were referred to Dr. Davonna due to thrombocytosis (elevated platelet count).  Dr. Davonna has recommended additional labs today for further evaluation.  Follow-up as scheduled.

## 2024-03-16 NOTE — Progress Notes (Signed)
 [facility] at [facility]  HEMATOLOGY NEW VISIT  Martha Rollene BRAVO, MD  REASON FOR REFERRAL: Thrombocytosis  SUMMARY OF HEMATOLOGIC HISTORY: Thrombocytosis:  -Elevated platelets prevalent since 09/22/2013, ranging from 382 K/uL to 492 K/uL from 2015 to 2025 -03/11/2024: CBC diff: PLT at 487. TSH: 0.085. CMP normal.    HISTORY OF PRESENT ILLNESS: Martha Montgomery 43 y.o. female referred for thrombocytosis. The patient is accompanied by her friend.  Patient has a medical history of hypertension, hypothyroidism, gastritis, GERD, fibromyalgia, arthritis, depression, and anxiety. She has a surgical history of total thyroidectomy, colonoscopy, and right knee arthroscopy.   Reports chronic joint pain and low back pain.  She has had several surgeries on both her knees most recently on her left knee in November 2024.  Reports she has constant pain and is followed by a pain specialist.  States she has been told she has arthritis and spinal stenosis in her lower back.   Reports chronic gastroenterology issues.  She has abdominal bloating and constipation daily.  Reports she does have a gastroenterologist and has had several upper endoscopies and colonoscopies (last on 10/22/2017).  Recommend a repeat colonoscopy in 10 years.  She did have some internal hemorrhoids.  Reports she has been diagnosed with IBS-C but is currently not on any medication. She has not had menstrual cycle in greater than 2 decades because she is on Depo-Provera .  Occasional hemorrhoidal bleeding with bowel movements and early constipation.  Reports she takes B12 and Kratom and herbal supplement that is supposed to help with inflammation and her mood.  She does not take iron supplements.  History of lung cancer (maternal grandmother) and thyroid  cancer (mother).  No blood cancers that she is aware of.  I have reviewed the past medical history, past surgical history, social history and family history with  the patient   ALLERGIES:  is allergic to aspirin , codeine, diclofenac , ibuprofen , metronidazole , nsaids, and soybean-containing drug products.  MEDICATIONS:  Current Outpatient Medications  Medication Sig Dispense Refill   busPIRone  (BUSPAR ) 10 MG tablet Take 1 tablet (10 mg total) by mouth 3 (three) times daily. 90 tablet 3   Cyanocobalamin  (B-12 PO) Take 1 tablet by mouth daily.     diphenhydrAMINE  (BENADRYL ) 25 MG tablet Take 25 mg by mouth daily.     EPINEPHrine  0.3 mg/0.3 mL IJ SOAJ injection Inject 0.3 mg into the muscle as needed for anaphylaxis. 1 each 0   escitalopram  (LEXAPRO ) 20 MG tablet Take 1 tablet (20 mg total) by mouth daily. 30 tablet 2   fluticasone  (FLONASE ) 50 MCG/ACT nasal spray Place 2 sprays into both nostrils daily. 15.8 mL 1   Ginger, Zingiber officinalis, (GINGER PO) Take 1 capsule by mouth daily.     levothyroxine  (SYNTHROID ) 150 MCG tablet Take 1 tablet (150 mcg total) by mouth daily. 90 tablet 1   levothyroxine  (SYNTHROID ) 175 MCG tablet Take 175 mcg by mouth daily.     lubiprostone  (AMITIZA ) 24 MCG capsule      medroxyPROGESTERone  (DEPO-PROVERA ) 150 MG/ML injection Inject 150 mg into the muscle every 3 (three) months.     Olmesartan -amLODIPine -HCTZ 40-10-25 MG TABS Take 1 tablet by mouth daily. 90 tablet 1   pantoprazole  (PROTONIX ) 40 MG tablet Take 1 tablet (40 mg total) by mouth 2 (two) times daily before a meal. (Patient taking differently: Take 40 mg by mouth daily.) 180 tablet 3   pregabalin  (LYRICA ) 150 MG capsule Take 150 mg by mouth 3 (three) times daily.  pregabalin  (LYRICA ) 150 MG capsule Take one capsule by mouth four times daily 120 capsule 2   rosuvastatin  (CRESTOR ) 20 MG tablet TAKE 1 TABLET(20 MG) BY MOUTH DAILY 30 tablet 5   traMADol  (ULTRAM ) 50 MG tablet Take 2 tablets (100 mg total) by mouth daily. 30 tablet 0   Vitamin D , Ergocalciferol , (DRISDOL ) 1.25 MG (50000 UNIT) CAPS capsule Take 1 capsule (50,000 Units total) by mouth once a week. 5  capsule 5   morphine  (MSIR) 15 MG tablet Take 15 mg by mouth 3 (three) times daily as needed. (Patient not taking: Reported on 03/16/2024)     Turmeric (QC TUMERIC COMPLEX PO) Take 1 capsule by mouth daily. (Patient not taking: Reported on 03/16/2024)     No current facility-administered medications for this visit.     REVIEW OF SYSTEMS:   Review of Systems  Cardiovascular:  Positive for palpitations.  Gastrointestinal:  Positive for abdominal pain, blood in stool, constipation and heartburn.  Neurological:  Positive for tingling and headaches.     PHYSICAL EXAMINATION:   Vitals:   03/16/24 0823  BP: 114/65  Pulse: (!) 105  Resp: 19  Temp: 98 F (36.7 C)  SpO2: 98%    GENERAL:alert, no distress and comfortable SKIN: skin color, texture, turgor are normal, no rashes or significant lesions EYES: normal, Conjunctiva are pink and non-injected, sclera clear OROPHARYNX:no exudate, no erythema and lips, buccal mucosa, and tongue normal  NECK: supple, thyroid  normal size, non-tender, without nodularity LYMPH:  no palpable lymphadenopathy in the cervical, axillary or inguinal LUNGS: clear to auscultation and percussion with normal breathing effort HEART: regular rate & rhythm and no murmurs and no lower extremity edema ABDOMEN:abdomen soft, non-tender and normal bowel sounds Musculoskeletal:no cyanosis of digits and no clubbing  NEURO: alert & oriented x 3 with fluent speech, no focal motor/sensory deficits  LABORATORY DATA:  I have reviewed the data as listed  Lab Results  Component Value Date   WBC 7.4 03/11/2024   NEUTROABS 4.1 03/11/2024   HGB 13.0 03/11/2024   HCT 39.9 03/11/2024   MCV 93 03/11/2024   PLT 487 (H) 03/11/2024    RADIOGRAPHIC STUDIES: I have personally reviewed the radiological images as listed and agreed with the findings in the report.  MM 3D SCREENING MAMMOGRAM BILATERAL BREAST CLINICAL DATA:  Screening.  EXAM: DIGITAL SCREENING BILATERAL  MAMMOGRAM WITH TOMOSYNTHESIS AND CAD  TECHNIQUE: Bilateral screening digital craniocaudal and mediolateral oblique mammograms were obtained. Bilateral screening digital breast tomosynthesis was performed. The images were evaluated with computer-aided detection.  COMPARISON:  Previous exam(s).  ACR Breast Density Category d: The breasts are extremely dense, which lowers the sensitivity of mammography.  FINDINGS: There are no findings suspicious for malignancy.  IMPRESSION: No mammographic evidence of malignancy. A result letter of this screening mammogram will be mailed directly to the patient.  RECOMMENDATION: Screening mammogram in one year. (Code:SM-B-01Y)  BI-RADS CATEGORY  1: Negative.  Electronically Signed   By: Corean Salter M.D.   On: 01/05/2024 14:16   ASSESSMENT & PLAN:  Patient is a 43 y.o. female referred for thrombocytosis.  Assessment & Plan Thrombocytosis We discussed common causes for elevation in her platelet counts including chronic inflammation and iron deficiency anemia. Recommend labs today and we will follow-up via telephone with results. Discussed return to clinic in 3 months with additional labs and an office visit.   Orders Placed This Encounter  Procedures   Iron and TIBC ([facility] ONLY)    Standing Status:  Future    Number of Occurrences:   1    Expected Date:   03/16/2024    Expiration Date:   06/14/2024   Ferritin    Standing Status:   Future    Number of Occurrences:   1    Expected Date:   03/16/2024    Expiration Date:   06/14/2024   Vitamin B12    Standing Status:   Future    Number of Occurrences:   1    Expected Date:   03/16/2024    Expiration Date:   06/14/2024   Folate    Standing Status:   Future    Number of Occurrences:   1    Expected Date:   03/16/2024    Expiration Date:   06/14/2024   Sedimentation rate    Standing Status:   Future    Number of Occurrences:   1    Expected Date:    03/16/2024    Expiration Date:   06/14/2024   C-reactive protein    Standing Status:   Future    Number of Occurrences:   1    Expected Date:   03/16/2024    Expiration Date:   06/14/2024   CBC    Standing Status:   Future    Expected Date:   06/13/2024    Expiration Date:   09/11/2024   Iron and TIBC ([facility] ONLY)    Standing Status:   Future    Expected Date:   06/13/2024    Expiration Date:   09/11/2024   Ferritin    Standing Status:   Future    Expected Date:   06/13/2024    Expiration Date:   09/11/2024   Vitamin B12    Standing Status:   Future    Expected Date:   06/13/2024    Expiration Date:   09/11/2024   Folate    Standing Status:   Future    Expected Date:   06/13/2024    Expiration Date:   09/11/2024   Ambulatory referral to Social Work    Standing Status:   Future    Expected Date:   03/16/2024    Expiration Date:   06/14/2024    Referral Priority:   Routine    Referral Type:   Consultation    Referral Reason:   Specialty Services Required    Referred to Provider:   Davonna Siad, MD    Number of Visits Requested:   1    The total time spent in the appointment was 40 minutes encounter with patients including review of chart and various tests results, discussions about plan of care and coordination of care plan   All questions were answered. The patient knows to call the clinic with any problems, questions or concerns. No barriers to learning was detected.   Delon FORBES Hope, NP 8/20/20259:29 AM  Addendum: I saw this patient with Delon CHARLENA Hope, NP.  I individually reviewed her chart, assessed her, did a physical exam and discussed assessment and plan with her.  Patient is a 43 year old female with past medical history of hypertension, hypothyroidism, GERD, fibromyalgia, arthritis referred for thrombocytosis.  On chart review patient has a platelets of 487 and has had on and off thrombocytosis since 2010.  She never had a thrombosis.   Patient is on Depo and does not have menstrual bleeding, reports some hemorrhoidal bleeding very often.  We discussed all the causes of thrombocytosis including iron deficiency, inflammation.  Will do the workup for iron deficiency and inflammatory markers.  Will replace as needed.  Will repeat labs in 3 months and if the counts does not improve, can consider JAK2 mutation analysis at this time.  I do think that patient has several factors contributing to this thrombocytosis including fibromyalgia, I think MPN is very low in the differential at this time.  If patient has iron deficiency, will refer to GI for further workup.  The total time spent in the appointment was 20 minutes encounter with patients including review of chart and various tests results, discussions about plan of care and coordination of care plan  Mickiel Dry, MD Hematology/Oncology [facility]

## 2024-03-16 NOTE — Assessment & Plan Note (Addendum)
 We discussed common causes for elevation in her platelet counts including chronic inflammation and iron deficiency anemia. Recommend labs today and we will follow-up via telephone with results. Discussed return to clinic in 3 months with additional labs and an office visit.

## 2024-03-21 ENCOUNTER — Other Ambulatory Visit: Payer: Self-pay | Admitting: Licensed Clinical Social Worker

## 2024-03-21 DIAGNOSIS — D509 Iron deficiency anemia, unspecified: Secondary | ICD-10-CM | POA: Insufficient documentation
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
## 2024-03-21 NOTE — Patient Instructions (Signed)
 Visit Information  Thank you for taking time to visit with me today. Please don't hesitate to contact me if I can be of assistance to you before our next scheduled appointment.  Our next appointment is by telephone on 05/03/2024 at 2:00 PM    Please call the care guide team at [phone] if you need to cancel or reschedule your appointment.   Following is a copy of your care plan:   Goals Addressed             This Visit's Progress    VBCI Social Work Care Plan       Problems:   Financial challenges with co-pays             Occasional transport challenges             Occasional food issues            Pain Issues (Is not longer going to Pain Clinic: client was released from care at Pain Clinic)             Mental Health Issues (Depression and Anxiety)             CSW Clinical Goal(s):   Over the next 30 days the Patient will attend all scheduled mental health appointments (Virtual or in person) AEB patient report.              Over next 30 days the Patient will continue exercise regime by exercising at least 2 times per week AEB patient report   Interventions:  Discussed medications procurement               Discussed exercise regime               Discussed pain management. Client was released from care at Pain Clinic. Client talked of using prescribed Tramadol  but said it is not as strong as pain med she used to  use.  Discussed sleeping difficulty of client               Discussed vision of client. Discussed stomach pain issues of client. Martha Montgomery said she would like to talk with Gastroenterologist about stomach pain issues              Discussed caregiver stress issues Martha Montgomery cares for her cousin who resides with [place])             Discussed transport needs; discussed appetite. Client likes to eat healthy foods and vegetables              Discussed ambulation of client. Client has completed physical therapy sessions. She said she walks slowly and takes her time when walking              Discussed mental health needs. Client to call counseling agency to inquire about possible appointments for counseling support           Encouraged client to call nurse at PCP office to talk with nurse about client referrals that had been placed           Provided counseling support for client                             Patient Goals/Self-Care Activities:  Client will take medications as prescribed             Client will attend appointments as scheduled with PCP  Client will attend mental health appointments as scheduled.              Client will practice self care (rest, relaxation, eating meals as scheduled)             Client will communicate with LCSW as needed for SW support Plan:   LCSW will call client on 05/03/2024 at 2:00 PM         Please go to [facility] [address], [place] [phone]) if you are experiencing a Mental Health or Behavioral Health Crisis or need someone to talk to.  The patient verbalized understanding of instructions, educational materials, and care plan provided today and DECLINED offer to receive copy of patient instructions, educational materials, and care plan.    Martha Montgomery  MSW, LCSW [place] Licensed Clinical Social Worker Direct Dial:  [phone] Fax:  [phone] Website:  [email or web address]

## 2024-03-22 ENCOUNTER — Inpatient Hospital Stay

## 2024-03-22 VITALS — BP 110/77 | HR 86 | Temp 97.0°F | Resp 18

## 2024-03-22 DIAGNOSIS — D509 Iron deficiency anemia, unspecified: Secondary | ICD-10-CM

## 2024-03-22 DIAGNOSIS — D75839 Thrombocytosis, unspecified: Secondary | ICD-10-CM | POA: Diagnosis not present

## 2024-03-22 MED ORDER — FAMOTIDINE IN NACL 20-0.9 MG/50ML-% IV SOLN
20.0000 mg | Freq: Once | INTRAVENOUS | Status: AC
  Administered 2024-03-22: 20 mg via INTRAVENOUS
  Filled 2024-03-22: qty 50

## 2024-03-22 MED ORDER — SODIUM CHLORIDE 0.9 % IV SOLN
510.0000 mg | Freq: Once | INTRAVENOUS | Status: AC
  Administered 2024-03-22: 510 mg via INTRAVENOUS
  Filled 2024-03-22: qty 510

## 2024-03-22 MED ORDER — CETIRIZINE HCL 10 MG/ML IV SOLN
10.0000 mg | Freq: Once | INTRAVENOUS | Status: AC
Start: 2024-03-22 — End: 2024-03-22
  Administered 2024-03-22: 10 mg via INTRAVENOUS
  Filled 2024-03-22: qty 1

## 2024-03-22 MED ORDER — SODIUM CHLORIDE 0.9 % IV SOLN: INTRAVENOUS | Status: DC

## 2024-03-22 MED ORDER — ACETAMINOPHEN 325 MG PO TABS
650.0000 mg | ORAL_TABLET | Freq: Once | ORAL | Status: AC
  Administered 2024-03-22: 650 mg via ORAL
  Filled 2024-03-22: qty 2

## 2024-03-22 NOTE — Patient Instructions (Signed)

## 2024-03-22 NOTE — Progress Notes (Signed)
 Patient tolerated iron  infusion with no complaints voiced.  Peripheral IV site clean and dry with good blood return noted before and after infusion.  Pt observed for 30 minutes post iron  infusion without any complications. VSS with discharge and left in satisfactory condition with no s/s of distress noted. All follow ups as scheduled.  Martha Montgomery

## 2024-03-23 ENCOUNTER — Inpatient Hospital Stay: Admitting: Licensed Clinical Social Worker

## 2024-03-23 DIAGNOSIS — D509 Iron deficiency anemia, unspecified: Secondary | ICD-10-CM

## 2024-03-23 NOTE — Progress Notes (Signed)
 [facility] CSW Progress Note  Clinical Child psychotherapist contacted patient by phone to follow-up on SDOH needs.    Interventions: CSW spoke w/ pt regarding SDOH screen.  Pt reports at this time she does not have any concerns regarding SDOH needs.  Pt provided w/ contact details for CSW should any issues arise in the future.        Follow Up Plan:  Patient will contact CSW with any support or resource needs    [name], LCSW Clinical Social Worker [facility]

## 2024-03-29 ENCOUNTER — Inpatient Hospital Stay: Attending: Oncology

## 2024-03-29 VITALS — BP 116/82 | HR 91 | Temp 98.2°F | Resp 18

## 2024-03-29 DIAGNOSIS — D509 Iron deficiency anemia, unspecified: Secondary | ICD-10-CM | POA: Insufficient documentation

## 2024-03-29 MED ORDER — SODIUM CHLORIDE 0.9 % IV SOLN
INTRAVENOUS | Status: DC
Start: 2024-03-29 — End: 2024-03-29

## 2024-03-29 MED ORDER — ACETAMINOPHEN 325 MG PO TABS
650.0000 mg | ORAL_TABLET | Freq: Once | ORAL | Status: AC
  Administered 2024-03-29: 650 mg via ORAL
  Filled 2024-03-29: qty 2

## 2024-03-29 MED ORDER — SODIUM CHLORIDE 0.9 % IV SOLN
510.0000 mg | Freq: Once | INTRAVENOUS | Status: AC
  Administered 2024-03-29: 510 mg via INTRAVENOUS
  Filled 2024-03-29: qty 510

## 2024-03-29 MED ORDER — CETIRIZINE HCL 10 MG/ML IV SOLN
10.0000 mg | Freq: Once | INTRAVENOUS | Status: AC
  Administered 2024-03-29: 10 mg via INTRAVENOUS
  Filled 2024-03-29: qty 1

## 2024-03-29 MED ORDER — FAMOTIDINE IN NACL 20-0.9 MG/50ML-% IV SOLN
20.0000 mg | Freq: Once | INTRAVENOUS | Status: AC
  Administered 2024-03-29: 20 mg via INTRAVENOUS
  Filled 2024-03-29: qty 50

## 2024-03-29 NOTE — Patient Instructions (Signed)
 [facility] [place] - A DEPT OF [facility]  Discharge Instructions: Thank you for choosing [facility] to provide your oncology and hematology care.  If you have a lab appointment with the Cancer Center - please note that after April 8th, 2024, all labs will be drawn in the cancer center.  You do not have to check in or register with the main entrance as you have in the past but will complete your check-in in the cancer center.  Wear comfortable clothing and clothing appropriate for easy access to any Portacath or PICC line.   We strive to give you quality time with your provider. You may need to reschedule your appointment if you arrive late (15 or more minutes).  Arriving late affects you and other patients whose appointments are after yours.  Also, if you miss three or more appointments without notifying the office, you may be dismissed from the clinic at the provider's discretion.      For prescription refill requests, have your pharmacy contact our office and allow 72 hours for refills to be completed.    Today you received the following:  Feraheme.  Ferumoxytol Injection What is this medication? FERUMOXYTOL (FER ue MOX i tol) treats low levels of iron in your body (iron deficiency anemia). Iron is a mineral that plays an important role in making red blood cells, which carry oxygen from your lungs to the rest of your body. This medicine may be used for other purposes; ask your health care provider or pharmacist if you have questions. COMMON BRAND NAME(S): Feraheme What should I tell my care team before I take this medication? They need to know if you have any of these conditions: Anemia not caused by low iron levels High levels of iron in the blood Magnetic resonance imaging (MRI) test scheduled An unusual or allergic reaction to iron, other medications, foods, dyes, or preservatives Pregnant or trying to get pregnant Breastfeeding How should I  use this medication? This medication is injected into a vein. It is given by your care team in a hospital or clinic setting. Talk to your care team the use of this medication in children. Special care may be needed. Overdosage: If you think you have taken too much of this medicine contact a poison control center or emergency room at once. NOTE: This medicine is only for you. Do not share this medicine with others. What if I miss a dose? It is important not to miss your dose. Call your care team if you are unable to keep an appointment. What may interact with this medication? Other iron products This list may not describe all possible interactions. Give your health care provider a list of all the medicines, herbs, non-prescription drugs, or dietary supplements you use. Also tell them if you smoke, drink alcohol, or use illegal drugs. Some items may interact with your medicine. What should I watch for while using this medication? Visit your care team for regular checks on your progress. Tell your care team if your symptoms do not start to get better or if they get worse. You may need blood work done while you are taking this medication. You may need to eat more foods that contain iron. Talk to your care team. Foods that contain iron include whole grains or cereals, dried fruits, beans, peas, leafy green vegetables, and organ meats (liver, kidney). What side effects may I notice from receiving this medication? Side effects that you should  report to your care team as soon as possible: Allergic reactions--skin rash, itching, hives, swelling of the face, lips, tongue, or throat Low blood pressure--dizziness, feeling faint or lightheaded, blurry vision Shortness of breath Side effects that usually do not require medical attention (report to your care team if they continue or are bothersome): Flushing Headache Joint pain Muscle pain Nausea Pain, redness, or irritation at injection site This list  may not describe all possible side effects. Call your doctor for medical advice about side effects. You may report side effects to FDA at 1-800-FDA-1088. Where should I keep my medication? This medication is given in a hospital or clinic. It will not be stored at home. NOTE: This sheet is a summary. It may not cover all possible information. If you have questions about this medicine, talk to your doctor, pharmacist, or health care provider.  2024 Elsevier/Gold Standard (2023-03-04 00:00:00)    To help prevent nausea and vomiting after your treatment, we encourage you to take your nausea medication as directed.  BELOW ARE SYMPTOMS THAT SHOULD BE REPORTED IMMEDIATELY: *FEVER GREATER THAN 100.4 F (38 C) OR HIGHER *CHILLS OR SWEATING *NAUSEA AND VOMITING THAT IS NOT CONTROLLED WITH YOUR NAUSEA MEDICATION *UNUSUAL SHORTNESS OF BREATH *UNUSUAL BRUISING OR BLEEDING *URINARY PROBLEMS (pain or burning when urinating, or frequent urination) *BOWEL PROBLEMS (unusual diarrhea, constipation, pain near the anus) TENDERNESS IN MOUTH AND THROAT WITH OR WITHOUT PRESENCE OF ULCERS (sore throat, sores in mouth, or a toothache) UNUSUAL RASH, SWELLING OR PAIN  UNUSUAL VAGINAL DISCHARGE OR ITCHING   Items with * indicate a potential emergency and should be followed up as soon as possible or go to the Emergency Department if any problems should occur.  Please show the CHEMOTHERAPY ALERT CARD or IMMUNOTHERAPY ALERT CARD at check-in to the Emergency Department and triage nurse.  Should you have questions after your visit or need to cancel or reschedule your appointment, please contact [facility] [place] - A DEPT OF [facility] [phone]  and follow the prompts.  Office hours are 8:00 a.m. to 4:30 p.m. Monday - Friday. Please note that voicemails left after 4:00 p.m. may not be returned until the following business day.  We are closed weekends and major holidays. You have access to a  nurse at all times for urgent questions. Please call the main number to the clinic [phone] and follow the prompts.  For any non-urgent questions, you may also contact your provider using MyChart. We now offer e-Visits for anyone 39 and older to request care online for non-urgent symptoms. For details visit [email or web address].   Also download the MyChart app! Go to the app store, search "MyChart", open the app, select [place], and log in with your MyChart username and password.

## 2024-03-29 NOTE — Progress Notes (Signed)
Patient presents today for iron infusion.  Patient is in satisfactory condition with no new complaints voiced.  Vital signs are stable. IV placed in left AC.  IV flushed well with good blood return noted.  We will proceed with infusion per provider orders.    Patient tolerated infusion well with no complaints voiced.  Patient left ambulatory in stable condition.  Vital signs stable at discharge.  Follow up as scheduled.

## 2024-04-20 ENCOUNTER — Other Ambulatory Visit (HOSPITAL_COMMUNITY): Payer: Self-pay | Admitting: Nurse Practitioner

## 2024-04-20 ENCOUNTER — Ambulatory Visit (HOSPITAL_COMMUNITY)
Admission: RE | Admit: 2024-04-20 | Discharge: 2024-04-20 | Disposition: A | Discharge status: Home / Self Care | Source: Ambulatory Visit | Attending: Nurse Practitioner | Admitting: Nurse Practitioner

## 2024-04-20 DIAGNOSIS — M25551 Pain in right hip: Secondary | ICD-10-CM | POA: Insufficient documentation

## 2024-04-20 DIAGNOSIS — M545 Low back pain, unspecified: Secondary | ICD-10-CM

## 2024-04-24 ENCOUNTER — Other Ambulatory Visit: Payer: Self-pay

## 2024-04-24 ENCOUNTER — Emergency Department (HOSPITAL_COMMUNITY)
Admission: EM | Admit: 2024-04-24 | Discharge: 2024-04-24 | Discharge status: Against Medical Advice | Attending: Emergency Medicine | Admitting: Emergency Medicine

## 2024-04-24 ENCOUNTER — Encounter (HOSPITAL_COMMUNITY): Payer: Self-pay | Admitting: *Deleted

## 2024-04-24 DIAGNOSIS — Z5321 Procedure and treatment not carried out due to patient leaving prior to being seen by health care provider: Secondary | ICD-10-CM | POA: Insufficient documentation

## 2024-04-24 DIAGNOSIS — M79652 Pain in left thigh: Secondary | ICD-10-CM | POA: Diagnosis present

## 2024-04-24 NOTE — ED Triage Notes (Signed)
 Pt with left inner thigh pain-feels tight today. Tender to touch. Denies hx of DVT's

## 2024-04-29 ENCOUNTER — Other Ambulatory Visit: Payer: Self-pay | Admitting: Family Medicine

## 2024-05-03 ENCOUNTER — Other Ambulatory Visit: Payer: Self-pay | Admitting: Licensed Clinical Social Worker
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
## 2024-05-25 ENCOUNTER — Other Ambulatory Visit (HOSPITAL_COMMUNITY): Payer: Self-pay | Admitting: Nurse Practitioner

## 2024-05-25 DIAGNOSIS — M5416 Radiculopathy, lumbar region: Secondary | ICD-10-CM

## 2024-05-28 ENCOUNTER — Ambulatory Visit (HOSPITAL_COMMUNITY)
Admission: RE | Admit: 2024-05-28 | Discharge: 2024-05-28 | Disposition: A | Discharge status: Home / Self Care | Source: Ambulatory Visit | Attending: Nurse Practitioner | Admitting: Nurse Practitioner

## 2024-05-28 DIAGNOSIS — M5416 Radiculopathy, lumbar region: Secondary | ICD-10-CM | POA: Insufficient documentation

## 2024-05-30 ENCOUNTER — Encounter: Payer: Self-pay | Admitting: Radiology

## 2024-06-06 ENCOUNTER — Other Ambulatory Visit: Payer: Self-pay

## 2024-06-06 DIAGNOSIS — I10 Essential (primary) hypertension: Secondary | ICD-10-CM

## 2024-06-21 ENCOUNTER — Other Ambulatory Visit: Payer: Self-pay | Admitting: Licensed Clinical Social Worker
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
## 2024-06-28 ENCOUNTER — Inpatient Hospital Stay: Attending: Oncology

## 2024-07-05 ENCOUNTER — Inpatient Hospital Stay: Admitting: Oncology

## 2024-07-19 ENCOUNTER — Telehealth: Admitting: Licensed Clinical Social Worker
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
## 2024-07-23 ENCOUNTER — Other Ambulatory Visit: Payer: Self-pay | Admitting: Family Medicine

## 2024-07-25 ENCOUNTER — Encounter: Payer: Self-pay | Admitting: *Deleted

## 2024-07-26 ENCOUNTER — Other Ambulatory Visit: Payer: Self-pay | Admitting: Family Medicine

## 2024-08-18 ENCOUNTER — Ambulatory Visit: Admitting: Family Medicine

## 2024-08-18 ENCOUNTER — Other Ambulatory Visit (HOSPITAL_COMMUNITY): Payer: Self-pay | Admitting: Family Medicine

## 2024-08-18 ENCOUNTER — Encounter: Payer: Self-pay | Admitting: Family Medicine

## 2024-08-18 VITALS — BP 140/86 | HR 94 | Resp 16 | Ht 63.0 in | Wt 225.0 lb

## 2024-08-18 DIAGNOSIS — K5904 Chronic idiopathic constipation: Secondary | ICD-10-CM

## 2024-08-18 DIAGNOSIS — R339 Retention of urine, unspecified: Secondary | ICD-10-CM

## 2024-08-18 DIAGNOSIS — I10 Essential (primary) hypertension: Secondary | ICD-10-CM

## 2024-08-18 DIAGNOSIS — Z131 Encounter for screening for diabetes mellitus: Secondary | ICD-10-CM

## 2024-08-18 DIAGNOSIS — R7302 Impaired glucose tolerance (oral): Secondary | ICD-10-CM | POA: Diagnosis not present

## 2024-08-18 DIAGNOSIS — E559 Vitamin D deficiency, unspecified: Secondary | ICD-10-CM

## 2024-08-18 DIAGNOSIS — Z23 Encounter for immunization: Secondary | ICD-10-CM

## 2024-08-18 DIAGNOSIS — E7849 Other hyperlipidemia: Secondary | ICD-10-CM | POA: Diagnosis not present

## 2024-08-18 DIAGNOSIS — Z1231 Encounter for screening mammogram for malignant neoplasm of breast: Secondary | ICD-10-CM

## 2024-08-18 NOTE — Patient Instructions (Addendum)
 Annual exam in 16 to 18 weeks  Hep B#2 today  Please call for appt with therapist in [place], nurse pls provide contact info and the phone number  Please schedule June mammogram at checkout  cBC, lipid,cmp and EGFR, HBA1C TSH and vit D today  You will be referred to Urology and GI, change your diet and commit to senokt s daily in the interim  It is important that you exercise regularly at least 30 minutes 5 times a week. If you develop chest pain, have severe difficulty breathing, or feel very tired, stop exercising immediately and seek medical attention   Thanks for choosing [facility], we consider it a privelige to serve you.

## 2024-08-19 LAB — CMP14+EGFR
ALT: 16 IU/L (ref 0–32)
AST: 21 IU/L (ref 0–40)
Albumin: 4.2 g/dL (ref 3.9–4.9)
Alkaline Phosphatase: 75 IU/L (ref 41–116)
BUN/Creatinine Ratio: 11 (ref 9–23)
BUN: 9 mg/dL (ref 6–24)
Bilirubin Total: <0.2 mg/dL (ref 0.0–1.2)
CO2: 21 mmol/L (ref 20–29)
Calcium: 8.5 mg/dL — ABNORMAL LOW (ref 8.7–10.2)
Chloride: 104 mmol/L (ref 96–106)
Creatinine, Ser: 0.85 mg/dL (ref 0.57–1.00)
Globulin, Total: 2.3 g/dL (ref 1.5–4.5)
Glucose: 79 mg/dL (ref 70–99)
Potassium: 3.8 mmol/L (ref 3.5–5.2)
Sodium: 143 mmol/L (ref 134–144)
Total Protein: 6.5 g/dL (ref 6.0–8.5)
eGFR: 88 mL/min/1.73

## 2024-08-19 LAB — CBC WITH DIFFERENTIAL/PLATELET
Basophils Absolute: 0.1 x10E3/uL (ref 0.0–0.2)
Basos: 1 %
EOS (ABSOLUTE): 0 x10E3/uL (ref 0.0–0.4)
Eos: 1 %
Hematocrit: 40 % (ref 34.0–46.6)
Hemoglobin: 13.3 g/dL (ref 11.1–15.9)
Immature Grans (Abs): 0 x10E3/uL (ref 0.0–0.1)
Immature Granulocytes: 0 %
Lymphocytes Absolute: 2.4 x10E3/uL (ref 0.7–3.1)
Lymphs: 44 %
MCH: 31 pg (ref 26.6–33.0)
MCHC: 33.3 g/dL (ref 31.5–35.7)
MCV: 93 fL (ref 79–97)
Monocytes Absolute: 0.5 x10E3/uL (ref 0.1–0.9)
Monocytes: 8 %
Neutrophils Absolute: 2.6 x10E3/uL (ref 1.4–7.0)
Neutrophils: 45 %
Platelets: 472 x10E3/uL — ABNORMAL HIGH (ref 150–450)
RBC: 4.29 x10E6/uL (ref 3.77–5.28)
RDW: 12.7 % (ref 11.7–15.4)
WBC: 5.6 x10E3/uL (ref 3.4–10.8)

## 2024-08-19 LAB — LIPID PANEL
Chol/HDL Ratio: 4.1 ratio (ref 0.0–4.4)
Cholesterol, Total: 212 mg/dL — ABNORMAL HIGH (ref 100–199)
HDL: 52 mg/dL
LDL Chol Calc (NIH): 144 mg/dL — ABNORMAL HIGH (ref 0–99)
Triglycerides: 88 mg/dL (ref 0–149)
VLDL Cholesterol Cal: 16 mg/dL (ref 5–40)

## 2024-08-19 LAB — HEMOGLOBIN A1C
Est. average glucose Bld gHb Est-mCnc: 114 mg/dL
Hgb A1c MFr Bld: 5.6 % (ref 4.8–5.6)

## 2024-08-19 LAB — TSH: TSH: 2.65 u[IU]/mL (ref 0.450–4.500)

## 2024-08-19 LAB — VITAMIN D 25 HYDROXY (VIT D DEFICIENCY, FRACTURES): Vit D, 25-Hydroxy: 29.3 ng/mL — ABNORMAL LOW (ref 30.0–100.0)

## 2024-08-21 ENCOUNTER — Ambulatory Visit: Payer: Self-pay | Admitting: Family Medicine

## 2024-08-22 ENCOUNTER — Encounter: Payer: Self-pay | Admitting: Family Medicine

## 2024-08-22 DIAGNOSIS — R339 Retention of urine, unspecified: Secondary | ICD-10-CM | POA: Insufficient documentation

## 2024-08-22 DIAGNOSIS — E559 Vitamin D deficiency, unspecified: Secondary | ICD-10-CM | POA: Insufficient documentation

## 2024-08-22 NOTE — Assessment & Plan Note (Signed)
 DASH diet and commitment to daily physical activity for a minimum of 30 minutes discussed and encouraged, as a part of hypertension management. The importance of attaining a healthy weight is also discussed. Iuncontrolled , compliance needs tro be determined needs BP controlled , will send message     08/18/2024   11:23 AM 08/18/2024   11:00 AM 06/21/2024    3:54 PM 05/03/2024    4:06 PM 04/24/2024    7:00 PM 04/24/2024    6:57 PM 03/29/2024    4:11 PM  BP/Weight  Systolic BP 140 141 -- -- 128  116  Diastolic BP 86 97 -- -- 77  82  Wt. (Lbs)  225    208   BMI  39.86 kg/m2    36.85 kg/m2

## 2024-08-22 NOTE — Assessment & Plan Note (Signed)
 Updated lab needs to commit to daily vit D3, current level is 29.3

## 2024-08-22 NOTE — Progress Notes (Signed)
 "  Martha Montgomery     MRN: [id number]      DOB: July 07, 1981  Chief Complaint  Patient presents with   Hypertension    Follow up    Urinary Retention    Pt wanting referral to urology for urinary retention    Constipation    Pt still having ongoing constipation, has changed diet but still no improvement. Needing new referral to GI     HPI Martha Montgomery is here for follow up and re-evaluation of chronic medical conditions, medication management and review of any available recent lab and radiology data.  Preventive health is updated, specifically  Cancer screening and Immunization.   Questions or concerns regarding consultations or procedures which the PT has had in the interim are  addressed. The PT denies any adverse reactions to current medications since the last visit.  Concerns as above Plans to commit to more time for herself , overwhelmed with caregiving for her son and a cousin who recently had a stroke ROS Denies recent fever or chills. Denies sinus pressure, nasal congestion, ear pain or sore throat. Denies chest congestion, productive cough or wheezing. Denies chest pains, palpitations and leg swelling Denies abdominal pain, nausea, vomiting,diarrhea or constipation.    Denies skin break down or rash.   PE  BP (!) 140/86   Pulse 94   Resp 16   Ht 5' 3 (1.6 m)   Wt 225 lb (102.1 kg)   SpO2 96%   BMI 39.86 kg/m   Patient alert and oriented and in no cardiopulmonary distress.  HEENT: No facial asymmetry, EOMI,     Neck supple .  Chest: Clear to auscultation bilaterally.  CVS: S1, S2 no murmurs, no S3.Regular rate.  ABD: Soft non tender.   Ext: No edema  MS: Adequate ROM spine, shoulders, hips and knees.  Skin: Intact, no ulcerations or rash noted.  Psych: Good eye contact, normal affect. Memory intact not anxious or depressed appearing.  CNS: CN 2-12 intact, power,  normal throughout.no focal deficits noted.   Assessment & Plan  Vitamin D   deficiency Updated lab needs to commit to daily vit D3, current level is 29.3   IGT (impaired glucose tolerance) Patient educated about the importance of limiting  Carbohydrate intake , the need to commit to daily physical activity for a minimum of 30 minutes , and to commit weight loss. The fact that changes in all these areas will reduce or eliminate all together the development of diabetes is stressed.      Latest Ref Rng & Units 08/18/2024   11:34 AM 03/11/2024    1:05 PM 06/09/2023    2:00 PM 03/26/2023    3:14 PM 01/22/2023    4:28 PM  Diabetic Labs  HbA1c 4.8 - 5.6 % 5.6  5.7      Chol 100 - 199 mg/dL 787  794    754   HDL >60 mg/dL 52  50    56   Calc LDL 0 - 99 mg/dL 855  856    824   Triglycerides 0 - 149 mg/dL 88  64    84   Creatinine 0.57 - 1.00 mg/dL 9.14  9.17  9.16  9.09  0.89       08/18/2024   11:23 AM 08/18/2024   11:00 AM 06/21/2024    3:54 PM 05/03/2024    4:06 PM 04/24/2024    7:00 PM 04/24/2024    6:57 PM 03/29/2024    4:11  PM  BP/Weight  Systolic BP 140 141 -- -- 128  116  Diastolic BP 86 97 -- -- 77  82  Wt. (Lbs)  225    208   BMI  39.86 kg/m2    36.85 kg/m2        No data to display         Improved since last checked   Hyperlipidemia Hyperlipidemia:Low fat diet discussed and encouraged.   Lipid Panel  Lab Results  Component Value Date   CHOL 212 (H) 08/18/2024   HDL 52 08/18/2024   LDLCALC 144 (H) 08/18/2024   TRIG 88 08/18/2024   CHOLHDL 4.1 08/18/2024     Needs to reduce fat intake and commit to taking medication prescribed  Hypertension DASH diet and commitment to daily physical activity for a minimum of 30 minutes discussed and encouraged, as a part of hypertension management. The importance of attaining a healthy weight is also discussed. Iuncontrolled , compliance needs tro be determined needs BP controlled , will send message     08/18/2024   11:23 AM 08/18/2024   11:00 AM 06/21/2024    3:54 PM 05/03/2024    4:06 PM  04/24/2024    7:00 PM 04/24/2024    6:57 PM 03/29/2024    4:11 PM  BP/Weight  Systolic BP 140 141 -- -- 128  116  Diastolic BP 86 97 -- -- 77  82  Wt. (Lbs)  225    208   BMI  39.86 kg/m2    36.85 kg/m2        Constipation Reports little to no response to medication prescribed and has not been ablke to et new med due to insurance not covering Will eat food that promotes BM , greens, regularly and commit to sennaS and stool softeners daily in the interim  Urinary retention Reports difficulty voiding urine with retention, urology eval needed  Morbid obesity due to excess calories [facility])  Patient re-educated about  the importance of commitment to a  minimum of 150 minutes of exercise per week as able.  The importance of healthy food choices with portion control discussed, as well as eating regularly and within a 12 hour window most days. The need to choose clean , green food 50 to 75% of the time is discussed, as well as to make water the primary drink and set a goal of 64 ounces water daily.       08/18/2024   11:00 AM 04/24/2024    6:57 PM 03/16/2024    8:23 AM  Weight /BMI  Weight 225 lb 208 lb 212 lb  Height 5' 3 (1.6 m) 5' 3 (1.6 m)   BMI 39.86 kg/m2 36.85 kg/m2 37.55 kg/m2    Deteriorated committed lifestyle change planned  "

## 2024-08-22 NOTE — Assessment & Plan Note (Addendum)
 Hyperlipidemia:Low fat diet discussed and encouraged.   Lipid Panel  Lab Results  Component Value Date   CHOL 212 (H) 08/18/2024   HDL 52 08/18/2024   LDLCALC 144 (H) 08/18/2024   TRIG 88 08/18/2024   CHOLHDL 4.1 08/18/2024     Needs to reduce fat intake and commit to taking medication prescribed

## 2024-08-22 NOTE — Assessment & Plan Note (Signed)
 Patient educated about the importance of limiting  Carbohydrate intake , the need to commit to daily physical activity for a minimum of 30 minutes , and to commit weight loss. The fact that changes in all these areas will reduce or eliminate all together the development of diabetes is stressed.      Latest Ref Rng & Units 08/18/2024   11:34 AM 03/11/2024    1:05 PM 06/09/2023    2:00 PM 03/26/2023    3:14 PM 01/22/2023    4:28 PM  Diabetic Labs  HbA1c 4.8 - 5.6 % 5.6  5.7      Chol 100 - 199 mg/dL 787  794    754   HDL >60 mg/dL 52  50    56   Calc LDL 0 - 99 mg/dL 855  856    824   Triglycerides 0 - 149 mg/dL 88  64    84   Creatinine 0.57 - 1.00 mg/dL 9.14  9.17  9.16  9.09  0.89       08/18/2024   11:23 AM 08/18/2024   11:00 AM 06/21/2024    3:54 PM 05/03/2024    4:06 PM 04/24/2024    7:00 PM 04/24/2024    6:57 PM 03/29/2024    4:11 PM  BP/Weight  Systolic BP 140 141 -- -- 128  116  Diastolic BP 86 97 -- -- 77  82  Wt. (Lbs)  225    208   BMI  39.86 kg/m2    36.85 kg/m2        No data to display         Improved since last checked

## 2024-08-22 NOTE — Assessment & Plan Note (Signed)
" °  Patient re-educated about  the importance of commitment to a  minimum of 150 minutes of exercise per week as able.  The importance of healthy food choices with portion control discussed, as well as eating regularly and within a 12 hour window most days. The need to choose clean , green food 50 to 75% of the time is discussed, as well as to make water the primary drink and set a goal of 64 ounces water daily.       08/18/2024   11:00 AM 04/24/2024    6:57 PM 03/16/2024    8:23 AM  Weight /BMI  Weight 225 lb 208 lb 212 lb  Height 5' 3 (1.6 m) 5' 3 (1.6 m)   BMI 39.86 kg/m2 36.85 kg/m2 37.55 kg/m2    Deteriorated committed lifestyle change planned "

## 2024-08-22 NOTE — Assessment & Plan Note (Signed)
 Reports difficulty voiding urine with retention, urology eval needed

## 2024-08-22 NOTE — Assessment & Plan Note (Signed)
 Reports little to no response to medication prescribed and has not been ablke to et new med due to insurance not covering Will eat food that promotes BM , greens, regularly and commit to sennaS and stool softeners daily in the interim

## 2024-08-23 ENCOUNTER — Telehealth: Admitting: Licensed Clinical Social Worker
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
## 2024-08-29 ENCOUNTER — Ambulatory Visit: Admitting: Gastroenterology

## 2024-08-31 ENCOUNTER — Encounter: Payer: Self-pay | Admitting: Gastroenterology

## 2024-08-31 ENCOUNTER — Ambulatory Visit: Admitting: Family Medicine

## 2024-08-31 ENCOUNTER — Ambulatory Visit: Payer: Self-pay

## 2024-08-31 ENCOUNTER — Telehealth: Payer: Self-pay | Admitting: Family Medicine

## 2024-08-31 DIAGNOSIS — R195 Other fecal abnormalities: Secondary | ICD-10-CM | POA: Diagnosis not present

## 2024-08-31 DIAGNOSIS — J020 Streptococcal pharyngitis: Secondary | ICD-10-CM | POA: Diagnosis not present

## 2024-08-31 LAB — POCT RAPID STREP A (OFFICE): Rapid Strep A Screen: POSITIVE — AB

## 2024-08-31 MED ORDER — AMOXICILLIN-POT CLAVULANATE 875-125 MG PO TABS: 1.0000 | ORAL_TABLET | Freq: Two times a day (BID) | ORAL | 0 refills | Status: AC

## 2024-08-31 NOTE — Patient Instructions (Signed)
 Stool test ordered.  Please get your supplies and instructions from [facility].  Antibiotic has been sent in for strep pharyngitis.  Placed referral to GI for further evaluation of mucus in stool.

## 2024-08-31 NOTE — Assessment & Plan Note (Addendum)
 Rapid strep positive today.  Treating with Augmentin.

## 2024-08-31 NOTE — Telephone Encounter (Signed)
 noted

## 2024-08-31 NOTE — Telephone Encounter (Signed)
 Copied from CRM [id number]. Topic: General - Running Late >> Aug 31, 2024  2:32 PM Victoria B wrote: Patient running late but says will be in by 2:40

## 2024-08-31 NOTE — Assessment & Plan Note (Signed)
 GI profile for further evaluation.  Referring to GI.  Will likely need colonoscopy to evaluate for potential underlying etiologies like inflammatory bowel disease.

## 2024-08-31 NOTE — Progress Notes (Signed)
 "  Subjective:  Patient ID: Martha Montgomery, female    DOB: 24-Jul-1981  Age: 44 y.o. MRN: [id number]  CC:   Chief Complaint  Patient presents with   Stool Color Change    Patient is here for experiencing green mucus color stool.  Patient has a history of abdominal and rectal issues since she was around 44 years old  Patient is also experiencing redness in the left eye , with a slight cough with mucus     HPI:  44 year old female presents with multiple complaints.  Patient reports a 2-day history of sore throat.  She has had some runny nose as well.  She has also noticed that over the past 5 days she has had left eye redness.  No discharge or crusting.  She states that it does not seem to be consistent with conjunctivitis.  Patient states that her most pressing concern is GI issues.  She reports ongoing bloating and constipation.  She states that she has had mucus in her stool.  She actually brought in a sample of her stool for me to examine.  She reports hard and mucousy stool.  She states that she has had some swelling in the rectal area.  Denies rectal pain.  She states that she has had a prior perirectal abscess.  She is concerned that this may be the culprit.  She states that she feels like she has an infection.  Patient reports a longstanding history of GI issues.  Last colonoscopy was in 2019.  Patient Active Problem List   Diagnosis Date Noted   Mucus in stool 08/31/2024   Strep throat 08/31/2024   Vitamin D  deficiency 08/22/2024   Iron deficiency anemia 03/21/2024   Chronic pain 03/13/2024   IGT (impaired glucose tolerance) 03/13/2024   Reduced vision 03/13/2024   Thrombocytosis 12/02/2023   GAD (generalized anxiety disorder) 04/20/2023   Mild depression 04/20/2023   Depression, major, single episode, mild 04/20/2023   Encounter for surveillance of injectable contraceptive 11/07/2022   Hyperlipidemia 06/08/2022   Hypertension    Morbid obesity due to excess calories (HCC)  01/08/2019   Hemorrhoids 05/19/2018   Encounter for contraceptive management 03/20/2018   Low vitamin D  level 11/30/2017   Post-surgical hypothyroidism 06/02/2014   Patellar malalignment syndrome 03/09/2012   IBS (irritable bowel syndrome) 08/29/2011   GERD (gastroesophageal reflux disease) 08/29/2011   Migraine headache 04/02/2010   Allergic rhinitis 06/16/2009    Social Hx   Social History   Socioeconomic History   Marital status: Legally Separated    Spouse name: Not on file   Number of children: 1   Years of education: 12   Highest education level: 12th grade  Occupational History   Occupation: Banker of Human Resources Officer: UNEMPLOYED  Tobacco Use   Smoking status: Former    Current packs/day: 0.00    Average packs/day: 1 pack/day for 5.0 years (5.0 ttl pk-yrs)    Types: Cigarettes    Start date: 09/11/2005    Quit date: 09/11/2010    Years since quitting: 13.9   Smokeless tobacco: Never   Tobacco comments:    QUIT SMOKING X 3 YEARS AGO,was vaping but has since quit completely  Vaping Use   Vaping status: Former   Quit date: 04/28/2023  Substance and Sexual Activity   Alcohol use: No   Drug use: No   Sexual activity: Not Currently    Partners: Male    Birth control/protection: Other-see comments,  Injection    Comment: Depo  Other Topics Concern   Not on file  Social History Narrative   Pt is currently on medicaid. From [place] and came to [place] in mid 2010   Is separated from Husband, lives with Mother right now    Social Drivers of Health   Tobacco Use: Medium Risk (08/22/2024)   Patient History    Smoking Tobacco Use: Former    Smokeless Tobacco Use: Never    Passive Exposure: Not on file  Financial Resource Strain: Medium Risk (03/01/2024)   Overall Financial Resource Strain (CARDIA)    Difficulty of Paying Living Expenses: Somewhat hard  Food Insecurity: Food Insecurity Present (07/19/2024)   Epic    Worried About Community Education Officer in the Last Year: Sometimes true    Ran Out of Food in the Last Year: Sometimes true  Transportation Needs: No Transportation Needs (03/16/2024)   Epic    Lack of Transportation (Medical): No    Lack of Transportation (Non-Medical): No  Physical Activity: Insufficiently Active (05/03/2024)   Exercise Vital Sign    Days of Exercise per Week: 3 days    Minutes of Exercise per Session: 20 min  Stress: Stress Concern Present (08/23/2024)   [employer] of Occupational Health - Occupational Stress Questionnaire    Feeling of Stress: To some extent  Social Connections: Unknown (03/01/2024)   Social Connection and Isolation Panel    Frequency of Communication with Friends and Family: More than three times a week    Frequency of Social Gatherings with Friends and Family: More than three times a week    Attends Religious Services: More than 4 times per year    Active Member of Clubs or Organizations: Yes    Attends Banker Meetings: More than 4 times per year    Marital Status: Patient declined  Depression (PHQ2-9): Medium Risk (08/23/2024)   Depression (PHQ2-9)    PHQ-2 Score: 8  Alcohol Screen: Low Risk (03/01/2024)   Alcohol Screen    Last Alcohol Screening Score (AUDIT): 0  Housing: Low Risk (03/16/2024)   Epic    Unable to Pay for Housing in the Last Year: No    Number of Times Moved in the Last Year: 0    Homeless in the Last Year: No  Utilities: Not At Risk (03/16/2024)   Epic    Threatened with loss of utilities: No  Health Literacy: Adequate Health Literacy (03/01/2024)   B1300 Health Literacy    Frequency of need for help with medical instructions: Never    Review of Systems Per HPI  Objective:  BP 135/85 (BP Location: Left Arm, Patient Position: Sitting)   Pulse 95   Temp 98.2 F (36.8 C)   Ht 5' 3 (1.6 m)   Wt 228 lb (103.4 kg)   SpO2 99%   BMI 40.39 kg/m      08/31/2024    2:52 PM 08/23/2024   11:38 AM 08/18/2024   11:23 AM  BP/Weight   Systolic BP 135 -- 140  Diastolic BP 85 -- 86  Wt. (Lbs) 228    BMI 40.39 kg/m2      Physical Exam Vitals and nursing note reviewed. Exam conducted with a chaperone present.  Constitutional:      General: She is not in acute distress.    Appearance: She is obese.  HENT:     Head: Normocephalic and atraumatic.     Mouth/Throat:     Pharynx: Posterior oropharyngeal  erythema present.  Eyes:     Comments: Left eye with mild conjunctival injection.  No discharge.  Abdominal:     General: There is no distension.     Palpations: Abdomen is soft.     Tenderness: There is no abdominal tenderness.  Genitourinary:    Comments: No appreciable perirectal swelling or erythema. Neurological:     Mental Status: She is alert.     Lab Results  Component Value Date   WBC 5.6 08/18/2024   HGB 13.3 08/18/2024   HCT 40.0 08/18/2024   PLT 472 (H) 08/18/2024   GLUCOSE 79 08/18/2024   CHOL 212 (H) 08/18/2024   TRIG 88 08/18/2024   HDL 52 08/18/2024   LDLCALC 144 (H) 08/18/2024   ALT 16 08/18/2024   AST 21 08/18/2024   NA 143 08/18/2024   K 3.8 08/18/2024   CL 104 08/18/2024   CREATININE 0.85 08/18/2024   BUN 9 08/18/2024   CO2 21 08/18/2024   TSH 2.650 08/18/2024   HGBA1C 5.6 08/18/2024     Assessment & Plan:  Mucus in stool Assessment & Plan: GI profile for further evaluation.  Referring to GI.  Will likely need colonoscopy to evaluate for potential underlying etiologies like inflammatory bowel disease.  Orders: -     Ambulatory referral to Gastroenterology -     GI Profile, Stool, PCR  Strep throat Assessment & Plan: Rapid strep positive today.  Treating with Augmentin .  Orders: -     POCT rapid strep A -     Amoxicillin -Pot Clavulanate; Take 1 tablet by mouth 2 (two) times daily.  Dispense: 20 tablet; Refill: 0    Follow-up: Needs follow-up with PCP.  Jacqulyn Ahle DO [facility] "

## 2024-08-31 NOTE — Telephone Encounter (Signed)
 FYI Only or Action Required?: FYI only for provider: appointment scheduled on 2/4.  Patient was last seen in primary care on 08/18/2024 by [name], MD.  Called Nurse Triage reporting Rectal Bleeding and Conjunctivitis.  Symptoms began several days ago.  Interventions attempted: Rest, hydration, or home remedies.  Symptoms are: gradually worsening.  Triage Disposition: See PCP When Office is Open (Within 3 Days)  Patient/caregiver understands and will follow disposition?: Yes  Message from Harlene ORN sent at 08/31/2024 12:42 PM EST  Reason for Triage: green mucus in her stool today; sometime blood in her stool and abdoiminal pain and inflammation in her gut. Blurry red vision in her left eye,   Reason for Disposition  MILD rectal bleeding (e.g., more than just a few drops or streaks)  Answer Assessment - Initial Assessment Questions Patient with chronic GI issues. Seen 1/22 for constipation and has since resolved with significant bowel clearing with X-Lax.   Calling today with green mucous coating stools. Has had blood coated stools recently due to internal hemorrhoids that are inflamed bc of the constipation. Feels like her guts are inflamed and there is an infection. Abdominal discomfort is present chronically, mildly worse.  Denies CP, SOB, Fever, black stool, or sharp pains.   Chronic back pain- feels worse but like there is just more pressure    Also with red sclera in her left eye causing some mild blurry vision, mild itching. Denies pus. Eyelids may be swollen but barely perceptible. Denies injuring or scratching surface. Wears contact lens non prescription.   155/104- needs to take her BP med- higher than normal- could have caused the red sclera.   Appt with [facility] provider this afternoon to assess. Patient understands ED precautions   1. APPEARANCE of BLOOD: What color is it? Is it passed separately, on the surface of the stool, or mixed in with the stool?       Streaking the surface of her stool  2. AMOUNT: How much blood was passed?      Small amount- the last few days- not today  3. FREQUENCY: How many times has blood been passed with the stools?      A few times with internal hemorhoids 4. ONSET: When was the blood first seen in the stools? (Days or weeks)      Ongoing issues with internal hemorrhoids  5. DIARRHEA: Is there also some diarrhea? If Yes, ask: How many diarrhea stools in the past 24 hours?      denies 6. CONSTIPATION: Do you have constipation? If Yes, ask: How bad is it?     Was last week - laxative  7. RECURRENT SYMPTOMS: Have you had blood in your stools before? If Yes, ask: When was the last time? and What happened that time?      Chronic GI issues- 8. BLOOD THINNERS: Do you take any blood thinners? (e.g., aspirin , clopidogrel / Plavix, coumadin, heparin). Notes: Other strong blood thinners include: Arixtra (fondaparinux), Eliquis (apixaban), Pradaxa (dabigatran), and Xarelto (rivaroxaban).     denies 9. OTHER SYMPTOMS: Do you have any other symptoms?  (e.g., abdomen pain, vomiting, dizziness, fever)     Abdominal discomfort-  10. PREGNANCY: Is there any chance you are pregnant? When was your last menstrual period?       denies  Answer Assessment - Initial Assessment Questions 1. LOCATION: Location: What's red, the eyeball or the outer eyelids? (Note: when callers say the eye is red, they usually mean the sclera is red)  Left eye 2. REDNESS OF SCLERA: Is the redness in one or both eyes? When did the redness start?      Just left eye  3. ONSET: When did the eye become red? (e.g., hours, days)      4-5 days  4. EYELIDS: Are the eyelids red or swollen? If Yes, ask: How much?      Nothing significant  5. VISION: Is there any difficulty seeing clearly?      Slightly blurry 6. ITCHING: Does it feel itchy? If so ask: How bad is it (e.g., Scale 1-10; or mild, moderate, severe)      Slightly itching but not inside the eye 7. PAIN: Is there any pain? If Yes, ask: How bad is it? (e.g., Scale 1-10; or mild, moderate, severe)     denies 8. CONTACT LENS: Do you wear contacts?     Wears non-prescription lenses 9. CAUSE: What do you think is causing the redness?     Unsure  10. OTHER SYMPTOMS: Do you have any other symptoms? (e.g., fever, runny nose, cough, vomiting)       Runny nose- feels like she is trying to come down something. Cough- green phlegm. Hoarse voice.  Protocols used: Rectal Bleeding-A-AH, Eye - Red Without Pus-A-AH

## 2024-09-27 ENCOUNTER — Telehealth: Admitting: Licensed Clinical Social Worker

## 2024-10-04 ENCOUNTER — Ambulatory Visit: Admitting: Gastroenterology

## 2024-12-08 ENCOUNTER — Encounter: Payer: Self-pay | Admitting: Family Medicine

## 2024-12-30 ENCOUNTER — Ambulatory Visit (HOSPITAL_COMMUNITY)

## 2025-03-06 ENCOUNTER — Ambulatory Visit
